# Patient Record
Sex: Male | Born: 1935 | Race: White | Hispanic: No | Smoking: Never smoker
Health system: Southern US, Community
[De-identification: ages and names within clinical notes are randomized; demographics above are authoritative.]

## PROBLEM LIST (undated history)

## (undated) DIAGNOSIS — K219 Gastro-esophageal reflux disease without esophagitis: Secondary | ICD-10-CM

## (undated) DIAGNOSIS — N400 Enlarged prostate without lower urinary tract symptoms: Secondary | ICD-10-CM

## (undated) DIAGNOSIS — D649 Anemia, unspecified: Secondary | ICD-10-CM

## (undated) DIAGNOSIS — N289 Disorder of kidney and ureter, unspecified: Secondary | ICD-10-CM

## (undated) DIAGNOSIS — E785 Hyperlipidemia, unspecified: Secondary | ICD-10-CM

## (undated) DIAGNOSIS — E119 Type 2 diabetes mellitus without complications: Secondary | ICD-10-CM

## (undated) DIAGNOSIS — I447 Left bundle-branch block, unspecified: Secondary | ICD-10-CM

## (undated) DIAGNOSIS — M1990 Unspecified osteoarthritis, unspecified site: Secondary | ICD-10-CM

## (undated) DIAGNOSIS — S2239XA Fracture of one rib, unspecified side, initial encounter for closed fracture: Secondary | ICD-10-CM

## (undated) HISTORY — PX: EYE SURGERY: SHX253

## (undated) HISTORY — PX: OTHER SURGICAL HISTORY: SHX169

---

## 2009-09-28 ENCOUNTER — Ambulatory Visit (HOSPITAL_COMMUNITY): Admission: RE | Admit: 2009-09-28 | Discharge: 2009-09-28 | Payer: Self-pay | Admitting: Ophthalmology

## 2009-10-05 ENCOUNTER — Ambulatory Visit (HOSPITAL_COMMUNITY)
Admission: RE | Admit: 2009-10-05 | Discharge: 2009-10-05 | Payer: Self-pay | Source: Home / Self Care | Admitting: Ophthalmology

## 2010-05-19 LAB — BASIC METABOLIC PANEL
BUN: 16 mg/dL (ref 6–23)
CO2: 24 mEq/L (ref 19–32)
Glucose, Bld: 142 mg/dL — ABNORMAL HIGH (ref 70–99)
Potassium: 4.2 mEq/L (ref 3.5–5.1)
Sodium: 137 mEq/L (ref 135–145)

## 2011-04-29 DIAGNOSIS — S46909A Unspecified injury of unspecified muscle, fascia and tendon at shoulder and upper arm level, unspecified arm, initial encounter: Secondary | ICD-10-CM | POA: Diagnosis not present

## 2011-04-29 DIAGNOSIS — S4980XA Other specified injuries of shoulder and upper arm, unspecified arm, initial encounter: Secondary | ICD-10-CM | POA: Diagnosis not present

## 2011-05-15 DIAGNOSIS — M25519 Pain in unspecified shoulder: Secondary | ICD-10-CM | POA: Diagnosis not present

## 2011-06-29 DIAGNOSIS — L02519 Cutaneous abscess of unspecified hand: Secondary | ICD-10-CM | POA: Diagnosis not present

## 2011-06-29 DIAGNOSIS — M79609 Pain in unspecified limb: Secondary | ICD-10-CM | POA: Diagnosis not present

## 2011-07-30 DIAGNOSIS — M25569 Pain in unspecified knee: Secondary | ICD-10-CM | POA: Diagnosis not present

## 2011-07-30 DIAGNOSIS — M25519 Pain in unspecified shoulder: Secondary | ICD-10-CM | POA: Diagnosis not present

## 2011-08-05 ENCOUNTER — Ambulatory Visit
Admission: RE | Admit: 2011-08-05 | Discharge: 2011-08-05 | Disposition: A | Discharge status: Home / Self Care | Payer: Medicare Other | Source: Ambulatory Visit | Attending: Orthopaedic Surgery | Admitting: Orthopaedic Surgery

## 2011-08-05 ENCOUNTER — Other Ambulatory Visit: Payer: Self-pay | Admitting: Orthopaedic Surgery

## 2011-08-05 DIAGNOSIS — M25519 Pain in unspecified shoulder: Secondary | ICD-10-CM

## 2011-09-04 DIAGNOSIS — H5210 Myopia, unspecified eye: Secondary | ICD-10-CM | POA: Diagnosis not present

## 2011-09-04 DIAGNOSIS — Z961 Presence of intraocular lens: Secondary | ICD-10-CM | POA: Diagnosis not present

## 2011-09-04 DIAGNOSIS — H26499 Other secondary cataract, unspecified eye: Secondary | ICD-10-CM | POA: Diagnosis not present

## 2011-09-04 DIAGNOSIS — H52229 Regular astigmatism, unspecified eye: Secondary | ICD-10-CM | POA: Diagnosis not present

## 2011-09-16 DIAGNOSIS — E782 Mixed hyperlipidemia: Secondary | ICD-10-CM | POA: Diagnosis not present

## 2011-10-11 DIAGNOSIS — N529 Male erectile dysfunction, unspecified: Secondary | ICD-10-CM | POA: Diagnosis not present

## 2011-10-11 DIAGNOSIS — N411 Chronic prostatitis: Secondary | ICD-10-CM | POA: Diagnosis not present

## 2011-11-11 DIAGNOSIS — H26499 Other secondary cataract, unspecified eye: Secondary | ICD-10-CM | POA: Diagnosis not present

## 2012-01-08 DIAGNOSIS — Z23 Encounter for immunization: Secondary | ICD-10-CM | POA: Diagnosis not present

## 2012-03-16 ENCOUNTER — Emergency Department (HOSPITAL_COMMUNITY)
Admission: EM | Admit: 2012-03-16 | Discharge: 2012-03-16 | Disposition: A | Discharge status: Home / Self Care | Payer: Medicare Other | Attending: Emergency Medicine | Admitting: Emergency Medicine

## 2012-03-16 ENCOUNTER — Encounter (HOSPITAL_COMMUNITY): Payer: Self-pay | Admitting: *Deleted

## 2012-03-16 DIAGNOSIS — Z7982 Long term (current) use of aspirin: Secondary | ICD-10-CM | POA: Insufficient documentation

## 2012-03-16 DIAGNOSIS — Z79899 Other long term (current) drug therapy: Secondary | ICD-10-CM | POA: Insufficient documentation

## 2012-03-16 DIAGNOSIS — S61259A Open bite of unspecified finger without damage to nail, initial encounter: Secondary | ICD-10-CM

## 2012-03-16 DIAGNOSIS — IMO0001 Reserved for inherently not codable concepts without codable children: Secondary | ICD-10-CM | POA: Insufficient documentation

## 2012-03-16 DIAGNOSIS — S61209A Unspecified open wound of unspecified finger without damage to nail, initial encounter: Secondary | ICD-10-CM | POA: Insufficient documentation

## 2012-03-16 DIAGNOSIS — Y92009 Unspecified place in unspecified non-institutional (private) residence as the place of occurrence of the external cause: Secondary | ICD-10-CM | POA: Insufficient documentation

## 2012-03-16 DIAGNOSIS — Y939 Activity, unspecified: Secondary | ICD-10-CM | POA: Insufficient documentation

## 2012-03-16 MED ORDER — OXYCODONE-ACETAMINOPHEN 5-325 MG PO TABS
1.0000 | ORAL_TABLET | Freq: Once | ORAL | Status: AC
  Administered 2012-03-16: 1 via ORAL
  Filled 2012-03-16: qty 1

## 2012-03-16 MED ORDER — OXYCODONE-ACETAMINOPHEN 5-325 MG PO TABS: 2.0000 | ORAL_TABLET | ORAL | Status: DC | PRN

## 2012-03-16 MED ORDER — AMOXICILLIN-POT CLAVULANATE 875-125 MG PO TABS: 1.0000 | ORAL_TABLET | Freq: Two times a day (BID) | ORAL | Status: DC

## 2012-03-16 MED ORDER — HYDROCODONE-ACETAMINOPHEN 5-325 MG PO TABS
1.0000 | ORAL_TABLET | Freq: Once | ORAL | Status: AC
  Administered 2012-03-16: 1 via ORAL

## 2012-03-16 MED ORDER — LIDOCAINE HCL (PF) 2 % IJ SOLN
INTRAMUSCULAR | Status: AC
  Administered 2012-03-16: 15:00:00
  Filled 2012-03-16: qty 10

## 2012-03-16 MED ORDER — LIDOCAINE HCL (PF) 2 % IJ SOLN
INTRAMUSCULAR | Status: AC
  Filled 2012-03-16: qty 10

## 2012-03-16 MED ORDER — AMOXICILLIN-POT CLAVULANATE 875-125 MG PO TABS
1.0000 | ORAL_TABLET | Freq: Once | ORAL | Status: AC
  Administered 2012-03-16: 1 via ORAL
  Filled 2012-03-16: qty 1

## 2012-03-16 MED ORDER — HYDROCODONE-ACETAMINOPHEN 5-325 MG PO TABS
ORAL_TABLET | ORAL | Status: AC
  Administered 2012-03-16: 1 via ORAL
  Filled 2012-03-16: qty 1

## 2012-03-16 NOTE — ED Notes (Signed)
[facility] called and stated to have pt call them when he gets home. Pt aware.

## 2012-03-16 NOTE — ED Provider Notes (Signed)
History  This chart was scribed for Donnetta Hutching, MD by Ardeen Jourdain, ED Scribe. This patient was seen in room APA03/APA03 and the patient's care was started at 1307.  CSN: [id number]  Arrival date & time 03/16/12  1136   First MD Initiated Contact with Patient 03/16/12 1307      Chief Complaint  Patient presents with  . Animal Bite     The history is provided by the patient. No language interpreter was used.    Angel Norman is a 79 y.o. male who presents to the Emergency Department complaining of a laceration to his left 5th finger. He states his pet racoon bit him last night. He states his tetanus shot is up to date. He denies any other symptoms at this time. He states the [place] is not feral.      History reviewed. No pertinent past medical history.  History reviewed. No pertinent past surgical history.  No family history on file.  History  Substance Use Topics  . Smoking status: Never Smoker   . Smokeless tobacco: Not on file  . Alcohol Use: No      Review of Systems  Skin: Positive for wound.  All other systems reviewed and are negative.    A complete 10 system review of systems was obtained and all systems are negative except as noted in the HPI and PMH.    Allergies  Review of patient's allergies indicates no known allergies.  Home Medications   Current Outpatient Rx  Name  Route  Sig  Dispense  Refill  . ASPIRIN EC 81 MG PO TBEC   Oral   Take 81 mg by mouth daily.         Marland Kitchen FAMOTIDINE 20 MG PO TABS   Oral   Take 20 mg by mouth 2 (two) times daily.         Marland Kitchen NAPROXEN 500 MG PO TBEC   Oral   Take 500 mg by mouth 2 (two) times daily with a meal.         . PRAVASTATIN SODIUM 40 MG PO TABS   Oral   Take 40 mg by mouth daily.         Marland Kitchen TAMSULOSIN HCL 0.4 MG PO CAPS   Oral   Take 0.4 mg by mouth daily.           Triage Vitals: BP 165/80  Pulse 81  Temp 97.6 F (36.4 C) (Oral)  Resp 20  Ht 6\' 3"  (1.905 m)  Wt 307 lb 6 oz  (139.424 kg)  BMI 38.42 kg/m2  SpO2 96%  Physical Exam  Nursing note and vitals reviewed. Constitutional: He is oriented to person, place, and time. He appears well-developed and well-nourished.  HENT:  Head: Normocephalic and atraumatic.  Eyes: Conjunctivae normal and EOM are normal. Pupils are equal, round, and reactive to light.  Neck: Normal range of motion. Neck supple.  Cardiovascular: Normal rate, regular rhythm and normal heart sounds.   Pulmonary/Chest: Effort normal and breath sounds normal.  Abdominal: Soft. Bowel sounds are normal.  Musculoskeletal: Normal range of motion.  Neurological: He is alert and oriented to person, place, and time.  Skin: Skin is warm and dry.       3 cm elliptical flap to the palmar aspect of the distal phalanx   Psychiatric: He has a normal mood and affect.     ED Course  Procedures (including critical care time)  DIAGNOSTIC STUDIES: Oxygen Saturation is 96%  on room air, adequate by my interpretation.    COORDINATION OF CARE:  1:22 PM: Discussed treatment plan which includes numbing medication and a laceration repair with pt at bedside and pt agreed to plan.    PROCEDURE:  [1]  Digital block of left 5th finger with 2% Xylocaine x8 cc  [2]  LACERATION REPAIR PROCEDURE NOTE The patient's identification was confirmed and consent was obtained. This procedure was performed by Donnetta Hutching, MD at 2:29 PM. Site: Palmer aspect of distal phalanx  Sterile procedures observed Anesthetic used (type and amt): digital block [see above] Suture type/size: 4-0 prolene Length: 3 cm # of Sutures: 4 Technique:interrupted Complexity  complex Antibx ointment applied Tetanus UTD  Site anesthetized, irrigated with NS, explored without evidence of foreign body, wound well approximated, site covered with dry, sterile dressing.  Patient tolerated procedure well without complications. Instructions for care discussed verbally and patient provided  with additional written instructions for homecare and f/u.   Labs Reviewed - No data to display No results found.   No diagnosis found.    MDM  Digital block and laceration repair as above. Will followup with hand surgery following day in [place]      I personally performed the services described in this documentation, which was scribed in my presence. The recorded information has been reviewed and is accurate.    Donnetta Hutching, MD 03/19/12 [phone]

## 2012-03-16 NOTE — ED Notes (Signed)
Pt states pet raccoon bit his 5th finger tip pad today, states tetanus shot up to date, states that raccoon has not had rabies shots, occurred at home per pt-lives on [address] in [place], states raccoon stays in a cage,

## 2012-03-16 NOTE — ED Notes (Signed)
PA aware pt wanting pain med. vo one tab hydrocodone po. Read back and verified.

## 2012-03-16 NOTE — ED Notes (Signed)
Pt states raccoon bit to left 5th finger. Lac to finger. States raccoon has been raised by them since it was a baby and is caged, usually able to pet but states it is mating season.

## 2012-03-19 DIAGNOSIS — S61409A Unspecified open wound of unspecified hand, initial encounter: Secondary | ICD-10-CM | POA: Diagnosis not present

## 2012-04-08 DIAGNOSIS — Z23 Encounter for immunization: Secondary | ICD-10-CM | POA: Diagnosis not present

## 2012-04-08 DIAGNOSIS — E782 Mixed hyperlipidemia: Secondary | ICD-10-CM | POA: Diagnosis not present

## 2012-06-30 DIAGNOSIS — M1710 Unilateral primary osteoarthritis, unspecified knee: Secondary | ICD-10-CM | POA: Diagnosis not present

## 2012-07-06 DIAGNOSIS — H6120 Impacted cerumen, unspecified ear: Secondary | ICD-10-CM | POA: Diagnosis not present

## 2012-07-22 DIAGNOSIS — M1710 Unilateral primary osteoarthritis, unspecified knee: Secondary | ICD-10-CM | POA: Diagnosis not present

## 2012-08-03 DIAGNOSIS — M1710 Unilateral primary osteoarthritis, unspecified knee: Secondary | ICD-10-CM | POA: Diagnosis not present

## 2012-08-10 ENCOUNTER — Other Ambulatory Visit (HOSPITAL_COMMUNITY): Payer: Self-pay | Admitting: Orthopedic Surgery

## 2012-08-12 ENCOUNTER — Encounter (HOSPITAL_COMMUNITY): Payer: Self-pay | Admitting: Pharmacy Technician

## 2012-08-17 NOTE — Pre-Procedure Instructions (Signed)
Trejuan Matherne [facility]  08/17/2012   Your procedure is scheduled on:  Friday, June 18th   Report to [facility] Short Stay Center at 6:30 AM.              Bonita Quin will come through Entrance "A", follow signs to [facility] and take those to 3rd floor.   Call this number if you have problems the morning of surgery: [phone]   Remember:   Do not eat food or drink liquids after midnight Thursday.   Take these medicines the morning of surgery with A SIP OF WATER: Pepcid, Robaxin, Oxycodone, Flomax   Do not wear jewelry/  Do not wear lotions, powders, or colognes. You may NOT wear deodorant.   Men may shave face and neck.   Do not bring valuables to the hospital.  [facility] is not responsible for any belongings or valuables.  Contacts, dentures or bridgework may not be worn into surgery.   Leave suitcase in the car. After surgery it may be brought to your room.  For patients admitted to the hospital, checkout time is 11:00 AM the day of discharge.   Name and phone number of your driver:                  Special Instructions: Shower using CHG 2 nights before surgery and the night before surgery.  If you shower the day of surgery use CHG.  Use special wash - you have one bottle of CHG for all showers.  You should use approximately 1/3 of the bottle for each shower.   Please read over the following fact sheets that you were given: Pain Booklet, MRSA Information and Surgical Site Infection Prevention

## 2012-08-18 ENCOUNTER — Encounter (HOSPITAL_COMMUNITY): Payer: Self-pay

## 2012-08-18 ENCOUNTER — Encounter (HOSPITAL_COMMUNITY)
Admission: RE | Admit: 2012-08-18 | Discharge: 2012-08-18 | Disposition: A | Discharge status: Home / Self Care | Payer: Medicare Other | Source: Ambulatory Visit | Attending: Orthopedic Surgery | Admitting: Orthopedic Surgery

## 2012-08-18 DIAGNOSIS — Z6838 Body mass index (BMI) 38.0-38.9, adult: Secondary | ICD-10-CM | POA: Diagnosis not present

## 2012-08-18 DIAGNOSIS — G8918 Other acute postprocedural pain: Secondary | ICD-10-CM | POA: Diagnosis not present

## 2012-08-18 DIAGNOSIS — K219 Gastro-esophageal reflux disease without esophagitis: Secondary | ICD-10-CM | POA: Diagnosis present

## 2012-08-18 DIAGNOSIS — Z01810 Encounter for preprocedural cardiovascular examination: Secondary | ICD-10-CM | POA: Diagnosis not present

## 2012-08-18 DIAGNOSIS — M1710 Unilateral primary osteoarthritis, unspecified knee: Secondary | ICD-10-CM | POA: Diagnosis not present

## 2012-08-18 DIAGNOSIS — E785 Hyperlipidemia, unspecified: Secondary | ICD-10-CM | POA: Diagnosis present

## 2012-08-18 DIAGNOSIS — Z01818 Encounter for other preprocedural examination: Secondary | ICD-10-CM | POA: Diagnosis not present

## 2012-08-18 DIAGNOSIS — Z01812 Encounter for preprocedural laboratory examination: Secondary | ICD-10-CM | POA: Diagnosis not present

## 2012-08-18 HISTORY — DX: Unspecified osteoarthritis, unspecified site: M19.90

## 2012-08-18 HISTORY — PX: TOTAL KNEE ARTHROPLASTY: SHX125

## 2012-08-18 HISTORY — DX: Gastro-esophageal reflux disease without esophagitis: K21.9

## 2012-08-18 HISTORY — DX: Hyperlipidemia, unspecified: E78.5

## 2012-08-18 LAB — CBC
HCT: 38.4 % — ABNORMAL LOW (ref 39.0–52.0)
Hemoglobin: 12.3 g/dL — ABNORMAL LOW (ref 13.0–17.0)
MCH: 26.4 pg (ref 26.0–34.0)
MCHC: 32 g/dL (ref 30.0–36.0)
MCV: 82.4 fL (ref 78.0–100.0)

## 2012-08-18 LAB — COMPREHENSIVE METABOLIC PANEL
Alkaline Phosphatase: 80 U/L (ref 39–117)
BUN: 22 mg/dL (ref 6–23)
Calcium: 9 mg/dL (ref 8.4–10.5)
GFR calc Af Amer: 64 mL/min — ABNORMAL LOW (ref >90)
Glucose, Bld: 105 mg/dL — ABNORMAL HIGH (ref 70–99)
Total Protein: 7.3 g/dL (ref 6.0–8.3)

## 2012-08-18 LAB — SURGICAL PCR SCREEN
MRSA, PCR: NEGATIVE
Staphylococcus aureus: NEGATIVE

## 2012-08-18 LAB — TYPE AND SCREEN
ABO/RH(D): O POS
Antibody Screen: NEGATIVE

## 2012-08-18 LAB — PROTIME-INR: Prothrombin Time: 13.2 seconds (ref 11.6–15.2)

## 2012-08-18 LAB — APTT: aPTT: 30 seconds (ref 24–37)

## 2012-08-18 LAB — ABO/RH: ABO/RH(D): O POS

## 2012-08-18 IMAGING — CR DG CHEST 2V
2 series · 2 of 2 positions shown · non-contrast
Comparison: None.

CLINICAL DATA: Preop left knee replacement

CHEST - 2 VIEW

[view not recorded (1 of 2)]
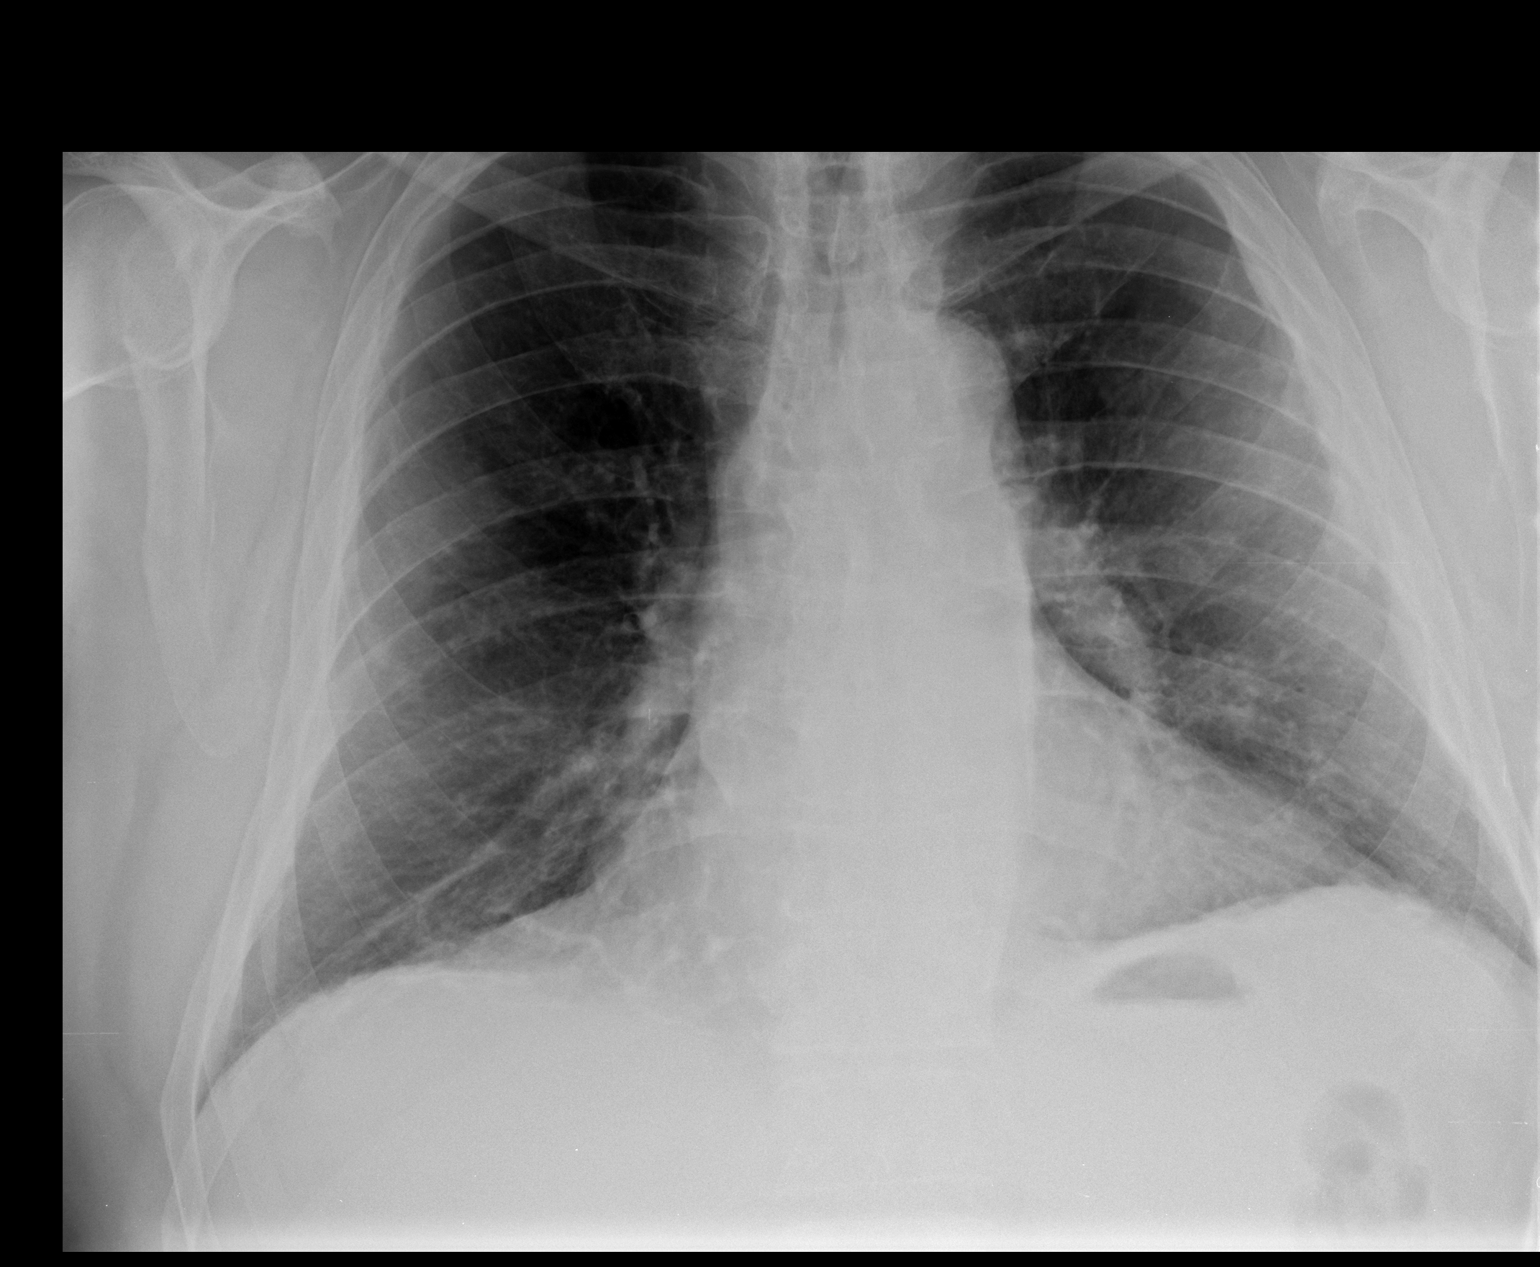

[view not recorded (2 of 2)]
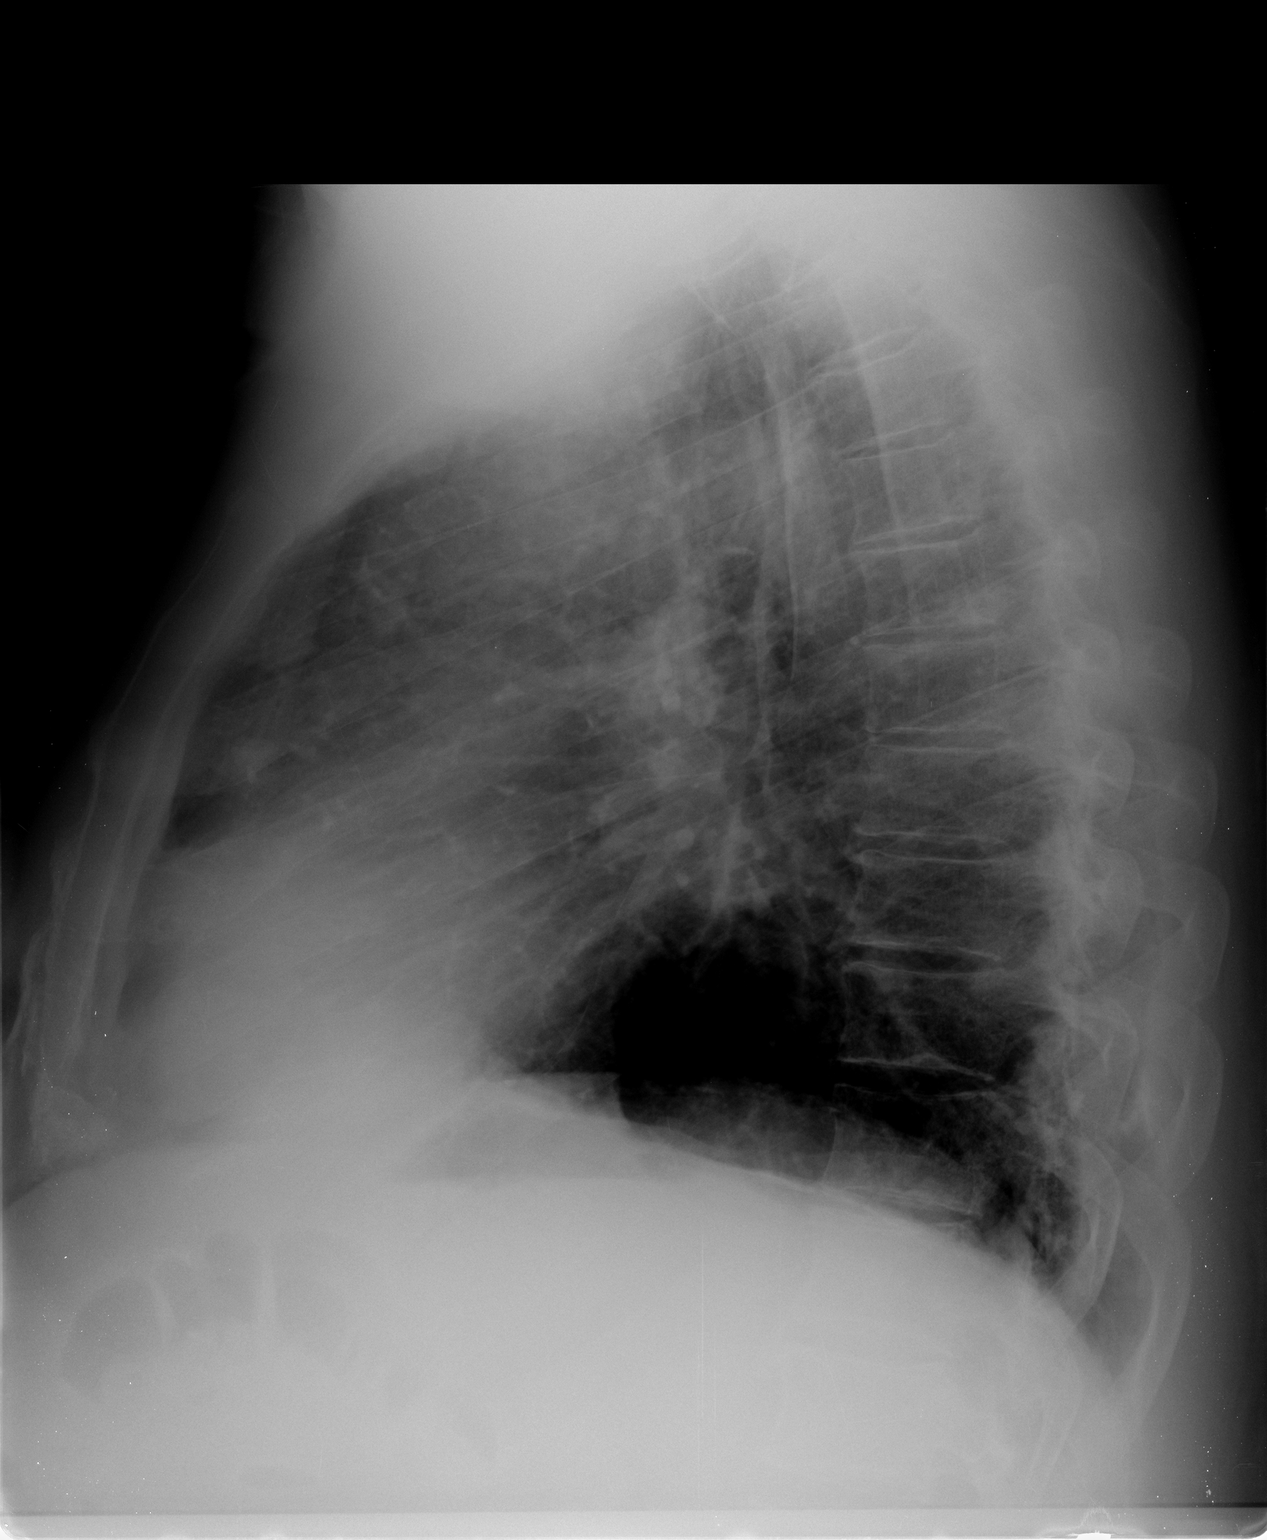

[2 of 2 positions shown; findings below may reference images not displayed]

FINDINGS: The heart size and vascular pattern are normal.  The
lungs are clear.  There is no evidence of consolidation or pleural
effusion.
IMPRESSION: No acute abnormalities

## 2012-08-18 MED ORDER — CEFAZOLIN SODIUM 10 G IJ SOLR
3.0000 g | INTRAMUSCULAR | Status: AC
  Administered 2012-08-19: 3 g via INTRAVENOUS
  Filled 2012-08-18: qty 3000

## 2012-08-18 NOTE — Pre-Procedure Instructions (Signed)
Tallie Hevia [facility]  08/18/2012   Your procedure is scheduled on:  Wednesday, June 18th   Report to [facility] Short Stay Center at 6:30 AM.              Bonita Quin will come through Entrance "A", follow signs to [facility] and take those to 3rd floor.   Call this number if you have problems the morning of surgery: [phone]   Remember:   Do not eat food or drink liquids after midnight Thursday.   Take these medicines the morning of surgery with A SIP OF WATER: Famotidine (Pepcid), Oxycodone (Percocet) if needed for pain, and Tamsulosin (Flomax)   Do not wear jewelry  Do not wear lotions, powders, or colognes. You may NOT wear deodorant.   Men may shave face and neck.  Do not bring valuables to the hospital.  [facility] is not responsible for any belongings or  valuables.  Contacts, dentures or bridgework may not be worn into surgery.  Leave suitcase in the car. After surgery it may be brought to your room.  For patients admitted to the hospital, checkout time is 11:00 AM the day of discharge.   Name and phone number of your driver:                  Special Instructions: Shower using CHG tonight and tomorrow morning before surgery.   Use special wash - you have one bottle of CHG for all showers.  You should use approximately 1/3 of the bottle for each shower.   Please read over the following fact sheets that you were given: Pain Booklet, MRSA Information and Surgical Site Infection Prevention

## 2012-08-18 NOTE — Progress Notes (Signed)
08/18/12 1404  OBSTRUCTIVE SLEEP APNEA  Have you ever been diagnosed with sleep apnea through a sleep study? No  Do you snore loudly (loud enough to be heard through closed doors)?  0  Do you often feel tired, fatigued, or sleepy during the daytime? 0  Has anyone observed you stop breathing during your sleep? 0  Do you have, or are you being treated for high blood pressure? 0  BMI more than 35 kg/m2? 1  Age over 77 years old? 1  Neck circumference greater than 40 cm/18 inches? 1  Gender: 1  Obstructive Sleep Apnea Score 4  Score 4 or greater  Results sent to PCP

## 2012-08-18 NOTE — Progress Notes (Signed)
Primary physician - dr. Philipp Deputy No recent cardiac testing - no cardiologist

## 2012-08-18 NOTE — Pre-Procedure Instructions (Signed)
Angel Norman [facility]  08/18/2012   Your procedure is scheduled on:  Friday, June 18th   Report to [facility] Short Stay Center at 6:30 AM.              Bonita Quin will come through Entrance "A", follow signs to [facility] and take those to 3rd floor.   Call this number if you have problems the morning of surgery: [phone]   Remember:   Do not eat food or drink liquids after midnight Thursday.   Take these medicines the morning of surgery with A SIP OF WATER: Famotidine (Pepcid), Oxycodone (Percocet) if needed for pain, and Tamsulosin (Flomax)   Do not wear jewelry  Do not wear lotions, powders, or colognes. You may NOT wear deodorant.   Men may shave face and neck.  Do not bring valuables to the hospital.  [facility] is not responsible for any belongings or  valuables.  Contacts, dentures or bridgework may not be worn into surgery.  Leave suitcase in the car. After surgery it may be brought to your room.  For patients admitted to the hospital, checkout time is 11:00 AM the day of discharge.   Name and phone number of your driver:                  Special Instructions: Shower using CHG tonight and tomorrow morning before surgery.   Use special wash - you have one bottle of CHG for all showers.  You should use approximately 1/3 of the bottle for each shower.   Please read over the following fact sheets that you were given: Pain Booklet, MRSA Information and Surgical Site Infection Prevention

## 2012-08-19 ENCOUNTER — Encounter (HOSPITAL_COMMUNITY): Payer: Self-pay | Admitting: *Deleted

## 2012-08-19 ENCOUNTER — Inpatient Hospital Stay (HOSPITAL_COMMUNITY): Payer: Medicare Other | Admitting: Certified Registered"

## 2012-08-19 ENCOUNTER — Inpatient Hospital Stay (HOSPITAL_COMMUNITY)
Admission: RE | Admit: 2012-08-19 | Discharge: 2012-08-22 | DRG: 470 | Disposition: A | Discharge status: Home Health | Payer: Medicare Other | Source: Ambulatory Visit | Attending: Orthopedic Surgery | Admitting: Orthopedic Surgery

## 2012-08-19 ENCOUNTER — Encounter (HOSPITAL_COMMUNITY)
Admission: RE | Disposition: A | Discharge status: Home Health | Payer: Self-pay | Source: Ambulatory Visit | Attending: Orthopedic Surgery

## 2012-08-19 ENCOUNTER — Encounter (HOSPITAL_COMMUNITY): Payer: Self-pay | Admitting: Certified Registered"

## 2012-08-19 DIAGNOSIS — M1710 Unilateral primary osteoarthritis, unspecified knee: Principal | ICD-10-CM | POA: Diagnosis present

## 2012-08-19 DIAGNOSIS — Z01810 Encounter for preprocedural cardiovascular examination: Secondary | ICD-10-CM

## 2012-08-19 DIAGNOSIS — Z01812 Encounter for preprocedural laboratory examination: Secondary | ICD-10-CM

## 2012-08-19 DIAGNOSIS — Z01818 Encounter for other preprocedural examination: Secondary | ICD-10-CM

## 2012-08-19 DIAGNOSIS — E785 Hyperlipidemia, unspecified: Secondary | ICD-10-CM | POA: Diagnosis present

## 2012-08-19 DIAGNOSIS — Z96652 Presence of left artificial knee joint: Secondary | ICD-10-CM

## 2012-08-19 DIAGNOSIS — Z6838 Body mass index (BMI) 38.0-38.9, adult: Secondary | ICD-10-CM

## 2012-08-19 DIAGNOSIS — K219 Gastro-esophageal reflux disease without esophagitis: Secondary | ICD-10-CM | POA: Diagnosis present

## 2012-08-19 HISTORY — PX: TOTAL KNEE ARTHROPLASTY: SHX125

## 2012-08-19 SURGERY — ARTHROPLASTY, KNEE, TOTAL
Anesthesia: General | Site: Knee | Laterality: Left | Wound class: Clean

## 2012-08-19 MED ORDER — DOCUSATE SODIUM 100 MG PO CAPS
100.0000 mg | ORAL_CAPSULE | Freq: Two times a day (BID) | ORAL | Status: DC
  Administered 2012-08-19 – 2012-08-22 (×7): 100 mg via ORAL
  Filled 2012-08-19 (×8): qty 1

## 2012-08-19 MED ORDER — PHENOL 1.4 % MT LIQD: 1.0000 | OROMUCOSAL | Status: DC | PRN

## 2012-08-19 MED ORDER — CEFAZOLIN SODIUM-DEXTROSE 2-3 GM-% IV SOLR
2.0000 g | Freq: Four times a day (QID) | INTRAVENOUS | Status: AC
  Administered 2012-08-19 (×2): 2 g via INTRAVENOUS
  Filled 2012-08-19 (×3): qty 50

## 2012-08-19 MED ORDER — HYDROMORPHONE HCL PF 1 MG/ML IJ SOLN
1.0000 mg | INTRAMUSCULAR | Status: DC | PRN
  Administered 2012-08-19 – 2012-08-21 (×7): 1 mg via INTRAVENOUS
  Filled 2012-08-19 (×7): qty 1

## 2012-08-19 MED ORDER — SIMVASTATIN 5 MG PO TABS
5.0000 mg | ORAL_TABLET | Freq: Every day | ORAL | Status: DC
  Administered 2012-08-19 – 2012-08-21 (×3): 5 mg via ORAL
  Filled 2012-08-19 (×4): qty 1

## 2012-08-19 MED ORDER — PROPOFOL 10 MG/ML IV BOLUS
INTRAVENOUS | Status: DC | PRN
  Administered 2012-08-19: 150 mg via INTRAVENOUS

## 2012-08-19 MED ORDER — MAGNESIUM CITRATE PO SOLN
1.0000 | Freq: Once | ORAL | Status: AC | PRN
  Filled 2012-08-19: qty 296

## 2012-08-19 MED ORDER — ASPIRIN EC 325 MG PO TBEC
325.0000 mg | DELAYED_RELEASE_TABLET | Freq: Every day | ORAL | Status: DC
  Administered 2012-08-20 – 2012-08-22 (×3): 325 mg via ORAL
  Filled 2012-08-19 (×5): qty 1

## 2012-08-19 MED ORDER — METOCLOPRAMIDE HCL 5 MG/ML IJ SOLN
5.0000 mg | Freq: Three times a day (TID) | INTRAMUSCULAR | Status: DC | PRN
  Filled 2012-08-19: qty 2

## 2012-08-19 MED ORDER — OXYCODONE HCL 5 MG/5ML PO SOLN: 5.0000 mg | Freq: Once | ORAL | Status: DC | PRN

## 2012-08-19 MED ORDER — ONDANSETRON HCL 4 MG PO TABS: 4.0000 mg | ORAL_TABLET | Freq: Four times a day (QID) | ORAL | Status: DC | PRN

## 2012-08-19 MED ORDER — SENNOSIDES-DOCUSATE SODIUM 8.6-50 MG PO TABS: 1.0000 | ORAL_TABLET | Freq: Every evening | ORAL | Status: DC | PRN

## 2012-08-19 MED ORDER — MEPERIDINE HCL 25 MG/ML IJ SOLN: 6.2500 mg | INTRAMUSCULAR | Status: DC | PRN

## 2012-08-19 MED ORDER — BUPIVACAINE-EPINEPHRINE PF 0.5-1:200000 % IJ SOLN
INTRAMUSCULAR | Status: DC | PRN
  Administered 2012-08-19: 30 mL

## 2012-08-19 MED ORDER — PHENYLEPHRINE HCL 10 MG/ML IJ SOLN
INTRAMUSCULAR | Status: DC | PRN
  Administered 2012-08-19: 80 ug via INTRAVENOUS

## 2012-08-19 MED ORDER — SODIUM CHLORIDE 0.9 % IR SOLN
Status: DC | PRN
  Administered 2012-08-19: 3000 mL

## 2012-08-19 MED ORDER — ONDANSETRON HCL 4 MG/2ML IJ SOLN
4.0000 mg | Freq: Four times a day (QID) | INTRAMUSCULAR | Status: DC | PRN
  Administered 2012-08-20: 4 mg via INTRAVENOUS
  Filled 2012-08-19: qty 2

## 2012-08-19 MED ORDER — TAMSULOSIN HCL 0.4 MG PO CAPS
0.4000 mg | ORAL_CAPSULE | Freq: Every day | ORAL | Status: DC
  Administered 2012-08-19 – 2012-08-21 (×3): 0.4 mg via ORAL
  Filled 2012-08-19 (×4): qty 1

## 2012-08-19 MED ORDER — SODIUM CHLORIDE 0.9 % IV SOLN
INTRAVENOUS | Status: DC
  Administered 2012-08-19: 20 mL/h via INTRAVENOUS
  Administered 2012-08-20: 15:00:00 via INTRAVENOUS

## 2012-08-19 MED ORDER — ONDANSETRON HCL 4 MG/2ML IJ SOLN
INTRAMUSCULAR | Status: DC | PRN
  Administered 2012-08-19: 4 mg via INTRAVENOUS

## 2012-08-19 MED ORDER — BISACODYL 5 MG PO TBEC: 5.0000 mg | DELAYED_RELEASE_TABLET | Freq: Every day | ORAL | Status: DC | PRN

## 2012-08-19 MED ORDER — FAMOTIDINE 20 MG PO TABS
20.0000 mg | ORAL_TABLET | Freq: Two times a day (BID) | ORAL | Status: DC
  Administered 2012-08-19 – 2012-08-21 (×6): 20 mg via ORAL
  Filled 2012-08-19 (×8): qty 1

## 2012-08-19 MED ORDER — OXYCODONE HCL 5 MG PO TABS: 5.0000 mg | ORAL_TABLET | Freq: Once | ORAL | Status: DC | PRN

## 2012-08-19 MED ORDER — ACETAMINOPHEN 325 MG PO TABS
650.0000 mg | ORAL_TABLET | Freq: Four times a day (QID) | ORAL | Status: DC | PRN
  Administered 2012-08-19 – 2012-08-20 (×2): 650 mg via ORAL
  Filled 2012-08-19 (×2): qty 2

## 2012-08-19 MED ORDER — HYDROCODONE-ACETAMINOPHEN 5-325 MG PO TABS
1.0000 | ORAL_TABLET | ORAL | Status: DC | PRN
  Administered 2012-08-19 – 2012-08-20 (×2): 2 via ORAL
  Administered 2012-08-22: 1 via ORAL
  Filled 2012-08-19 (×3): qty 2

## 2012-08-19 MED ORDER — ACETAMINOPHEN 650 MG RE SUPP: 650.0000 mg | Freq: Four times a day (QID) | RECTAL | Status: DC | PRN

## 2012-08-19 MED ORDER — LIDOCAINE HCL (CARDIAC) 20 MG/ML IV SOLN
INTRAVENOUS | Status: DC | PRN
  Administered 2012-08-19: 70 mg via INTRAVENOUS

## 2012-08-19 MED ORDER — FENTANYL CITRATE 0.05 MG/ML IJ SOLN
INTRAMUSCULAR | Status: DC | PRN
  Administered 2012-08-19: 50 ug via INTRAVENOUS
  Administered 2012-08-19: 100 ug via INTRAVENOUS

## 2012-08-19 MED ORDER — METHOCARBAMOL 500 MG PO TABS
500.0000 mg | ORAL_TABLET | Freq: Two times a day (BID) | ORAL | Status: DC | PRN
  Administered 2012-08-20 – 2012-08-21 (×3): 500 mg via ORAL
  Filled 2012-08-19 (×4): qty 1

## 2012-08-19 MED ORDER — LACTATED RINGERS IV SOLN
INTRAVENOUS | Status: DC | PRN
  Administered 2012-08-19 (×2): via INTRAVENOUS

## 2012-08-19 MED ORDER — HYDROMORPHONE HCL PF 1 MG/ML IJ SOLN
INTRAMUSCULAR | Status: AC
  Filled 2012-08-19: qty 1

## 2012-08-19 MED ORDER — MENTHOL 3 MG MT LOZG: 1.0000 | LOZENGE | OROMUCOSAL | Status: DC | PRN

## 2012-08-19 MED ORDER — HYDROMORPHONE HCL PF 1 MG/ML IJ SOLN
0.2500 mg | INTRAMUSCULAR | Status: DC | PRN
  Administered 2012-08-19 (×4): 0.25 mg via INTRAVENOUS

## 2012-08-19 MED ORDER — METOCLOPRAMIDE HCL 10 MG PO TABS: 5.0000 mg | ORAL_TABLET | Freq: Three times a day (TID) | ORAL | Status: DC | PRN

## 2012-08-19 MED ORDER — ONDANSETRON HCL 4 MG/2ML IJ SOLN: 4.0000 mg | Freq: Once | INTRAMUSCULAR | Status: DC | PRN

## 2012-08-19 MED ORDER — CELECOXIB 200 MG PO CAPS
200.0000 mg | ORAL_CAPSULE | Freq: Two times a day (BID) | ORAL | Status: DC
  Administered 2012-08-19 – 2012-08-21 (×6): 200 mg via ORAL
  Filled 2012-08-19 (×8): qty 1

## 2012-08-19 MED ORDER — MIDAZOLAM HCL 5 MG/5ML IJ SOLN
INTRAMUSCULAR | Status: DC | PRN
  Administered 2012-08-19: 2 mg via INTRAVENOUS

## 2012-08-19 SURGICAL SUPPLY — 55 items
BLADE SAGITTAL 25.0X1.27X90 (BLADE) ×2 IMPLANT
BLADE SAW SGTL 13.0X1.19X90.0M (BLADE) ×2 IMPLANT
BLADE SURG 21 STRL SS (BLADE) ×4 IMPLANT
BNDG COHESIVE 6X5 TAN STRL LF (GAUZE/BANDAGES/DRESSINGS) ×2 IMPLANT
BONE CEMENT PALACOSE (Orthopedic Implant) ×4 IMPLANT
BOWL SMART MIX CTS (DISPOSABLE) ×2 IMPLANT
CAP FLEX FEMUR MOB CROSSLINK ×2 IMPLANT
CEMENT BONE PALACOSE (Orthopedic Implant) ×2 IMPLANT
CLOTH BEACON ORANGE TIMEOUT ST (SAFETY) ×2 IMPLANT
COVER SURGICAL LIGHT HANDLE (MISCELLANEOUS) ×2 IMPLANT
CUFF TOURNIQUET SINGLE 34IN LL (TOURNIQUET CUFF) ×2 IMPLANT
CUFF TOURNIQUET SINGLE 44IN (TOURNIQUET CUFF) IMPLANT
DRAPE EXTREMITY T 121X128X90 (DRAPE) ×2 IMPLANT
DRAPE PROXIMA HALF (DRAPES) ×2 IMPLANT
DRAPE U-SHAPE 47X51 STRL (DRAPES) ×2 IMPLANT
DRSG ADAPTIC 3X8 NADH LF (GAUZE/BANDAGES/DRESSINGS) ×2 IMPLANT
DRSG PAD ABDOMINAL 8X10 ST (GAUZE/BANDAGES/DRESSINGS) ×2 IMPLANT
DURAPREP 26ML APPLICATOR (WOUND CARE) ×2 IMPLANT
ELECT REM PT RETURN 9FT ADLT (ELECTROSURGICAL) ×2
ELECTRODE REM PT RTRN 9FT ADLT (ELECTROSURGICAL) ×1 IMPLANT
FACESHIELD LNG OPTICON STERILE (SAFETY) ×4 IMPLANT
GLOVE BIO SURGEON STRL SZ 6.5 (GLOVE) ×2 IMPLANT
GLOVE BIO SURGEON STRL SZ7.5 (GLOVE) ×4 IMPLANT
GLOVE BIOGEL PI IND STRL 7.5 (GLOVE) ×1 IMPLANT
GLOVE BIOGEL PI IND STRL 9 (GLOVE) ×1 IMPLANT
GLOVE BIOGEL PI INDICATOR 7.5 (GLOVE) ×1
GLOVE BIOGEL PI INDICATOR 9 (GLOVE) ×1
GLOVE NEODERM STER SZ 7 (GLOVE) ×2 IMPLANT
GLOVE SURG ORTHO 9.0 STRL STRW (GLOVE) ×6 IMPLANT
GOWN PREVENTION PLUS XLARGE (GOWN DISPOSABLE) ×2 IMPLANT
GOWN SRG XL XLNG 56XLVL 4 (GOWN DISPOSABLE) ×3 IMPLANT
GOWN STRL NON-REIN XL XLG LVL4 (GOWN DISPOSABLE) ×6
HANDPIECE INTERPULSE COAX TIP (DISPOSABLE) ×2
KIT BASIN OR (CUSTOM PROCEDURE TRAY) ×2 IMPLANT
KIT ROOM TURNOVER OR (KITS) ×2 IMPLANT
MANIFOLD NEPTUNE II (INSTRUMENTS) ×4 IMPLANT
NEEDLE SPNL 18GX3.5 QUINCKE PK (NEEDLE) ×2 IMPLANT
NS IRRIG 1000ML POUR BTL (IV SOLUTION) ×2 IMPLANT
PACK TOTAL JOINT (CUSTOM PROCEDURE TRAY) ×2 IMPLANT
PAD ARMBOARD 7.5X6 YLW CONV (MISCELLANEOUS) ×4 IMPLANT
PADDING CAST COTTON 6X4 STRL (CAST SUPPLIES) ×2 IMPLANT
SET HNDPC FAN SPRY TIP SCT (DISPOSABLE) ×1 IMPLANT
SLEEVE SURGEON STRL (DRAPES) ×2 IMPLANT
SPONGE GAUZE 4X4 12PLY (GAUZE/BANDAGES/DRESSINGS) ×2 IMPLANT
STAPLER VISISTAT 35W (STAPLE) ×2 IMPLANT
SUCTION FRAZIER TIP 10 FR DISP (SUCTIONS) ×2 IMPLANT
SUT VIC AB 0 CTB1 27 (SUTURE) ×4 IMPLANT
SUT VIC AB 1 CTX 36 (SUTURE) ×2
SUT VIC AB 1 CTX36XBRD ANBCTR (SUTURE) ×1 IMPLANT
TOWEL OR 17X24 6PK STRL BLUE (TOWEL DISPOSABLE) ×2 IMPLANT
TOWEL OR 17X26 10 PK STRL BLUE (TOWEL DISPOSABLE) ×2 IMPLANT
TRAY FOLEY CATH 14FR (SET/KITS/TRAYS/PACK) IMPLANT
WATER STERILE IRR 1000ML POUR (IV SOLUTION) ×2 IMPLANT
WRAP KNEE MAXI GEL POST OP (GAUZE/BANDAGES/DRESSINGS) ×2 IMPLANT
YANKAUER SUCT BULB TIP NO VENT (SUCTIONS) ×2 IMPLANT

## 2012-08-19 NOTE — Anesthesia Procedure Notes (Addendum)
Anesthesia Regional Block:  Femoral nerve block  Pre-Anesthetic Checklist: ,, timeout performed, Correct Patient, Correct Site, Correct Laterality, Correct Procedure, Correct Position, site marked, Risks and benefits discussed,  Surgical consent,  Pre-op evaluation,  At surgeon's request and post-op pain management  Laterality: Left  Prep: chloraprep       Needles:  Injection technique: Single-shot  Needle Type: Echogenic Stimulator Needle          Additional Needles:  Procedures: ultrasound guided (picture in chart) and nerve stimulator Femoral nerve block  Nerve Stimulator or Paresthesia:  Response: 0.4 mA,   Additional Responses:   Narrative:  Start time: 08/19/2012 8:07 AM End time: 08/19/2012 8:22 AM Injection made incrementally with aspirations every 5 mL.  Performed by: Personally  Anesthesiologist: Arta Bruce MD  Additional Notes: Monitors applied. Patient sedated. Sterile prep and drape,hand hygiene and sterile gloves were used. Relevant anatomy identified.Needle position confirmed.Local anesthetic injected incrementally after negative aspiration. Local anesthetic spread visualized around nerve(s). Vascular puncture avoided. No complications. Image printed for medical record.The patient tolerated the procedure well.       Femoral nerve block Procedure Name: LMA Insertion Date/Time: 08/19/2012 8:44 AM Performed by: Arlice Colt B Pre-anesthesia Checklist: Patient identified, Emergency Drugs available, Suction available, Patient being monitored and Timeout performed Patient Re-evaluated:Patient Re-evaluated prior to inductionOxygen Delivery Method: Circle system utilized Preoxygenation: Pre-oxygenation with 100% oxygen Intubation Type: IV induction LMA: LMA inserted LMA Size: 5.0 Number of attempts: 1 Placement Confirmation: positive ETCO2 and breath sounds checked- equal and bilateral Tube secured with: Tape Dental Injury: Teeth and Oropharynx as per  pre-operative assessment

## 2012-08-19 NOTE — H&P (Signed)
TOTAL KNEE ADMISSION H&P  Patient is being admitted for left total knee arthroplasty.  Subjective:  Chief Complaint:left knee pain.  HPI: Angel Norman, 79 y.o. male, has a history of pain and functional disability in the left knee due to arthritis and has failed non-surgical conservative treatments for greater than 12 weeks to includeNSAID's and/or analgesics, corticosteriod injections, use of assistive devices and activity modification.  Onset of symptoms was gradual, starting 8 years ago with gradually worsening course since that time. The patient noted no past surgery on the left knee(s).  Patient currently rates pain in the left knee(s) at 8 out of 10 with activity. Patient has night pain, worsening of pain with activity and weight bearing, pain that interferes with activities of daily living, pain with passive range of motion, crepitus and joint swelling.  Patient has evidence of subchondral sclerosis, periarticular osteophytes and joint space narrowing by imaging studies. This patient has had Progressive osteoarthritis. There is no active infection.  There are no active problems to display for this patient.  Past Medical History  Diagnosis Date  . GERD (gastroesophageal reflux disease)   . Hyperlipidemia   . Arthritis     Past Surgical History  Procedure Laterality Date  . Goiter removal      No prescriptions prior to admission   No Known Allergies  History  Substance Use Topics  . Smoking status: Never Smoker   . Smokeless tobacco: Not on file  . Alcohol Use: No    No family history on file.   Review of Systems  All other systems reviewed and are negative.    Objective:  Physical Exam  Vital signs in last 24 hours: Temp:  [97.7 F (36.5 C)] 97.7 F (36.5 C) (06/17 1400) Pulse Rate:  [90] 90 (06/17 1400) Resp:  [20] 20 (06/17 1400) BP: (133)/(82) 133/82 mmHg (06/17 1400) SpO2:  [95 %] 95 % (06/17 1400) Weight:  [138.03 kg (304 lb 4.8 oz)] 138.03 kg (304 lb 4.8  oz) (06/17 1400)  Labs:   Estimated body mass index is 38.42 kg/(m^2) as calculated from the following:   Height as of 03/16/12: 6\' 3"  (1.905 m).   Weight as of 03/16/12: 139.424 kg (307 lb 6 oz).   Imaging Review Plain radiographs demonstrate moderate degenerative joint disease of the left knee(s). The overall alignment ismild varus. The bone quality appears to be adequate for age and reported activity level.  Assessment/Plan:  End stage arthritis, left knee   The patient history, physical examination, clinical judgment of the provider and imaging studies are consistent with end stage degenerative joint disease of the left knee(s) and total knee arthroplasty is deemed medically necessary. The treatment options including medical management, injection therapy arthroscopy and arthroplasty were discussed at length. The risks and benefits of total knee arthroplasty were presented and reviewed. The risks due to aseptic loosening, infection, stiffness, patella tracking problems, thromboembolic complications and other imponderables were discussed. The patient acknowledged the explanation, agreed to proceed with the plan and consent was signed. Patient is being admitted for inpatient treatment for surgery, pain control, PT, OT, prophylactic antibiotics, VTE prophylaxis, progressive ambulation and ADL's and discharge planning. The patient is planning to be discharged home with home health services

## 2012-08-19 NOTE — Transfer of Care (Signed)
Immediate Anesthesia Transfer of Care Note  Patient: Angel Norman [facility]  Procedure(s) Performed: Procedure(s) with comments: TOTAL KNEE ARTHROPLASTY (Left) - Left Total Knee Arthroplasty  Patient Location: PACU  Anesthesia Type:General  Level of Consciousness: awake, alert  and oriented  Airway & Oxygen Therapy: Patient Spontanous Breathing and Patient connected to face mask oxygen  Post-op Assessment: Report given to PACU RN and Post -op Vital signs reviewed and stable  Post vital signs: Reviewed and stable  Complications: No apparent anesthesia complications

## 2012-08-19 NOTE — Anesthesia Preprocedure Evaluation (Addendum)
Anesthesia Evaluation  Patient identified by MRN, date of birth, ID band Patient awake    Reviewed: Allergy & Precautions, H&P , NPO status , Patient's Chart, lab work & pertinent test results  Airway Mallampati: II TM Distance: >3 FB Neck ROM: Full    Dental  (+) Edentulous Upper and Edentulous Lower   Pulmonary          Cardiovascular Rhythm:Regular Rate:Normal     Neuro/Psych    GI/Hepatic GERD-  Controlled,  Endo/Other  Morbid obesity  Renal/GU      Musculoskeletal   Abdominal   Peds  Hematology   Anesthesia Other Findings   Reproductive/Obstetrics                          Anesthesia Physical Anesthesia Plan  ASA: II  Anesthesia Plan: General   Post-op Pain Management:    Induction: Intravenous  Airway Management Planned: LMA  Additional Equipment:   Intra-op Plan:   Post-operative Plan: Extubation in OR  Informed Consent: I have reviewed the patients History and Physical, chart, labs and discussed the procedure including the risks, benefits and alternatives for the proposed anesthesia with the patient or authorized representative who has indicated his/her understanding and acceptance.     Plan Discussed with: CRNA, Surgeon and Anesthesiologist  Anesthesia Plan Comments:        Anesthesia Quick Evaluation

## 2012-08-19 NOTE — Op Note (Signed)
OPERATIVE REPORT  DATE OF SURGERY: 08/19/2012  PATIENT:  Angel Norman,  79 y.o. male  PRE-OPERATIVE DIAGNOSIS:  Osteoarthritis Left Knee  POST-OPERATIVE DIAGNOSIS:  Osteoarthritis Left Knee  PROCEDURE:  Procedure(s): TOTAL KNEE ARTHROPLASTY Zimmer components. Size G femur. Size 6 tibia. 10 mm rotating platform polyethylene tray. 32 mm patella.  SURGEON:  Surgeon(s): Nadara Mustard, MD  ANESTHESIA:   regional and general  EBL:  Minimal ML  SPECIMEN:  No Specimen  TOURNIQUET:   Total Tourniquet Time Documented: Thigh (Left) - 50 minutes Total: Thigh (Left) - 50 minutes   PROCEDURE DETAILS: Patient is a 79 year old gentleman with tricompartmental osteoarthritis of his left knee. Patient has failed conservative treatment has pain with activities of daily living and presents at this time for total knee arthroplasty. Risks and benefits of surgery were discussed including infection neurovascular injury persistent pain DVT pulmonary embolus need for additional surgery. Patient states he understands and wished to proceed at this time. Description of procedure patient was brought to the operating room and underwent a general anesthetic. After adequate levels of anesthesia were obtained patient's left lower extremity was prepped using DuraPrep draped into a sterile field an [place] was used to cover all exposed skin. A midline incision was made carried down to a medial parapatellar retinacular incision. The IM guide was used to take 10 mm off the distal femur. The external alignment jig was then used to take 10 mm off the proximal tibia with the 7 posterior slope neutral varus and valgus. Attention was focused back on the femur this sized between a size G. and H. and the pins were set at +2 mm for a size G. This did not notch the femur chamfer cuts were made box cuts were made using the jig attention was then focused back on the tibia tibia measured for a size 6 tray. This was sized and keel  punches were made for the size 6 tray. The patella was then resurfaced 10 mm was taken off the patella and holes were made for the 32 mm patella. The knee was irrigated with pulsatile lavage the meniscal tissue was excised the loose bodies were removed. The knee was then implanted with the tibial and femoral components cement was removed pulsatile lavage the polyethylene tray was placed the knee was kept in extension. The patella was clamped and this was left in place until the cement hardened. The clamp was removed. The knee was stable in varus and valgus stress at full extension and flexion. The patella tracked midline. The retinaculum was closed using #1 Vicryl subcutaneous is closed using 0 Vicryl skin was closed using staples. Wound is covered Adaptic orthopedic sponges AB dressing Kerlix and Coban. Patient was extubated taken to the PACU in stable condition.  PLAN OF CARE: Admit to inpatient   PATIENT DISPOSITION:  PACU - hemodynamically stable.   Nadara Mustard, MD 08/19/2012 10:13 AM

## 2012-08-19 NOTE — Progress Notes (Signed)
Orthopedic Tech Progress Note Patient Details:  Angel Norman Angel Norman February 01, 1933 [id number]  Patient ID: Angel Norman, male   DOB: [birth year], 79 y.o.   MRN: [id number] Trapeze bar patient helper  Nikki Dom 08/19/2012, 2:41 PM

## 2012-08-19 NOTE — Progress Notes (Signed)
Orthopedic Tech Progress Note Patient Details:  Melinda Pottinger [facility] 25-Nov-1932 [id number]  Patient ID: Beatriz Chancellor, male   DOB: 1932-12-04, 79 y.o.   MRN: [id number] Viewed order from doctor's order list  Nikki Dom 08/19/2012, 2:41 PM

## 2012-08-20 ENCOUNTER — Encounter (HOSPITAL_COMMUNITY): Payer: Self-pay | Admitting: Orthopedic Surgery

## 2012-08-20 LAB — BASIC METABOLIC PANEL
BUN: 20 mg/dL (ref 6–23)
Calcium: 7.9 mg/dL — ABNORMAL LOW (ref 8.4–10.5)
GFR calc Af Amer: 74 mL/min — ABNORMAL LOW (ref >90)
GFR calc non Af Amer: 63 mL/min — ABNORMAL LOW (ref >90)
Potassium: 4 mEq/L (ref 3.5–5.1)

## 2012-08-20 MED ORDER — OXYCODONE-ACETAMINOPHEN 5-325 MG PO TABS
1.0000 | ORAL_TABLET | ORAL | Status: DC | PRN
  Administered 2012-08-20 – 2012-08-22 (×6): 2 via ORAL
  Filled 2012-08-20 (×6): qty 2

## 2012-08-20 NOTE — Progress Notes (Signed)
Patient ID: Angel Norman, male   DOB: 12-03-1932, 79 y.o.   MRN: [id number] Postoperative day 1 left total knee arthroplasty. There was some drainage through the dressing last night. Patient was given instructions for knee extension exercises. Physical therapy progressive ambulation plan for discharge to home with home health therapy once patient is independent with ambulation.

## 2012-08-20 NOTE — Evaluation (Signed)
Occupational Therapy Evaluation Patient Details Name: Angel Norman MRN: [id number] DOB: 17-Mar-1932 Today's Date: 08/20/2012 Time: 4098-1191 OT Time Calculation (min): 21 min  OT Assessment / Plan / Recommendation Clinical Impression  Pt demos decline in function with ADLs and ADL mobility safety following L TKA. Pt would benefit from acute OT services to address impairments to help restore PLOF to return home safely    OT Assessment  Patient needs continued OT Services    Follow Up Recommendations  Home health OT;Supervision/Assistance - 24 hour    Barriers to Discharge None pt has all necessary DME at home  Equipment Recommendations  None recommended by OT    Recommendations for Other Services    Frequency  Min 2X/week    Precautions / Restrictions Precautions Precautions: Knee Precaution Booklet Issued: Yes (comment) Precaution Comments: TKA exercise sheet given and pt. was educated on APs and quad sets.  He was also asked to do only the exercises checked for him and not to work ahead Restrictions Weight Bearing Restrictions: Yes LLE Weight Bearing: Weight bearing as tolerated   Pertinent Vitals/Pain 4/10 L LE    ADL  Grooming: Performed;Set up Where Assessed - Grooming: Supported sitting Upper Body Bathing: Simulated;Set up;Supervision/safety Lower Body Bathing: +1 Total assistance Upper Body Dressing: Performed;Set up;Supervision/safety Lower Body Dressing: Performed;+1 Total assistance Toilet Transfer: +2 Total assistance;Simulated Toilet Transfer Method: Sit to stand;Stand pivot Toileting - Clothing Manipulation and Hygiene: +1 Total assistance Where Assessed - Toileting Clothing Manipulation and Hygiene: Standing Tub/Shower Transfer Method: Not assessed Transfers/Ambulation Related to ADLs: verbal cues for correct hand placement    OT Diagnosis: Generalized weakness;Acute pain  OT Problem List: Pain;Decreased activity tolerance;Decreased knowledge of use of DME  or AE;Impaired balance (sitting and/or standing) OT Treatment Interventions: Self-care/ADL training;Therapeutic activities;Therapeutic exercise;Patient/family education;DME and/or AE instruction;Balance training   OT Goals Acute Rehab OT Goals OT Goal Formulation: With patient/family Time For Goal Achievement: 08/27/12 Potential to Achieve Goals: Good ADL Goals Pt Will Perform Grooming: with min assist;Standing at sink;Supported ADL Goal: Grooming - Progress: Goal set today Pt Will Perform Lower Body Bathing: with mod assist;with max assist ADL Goal: Lower Body Bathing - Progress: Goal set today Pt Will Perform Lower Body Dressing: with mod assist;with max assist ADL Goal: Lower Body Dressing - Progress: Goal set today Pt Will Transfer to Toilet: with 1+ total assist;with max assist ADL Goal: Toilet Transfer - Progress: Goal set today Pt Will Perform Toileting - Clothing Manipulation: with max assist;with mod assist ADL Goal: Toileting - Clothing Manipulation - Progress: Goal set today Pt Will Perform Toileting - Hygiene: with mod assist;with min assist ADL Goal: Toileting - Hygiene - Progress: Goal set today Pt Will Perform Tub/Shower Transfer: with DME;with +1 total assist;with max assist ADL Goal: Tub/Shower Transfer - Progress: Goal set today  Visit Information  Last OT Received On: 08/20/12 Assistance Needed: +2    Subjective Data  Subjective: " If my leg would wake up, I could do better " Patient Stated Goal: To return home   Prior Functioning     Home Living Lives With: Spouse Available Help at Discharge: Family;Available 24 hours/day Type of Home: House Home Access: Ramped entrance Entrance Stairs-Rails: Right;Left Home Layout: One level Bathroom Shower/Tub: Engineer, manufacturing systems: Handicapped height Bathroom Accessibility: Yes Home Adaptive Equipment: Bedside commode/3-in-1;Tub transfer bench;Walker - rolling;Wheelchair - manual;Straight cane;Shower  chair with back Prior Function Level of Independence: Independent with assistive device(s) Able to Take Stairs?: No Driving: Yes 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: No difficulties Dominant Hand: Left         Vision/Perception Vision - History Baseline Vision: No visual deficits Patient Visual Report: No change from baseline Perception Perception: Within Functional Limits   Cognition  Cognition Arousal/Alertness: Awake/alert Behavior During Therapy: WFL for tasks assessed/performed Overall Cognitive Status: Within Functional Limits for tasks assessed    Extremity/Trunk Assessment Right Upper Extremity Assessment RUE ROM/Strength/Tone: WFL for tasks assessed RUE Sensation: WFL - Light Touch RUE Coordination: WFL - gross/fine motor Left Upper Extremity Assessment LUE ROM/Strength/Tone: WFL for tasks assessed LUE Sensation: WFL - Light Touch LUE Coordination: WFL - gross/fine motor Trunk Assessment Trunk Assessment: Normal     Mobility Bed Mobility Bed Mobility: Supine to Sit;Sitting - Scoot to [place] of Bed;Sit to Supine Supine to Sit: 4: Min assist;With rails;HOB flat Sitting - Scoot to [place] of Bed: 5: Supervision Sit to Supine: Not Tested (comment) Details for Bed Mobility Assistance: cues for safe technique, heavy dependence on right rail Transfers Sit to Stand: 1: +2 Total assist;With upper extremity assist;From bed;From elevated surface Sit to Stand: Patient Percentage: 70% Stand to Sit: 1: +2 Total assist;With upper extremity assist;With armrests;To chair/3-in-1 Stand to Sit: Patient Percentage: 70% Details for Transfer Assistance: cues for correct hand placement and bed height raised        Balance Balance Balance Assessed: No   End of Session OT - End of Session Equipment Utilized During Treatment: Gait belt;Other (comment) (RW) Activity Tolerance: Patient tolerated treatment well Patient left: in  chair;with call bell/phone within reach;with family/visitor present  GO     Galen Manila 08/20/2012, 1:48 PM

## 2012-08-20 NOTE — Care Management Note (Signed)
CARE MANAGEMENT NOTE 08/20/2012  Patient:  Angel Norman, Angel Norman   Account Number:  [id number]  Date Initiated:  08/19/2012  Documentation initiated by:  [facility]  Subjective/Objective Assessment:   admitted postop left total knee arthroplasty     Action/Plan:   PT/OT evals-  6/19 CM spoke with patient concerning Home Health and DME needs at discharge. Has family support at home. Choice offered.   Anticipated DC Date:  08/22/2012   Anticipated DC Plan:  HOME W HOME HEALTH SERVICES      DC Planning Services  CM consult      PAC Choice  DURABLE MEDICAL EQUIPMENT  HOME HEALTH   Choice offered to / List presented to:  C-1 Patient   DME arranged  [facility]      DME agency  [facility]     HH arranged  HH-2 PT    HH-OT  [facility] agency  [facility]   Status of service:  Completed, signed off Medicare Important Message given?   (If response is "NO", the following Medicare IM given date fields will be blank) Date Medicare IM given:   Date Additional Medicare IM given:    Discharge Disposition:  HOME W HOME HEALTH SERVICES  Per UR Regulation:  Reviewed for med. necessity/level of care/duration of stay  If discussed at Long Length of Stay Meetings, dates discussed:    Comments:

## 2012-08-20 NOTE — Evaluation (Signed)
Physical Therapy Evaluation Patient Details Name: Angel Norman MRN: [id number] DOB: [birth year] Today's Date: 08/20/2012 Time: 7829-5621 PT Time Calculation (min): 14 min  PT Assessment / Plan / Recommendation   This patient underwent a left TKA and presents to PT with anticipated post-op decrease in strength and ROM, decreased functional mobility and gait.  Pt. Will benefit from acute PT to address these and below issues. He was limited today by dizziness and was able to transfer to recliner only.  Wife present and is supportive. She is a retired [employer] fundraiser .    Clinical Impression       PT Assessment  Patient needs continued PT services    Follow Up Recommendations  Home health PT;Supervision/Assistance - 24 hour;Supervision for mobility/OOB    Does the patient have the potential to tolerate intense rehabilitation      Barriers to Discharge None      Equipment Recommendations  None recommended by PT    Recommendations for Other Services     Frequency 7X/week    Precautions / Restrictions Precautions Precautions: Knee Precaution Booklet Issued: Yes (comment) Precaution Comments: TKA exercise sheet given and pt. was educated on APs and quad sets.  He was also asked to do only the exercises checked for him and not to work ahead Restrictions Weight Bearing Restrictions: Yes LLE Weight Bearing: Weight bearing as tolerated   Pertinent Vitals/Pain See vitals tab       Mobility  Bed Mobility Bed Mobility: Supine to Sit;Sitting - Scoot to [place] of Bed;Sit to Supine Supine to Sit: 4: Min assist;With rails;HOB flat Sitting - Scoot to [place] of Bed: 5: Supervision Sit to Supine: Not Tested (comment) Details for Bed Mobility Assistance: cues for safe technique, heavy dependence on right rail Transfers Transfers: Sit to Stand;Stand to Sit Sit to Stand: 1: +2 Total assist;With upper extremity assist;From bed;From elevated surface Sit to Stand: Patient Percentage: 70% Stand to Sit: 1: +2  Total assist;With upper extremity assist;With armrests;To chair/3-in-1 Stand to Sit: Patient Percentage: 70% Details for Transfer Assistance: cues for correct hand placement and bed height raised Ambulation/Gait Ambulation/Gait Assistance: 1: +2 Total assist Ambulation/Gait: Patient Percentage: 70% Ambulation Distance (Feet): 2 Feet Assistive device: Rolling walker Ambulation/Gait Assistance Details: cues for technique and for RW management.  He also needed min assist to place left LE in correct positon before sitting down. Gait Pattern: Step-to pattern;Decreased step length - left;Decreased stance time - left;Decreased hip/knee flexion - left    Exercises Total Joint Exercises Ankle Circles/Pumps: AROM;Both;15 reps;Supine Quad Sets: AROM;Left;10 reps;Supine   PT Diagnosis: Difficulty walking;Abnormality of gait;Acute pain  PT Problem List: Decreased strength;Decreased activity tolerance;Decreased mobility;Decreased knowledge of use of DME;Decreased knowledge of precautions;Obesity;Pain PT Treatment Interventions: DME instruction;Gait training;Functional mobility training;Therapeutic activities;Therapeutic exercise;Patient/family education   PT Goals Acute Rehab PT Goals PT Goal Formulation: With patient Time For Goal Achievement: 08/27/12 Potential to Achieve Goals: Good Pt will go Supine/Side to Sit: with modified independence;with HOB 0 degrees PT Goal: Supine/Side to Sit - Progress: Goal set today Pt will go Sit to Supine/Side: with modified independence;with HOB 0 degrees PT Goal: Sit to Supine/Side - Progress: Goal set today Pt will go Sit to Stand: with modified independence PT Goal: Sit to Stand - Progress: Goal set today Pt will go Stand to Sit: with modified independence PT Goal: Stand to Sit - Progress: Progressing toward goal Pt will Ambulate: 51 - 150 feet;with modified independence;with rolling walker PT Goal: Ambulate - Progress: Goal set today Pt will  Perform Home  Exercise Program: Independently PT Goal: Perform Home Exercise Program - Progress: Goal set today  Visit Information  Last PT Received On: 08/20/12 Assistance Needed: +2 PT/OT Co-Evaluation/Treatment: Yes    Subjective Data  Subjective: Pt. 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   Prior Functioning  Home Living Lives With: Spouse Available Help at Discharge: Family;Available 24 hours/day Type of Home: House Home Access: Ramped entrance Entrance Stairs-Rails: Right;Left Home Layout: One level Bathroom Shower/Tub: Engineer, manufacturing systems: Handicapped height Home Adaptive Equipment: Bedside commode/3-in-1;Tub transfer bench;Walker - rolling;Wheelchair - manual;Straight cane;Shower chair with back Prior Function Level of Independence: Independent with assistive device(s) Able to Take Stairs?: No Driving: Yes 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: No difficulties Dominant Hand: Left    Cognition  Cognition Arousal/Alertness: Awake/alert Behavior During Therapy: WFL for tasks assessed/performed Overall Cognitive Status: Within Functional Limits for tasks assessed    Extremity/Trunk Assessment Right Upper Extremity Assessment RUE ROM/Strength/Tone: WFL for tasks assessed RUE Sensation: WFL - Light Touch RUE Coordination: WFL - gross/fine motor Left Upper Extremity Assessment LUE ROM/Strength/Tone: WFL for tasks assessed LUE Sensation: WFL - Light Touch LUE Coordination: WFL - gross/fine motor Right Lower Extremity Assessment RLE ROM/Strength/Tone: WFL for tasks assessed RLE Sensation: WFL - Light Touch RLE Coordination: WFL - gross/fine motor Left Lower Extremity Assessment LLE ROM/Strength/Tone: Deficits;Unable to fully assess;Due to pain LLE ROM/Strength/Tone Deficits: good ankle pump, fair quad set,  LLE Sensation: WFL - Light Touch (detects light  touch but says it feels numb) Trunk Assessment Trunk Assessment: Normal   Balance    End of Session PT - End of Session Equipment Utilized During Treatment: Gait belt Activity Tolerance: Patient tolerated treatment well;Treatment limited secondary to medical complications (Comment) (limited by dizziness) Patient left: in chair;with call bell/phone within reach;with family/visitor present Nurse Communication: Mobility status;Weight bearing status  GP     Ferman Hamming 08/20/2012, 12:29 PM Weldon Picking PT Acute Rehab Services [phone] [place] [phone]

## 2012-08-20 NOTE — Anesthesia Postprocedure Evaluation (Signed)
Anesthesia Post Note  Patient: Angel Norman [facility]  Procedure(s) Performed: Procedure(s) (LRB): TOTAL KNEE ARTHROPLASTY (Left)  Anesthesia type: general  Patient location: PACU  Post pain: Pain level controlled  Post assessment: Patient's Cardiovascular Status Stable  Last Vitals:  Filed Vitals:   08/19/12 2119  BP: 122/76  Pulse: 81  Temp: 36.9 C  Resp: 18    Post vital signs: Reviewed and stable  Level of consciousness: sedated  Complications: No apparent anesthesia complications

## 2012-08-20 NOTE — Plan of Care (Signed)
Problem: Phase II Progression Outcomes Goal: Discharge plan established Recommend HH OT for ADL trg and ADL mobility safety tr after acute care d/c

## 2012-08-21 LAB — CBC
Hemoglobin: 9.3 g/dL — ABNORMAL LOW (ref 13.0–17.0)
Hemoglobin: 8.3 g/dL — ABNORMAL LOW (ref 13.0–17.0)
MCH: 26.2 pg (ref 26.0–34.0)
MCHC: 31.1 g/dL (ref 30.0–36.0)
MCHC: 31.2 g/dL (ref 30.0–36.0)
MCV: 84.2 fL (ref 78.0–100.0)
Platelets: 139 10*3/uL — ABNORMAL LOW (ref 150–400)
RBC: 3.55 MIL/uL — ABNORMAL LOW (ref 4.22–5.81)
RDW: 17.6 % — ABNORMAL HIGH (ref 11.5–15.5)

## 2012-08-21 NOTE — Progress Notes (Signed)
Patient ID: Angel Norman, male   DOB: [birth year], 79 y.o.   MRN: [id number] Patient states that no one would work with him yesterday for gait training. Postoperative day 2 total knee arthroplasty. Hemoglobin decreased electrolytes stable.

## 2012-08-21 NOTE — Progress Notes (Signed)
Physical Therapy Treatment Patient Details Name: Angel Norman MRN: [id number] DOB: 04/22/1932 Today's Date: 08/21/2012 Time: 3086-5784 PT Time Calculation (min): 24 min  PT Assessment / Plan / Recommendation Comments on Treatment Session  Patient s/p L TKA. Patient progressnig well but is impulsive and has difficult listening and following cues for sfaety. Patient planning to DC in AM. Patient has ramp to enter house    Follow Up Recommendations  Home health PT;Supervision/Assistance - 24 hour;Supervision for mobility/OOB     Does the patient have the potential to tolerate intense rehabilitation     Barriers to Discharge        Equipment Recommendations  None recommended by PT    Recommendations for Other Services    Frequency 7X/week   Plan Discharge plan remains appropriate;Frequency remains appropriate    Precautions / Restrictions Precautions Precautions: Knee Restrictions LLE Weight Bearing: Weight bearing as tolerated   Pertinent Vitals/Pain     Mobility  Bed Mobility Supine to Sit: 5: Supervision Sitting - Scoot to Edge of Bed: 4: Min guard Sit to Supine: 5: Supervision Details for Bed Mobility Assistance: cues for safe technique,  Transfers Sit to Stand: 4: Min guard;With upper extremity assist;From bed Stand to Sit: 4: Min guard;With upper extremity assist;To bed Details for Transfer Assistance: cues for correct hand placement and Minguard for safety Ambulation/Gait Ambulation/Gait Assistance: 4: Min guard Ambulation Distance (Feet): 150 Feet Assistive device: Rolling walker Ambulation/Gait Assistance Details: Cues for safety with RW and body positioning Gait Pattern: Step-to pattern;Decreased step length - left;Decreased stance time - left    Exercises Total Joint Exercises Quad Sets: AROM;Left;10 reps;Supine Heel Slides: AAROM;Left;10 reps Hip ABduction/ADduction: AAROM;Left;10 reps Straight Leg Raises: AAROM;Left;10 reps Long Arc Quad: AAROM;Left;10  reps Goniometric ROM: 55 degree PROM knee flexion   PT Diagnosis:    PT Problem List:   PT Treatment Interventions:     PT Goals Acute Rehab PT Goals PT Goal: Supine/Side to Sit - Progress: Progressing toward goal PT Goal: Sit to Supine/Side - Progress: Progressing toward goal PT Goal: Sit to Stand - Progress: Progressing toward goal PT Goal: Stand to Sit - Progress: Progressing toward goal PT Goal: Ambulate - Progress: Progressing toward goal PT Goal: Perform Home Exercise Program - Progress: Progressing toward goal  Visit Information  Last PT Received On: 08/21/12 Assistance Needed: +1    Subjective Data      Cognition  Cognition Arousal/Alertness: Awake/alert Behavior During Therapy: WFL for tasks assessed/performed Overall Cognitive Status: Within Functional Limits for tasks assessed    Balance     End of Session PT - End of Session Equipment Utilized During Treatment: Gait belt Activity Tolerance: Patient tolerated treatment well Patient left: in bed;with call bell/phone within reach Nurse Communication: Mobility status   GP     Fredrich Birks 08/21/2012, 2:51 PM  08/21/2012 Fredrich Birks PTA [phone] pager [phone] office

## 2012-08-21 NOTE — Progress Notes (Signed)
Physical Therapy Treatment Patient Details Name: Angel Norman MRN: [id number] DOB: May 27, 1932 Today's Date: 08/21/2012 Time: 9528-4132 PT Time Calculation (min): 27 min  PT Assessment / Plan / Recommendation Comments on Treatment Session  Patient s/p L TKA. Patient progressnig well but is impulsive and has difficult listening and following cues for sfaety. Patient had gotten up in his room by himself. Educated on risk of falls. Anticipate home with wife tomorrow    Follow Up Recommendations  Home health PT;Supervision/Assistance - 24 hour;Supervision for mobility/OOB     Does the patient have the potential to tolerate intense rehabilitation     Barriers to Discharge        Equipment Recommendations  None recommended by PT    Recommendations for Other Services    Frequency 7X/week   Plan Discharge plan remains appropriate;Frequency remains appropriate    Precautions / Restrictions Precautions Precautions: Knee Restrictions Weight Bearing Restrictions: Yes LLE Weight Bearing: Weight bearing as tolerated   Pertinent Vitals/Pain 7/10 L knee pain    Mobility  Bed Mobility Supine to Sit: 4: Min guard Sitting - Scoot to Edge of Bed: 4: Min guard Details for Bed Mobility Assistance: cues for safe technique,  Transfers Sit to Stand: 4: Min assist;From bed;With upper extremity assist Stand to Sit: 4: Min assist;With upper extremity assist;With armrests;To chair/3-in-1 Details for Transfer Assistance: cues for correct hand placement and Min A for safety and control Ambulation/Gait Ambulation/Gait Assistance: 4: Min guard Ambulation Distance (Feet): 150 Feet Assistive device: Rolling walker Ambulation/Gait Assistance Details: Cues for safety with Rw and patient tends to keep it too far out in front of him. Patient impulsive with ambulation.  Gait Pattern: Step-to pattern;Decreased step length - left;Decreased stance time - left    Exercises Total Joint Exercises Quad Sets:  AROM;Left;10 reps;Supine Heel Slides: AAROM;Left;10 reps Hip ABduction/ADduction: AAROM;Left;10 reps Straight Leg Raises: AAROM;Left;10 reps Long Arc Quad: AAROM;Left;10 reps Goniometric ROM: 55 degree PROM knee flexion   PT Diagnosis:    PT Problem List:   PT Treatment Interventions:     PT Goals Acute Rehab PT Goals PT Goal: Supine/Side to Sit - Progress: Progressing toward goal PT Goal: Sit to Stand - Progress: Progressing toward goal PT Goal: Stand to Sit - Progress: Progressing toward goal PT Goal: Ambulate - Progress: Progressing toward goal PT Goal: Perform Home Exercise Program - Progress: Progressing toward goal  Visit Information  Last PT Received On: 08/21/12 Assistance Needed: +1    Subjective Data      Cognition  Cognition Arousal/Alertness: Awake/alert Behavior During Therapy: WFL for tasks assessed/performed Overall Cognitive Status: Within Functional Limits for tasks assessed    Balance     End of Session PT - End of Session Equipment Utilized During Treatment: Gait belt Activity Tolerance: Patient tolerated treatment well Patient left: in chair;with call bell/phone within reach;with family/visitor present Nurse Communication: Mobility status   GP     Fredrich Birks 08/21/2012, 11:59 AM 08/21/2012 Fredrich Birks PTA [phone] pager [phone] office

## 2012-08-22 LAB — CBC
MCV: 84.2 fL (ref 78.0–100.0)
RBC: 3.11 MIL/uL — ABNORMAL LOW (ref 4.22–5.81)
RDW: 17.6 % — ABNORMAL HIGH (ref 11.5–15.5)
WBC: 6.6 10*3/uL (ref 4.0–10.5)

## 2012-08-22 MED ORDER — OXYCODONE-ACETAMINOPHEN 5-325 MG PO TABS: 1.0000 | ORAL_TABLET | ORAL | Status: DC | PRN

## 2012-08-22 MED ORDER — METHOCARBAMOL 500 MG PO TABS: 500.0000 mg | ORAL_TABLET | Freq: Two times a day (BID) | ORAL | Status: DC | PRN

## 2012-08-22 NOTE — Progress Notes (Signed)
Physical Therapy Treatment Patient Details Name: Angel Norman MRN: [id number] DOB: 09-24-1932 Today's Date: 08/22/2012 Time: 4010-2725 PT Time Calculation (min): 29 min  PT Assessment / Plan / Recommendation Comments on Treatment Session  POD # 3 L TKR planning to D/C to home today.  Assisted pt OOB to amb in hallway then back to bed to perform TKR TE's.  Pt given HEP handout and performed all supine TE's.  Instructed on HEP freq and use of ICE to L knee after.  Spouse present during session and observed.     Follow Up Recommendations  Home health PT;Supervision/Assistance - 24 hour;Supervision for mobility/OOB     Does the patient have the potential to tolerate intense rehabilitation     Barriers to Discharge        Equipment Recommendations  None recommended by PT    Recommendations for Other Services    Frequency     Plan Discharge plan remains appropriate;Frequency remains appropriate    Precautions / Restrictions Precautions Precautions: Knee Precaution Booklet Issued: Yes (comment) Precaution Comments: Pt given TKR HEP and instructed to perform all 2 times a day starting with 10 reps of each.  Restrictions Weight Bearing Restrictions: No LLE Weight Bearing: Weight bearing as tolerated   Pertinent Vitals/Pain C/o 5/10 during TE's ICE applied    Mobility  Bed Mobility Bed Mobility: Supine to Sit;Sitting - Scoot to [place] of Bed;Sit to Supine Supine to Sit: 4: Min guard Sitting - Scoot to [place] of Bed: 4: Min guard Sit to Supine: 4: Min guard Details for Bed Mobility Assistance: increased time and min guard assist for L LE Transfers Transfers: Sit to Stand;Stand to Sit Sit to Stand: 5: Supervision;4: Min guard;From bed Stand to Sit: 5: Supervision;4: Min guard;To bed Details for Transfer Assistance: 25% Vc's on proper tech and safety as pt is impulsive. Instructed on turn completion prior to sit. Ambulation/Gait Ambulation/Gait Assistance: 4: Min guard Ambulation  Distance (Feet): 130 Feet Assistive device: Rolling walker Ambulation/Gait Assistance Details: 25% VC's on proper walker to self distance and upright posture plus VC's for safety with turns and backward gait completion prior to sit. Gait Pattern: Step-to pattern;Decreased step length - left;Decreased stance time - left Stairs: No (Pt has a ramp due to a disable family member)    Exercises   Total Knee Replacement TE's 10 reps B LE ankle pumps 10 reps knee presses 10 reps heel slides  10 reps SAQ's 10 reps SLR's 10 reps ABD Followed by ICE    PT Goals                                                          progressing    Visit Information  Last PT Received On: 08/22/12 Assistance Needed: +1    Subjective Data      Cognition    good impulsive   Balance   fair+  End of Session PT - End of Session Equipment Utilized During Treatment: Gait belt Activity Tolerance: Patient tolerated treatment well Patient left: in bed;with call bell/phone within reach;with family/visitor present   Felecia Shelling  PTA [facility]  Acute  Rehab Pager      [phone]

## 2012-08-22 NOTE — Progress Notes (Signed)
Pt. Discharged to home with wife at 57.  Escorted to vehicle via wheelchair by NT.  VSS; All discharge instructions provided; prescriptions in hand. No further questions asked.  All personal belongings in tow.
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
## 2012-08-24 DIAGNOSIS — M6281 Muscle weakness (generalized): Secondary | ICD-10-CM | POA: Diagnosis not present

## 2012-08-24 DIAGNOSIS — Z5189 Encounter for other specified aftercare: Secondary | ICD-10-CM | POA: Diagnosis not present

## 2012-08-24 DIAGNOSIS — Z96659 Presence of unspecified artificial knee joint: Secondary | ICD-10-CM | POA: Diagnosis not present

## 2012-08-24 DIAGNOSIS — R269 Unspecified abnormalities of gait and mobility: Secondary | ICD-10-CM | POA: Diagnosis not present

## 2012-08-24 DIAGNOSIS — Z471 Aftercare following joint replacement surgery: Secondary | ICD-10-CM | POA: Diagnosis not present

## 2012-08-24 NOTE — Discharge Summary (Signed)
Physician Discharge Summary  Patient ID: Angel Norman MRN: [id number] DOB/AGE: 1932/05/03 79 y.o.  Admit date: 08/19/2012 Discharge date: 08/24/2012  Admission Diagnoses: Osteoarthritis left knee  Discharge Diagnoses: Osteoarthritis left knee Active Problems:   * No active hospital problems. *   Discharged Condition: stable  Hospital Course: Patient's hospital course is essentially unremarkable. He underwent total knee arthroplasty and was discharged to home in stable condition.  Consults: None  Significant Diagnostic Studies: labs: Routine labs  Treatments: surgery: See operative note  Discharge Exam: Blood pressure 112/51, pulse 84, temperature 98.4 F (36.9 C), temperature source Oral, resp. rate 16, SpO2 93.00%. Incision/Wound: incision clean and dry at time of discharge  Disposition: 06-Home-Health Care Svc  Discharge Orders   Future Orders Complete By Expires     Call MD / Call 911  As directed     Comments:      If you experience chest pain or shortness of breath, CALL 911 and be transported to the hospital emergency room.  If you develope a fever above 101 F, pus (white drainage) or increased drainage or redness at the wound, or calf pain, call your surgeon's office.    Constipation Prevention  As directed     Comments:      Drink plenty of fluids.  Prune juice may be helpful.  You may use a stool softener, such as Colace (over the counter) 100 mg twice a day.  Use MiraLax (over the counter) for constipation as needed.    Diet - low sodium heart healthy  As directed     Increase activity slowly as tolerated  As directed         Medication List    TAKE these medications       aspirin EC 81 MG tablet  Take 81 mg by mouth daily.     famotidine 20 MG tablet  Commonly known as:  PEPCID  Take 20 mg by mouth 2 (two) times daily.     methocarbamol 500 MG tablet  Commonly known as:  ROBAXIN  Take 500 mg by mouth 2 (two) times daily as needed. Muscle spasm.     methocarbamol 500 MG tablet  Commonly known as:  ROBAXIN  Take 1 tablet (500 mg total) by mouth 2 (two) times daily as needed.     naproxen 500 MG EC tablet  Commonly known as:  EC NAPROSYN  Take 500 mg by mouth 2 (two) times daily as needed. Pain     oxyCODONE-acetaminophen 5-325 MG per tablet  Commonly known as:  PERCOCET  Take 2 tablets by mouth every 4 (four) hours as needed for pain.     oxyCODONE-acetaminophen 5-325 MG per tablet  Commonly known as:  ROXICET  Take 1 tablet by mouth every 4 (four) hours as needed for pain.     pravastatin 40 MG tablet  Commonly known as:  PRAVACHOL  Take 40 mg by mouth daily.     tamsulosin 0.4 MG Caps  Commonly known as:  FLOMAX  Take 0.4 mg by mouth daily.         SignedNadara Mustard 08/24/2012, 7:24 PM

## 2012-08-26 DIAGNOSIS — Z5189 Encounter for other specified aftercare: Secondary | ICD-10-CM | POA: Diagnosis not present

## 2012-08-26 DIAGNOSIS — M6281 Muscle weakness (generalized): Secondary | ICD-10-CM | POA: Diagnosis not present

## 2012-08-26 DIAGNOSIS — Z471 Aftercare following joint replacement surgery: Secondary | ICD-10-CM | POA: Diagnosis not present

## 2012-08-26 DIAGNOSIS — Z96659 Presence of unspecified artificial knee joint: Secondary | ICD-10-CM | POA: Diagnosis not present

## 2012-08-26 DIAGNOSIS — R269 Unspecified abnormalities of gait and mobility: Secondary | ICD-10-CM | POA: Diagnosis not present

## 2012-08-28 DIAGNOSIS — R269 Unspecified abnormalities of gait and mobility: Secondary | ICD-10-CM | POA: Diagnosis not present

## 2012-08-28 DIAGNOSIS — Z5189 Encounter for other specified aftercare: Secondary | ICD-10-CM | POA: Diagnosis not present

## 2012-08-28 DIAGNOSIS — Z96659 Presence of unspecified artificial knee joint: Secondary | ICD-10-CM | POA: Diagnosis not present

## 2012-08-28 DIAGNOSIS — Z471 Aftercare following joint replacement surgery: Secondary | ICD-10-CM | POA: Diagnosis not present

## 2012-08-28 DIAGNOSIS — M6281 Muscle weakness (generalized): Secondary | ICD-10-CM | POA: Diagnosis not present

## 2012-08-31 DIAGNOSIS — R269 Unspecified abnormalities of gait and mobility: Secondary | ICD-10-CM | POA: Diagnosis not present

## 2012-08-31 DIAGNOSIS — Z5189 Encounter for other specified aftercare: Secondary | ICD-10-CM | POA: Diagnosis not present

## 2012-08-31 DIAGNOSIS — Z471 Aftercare following joint replacement surgery: Secondary | ICD-10-CM | POA: Diagnosis not present

## 2012-08-31 DIAGNOSIS — Z96659 Presence of unspecified artificial knee joint: Secondary | ICD-10-CM | POA: Diagnosis not present

## 2012-08-31 DIAGNOSIS — M6281 Muscle weakness (generalized): Secondary | ICD-10-CM | POA: Diagnosis not present

## 2012-09-02 DIAGNOSIS — R269 Unspecified abnormalities of gait and mobility: Secondary | ICD-10-CM | POA: Diagnosis not present

## 2012-09-02 DIAGNOSIS — Z5189 Encounter for other specified aftercare: Secondary | ICD-10-CM | POA: Diagnosis not present

## 2012-09-02 DIAGNOSIS — M6281 Muscle weakness (generalized): Secondary | ICD-10-CM | POA: Diagnosis not present

## 2012-09-02 DIAGNOSIS — Z96659 Presence of unspecified artificial knee joint: Secondary | ICD-10-CM | POA: Diagnosis not present

## 2012-09-02 DIAGNOSIS — M1710 Unilateral primary osteoarthritis, unspecified knee: Secondary | ICD-10-CM | POA: Diagnosis not present

## 2012-09-02 DIAGNOSIS — Z471 Aftercare following joint replacement surgery: Secondary | ICD-10-CM | POA: Diagnosis not present

## 2012-09-03 DIAGNOSIS — Z96659 Presence of unspecified artificial knee joint: Secondary | ICD-10-CM | POA: Diagnosis not present

## 2012-09-03 DIAGNOSIS — R269 Unspecified abnormalities of gait and mobility: Secondary | ICD-10-CM | POA: Diagnosis not present

## 2012-09-03 DIAGNOSIS — Z5189 Encounter for other specified aftercare: Secondary | ICD-10-CM | POA: Diagnosis not present

## 2012-09-03 DIAGNOSIS — M6281 Muscle weakness (generalized): Secondary | ICD-10-CM | POA: Diagnosis not present

## 2012-09-03 DIAGNOSIS — Z471 Aftercare following joint replacement surgery: Secondary | ICD-10-CM | POA: Diagnosis not present

## 2012-09-08 DIAGNOSIS — Z5189 Encounter for other specified aftercare: Secondary | ICD-10-CM | POA: Diagnosis not present

## 2012-09-08 DIAGNOSIS — Z96659 Presence of unspecified artificial knee joint: Secondary | ICD-10-CM | POA: Diagnosis not present

## 2012-09-08 DIAGNOSIS — Z471 Aftercare following joint replacement surgery: Secondary | ICD-10-CM | POA: Diagnosis not present

## 2012-09-08 DIAGNOSIS — R269 Unspecified abnormalities of gait and mobility: Secondary | ICD-10-CM | POA: Diagnosis not present

## 2012-09-08 DIAGNOSIS — M6281 Muscle weakness (generalized): Secondary | ICD-10-CM | POA: Diagnosis not present

## 2012-09-10 DIAGNOSIS — R269 Unspecified abnormalities of gait and mobility: Secondary | ICD-10-CM | POA: Diagnosis not present

## 2012-09-10 DIAGNOSIS — Z471 Aftercare following joint replacement surgery: Secondary | ICD-10-CM | POA: Diagnosis not present

## 2012-09-10 DIAGNOSIS — Z5189 Encounter for other specified aftercare: Secondary | ICD-10-CM | POA: Diagnosis not present

## 2012-09-10 DIAGNOSIS — M6281 Muscle weakness (generalized): Secondary | ICD-10-CM | POA: Diagnosis not present

## 2012-09-10 DIAGNOSIS — Z96659 Presence of unspecified artificial knee joint: Secondary | ICD-10-CM | POA: Diagnosis not present

## 2012-09-11 DIAGNOSIS — Z96659 Presence of unspecified artificial knee joint: Secondary | ICD-10-CM | POA: Diagnosis not present

## 2012-09-11 DIAGNOSIS — Z5189 Encounter for other specified aftercare: Secondary | ICD-10-CM | POA: Diagnosis not present

## 2012-09-11 DIAGNOSIS — R269 Unspecified abnormalities of gait and mobility: Secondary | ICD-10-CM | POA: Diagnosis not present

## 2012-09-11 DIAGNOSIS — Z471 Aftercare following joint replacement surgery: Secondary | ICD-10-CM | POA: Diagnosis not present

## 2012-09-11 DIAGNOSIS — M6281 Muscle weakness (generalized): Secondary | ICD-10-CM | POA: Diagnosis not present

## 2012-09-14 DIAGNOSIS — Z471 Aftercare following joint replacement surgery: Secondary | ICD-10-CM | POA: Diagnosis not present

## 2012-09-14 DIAGNOSIS — Z5189 Encounter for other specified aftercare: Secondary | ICD-10-CM | POA: Diagnosis not present

## 2012-09-14 DIAGNOSIS — Z96659 Presence of unspecified artificial knee joint: Secondary | ICD-10-CM | POA: Diagnosis not present

## 2012-09-14 DIAGNOSIS — M6281 Muscle weakness (generalized): Secondary | ICD-10-CM | POA: Diagnosis not present

## 2012-09-14 DIAGNOSIS — R269 Unspecified abnormalities of gait and mobility: Secondary | ICD-10-CM | POA: Diagnosis not present

## 2012-09-15 DIAGNOSIS — Z96659 Presence of unspecified artificial knee joint: Secondary | ICD-10-CM | POA: Diagnosis not present

## 2012-09-15 DIAGNOSIS — M6281 Muscle weakness (generalized): Secondary | ICD-10-CM | POA: Diagnosis not present

## 2012-09-15 DIAGNOSIS — R269 Unspecified abnormalities of gait and mobility: Secondary | ICD-10-CM | POA: Diagnosis not present

## 2012-09-15 DIAGNOSIS — Z5189 Encounter for other specified aftercare: Secondary | ICD-10-CM | POA: Diagnosis not present

## 2012-09-15 DIAGNOSIS — Z471 Aftercare following joint replacement surgery: Secondary | ICD-10-CM | POA: Diagnosis not present

## 2012-09-17 DIAGNOSIS — Z96659 Presence of unspecified artificial knee joint: Secondary | ICD-10-CM | POA: Diagnosis not present

## 2012-09-17 DIAGNOSIS — M6281 Muscle weakness (generalized): Secondary | ICD-10-CM | POA: Diagnosis not present

## 2012-09-17 DIAGNOSIS — Z471 Aftercare following joint replacement surgery: Secondary | ICD-10-CM | POA: Diagnosis not present

## 2012-09-17 DIAGNOSIS — Z5189 Encounter for other specified aftercare: Secondary | ICD-10-CM | POA: Diagnosis not present

## 2012-09-17 DIAGNOSIS — R269 Unspecified abnormalities of gait and mobility: Secondary | ICD-10-CM | POA: Diagnosis not present

## 2012-09-21 DIAGNOSIS — Z5189 Encounter for other specified aftercare: Secondary | ICD-10-CM | POA: Diagnosis not present

## 2012-09-21 DIAGNOSIS — Z471 Aftercare following joint replacement surgery: Secondary | ICD-10-CM | POA: Diagnosis not present

## 2012-09-21 DIAGNOSIS — M6281 Muscle weakness (generalized): Secondary | ICD-10-CM | POA: Diagnosis not present

## 2012-09-21 DIAGNOSIS — R269 Unspecified abnormalities of gait and mobility: Secondary | ICD-10-CM | POA: Diagnosis not present

## 2012-09-21 DIAGNOSIS — Z96659 Presence of unspecified artificial knee joint: Secondary | ICD-10-CM | POA: Diagnosis not present

## 2012-09-23 DIAGNOSIS — M1710 Unilateral primary osteoarthritis, unspecified knee: Secondary | ICD-10-CM | POA: Diagnosis not present

## 2012-09-25 DIAGNOSIS — Z471 Aftercare following joint replacement surgery: Secondary | ICD-10-CM | POA: Diagnosis not present

## 2012-09-25 DIAGNOSIS — M6281 Muscle weakness (generalized): Secondary | ICD-10-CM | POA: Diagnosis not present

## 2012-09-25 DIAGNOSIS — Z5189 Encounter for other specified aftercare: Secondary | ICD-10-CM | POA: Diagnosis not present

## 2012-09-25 DIAGNOSIS — R269 Unspecified abnormalities of gait and mobility: Secondary | ICD-10-CM | POA: Diagnosis not present

## 2012-09-25 DIAGNOSIS — Z96659 Presence of unspecified artificial knee joint: Secondary | ICD-10-CM | POA: Diagnosis not present

## 2012-10-15 DIAGNOSIS — Z6839 Body mass index (BMI) 39.0-39.9, adult: Secondary | ICD-10-CM | POA: Diagnosis not present

## 2012-10-15 DIAGNOSIS — D649 Anemia, unspecified: Secondary | ICD-10-CM | POA: Diagnosis not present

## 2012-10-15 DIAGNOSIS — E782 Mixed hyperlipidemia: Secondary | ICD-10-CM | POA: Diagnosis not present

## 2012-10-29 DIAGNOSIS — N529 Male erectile dysfunction, unspecified: Secondary | ICD-10-CM | POA: Diagnosis not present

## 2012-10-29 DIAGNOSIS — N411 Chronic prostatitis: Secondary | ICD-10-CM | POA: Diagnosis not present

## 2012-11-11 DIAGNOSIS — Z961 Presence of intraocular lens: Secondary | ICD-10-CM | POA: Diagnosis not present

## 2012-11-11 DIAGNOSIS — H04129 Dry eye syndrome of unspecified lacrimal gland: Secondary | ICD-10-CM | POA: Diagnosis not present

## 2012-11-11 DIAGNOSIS — Z0100 Encounter for examination of eyes and vision without abnormal findings: Secondary | ICD-10-CM | POA: Diagnosis not present

## 2012-11-17 DIAGNOSIS — D649 Anemia, unspecified: Secondary | ICD-10-CM | POA: Diagnosis not present

## 2013-03-09 DIAGNOSIS — M545 Low back pain, unspecified: Secondary | ICD-10-CM | POA: Diagnosis not present

## 2013-03-09 DIAGNOSIS — M5137 Other intervertebral disc degeneration, lumbosacral region: Secondary | ICD-10-CM | POA: Diagnosis not present

## 2013-03-09 DIAGNOSIS — M999 Biomechanical lesion, unspecified: Secondary | ICD-10-CM | POA: Diagnosis not present

## 2013-03-10 DIAGNOSIS — M545 Low back pain, unspecified: Secondary | ICD-10-CM | POA: Diagnosis not present

## 2013-03-10 DIAGNOSIS — M5137 Other intervertebral disc degeneration, lumbosacral region: Secondary | ICD-10-CM | POA: Diagnosis not present

## 2013-03-10 DIAGNOSIS — M999 Biomechanical lesion, unspecified: Secondary | ICD-10-CM | POA: Diagnosis not present

## 2013-03-12 DIAGNOSIS — M5137 Other intervertebral disc degeneration, lumbosacral region: Secondary | ICD-10-CM | POA: Diagnosis not present

## 2013-03-12 DIAGNOSIS — M545 Low back pain, unspecified: Secondary | ICD-10-CM | POA: Diagnosis not present

## 2013-03-12 DIAGNOSIS — M999 Biomechanical lesion, unspecified: Secondary | ICD-10-CM | POA: Diagnosis not present

## 2013-03-15 DIAGNOSIS — M545 Low back pain, unspecified: Secondary | ICD-10-CM | POA: Diagnosis not present

## 2013-03-15 DIAGNOSIS — M999 Biomechanical lesion, unspecified: Secondary | ICD-10-CM | POA: Diagnosis not present

## 2013-03-15 DIAGNOSIS — M5137 Other intervertebral disc degeneration, lumbosacral region: Secondary | ICD-10-CM | POA: Diagnosis not present

## 2013-03-19 DIAGNOSIS — M545 Low back pain, unspecified: Secondary | ICD-10-CM | POA: Diagnosis not present

## 2013-03-19 DIAGNOSIS — M999 Biomechanical lesion, unspecified: Secondary | ICD-10-CM | POA: Diagnosis not present

## 2013-03-19 DIAGNOSIS — M5137 Other intervertebral disc degeneration, lumbosacral region: Secondary | ICD-10-CM | POA: Diagnosis not present

## 2013-03-22 DIAGNOSIS — M5137 Other intervertebral disc degeneration, lumbosacral region: Secondary | ICD-10-CM | POA: Diagnosis not present

## 2013-03-22 DIAGNOSIS — M999 Biomechanical lesion, unspecified: Secondary | ICD-10-CM | POA: Diagnosis not present

## 2013-03-22 DIAGNOSIS — M545 Low back pain, unspecified: Secondary | ICD-10-CM | POA: Diagnosis not present

## 2013-03-26 DIAGNOSIS — M5137 Other intervertebral disc degeneration, lumbosacral region: Secondary | ICD-10-CM | POA: Diagnosis not present

## 2013-03-26 DIAGNOSIS — M545 Low back pain, unspecified: Secondary | ICD-10-CM | POA: Diagnosis not present

## 2013-03-26 DIAGNOSIS — M999 Biomechanical lesion, unspecified: Secondary | ICD-10-CM | POA: Diagnosis not present

## 2013-04-23 DIAGNOSIS — E782 Mixed hyperlipidemia: Secondary | ICD-10-CM | POA: Diagnosis not present

## 2013-04-23 DIAGNOSIS — Z1331 Encounter for screening for depression: Secondary | ICD-10-CM | POA: Diagnosis not present

## 2013-04-23 DIAGNOSIS — R609 Edema, unspecified: Secondary | ICD-10-CM | POA: Diagnosis not present

## 2013-04-23 DIAGNOSIS — Z23 Encounter for immunization: Secondary | ICD-10-CM | POA: Diagnosis not present

## 2013-04-23 DIAGNOSIS — Z6838 Body mass index (BMI) 38.0-38.9, adult: Secondary | ICD-10-CM | POA: Diagnosis not present

## 2013-06-17 DIAGNOSIS — M5137 Other intervertebral disc degeneration, lumbosacral region: Secondary | ICD-10-CM | POA: Diagnosis not present

## 2013-06-17 DIAGNOSIS — M545 Low back pain, unspecified: Secondary | ICD-10-CM | POA: Diagnosis not present

## 2013-06-17 DIAGNOSIS — M999 Biomechanical lesion, unspecified: Secondary | ICD-10-CM | POA: Diagnosis not present

## 2013-06-21 DIAGNOSIS — M999 Biomechanical lesion, unspecified: Secondary | ICD-10-CM | POA: Diagnosis not present

## 2013-06-21 DIAGNOSIS — M545 Low back pain, unspecified: Secondary | ICD-10-CM | POA: Diagnosis not present

## 2013-06-21 DIAGNOSIS — M5137 Other intervertebral disc degeneration, lumbosacral region: Secondary | ICD-10-CM | POA: Diagnosis not present

## 2013-12-03 DIAGNOSIS — N411 Chronic prostatitis: Secondary | ICD-10-CM | POA: Diagnosis not present

## 2013-12-20 ENCOUNTER — Ambulatory Visit
Admission: RE | Admit: 2013-12-20 | Discharge: 2013-12-20 | Disposition: A | Discharge status: Home / Self Care | Payer: Medicare Other | Source: Ambulatory Visit | Attending: Family Medicine | Admitting: Family Medicine

## 2013-12-20 ENCOUNTER — Other Ambulatory Visit: Payer: Self-pay | Admitting: Family Medicine

## 2013-12-20 DIAGNOSIS — R05 Cough: Secondary | ICD-10-CM | POA: Diagnosis not present

## 2013-12-20 DIAGNOSIS — J45909 Unspecified asthma, uncomplicated: Secondary | ICD-10-CM | POA: Diagnosis not present

## 2013-12-20 DIAGNOSIS — R059 Cough, unspecified: Secondary | ICD-10-CM

## 2013-12-20 DIAGNOSIS — L570 Actinic keratosis: Secondary | ICD-10-CM | POA: Diagnosis not present

## 2013-12-20 DIAGNOSIS — R0602 Shortness of breath: Secondary | ICD-10-CM | POA: Diagnosis not present

## 2013-12-20 IMAGING — CR DG CHEST 2V
2 series · 2 of 2 positions shown · non-contrast
Comparison: [DATE]

CLINICAL DATA: Cough and short of breath

EXAM:
CHEST  2 VIEW

[w chest pa]
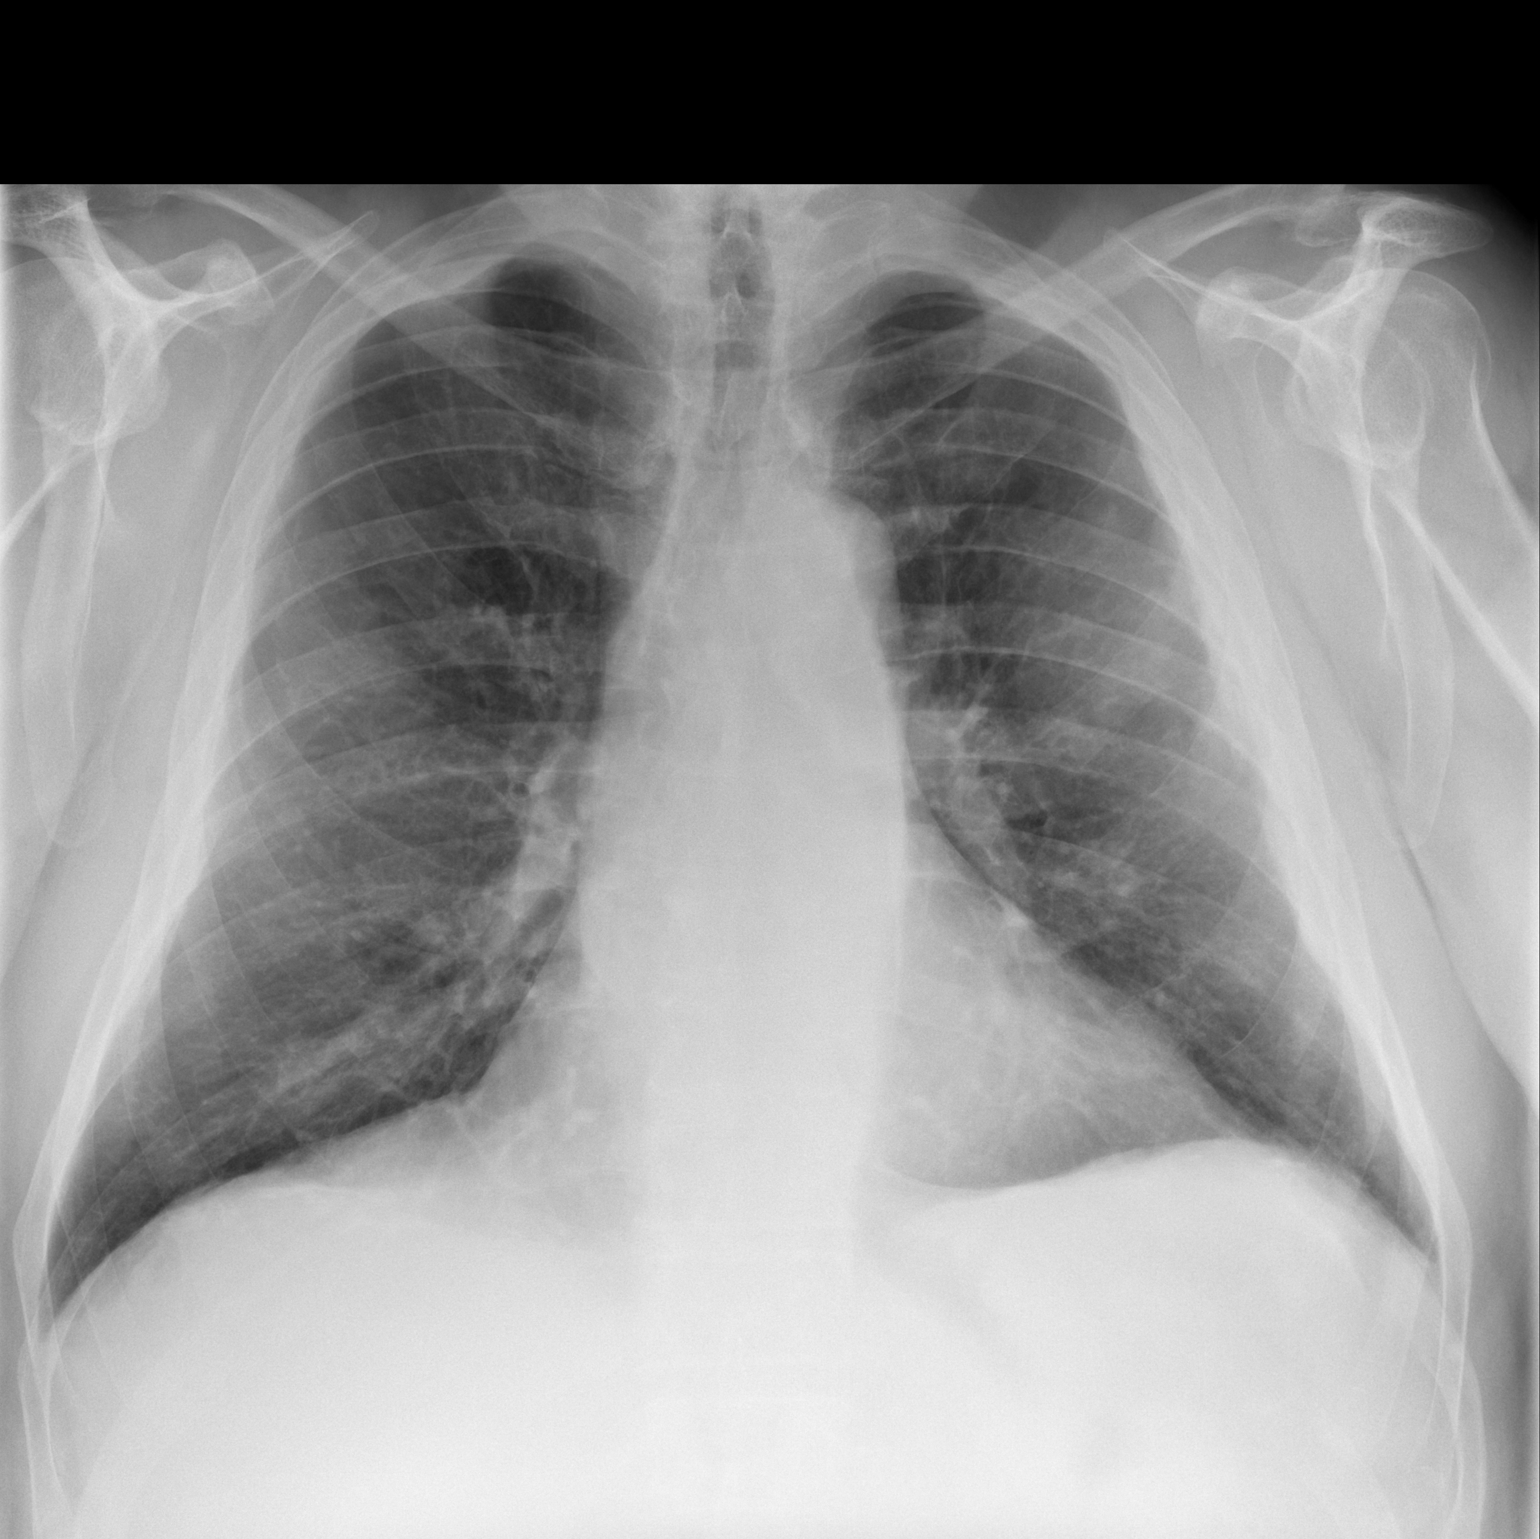

[w chest lat]
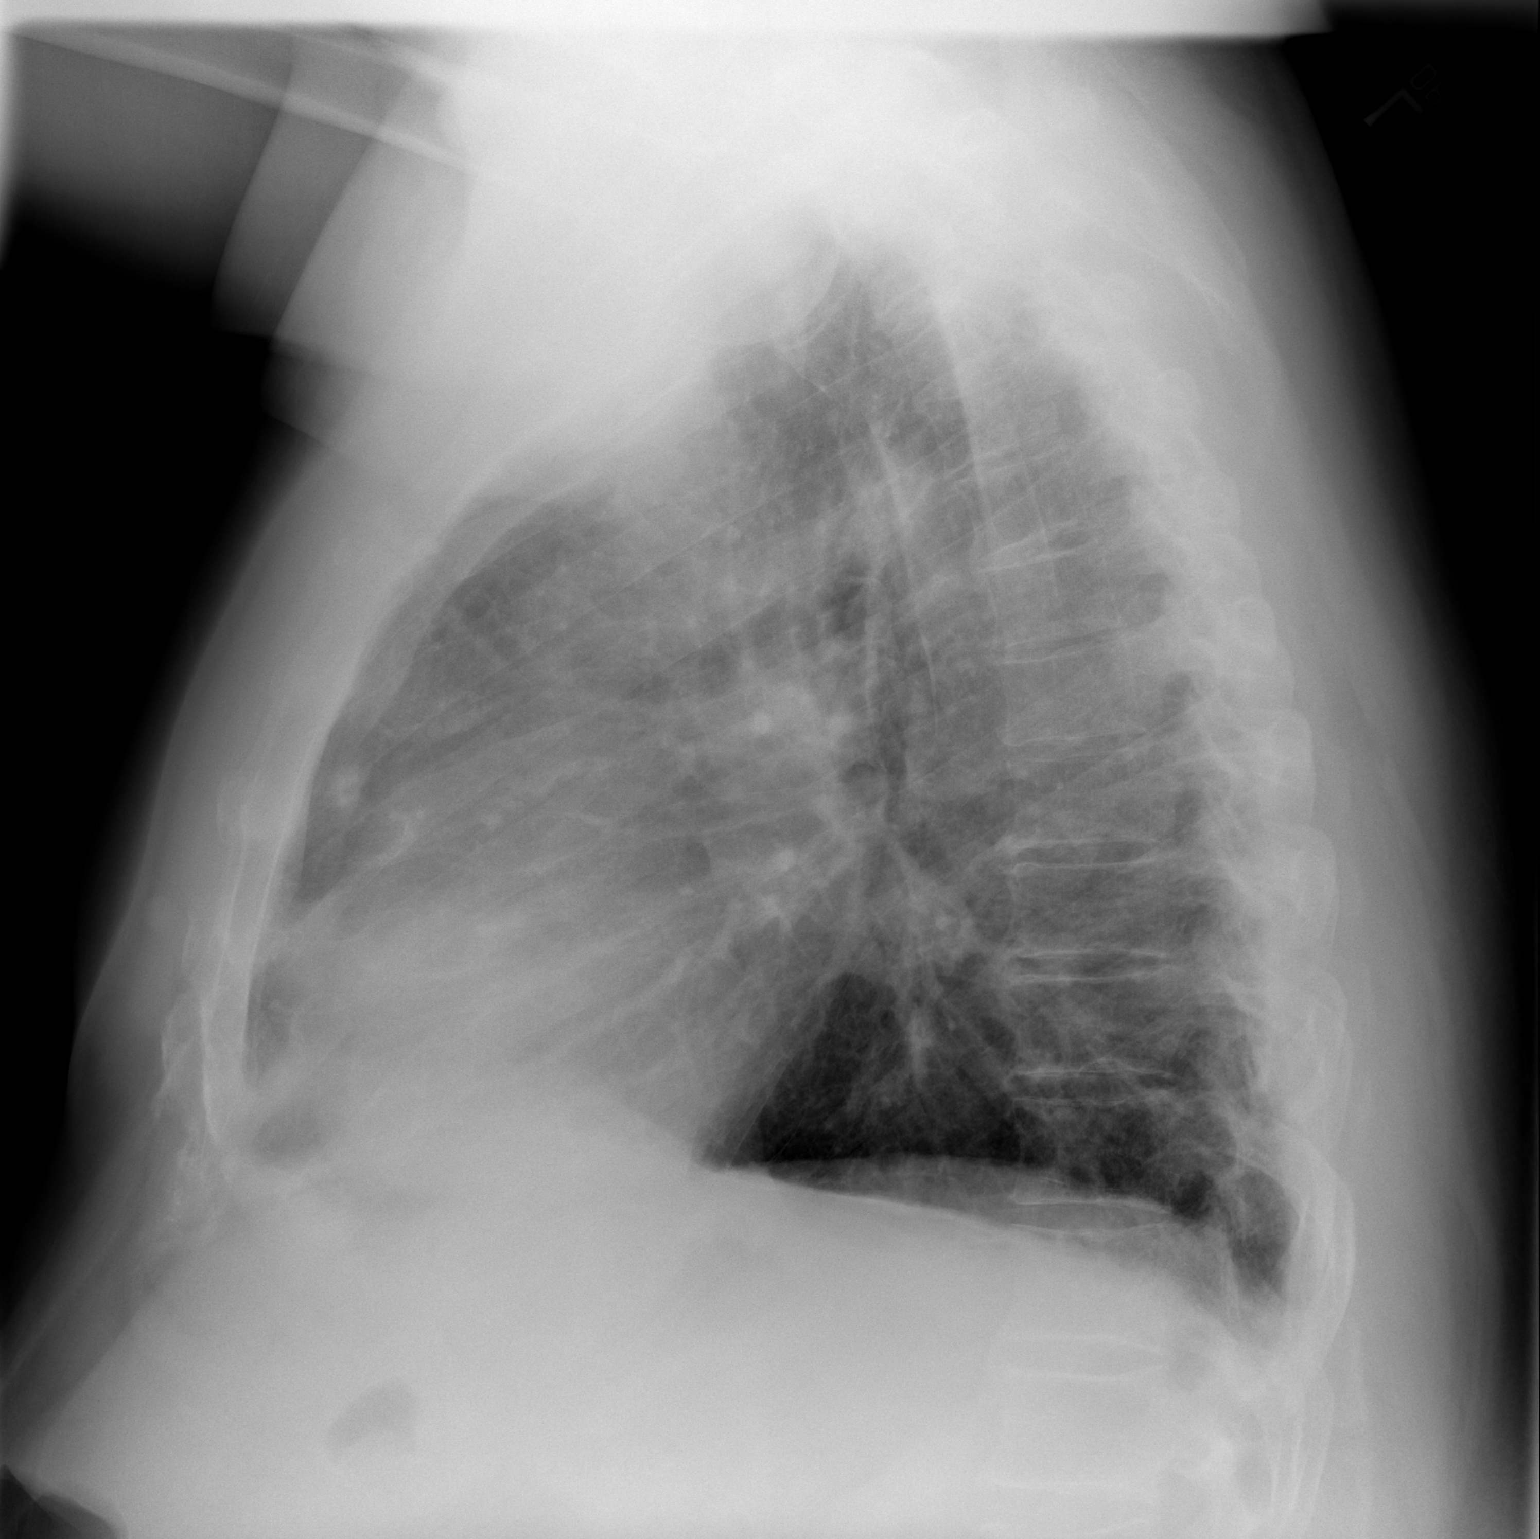

[2 of 2 positions shown; findings below may reference images not displayed]

FINDINGS: Heart size and vascularity are normal. Negative for pneumonia.
Diffuse left pleural thickening is stable. No effusion or mass.
IMPRESSION: No active cardiopulmonary disease.

## 2013-12-27 ENCOUNTER — Other Ambulatory Visit: Payer: Self-pay | Admitting: Family Medicine

## 2013-12-27 DIAGNOSIS — R9389 Abnormal findings on diagnostic imaging of other specified body structures: Secondary | ICD-10-CM

## 2013-12-28 DIAGNOSIS — Z1389 Encounter for screening for other disorder: Secondary | ICD-10-CM | POA: Diagnosis not present

## 2013-12-31 ENCOUNTER — Ambulatory Visit
Admission: RE | Admit: 2013-12-31 | Discharge: 2013-12-31 | Disposition: A | Discharge status: Home / Self Care | Payer: Medicare Other | Source: Ambulatory Visit | Attending: Family Medicine | Admitting: Family Medicine

## 2013-12-31 DIAGNOSIS — R05 Cough: Secondary | ICD-10-CM | POA: Diagnosis not present

## 2013-12-31 DIAGNOSIS — J920 Pleural plaque with presence of asbestos: Secondary | ICD-10-CM | POA: Diagnosis not present

## 2013-12-31 DIAGNOSIS — K449 Diaphragmatic hernia without obstruction or gangrene: Secondary | ICD-10-CM | POA: Diagnosis not present

## 2013-12-31 DIAGNOSIS — R9389 Abnormal findings on diagnostic imaging of other specified body structures: Secondary | ICD-10-CM

## 2013-12-31 DIAGNOSIS — I2510 Atherosclerotic heart disease of native coronary artery without angina pectoris: Secondary | ICD-10-CM | POA: Diagnosis not present

## 2013-12-31 IMAGING — CT CT CHEST W/ CM
2 of 3 series · 14 of 31 positions shown, 16 images · IV contrast (75CC OMNI 300)
Comparison: No priors.

CLINICAL DATA: 80-year-old male with 25 year history of asbestos
exposure, with chronic cough.

EXAM:
CT CHEST WITH CONTRAST
TECHNIQUE: Multidetector CT imaging of the chest was performed during
intravenous contrast administration.
CONTRAST:  75mL OMNIPAQUE IOHEXOL 300 MG/ML  SOLN

[Series 3: chest with · axial · 0.86mm/px · z∈[-306,-66]mm · 6 of 68 slices shown, 8 images]
[im 10/68  mediastinal]
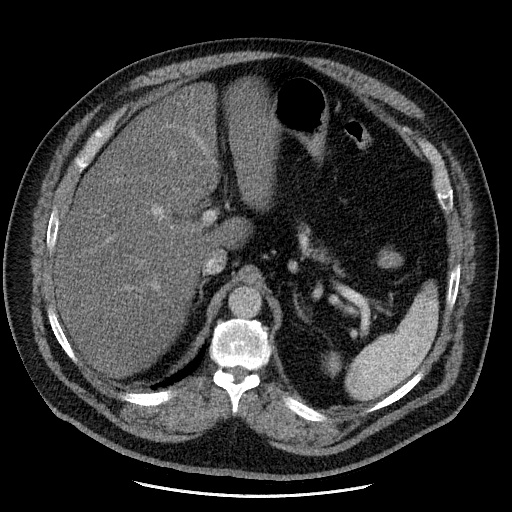
[im 10/68  lung]
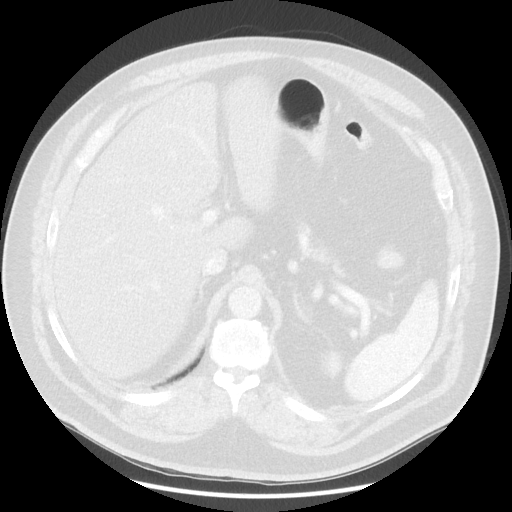
[im 20/68  lung]
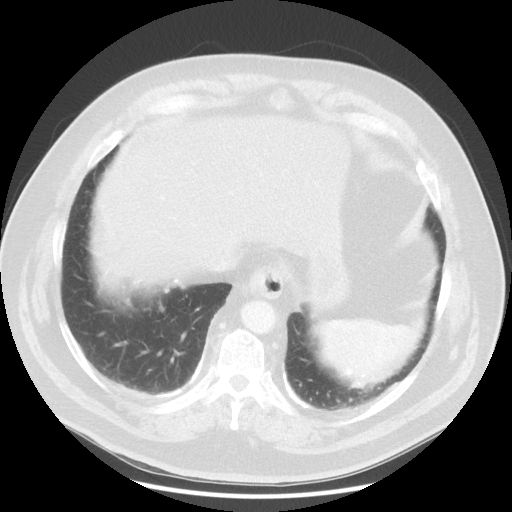
[im 29/68  lung]
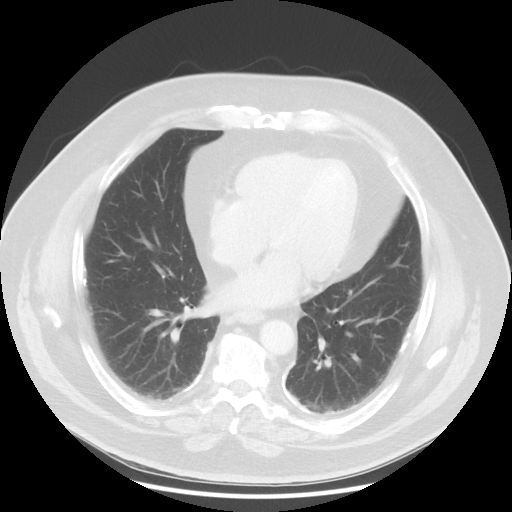
[im 39/68  lung]
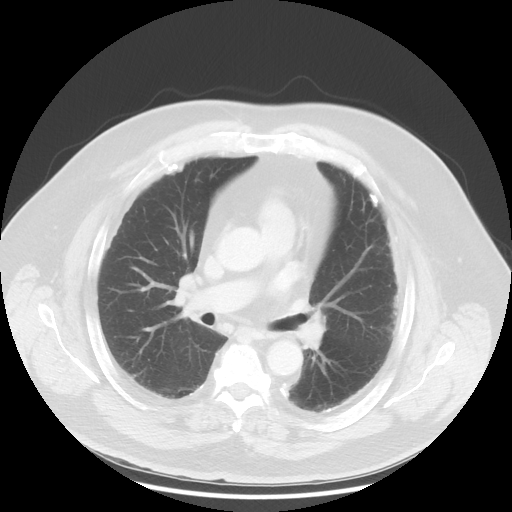
[im 48/68  mediastinal]
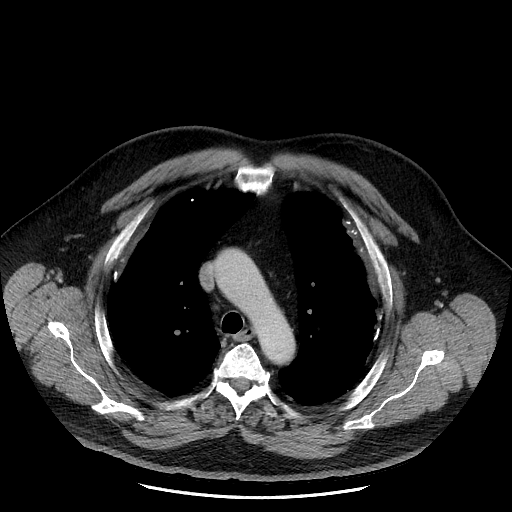
[im 48/68  lung]
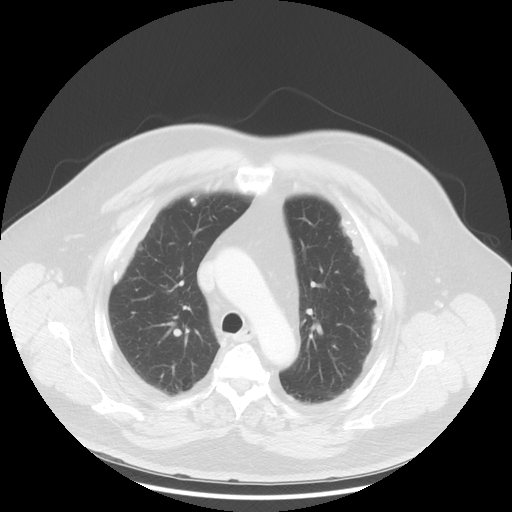
[im 58/68  lung]
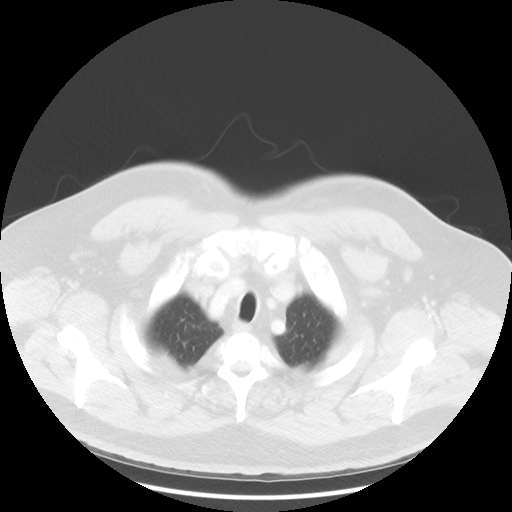

[Series 602: sagittal body · sagittal · 0.86mm/px · 8 of 177 slices shown]
[im 19/177  mediastinal]
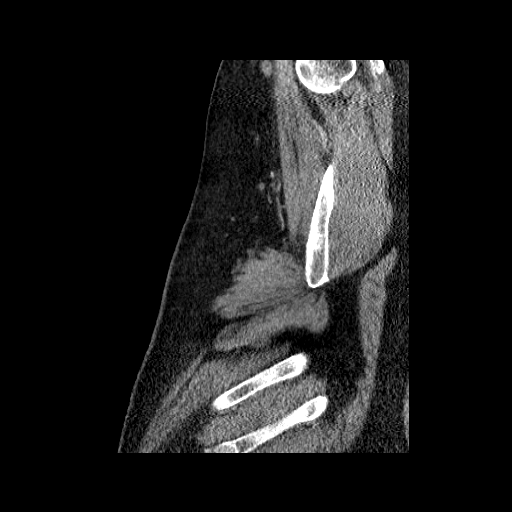
[im 38/177  mediastinal]
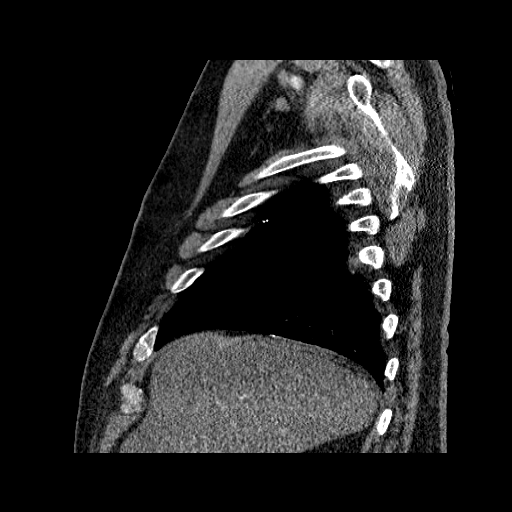
[im 56/177  mediastinal]
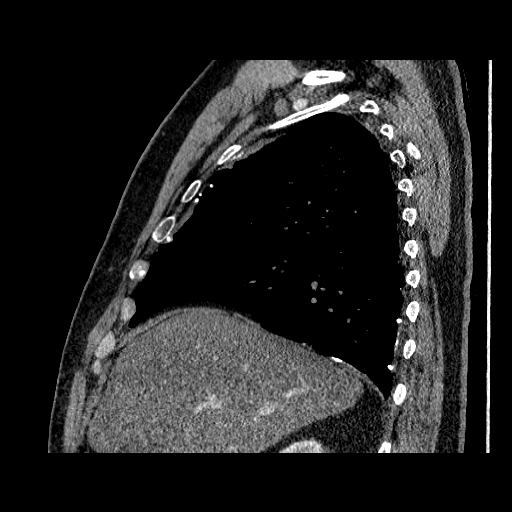
[im 84/177  mediastinal]
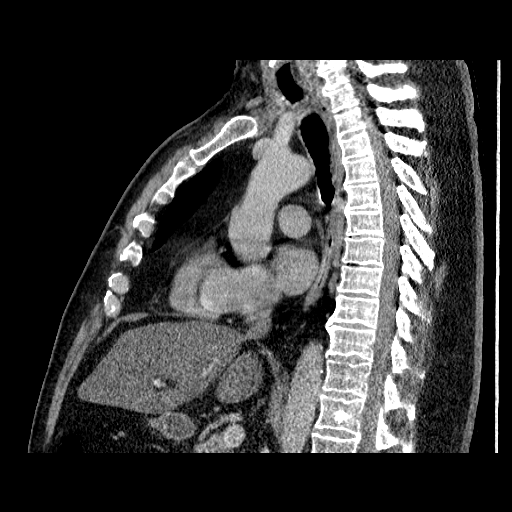
[im 93/177  mediastinal]
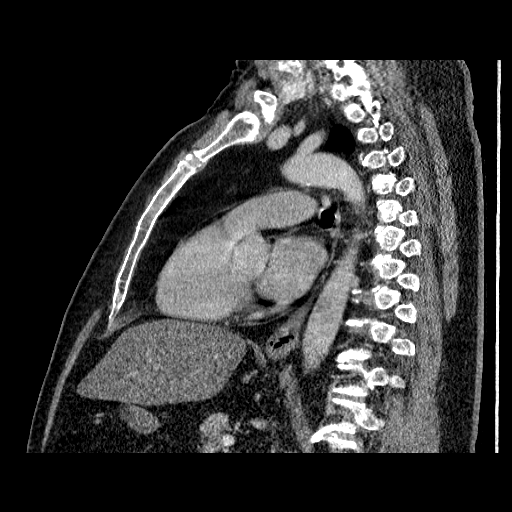
[im 121/177  mediastinal]
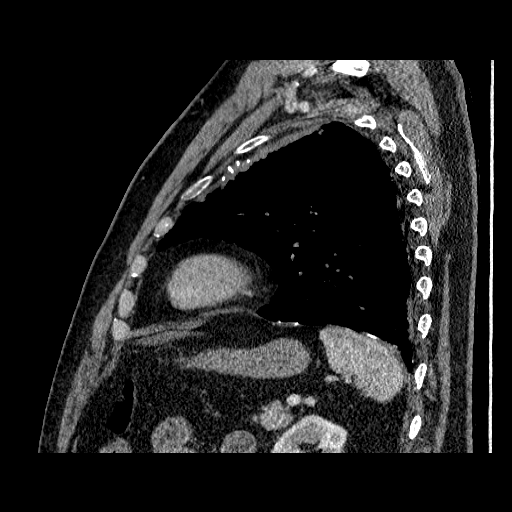
[im 139/177  mediastinal]
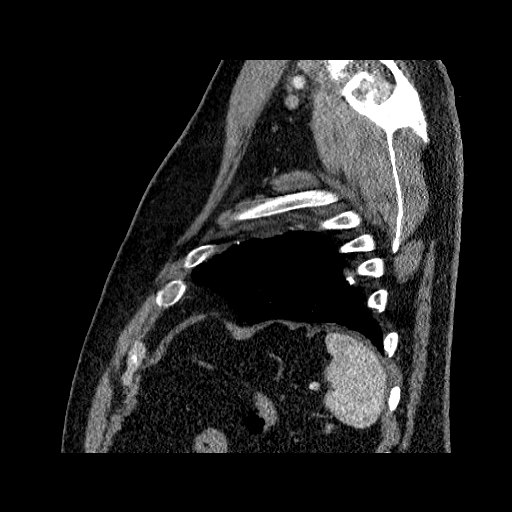
[im 158/177  mediastinal]
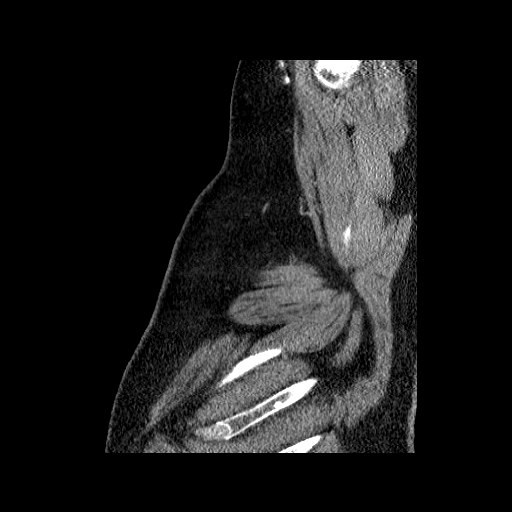

[14 of 31 positions shown; findings below may reference images not displayed]

FINDINGS: Mediastinum: Heart size is normal. There is no significant
pericardial fluid, thickening or pericardial calcification. There is
atherosclerosis of the thoracic aorta, the great vessels of the
mediastinum and the coronary arteries, including calcified
atherosclerotic plaque in the left anterior descending, left
circumflex and right coronary arteries. No pathologically enlarged
mediastinal or hilar lymph nodes. Small hiatal hernia.

Lungs/Pleura: Numerous calcified pleural plaques throughout the
lungs bilaterally, compatible with asbestos related pleural disease.
No fibrotic changes in the lungs to suggest interstitial lung
disease such is asbestosis at this time. No acute consolidative
airspace disease. No pleural effusions. No suspicious appearing
pulmonary nodules or masses.

Upper Abdomen: Diffuse decreased attenuation throughout the hepatic
parenchyma, compatible with hepatic steatosis.

Musculoskeletal: There are no aggressive appearing lytic or blastic
lesions noted in the visualized portions of the skeleton.
IMPRESSION: 1. Extensive bilateral calcified pleural plaques compatible with
asbestos related pleural disease. No stigmata of asbestosis or other
interstitial lung disease at this time.
2. Atherosclerosis, including 3 vessel coronary artery disease.
Assessment for potential risk factor modification, dietary therapy
or pharmacologic therapy may be warranted, if clinically indicated.
3. Severe hepatic steatosis.
4. Small hiatal hernia.

## 2013-12-31 MED ORDER — IOHEXOL 300 MG/ML  SOLN
75.0000 mL | Freq: Once | INTRAMUSCULAR | Status: AC | PRN
  Administered 2013-12-31: 75 mL via INTRAVENOUS

## 2014-01-04 DIAGNOSIS — Z23 Encounter for immunization: Secondary | ICD-10-CM | POA: Diagnosis not present

## 2014-03-16 DIAGNOSIS — J181 Lobar pneumonia, unspecified organism: Secondary | ICD-10-CM | POA: Diagnosis not present

## 2014-04-07 DIAGNOSIS — J181 Lobar pneumonia, unspecified organism: Secondary | ICD-10-CM | POA: Diagnosis not present

## 2014-04-25 DIAGNOSIS — Z6841 Body Mass Index (BMI) 40.0 and over, adult: Secondary | ICD-10-CM | POA: Diagnosis not present

## 2014-04-25 DIAGNOSIS — N400 Enlarged prostate without lower urinary tract symptoms: Secondary | ICD-10-CM | POA: Diagnosis not present

## 2014-04-25 DIAGNOSIS — R609 Edema, unspecified: Secondary | ICD-10-CM | POA: Diagnosis not present

## 2014-04-25 DIAGNOSIS — E782 Mixed hyperlipidemia: Secondary | ICD-10-CM | POA: Diagnosis not present

## 2014-10-27 DIAGNOSIS — E782 Mixed hyperlipidemia: Secondary | ICD-10-CM | POA: Diagnosis not present

## 2014-12-05 DIAGNOSIS — Z23 Encounter for immunization: Secondary | ICD-10-CM | POA: Diagnosis not present

## 2014-12-06 ENCOUNTER — Inpatient Hospital Stay (HOSPITAL_COMMUNITY)
Admission: EM | Admit: 2014-12-06 | Discharge: 2014-12-15 | DRG: 184 | Disposition: A | Discharge status: Home / Self Care | Payer: Medicare Other | Attending: General Surgery | Admitting: General Surgery

## 2014-12-06 ENCOUNTER — Encounter (HOSPITAL_COMMUNITY): Payer: Self-pay | Admitting: Emergency Medicine

## 2014-12-06 ENCOUNTER — Emergency Department (HOSPITAL_COMMUNITY): Payer: Medicare Other

## 2014-12-06 ENCOUNTER — Inpatient Hospital Stay (HOSPITAL_COMMUNITY): Payer: Medicare Other

## 2014-12-06 DIAGNOSIS — E785 Hyperlipidemia, unspecified: Secondary | ICD-10-CM | POA: Diagnosis present

## 2014-12-06 DIAGNOSIS — K567 Ileus, unspecified: Secondary | ICD-10-CM | POA: Diagnosis not present

## 2014-12-06 DIAGNOSIS — Z96652 Presence of left artificial knee joint: Secondary | ICD-10-CM | POA: Diagnosis present

## 2014-12-06 DIAGNOSIS — R0902 Hypoxemia: Secondary | ICD-10-CM | POA: Diagnosis not present

## 2014-12-06 DIAGNOSIS — R140 Abdominal distension (gaseous): Secondary | ICD-10-CM

## 2014-12-06 DIAGNOSIS — S301XXA Contusion of abdominal wall, initial encounter: Secondary | ICD-10-CM | POA: Diagnosis present

## 2014-12-06 DIAGNOSIS — M1990 Unspecified osteoarthritis, unspecified site: Secondary | ICD-10-CM | POA: Diagnosis present

## 2014-12-06 DIAGNOSIS — R042 Hemoptysis: Secondary | ICD-10-CM | POA: Diagnosis not present

## 2014-12-06 DIAGNOSIS — K5780 Diverticulitis of intestine, part unspecified, with perforation and abscess without bleeding: Secondary | ICD-10-CM | POA: Diagnosis not present

## 2014-12-06 DIAGNOSIS — Z7982 Long term (current) use of aspirin: Secondary | ICD-10-CM

## 2014-12-06 DIAGNOSIS — S2249XA Multiple fractures of ribs, unspecified side, initial encounter for closed fracture: Secondary | ICD-10-CM | POA: Diagnosis not present

## 2014-12-06 DIAGNOSIS — K219 Gastro-esophageal reflux disease without esophagitis: Secondary | ICD-10-CM | POA: Diagnosis present

## 2014-12-06 DIAGNOSIS — R1110 Vomiting, unspecified: Secondary | ICD-10-CM | POA: Diagnosis not present

## 2014-12-06 DIAGNOSIS — S2242XA Multiple fractures of ribs, left side, initial encounter for closed fracture: Principal | ICD-10-CM | POA: Diagnosis present

## 2014-12-06 DIAGNOSIS — S298XXA Other specified injuries of thorax, initial encounter: Secondary | ICD-10-CM | POA: Diagnosis present

## 2014-12-06 DIAGNOSIS — R079 Chest pain, unspecified: Secondary | ICD-10-CM | POA: Diagnosis not present

## 2014-12-06 DIAGNOSIS — R1012 Left upper quadrant pain: Secondary | ICD-10-CM | POA: Diagnosis not present

## 2014-12-06 HISTORY — DX: Multiple fractures of ribs, unspecified side, initial encounter for closed fracture: S22.49XA

## 2014-12-06 HISTORY — DX: Fracture of one rib, unspecified side, initial encounter for closed fracture: S22.39XA

## 2014-12-06 LAB — CBC WITH DIFFERENTIAL/PLATELET
Basophils Absolute: 0 10*3/uL (ref 0.0–0.1)
Basophils Relative: 0 %
EOS ABS: 0 10*3/uL (ref 0.0–0.7)
EOS PCT: 0 %
HCT: 39.3 % (ref 39.0–52.0)
Hemoglobin: 12.2 g/dL — ABNORMAL LOW (ref 13.0–17.0)
LYMPHS ABS: 0.6 10*3/uL — AB (ref 0.7–4.0)
LYMPHS PCT: 5 %
MCH: 27.1 pg (ref 26.0–34.0)
MCHC: 31 g/dL (ref 30.0–36.0)
MCV: 87.3 fL (ref 78.0–100.0)
MONO ABS: 0.4 10*3/uL (ref 0.1–1.0)
MONOS PCT: 4 %
Neutro Abs: 9.3 10*3/uL — ABNORMAL HIGH (ref 1.7–7.7)
Neutrophils Relative %: 91 %
PLATELETS: 169 10*3/uL (ref 150–400)
RBC: 4.5 MIL/uL (ref 4.22–5.81)
RDW: 16.4 % — AB (ref 11.5–15.5)
WBC: 10.2 10*3/uL (ref 4.0–10.5)

## 2014-12-06 LAB — I-STAT CHEM 8, ED
BUN: 22 mg/dL — ABNORMAL HIGH (ref 6–20)
CALCIUM ION: 1.14 mmol/L (ref 1.13–1.30)
CHLORIDE: 102 mmol/L (ref 101–111)
Creatinine, Ser: 1.2 mg/dL (ref 0.61–1.24)
GLUCOSE: 190 mg/dL — AB (ref 65–99)
HCT: 42 % (ref 39.0–52.0)
HEMOGLOBIN: 14.3 g/dL (ref 13.0–17.0)
Potassium: 4.7 mmol/L (ref 3.5–5.1)
SODIUM: 137 mmol/L (ref 135–145)
TCO2: 22 mmol/L (ref 0–100)

## 2014-12-06 LAB — COMPREHENSIVE METABOLIC PANEL
ALBUMIN: 3.8 g/dL (ref 3.5–5.0)
ALT: 38 U/L (ref 17–63)
AST: 45 U/L — AB (ref 15–41)
Alkaline Phosphatase: 67 U/L (ref 38–126)
Anion gap: 7 (ref 5–15)
BUN: 22 mg/dL — AB (ref 6–20)
CHLORIDE: 104 mmol/L (ref 101–111)
CO2: 24 mmol/L (ref 22–32)
CREATININE: 1.36 mg/dL — AB (ref 0.61–1.24)
Calcium: 8.4 mg/dL — ABNORMAL LOW (ref 8.9–10.3)
GFR calc Af Amer: 55 mL/min — ABNORMAL LOW (ref >60)
GFR calc non Af Amer: 48 mL/min — ABNORMAL LOW (ref >60)
GLUCOSE: 186 mg/dL — AB (ref 65–99)
POTASSIUM: 4.6 mmol/L (ref 3.5–5.1)
Sodium: 135 mmol/L (ref 135–145)
Total Bilirubin: 0.5 mg/dL (ref 0.3–1.2)
Total Protein: 7.6 g/dL (ref 6.5–8.1)

## 2014-12-06 IMAGING — CR DG ABD PORTABLE 2V
2 series · 2 of 2 positions shown · non-contrast
Comparison: CT of the abdomen and pelvis performed earlier today at
[DATE] a.m.

CLINICAL DATA: Abdominal distention, acute onset. Initial
encounter.

EXAM:
PORTABLE ABDOMEN - 2 VIEW

[AP]
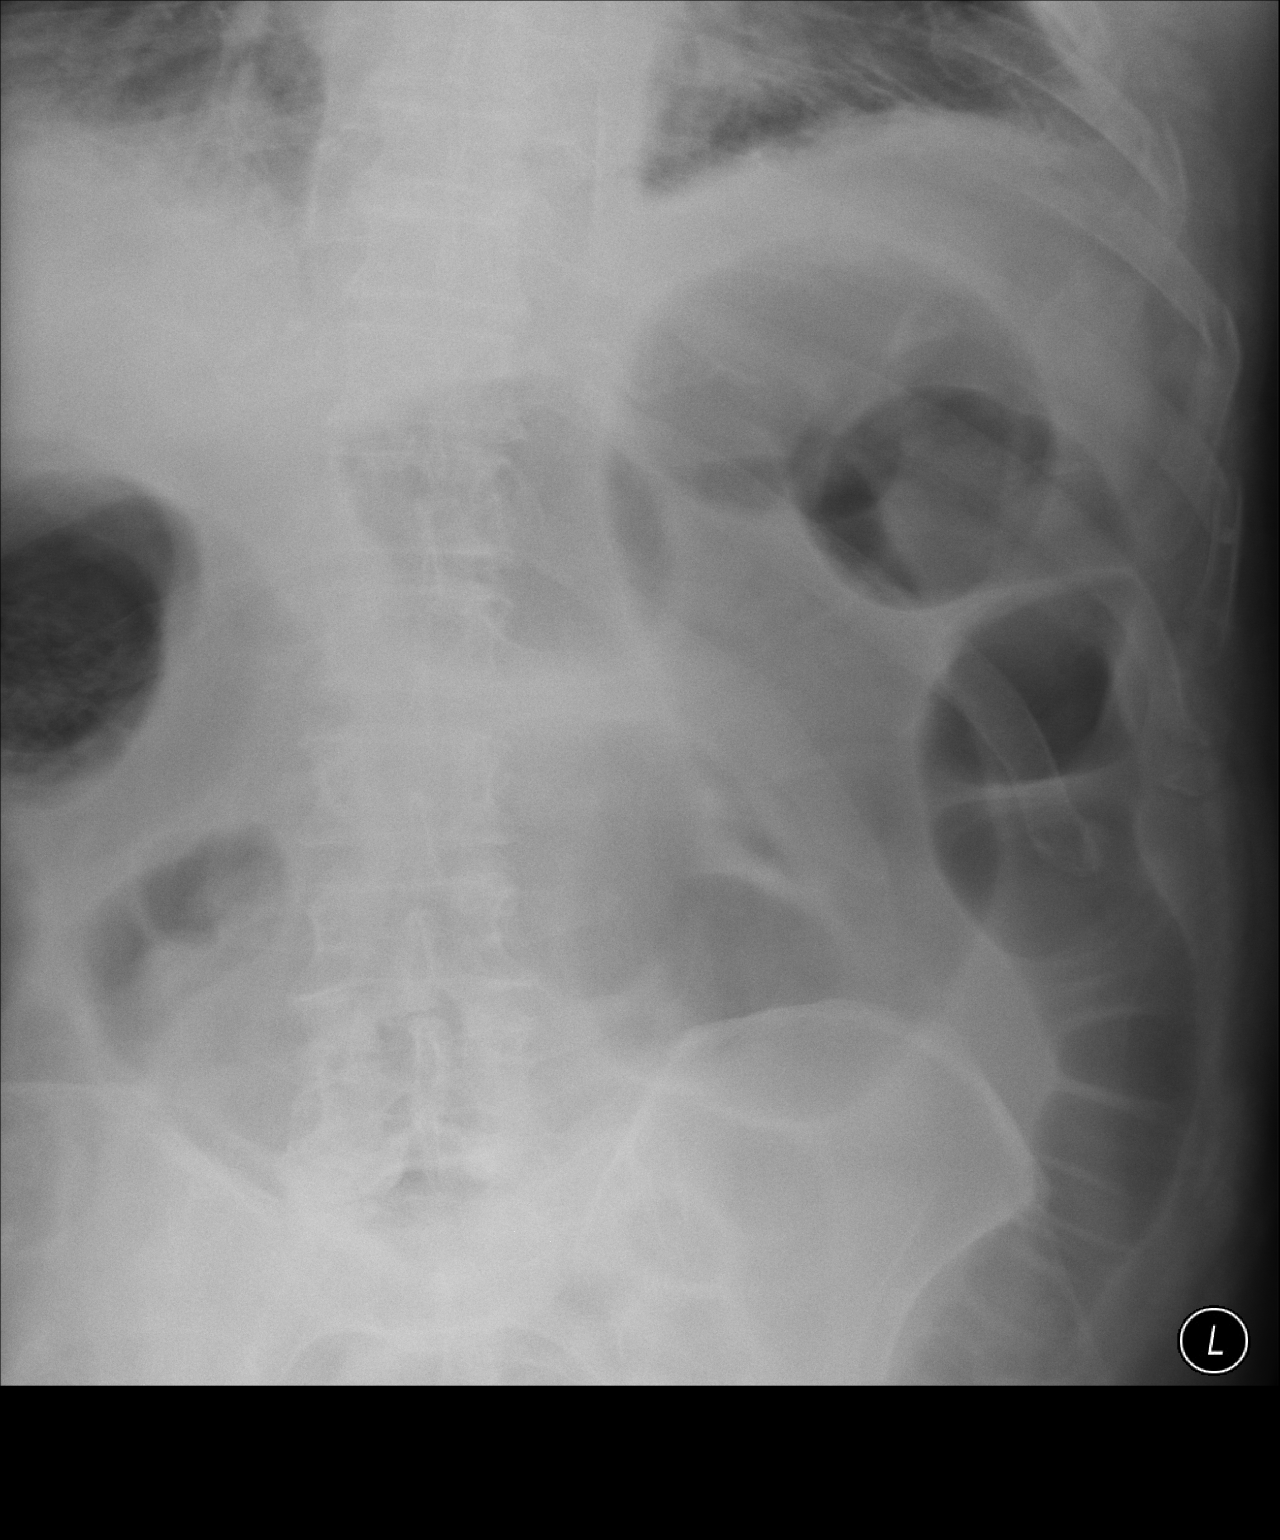

[ap lld]
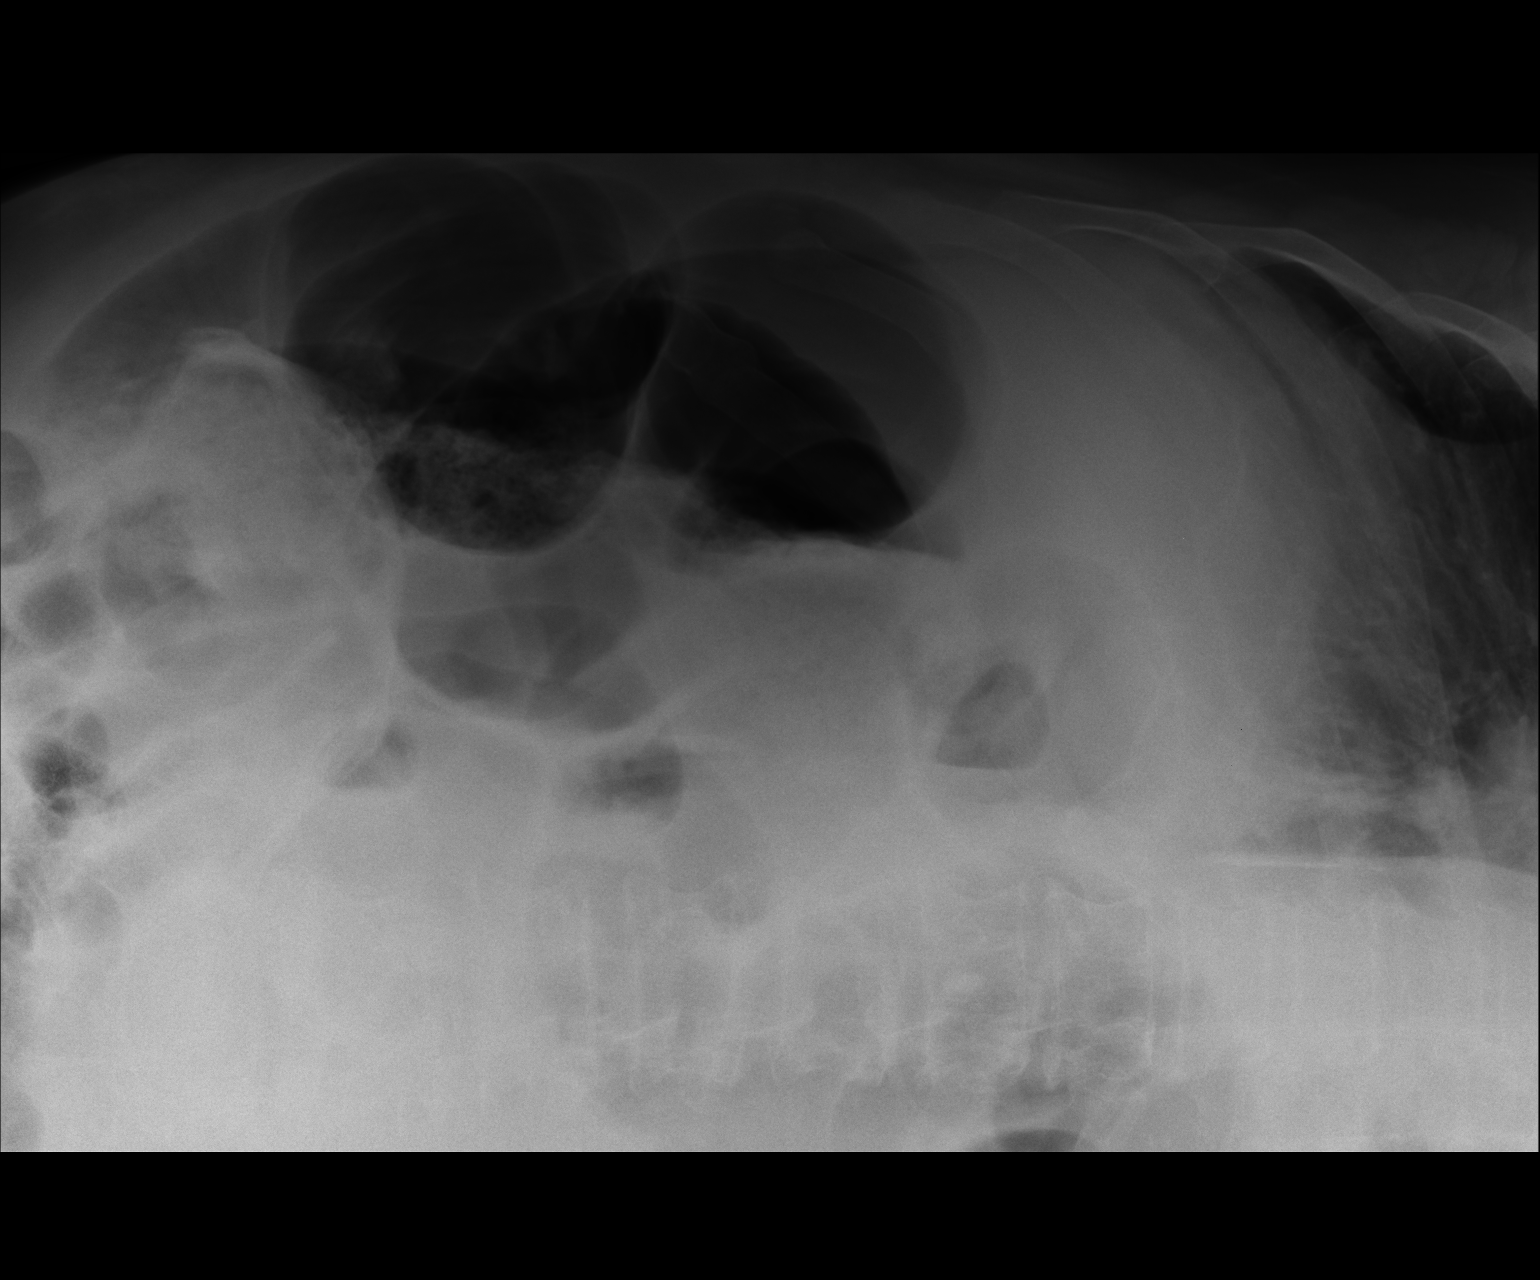

[2 of 2 positions shown; findings below may reference images not displayed]

FINDINGS: There is distention of small and large bowel loops with air, new
from from the prior study and likely reflecting ileus. There is no
definite evidence of obstruction. No free intra-abdominal air is
seen on the provided decubitus view.

No acute osseous abnormalities are identified. Mild degenerative
change is noted at the lower lumbar spine. Pleural thickening is
noted at the lung bases.
IMPRESSION: Distention of small and large bowel loops with air, new from the
recent prior study and likely reflecting ileus. No evidence for
bowel obstruction. No free intra-abdominal air seen.
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
## 2014-12-06 IMAGING — DX DG CHEST 1V PORT
2 series · 2 of 2 positions shown · non-contrast
Comparison: None.

CLINICAL DATA: Hypoxia.

EXAM:
PORTABLE CHEST 1 VIEW

[chest ap (1 of 2)]
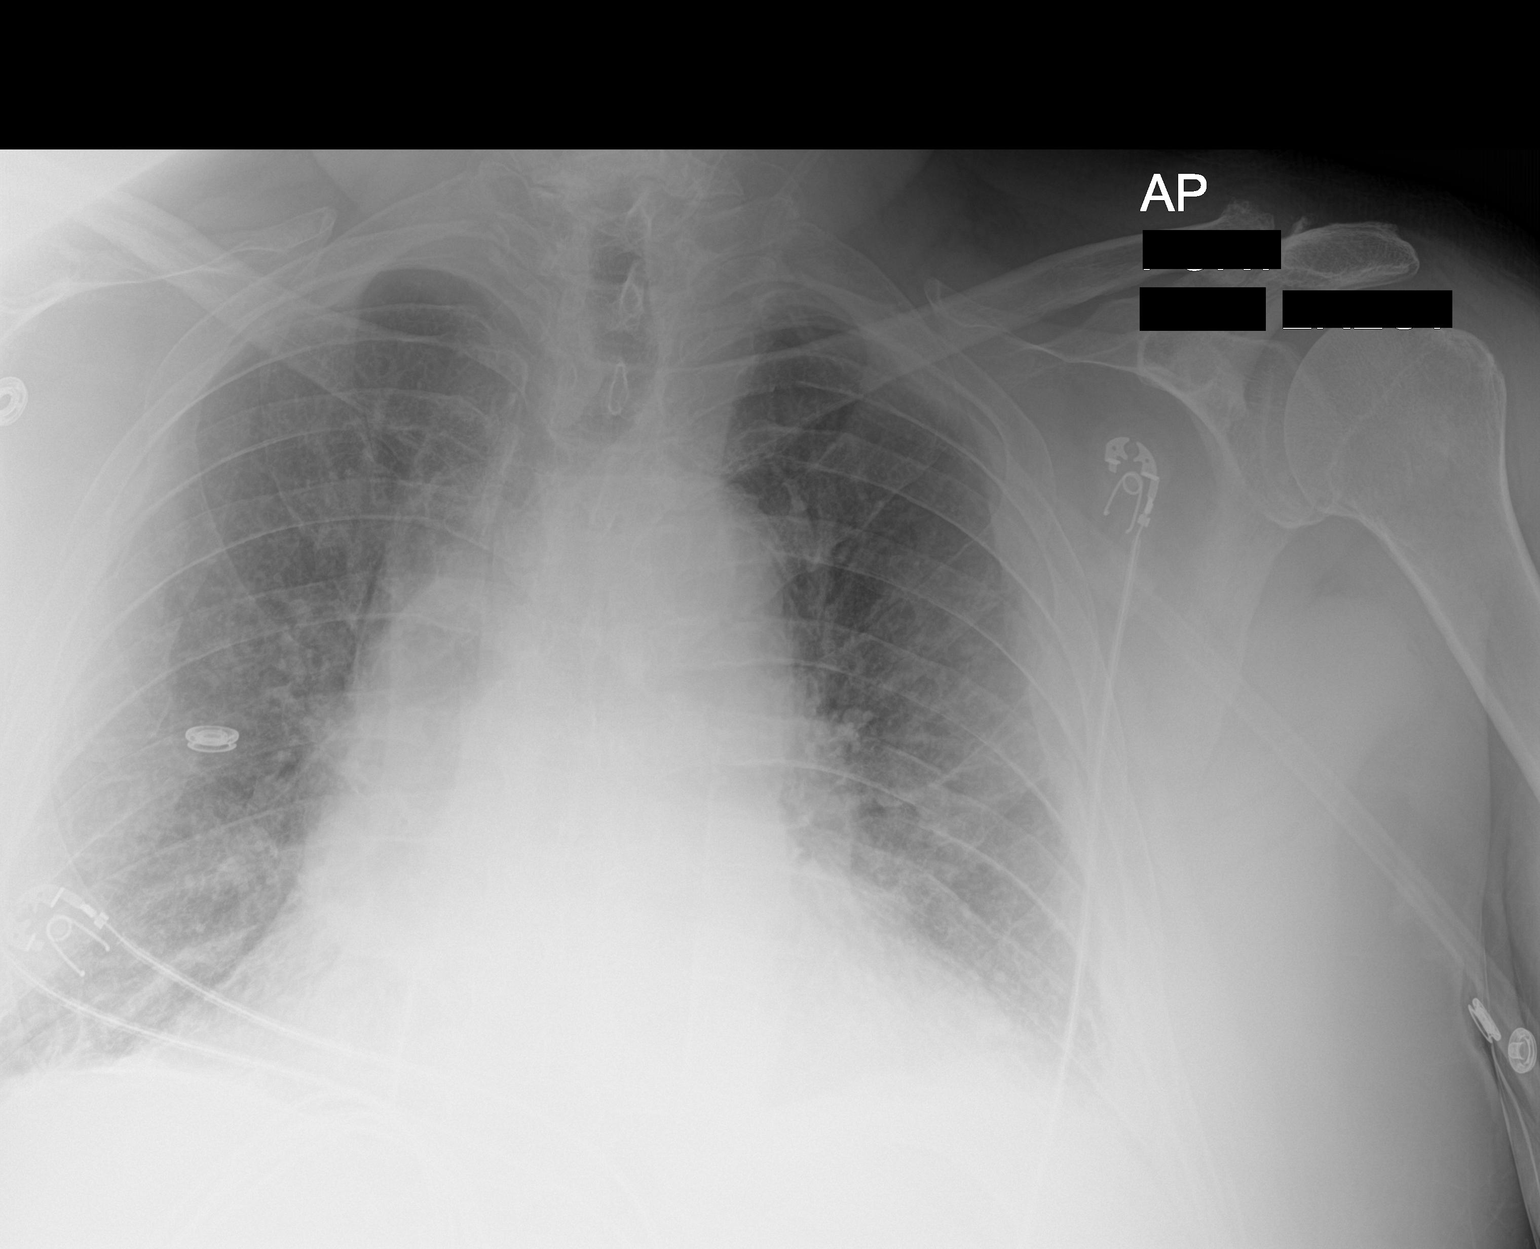

[chest ap (2 of 2)]
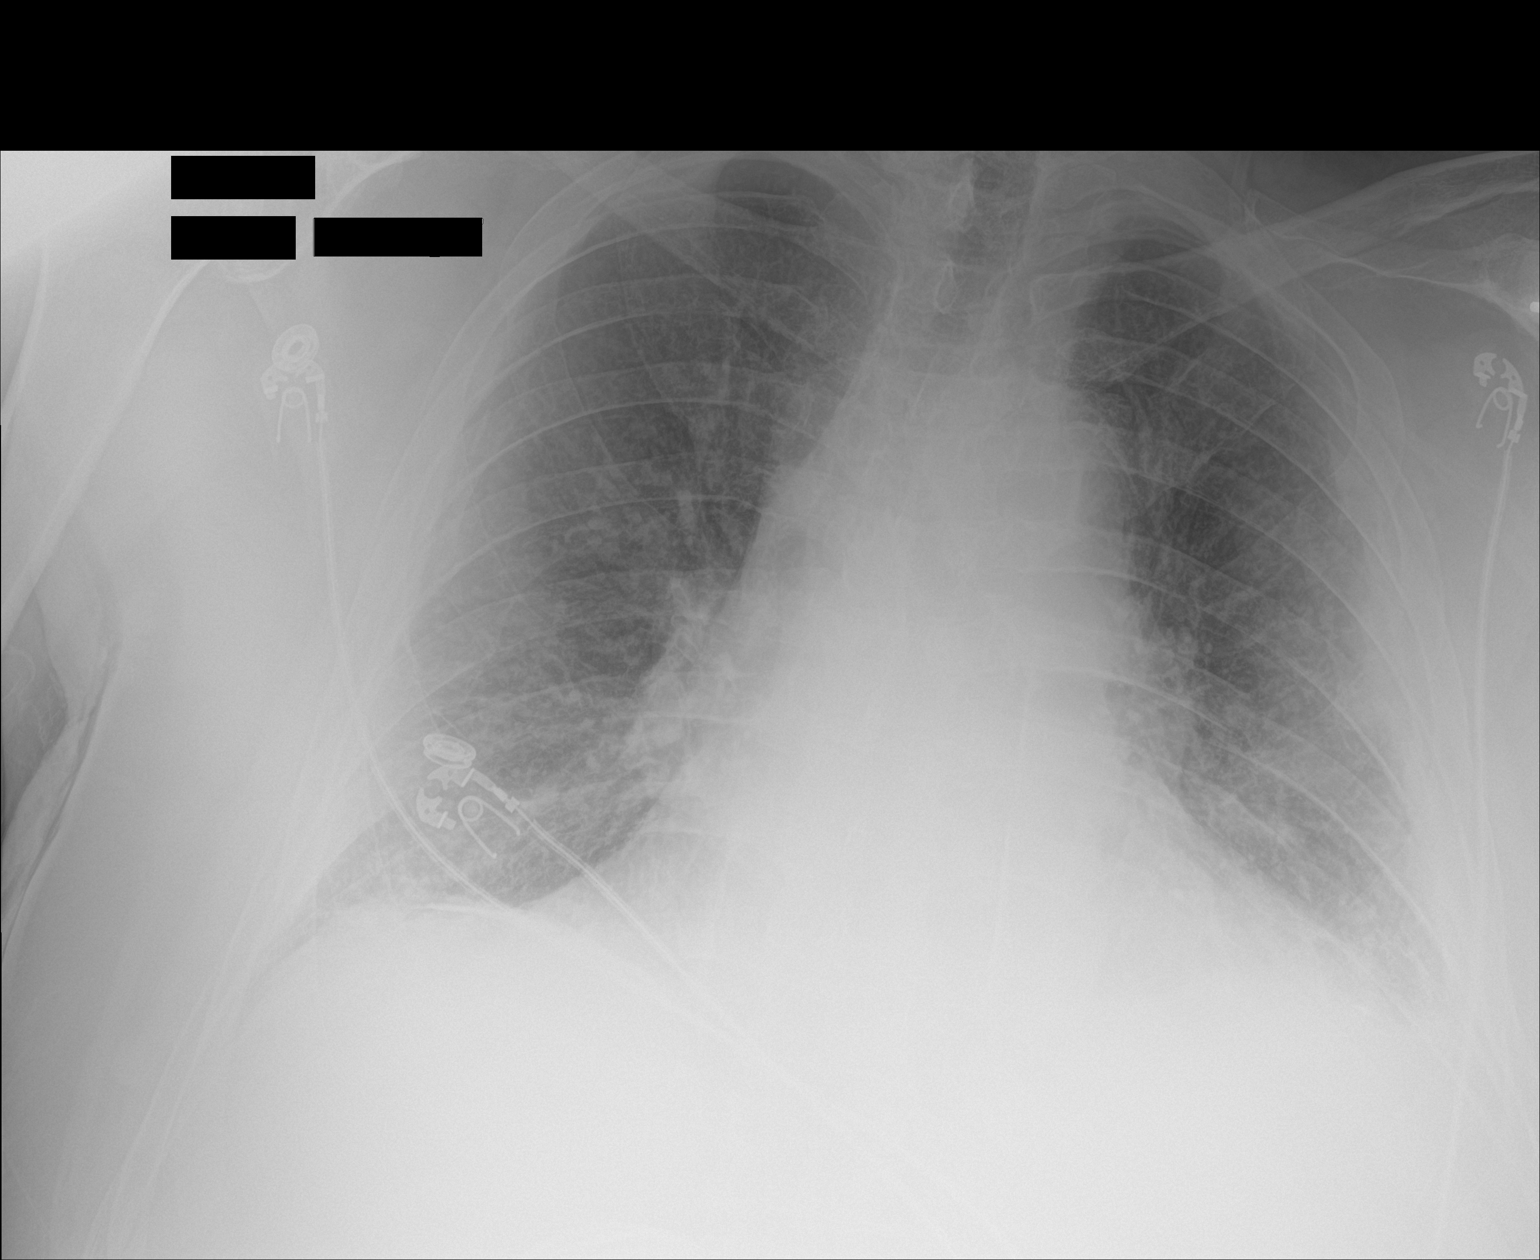

[2 of 2 positions shown; findings below may reference images not displayed]

FINDINGS: Cardiomegaly with diffuse mild interstitial prominence consistent
with mild congestive heart failure. Bilateral small pleural
effusions are noted. No pneumothorax. Calcified pleural plaque noted
on the right. Prior asbestos exposure could present in this fashion.
IMPRESSION: 1. Findings consistent with congestive heart failure with pulmonary
interstitial edema. Other etiologies of interstitial lung disease
cannot be excluded. Small bilateral pleural effusions are present.
2. Calcified pleural plaques on the right. This suggests prior
asbestos exposure.
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
## 2014-12-06 MED ORDER — SODIUM CHLORIDE 0.9 % IJ SOLN: 9.0000 mL | INTRAMUSCULAR | Status: DC | PRN

## 2014-12-06 MED ORDER — DIPHENHYDRAMINE HCL 50 MG/ML IJ SOLN: 12.5000 mg | Freq: Four times a day (QID) | INTRAMUSCULAR | Status: DC | PRN

## 2014-12-06 MED ORDER — PANTOPRAZOLE SODIUM 40 MG PO TBEC
40.0000 mg | DELAYED_RELEASE_TABLET | Freq: Every day | ORAL | Status: DC
  Administered 2014-12-06 – 2014-12-15 (×7): 40 mg via ORAL
  Filled 2014-12-06 (×9): qty 1

## 2014-12-06 MED ORDER — ONDANSETRON HCL 4 MG PO TABS: 4.0000 mg | ORAL_TABLET | Freq: Four times a day (QID) | ORAL | Status: DC | PRN

## 2014-12-06 MED ORDER — NALOXONE HCL 0.4 MG/ML IJ SOLN: 0.4000 mg | INTRAMUSCULAR | Status: DC | PRN

## 2014-12-06 MED ORDER — FENTANYL CITRATE (PF) 100 MCG/2ML IJ SOLN
50.0000 ug | Freq: Once | INTRAMUSCULAR | Status: AC
  Administered 2014-12-06: 50 ug via INTRAVENOUS
  Filled 2014-12-06: qty 2

## 2014-12-06 MED ORDER — ONDANSETRON HCL 4 MG/2ML IJ SOLN
4.0000 mg | Freq: Once | INTRAMUSCULAR | Status: AC
  Administered 2014-12-06: 4 mg via INTRAVENOUS
  Filled 2014-12-06: qty 2

## 2014-12-06 MED ORDER — SODIUM CHLORIDE 0.9 % IV SOLN
INTRAVENOUS | Status: DC
  Administered 2014-12-06 – 2014-12-08 (×4): via INTRAVENOUS
  Administered 2014-12-08: 100 mL/h via INTRAVENOUS
  Administered 2014-12-09 – 2014-12-14 (×8): via INTRAVENOUS

## 2014-12-06 MED ORDER — IOHEXOL 300 MG/ML  SOLN
100.0000 mL | Freq: Once | INTRAMUSCULAR | Status: AC | PRN
  Administered 2014-12-06: 100 mL via INTRAVENOUS

## 2014-12-06 MED ORDER — DIPHENHYDRAMINE HCL 12.5 MG/5ML PO ELIX: 12.5000 mg | ORAL_SOLUTION | Freq: Four times a day (QID) | ORAL | Status: DC | PRN

## 2014-12-06 MED ORDER — DOCUSATE SODIUM 100 MG PO CAPS
100.0000 mg | ORAL_CAPSULE | Freq: Two times a day (BID) | ORAL | Status: DC
  Administered 2014-12-06 – 2014-12-15 (×17): 100 mg via ORAL
  Filled 2014-12-06 (×17): qty 1

## 2014-12-06 MED ORDER — POLYETHYLENE GLYCOL 3350 17 G PO PACK
17.0000 g | PACK | Freq: Every day | ORAL | Status: DC
  Administered 2014-12-06 – 2014-12-15 (×9): 17 g via ORAL
  Filled 2014-12-06 (×10): qty 1

## 2014-12-06 MED ORDER — PANTOPRAZOLE SODIUM 40 MG IV SOLR
40.0000 mg | Freq: Every day | INTRAVENOUS | Status: DC
  Administered 2014-12-07 – 2014-12-12 (×3): 40 mg via INTRAVENOUS
  Filled 2014-12-06 (×6): qty 40

## 2014-12-06 MED ORDER — HYDROMORPHONE HCL 1 MG/ML IJ SOLN
1.0000 mg | Freq: Once | INTRAMUSCULAR | Status: AC
  Administered 2014-12-06: 1 mg via INTRAVENOUS
  Filled 2014-12-06: qty 1

## 2014-12-06 MED ORDER — ONDANSETRON HCL 4 MG/2ML IJ SOLN
4.0000 mg | Freq: Four times a day (QID) | INTRAMUSCULAR | Status: DC | PRN
  Administered 2014-12-06 – 2014-12-10 (×3): 4 mg via INTRAVENOUS
  Filled 2014-12-06 (×3): qty 2

## 2014-12-06 MED ORDER — ACETAMINOPHEN 325 MG PO TABS
325.0000 mg | ORAL_TABLET | Freq: Four times a day (QID) | ORAL | Status: DC | PRN
  Administered 2014-12-06: 650 mg via ORAL
  Filled 2014-12-06: qty 2

## 2014-12-06 MED ORDER — MORPHINE SULFATE 1 MG/ML IV SOLN
INTRAVENOUS | Status: DC
  Administered 2014-12-06: 3 mg via INTRAVENOUS
  Administered 2014-12-06: 4.5 mg via INTRAVENOUS
  Administered 2014-12-06: 13:00:00 via INTRAVENOUS
  Administered 2014-12-07: 4.5 mg via INTRAVENOUS
  Administered 2014-12-07: 0 mg via INTRAVENOUS
  Administered 2014-12-07: 21:00:00 via INTRAVENOUS
  Administered 2014-12-07: 4.5 mg via INTRAVENOUS
  Administered 2014-12-07: 0 mg via INTRAVENOUS
  Administered 2014-12-07: 1.5 mg via INTRAVENOUS
  Filled 2014-12-06 (×2): qty 25

## 2014-12-06 MED ORDER — ENOXAPARIN SODIUM 30 MG/0.3ML ~~LOC~~ SOLN
30.0000 mg | Freq: Two times a day (BID) | SUBCUTANEOUS | Status: DC
  Administered 2014-12-06 – 2014-12-15 (×18): 30 mg via SUBCUTANEOUS
  Filled 2014-12-06 (×17): qty 0.3
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
## 2014-12-06 NOTE — ED Notes (Signed)
Attempted report to 6N 

## 2014-12-06 NOTE — ED Notes (Signed)
Paged Dr. Sheliah Hatch to [phone]
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
## 2014-12-06 NOTE — Progress Notes (Signed)
MD Janee Morn called about pt's recent emesis and c/o abdominal pain.  Order portable abdominal film.  Will continue to monitor. Sherald Barge
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
## 2014-12-06 NOTE — ED Notes (Signed)
Radiology called to confirm that patient needed repeat cxray. Dr. Su Ley advised radiology he does want patient to have repeat xray at this time.

## 2014-12-06 NOTE — ED Notes (Signed)
Patient's monitor alarming. Patient's O2 sat was 86 percent. Patient sat is now 94 percent on 2L. Stating that as long as he is still that he is not having a lot of pain.

## 2014-12-06 NOTE — Progress Notes (Signed)
Pt vomited approximately 200cc brown emesis.  Zofran given. Sherald Barge
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
## 2014-12-06 NOTE — ED Notes (Signed)
Radiology at bedside for cxray.
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
## 2014-12-07 DIAGNOSIS — S298XXA Other specified injuries of thorax, initial encounter: Secondary | ICD-10-CM | POA: Diagnosis present

## 2014-12-07 DIAGNOSIS — K567 Ileus, unspecified: Secondary | ICD-10-CM | POA: Diagnosis not present

## 2014-12-07 LAB — BASIC METABOLIC PANEL
Anion gap: 9 (ref 5–15)
BUN: 17 mg/dL (ref 6–20)
CHLORIDE: 100 mmol/L — AB (ref 101–111)
CO2: 27 mmol/L (ref 22–32)
CREATININE: 1.2 mg/dL (ref 0.61–1.24)
Calcium: 8.3 mg/dL — ABNORMAL LOW (ref 8.9–10.3)
GFR calc non Af Amer: 56 mL/min — ABNORMAL LOW (ref >60)
GLUCOSE: 149 mg/dL — AB (ref 65–99)
Potassium: 4.4 mmol/L (ref 3.5–5.1)
Sodium: 136 mmol/L (ref 135–145)

## 2014-12-07 LAB — CBC
HEMATOCRIT: 37.3 % — AB (ref 39.0–52.0)
HEMOGLOBIN: 11.6 g/dL — AB (ref 13.0–17.0)
MCH: 27.5 pg (ref 26.0–34.0)
MCHC: 31.1 g/dL (ref 30.0–36.0)
MCV: 88.4 fL (ref 78.0–100.0)
Platelets: DECREASED 10*3/uL (ref 150–400)
RBC: 4.22 MIL/uL (ref 4.22–5.81)
RDW: 16.7 % — ABNORMAL HIGH (ref 11.5–15.5)
WBC: 9.1 10*3/uL (ref 4.0–10.5)

## 2014-12-07 MED ORDER — CHLORHEXIDINE GLUCONATE 0.12 % MT SOLN
15.0000 mL | Freq: Two times a day (BID) | OROMUCOSAL | Status: DC
  Administered 2014-12-07 – 2014-12-14 (×13): 15 mL via OROMUCOSAL
  Filled 2014-12-07 (×14): qty 15

## 2014-12-07 MED ORDER — NAPROXEN 250 MG PO TABS
500.0000 mg | ORAL_TABLET | Freq: Two times a day (BID) | ORAL | Status: DC
  Administered 2014-12-07 – 2014-12-15 (×16): 500 mg via ORAL
  Filled 2014-12-07 (×15): qty 2

## 2014-12-07 MED ORDER — PHENOL 1.4 % MT LIQD
1.0000 | OROMUCOSAL | Status: DC | PRN
  Administered 2014-12-07: 1 via OROMUCOSAL
  Filled 2014-12-07: qty 177

## 2014-12-07 MED ORDER — CETYLPYRIDINIUM CHLORIDE 0.05 % MT LIQD
7.0000 mL | Freq: Two times a day (BID) | OROMUCOSAL | Status: DC
  Administered 2014-12-07 – 2014-12-14 (×12): 7 mL via OROMUCOSAL
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
## 2014-12-07 NOTE — Progress Notes (Signed)
Pt vommitted once this morning. Vomitus looks brown.Prn zofran administered for nausea. Also had some more loose stoolsx1
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
## 2014-12-07 NOTE — Progress Notes (Signed)
11fr NG tube inserted as ordered with no difficulty. Pt tolerated procedure fairly well. Tube placement verified by auscultation and immediate gastric contents noted. NGT has already put out 850cc and is connected to low wall suction. Pt npo and denies pain or nausea at this time. Family at bedside. No concerns voiced

## 2014-12-07 NOTE — Progress Notes (Signed)
Pt c/o increased distention and states "I'm going to explode if you don't do something".  No c/o nausea at this time.  PA notified and new order received.  Will carry out and continue to monitor.  Hector Sh[place]
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
## 2014-12-08 MED ORDER — MORPHINE SULFATE (PF) 2 MG/ML IV SOLN
2.0000 mg | INTRAVENOUS | Status: DC | PRN
Start: 2014-12-08 — End: 2014-12-13
  Administered 2014-12-08 (×3): 4 mg via INTRAVENOUS
  Administered 2014-12-09: 2 mg via INTRAVENOUS
  Administered 2014-12-09: 4 mg via INTRAVENOUS
  Administered 2014-12-09: 2 mg via INTRAVENOUS
  Administered 2014-12-09 – 2014-12-10 (×3): 4 mg via INTRAVENOUS
  Administered 2014-12-10: 6 mg via INTRAVENOUS
  Administered 2014-12-10 – 2014-12-11 (×2): 4 mg via INTRAVENOUS
  Administered 2014-12-12: 6 mg via INTRAVENOUS
  Administered 2014-12-13: 4 mg via INTRAVENOUS
  Administered 2014-12-13: 6 mg via INTRAVENOUS
  Filled 2014-12-08 (×2): qty 1
  Filled 2014-12-08 (×2): qty 2
  Filled 2014-12-08 (×2): qty 3
  Filled 2014-12-08: qty 2
  Filled 2014-12-08: qty 1
  Filled 2014-12-08: qty 2
  Filled 2014-12-08: qty 1
  Filled 2014-12-08 (×4): qty 2
  Filled 2014-12-08: qty 3
  Filled 2014-12-08: qty 2

## 2014-12-08 MED ORDER — BISACODYL 10 MG RE SUPP
10.0000 mg | Freq: Every day | RECTAL | Status: DC
  Administered 2014-12-08 – 2014-12-12 (×4): 10 mg via RECTAL
  Filled 2014-12-08 (×4): qty 1
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
## 2014-12-09 ENCOUNTER — Inpatient Hospital Stay (HOSPITAL_COMMUNITY): Payer: Medicare Other

## 2014-12-09 LAB — GLUCOSE, CAPILLARY: Glucose-Capillary: 90 mg/dL (ref 65–99)

## 2014-12-09 IMAGING — DX DG ABD PORTABLE 1V
1 series · 1 of 1 positions shown · non-contrast
Comparison: [DATE]

CLINICAL DATA: Evaluate ileus

EXAM:
PORTABLE ABDOMEN - 1 VIEW

[abdomen kub]
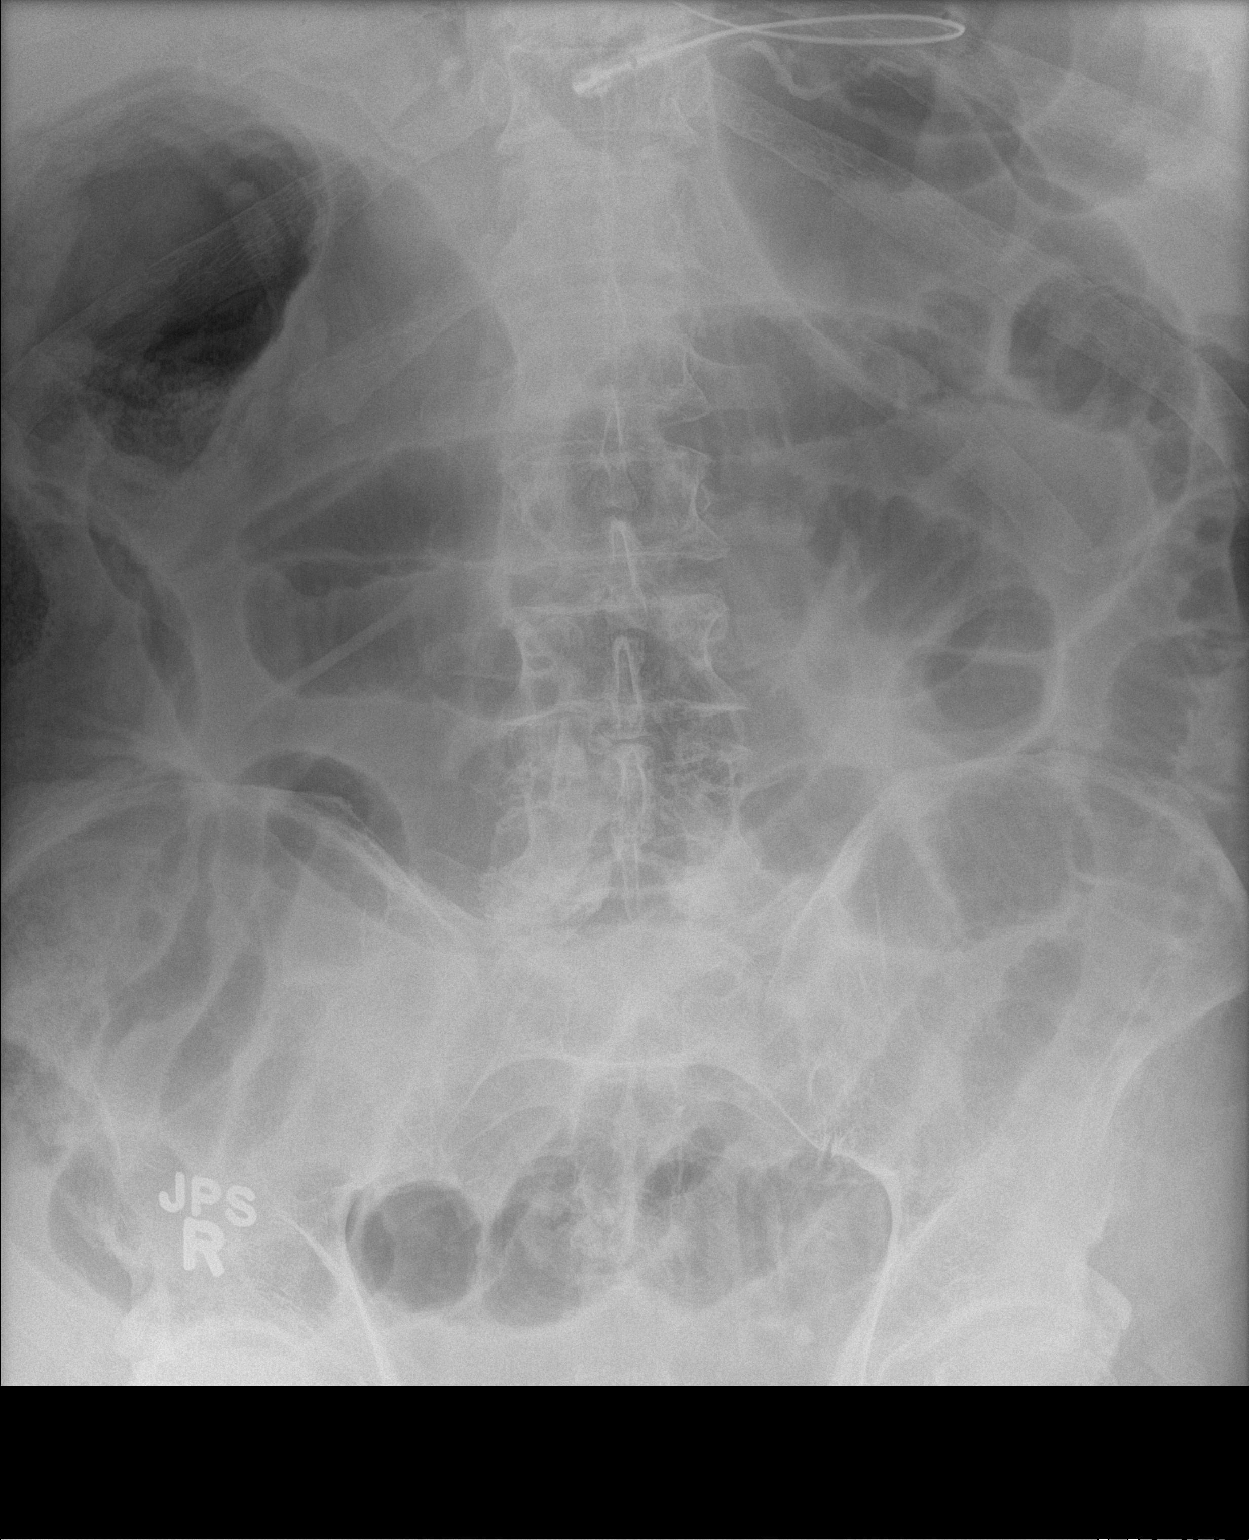

[1 of 1 positions shown; findings below may reference images not displayed]

FINDINGS: There is a nasogastric tube with tip in the stomach. Diffuse gaseous
distension of the large and small bowel loops identified compatible
with either ileus or distal bowel obstruction.
IMPRESSION: 1. Dilated loops of large and small bowel compatible with ileus or
distal bowel obstruction.

## 2014-12-09 NOTE — Care Management Important Message (Signed)
Important Message  Patient Details  Name: Angel Norman MRN: [id number] Date of Birth: October 04, 1932   Medicare Important Message Given:  Yes-second notification given    Orson Aloe 12/09/2014, 11:21 AM
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
## 2014-12-09 NOTE — Progress Notes (Signed)
PT Cancellation Note  Patient Details Name: Angel Norman MRN: [id number] DOB: 05-29-1932   Cancelled Treatment:    Reason Eval/Treat Not Completed: Fatigue/lethargy limiting ability to participate.  Attempted to see patient x2 today.  Patient declined due to fatigue and coughing.  Will return at later date for PT session.   [name] 12/09/2014, 7:41 PM Durenda Hurt. Renaldo Fiddler, [facility] Acute Rehab Services Pager [phone]
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
## 2014-12-10 ENCOUNTER — Inpatient Hospital Stay (HOSPITAL_COMMUNITY): Payer: Medicare Other
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
## 2014-12-11 LAB — CBC
HCT: 36.5 % — ABNORMAL LOW (ref 39.0–52.0)
Hemoglobin: 11 g/dL — ABNORMAL LOW (ref 13.0–17.0)
MCH: 26.9 pg (ref 26.0–34.0)
MCHC: 30.1 g/dL (ref 30.0–36.0)
MCV: 89.2 fL (ref 78.0–100.0)
PLATELETS: 214 10*3/uL (ref 150–400)
RBC: 4.09 MIL/uL — AB (ref 4.22–5.81)
RDW: 17 % — AB (ref 11.5–15.5)
WBC: 3.9 10*3/uL — ABNORMAL LOW (ref 4.0–10.5)

## 2014-12-11 LAB — BASIC METABOLIC PANEL
Anion gap: 7 (ref 5–15)
BUN: 18 mg/dL (ref 6–20)
CO2: 30 mmol/L (ref 22–32)
CREATININE: 0.95 mg/dL (ref 0.61–1.24)
Calcium: 8 mg/dL — ABNORMAL LOW (ref 8.9–10.3)
Chloride: 100 mmol/L — ABNORMAL LOW (ref 101–111)
GFR calc Af Amer: >60 mL/min (ref >60)
GLUCOSE: 93 mg/dL (ref 65–99)
POTASSIUM: 4 mmol/L (ref 3.5–5.1)
SODIUM: 137 mmol/L (ref 135–145)

## 2014-12-11 LAB — MAGNESIUM: MAGNESIUM: 2.3 mg/dL (ref 1.7–2.4)
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
## 2014-12-12 ENCOUNTER — Inpatient Hospital Stay (HOSPITAL_COMMUNITY): Payer: Medicare Other

## 2014-12-12 IMAGING — DX DG ABDOMEN 2V
3 series · 3 of 3 positions shown · non-contrast
Comparison: [DATE]

CLINICAL DATA: Abdominal distension, pain. Small bowel obstruction.

EXAM:
ABDOMEN - 2 VIEW

[w abdomen upright]
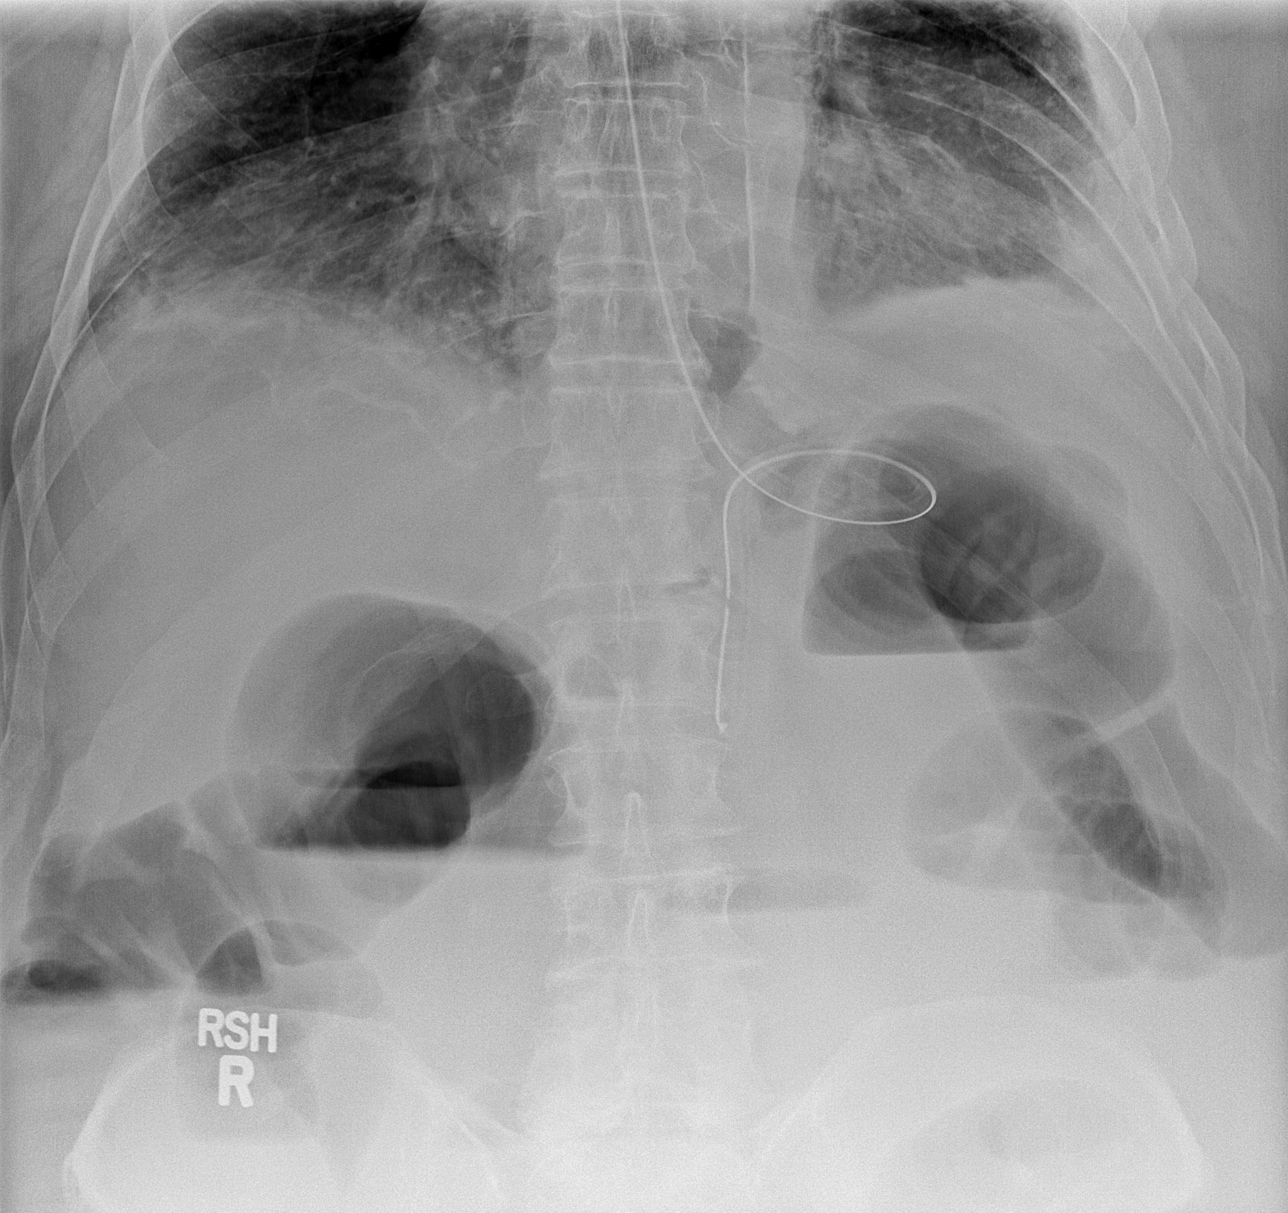

[t abdomen supine (1 of 2)]
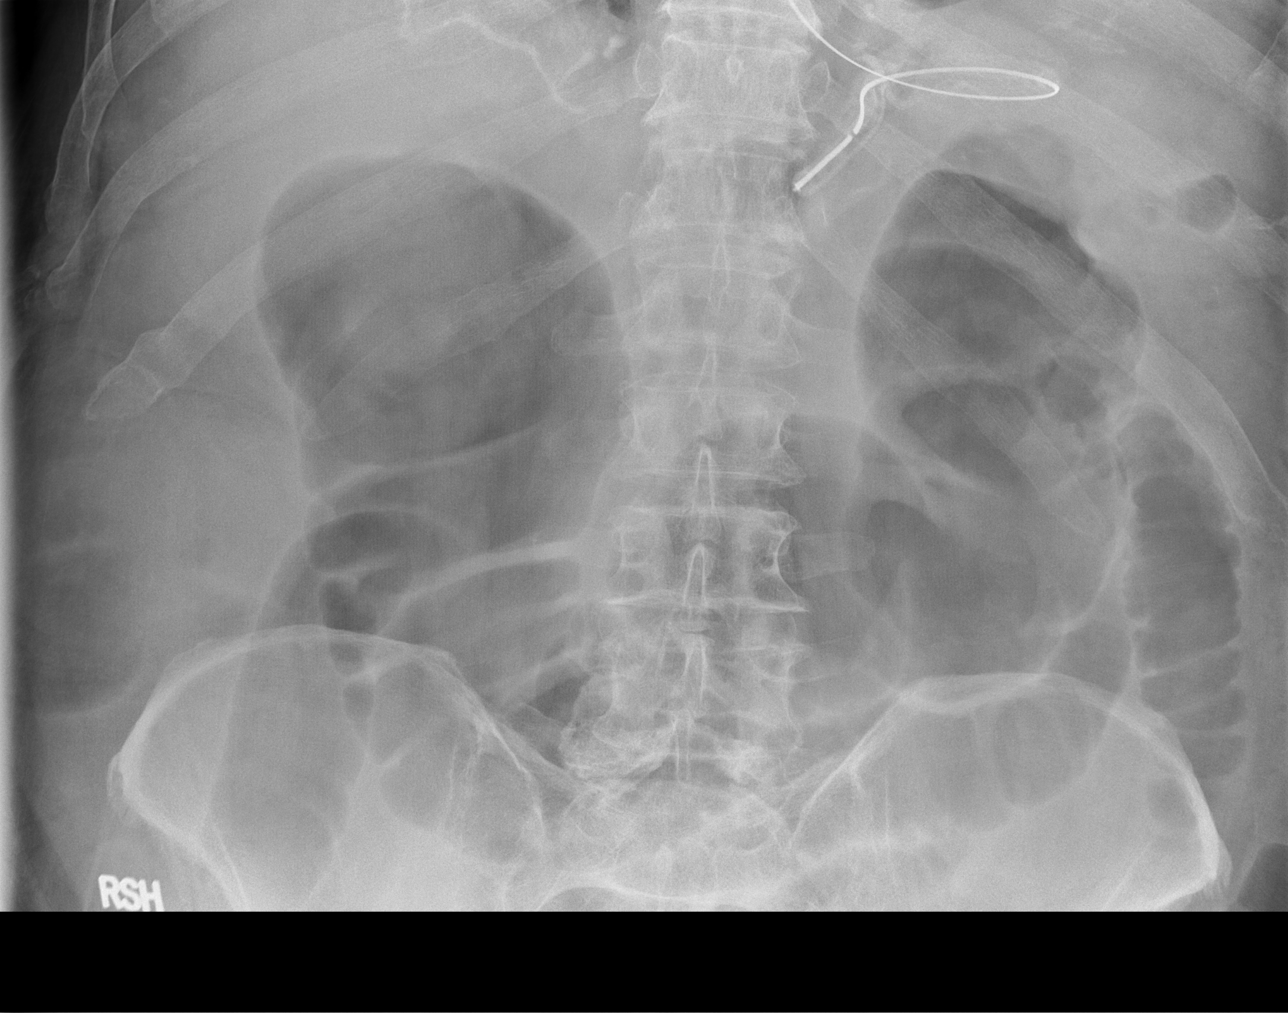

[t abdomen supine (2 of 2)]
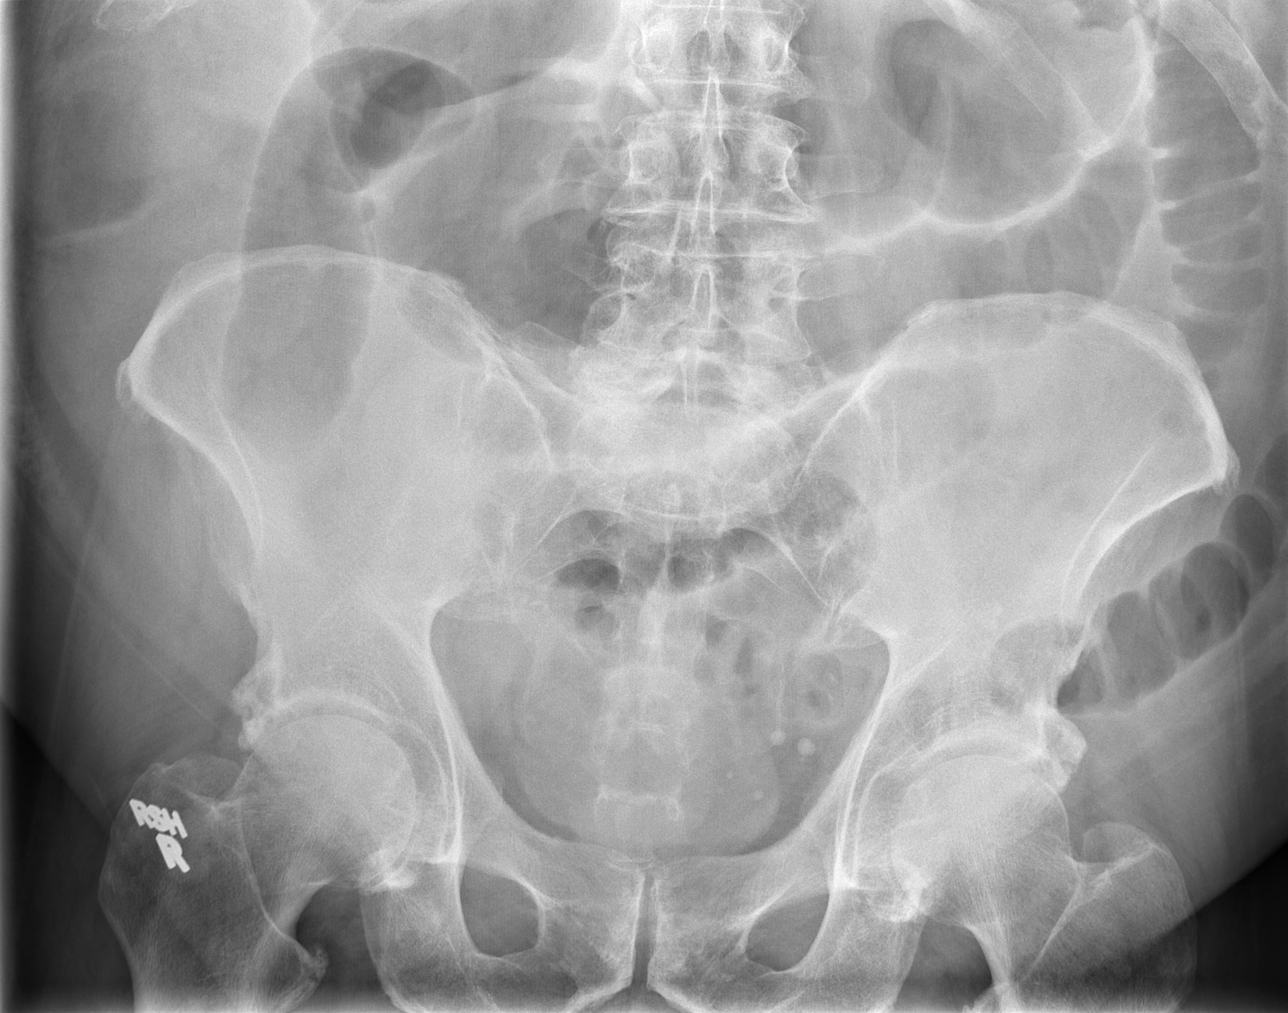

[3 of 3 positions shown; findings below may reference images not displayed]

FINDINGS: NG tube again noted in the stomach, unchanged. Continued gaseous
distention of large and small bowel, not significantly changed. No
free air. No organomegaly. Again noted are left-sided rib fractures.
IMPRESSION: Stable diffuse gaseous distention of bowel, most likely ileus.
Stable position of the NG tube.

## 2014-12-12 NOTE — Care Management Important Message (Signed)
Important Message  Patient Details  Name: Angel Norman MRN: [id number] Date of Birth: [birth year]   Medicare Important Message Given:  Yes-third notification given    Orson Aloe 12/12/2014, 2:32 PM

## 2014-12-12 NOTE — Progress Notes (Signed)
NGT tube clamped off as ordered
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
## 2014-12-12 NOTE — Progress Notes (Signed)
PT Cancellation Note  Patient Details Name: Angel Norman MRN: [id number] DOB: 1932-07-05   Cancelled Treatment:    Reason Eval/Treat Not Completed: Medical issues which prohibited therapy. Pt experiencing loose stools this AM. He reports multi trips to the BR over night and this morning. He fears getting too far from toilet with ambulation. He is now sitting in recliner.   [name] 12/12/2014, 10:19 AM
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
## 2014-12-13 LAB — CREATININE, SERUM
CREATININE: 0.78 mg/dL (ref 0.61–1.24)
GFR calc Af Amer: >60 mL/min (ref >60)

## 2014-12-13 MED ORDER — MORPHINE SULFATE (PF) 2 MG/ML IV SOLN: 2.0000 mg | INTRAVENOUS | Status: DC | PRN

## 2014-12-13 MED ORDER — BOOST / RESOURCE BREEZE PO LIQD
1.0000 | Freq: Three times a day (TID) | ORAL | Status: DC
  Administered 2014-12-13 – 2014-12-14 (×3): 1 via ORAL

## 2014-12-13 MED ORDER — OXYCODONE HCL 5 MG PO TABS
5.0000 mg | ORAL_TABLET | ORAL | Status: DC | PRN
  Administered 2014-12-13 – 2014-12-14 (×3): 15 mg via ORAL
  Filled 2014-12-13 (×3): qty 3
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
## 2014-12-15 MED ORDER — OXYCODONE HCL 10 MG PO TABS: 5.0000 mg | ORAL_TABLET | ORAL | Status: DC | PRN

## 2014-12-15 MED ORDER — POLYETHYLENE GLYCOL 3350 17 G PO PACK: 17.0000 g | PACK | Freq: Every day | ORAL | Status: DC

## 2014-12-15 MED ORDER — DOCUSATE SODIUM 100 MG PO CAPS: 100.0000 mg | ORAL_CAPSULE | Freq: Two times a day (BID) | ORAL | Status: DC

## 2014-12-15 NOTE — Care Management Important Message (Signed)
Important Message  Patient Details  Name: Angel ChancellorDavid F Norman MRN: [id number] Date of Birth: 08/11/1932   Medicare Important Message Given:  Yes-fourth notification given    Kyla BalzarineShealy, Sarahann Horrell Abena 12/15/2014, 11:08 AM
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
## 2014-12-15 NOTE — Discharge Instructions (Signed)

## 2015-01-02 DIAGNOSIS — Z23 Encounter for immunization: Secondary | ICD-10-CM | POA: Diagnosis not present

## 2015-01-02 DIAGNOSIS — S2242XD Multiple fractures of ribs, left side, subsequent encounter for fracture with routine healing: Secondary | ICD-10-CM | POA: Diagnosis not present

## 2015-01-02 DIAGNOSIS — K567 Ileus, unspecified: Secondary | ICD-10-CM | POA: Diagnosis not present

## 2015-01-02 DIAGNOSIS — Z6838 Body mass index (BMI) 38.0-38.9, adult: Secondary | ICD-10-CM | POA: Diagnosis not present

## 2015-04-26 DIAGNOSIS — Z6839 Body mass index (BMI) 39.0-39.9, adult: Secondary | ICD-10-CM | POA: Diagnosis not present

## 2015-04-26 DIAGNOSIS — N400 Enlarged prostate without lower urinary tract symptoms: Secondary | ICD-10-CM | POA: Diagnosis not present

## 2015-04-26 DIAGNOSIS — R609 Edema, unspecified: Secondary | ICD-10-CM | POA: Diagnosis not present

## 2015-04-26 DIAGNOSIS — N529 Male erectile dysfunction, unspecified: Secondary | ICD-10-CM | POA: Diagnosis not present

## 2015-04-26 DIAGNOSIS — E782 Mixed hyperlipidemia: Secondary | ICD-10-CM | POA: Diagnosis not present

## 2015-05-19 DIAGNOSIS — R972 Elevated prostate specific antigen [PSA]: Secondary | ICD-10-CM | POA: Diagnosis not present

## 2015-05-19 DIAGNOSIS — N411 Chronic prostatitis: Secondary | ICD-10-CM | POA: Diagnosis not present

## 2015-05-19 DIAGNOSIS — N5201 Erectile dysfunction due to arterial insufficiency: Secondary | ICD-10-CM | POA: Diagnosis not present

## 2015-05-19 DIAGNOSIS — Z0000 Encounter for general adult medical examination without abnormal findings: Secondary | ICD-10-CM | POA: Diagnosis not present

## 2015-10-31 DIAGNOSIS — M1711 Unilateral primary osteoarthritis, right knee: Secondary | ICD-10-CM | POA: Diagnosis not present

## 2015-10-31 DIAGNOSIS — M25561 Pain in right knee: Secondary | ICD-10-CM | POA: Diagnosis not present

## 2015-11-07 DIAGNOSIS — R2689 Other abnormalities of gait and mobility: Secondary | ICD-10-CM | POA: Diagnosis not present

## 2015-11-07 DIAGNOSIS — M25561 Pain in right knee: Secondary | ICD-10-CM | POA: Diagnosis not present

## 2015-11-07 DIAGNOSIS — M1711 Unilateral primary osteoarthritis, right knee: Secondary | ICD-10-CM | POA: Diagnosis not present

## 2015-11-14 DIAGNOSIS — M25561 Pain in right knee: Secondary | ICD-10-CM | POA: Diagnosis not present

## 2015-11-14 DIAGNOSIS — M1711 Unilateral primary osteoarthritis, right knee: Secondary | ICD-10-CM | POA: Diagnosis not present

## 2015-11-21 DIAGNOSIS — M1711 Unilateral primary osteoarthritis, right knee: Secondary | ICD-10-CM | POA: Diagnosis not present

## 2015-11-21 DIAGNOSIS — M25561 Pain in right knee: Secondary | ICD-10-CM | POA: Diagnosis not present

## 2015-11-28 DIAGNOSIS — M1711 Unilateral primary osteoarthritis, right knee: Secondary | ICD-10-CM | POA: Diagnosis not present

## 2015-11-28 DIAGNOSIS — M25561 Pain in right knee: Secondary | ICD-10-CM | POA: Diagnosis not present

## 2015-12-25 DIAGNOSIS — Z96652 Presence of left artificial knee joint: Secondary | ICD-10-CM | POA: Diagnosis not present

## 2015-12-25 DIAGNOSIS — M25562 Pain in left knee: Secondary | ICD-10-CM | POA: Diagnosis not present

## 2015-12-26 DIAGNOSIS — M25562 Pain in left knee: Secondary | ICD-10-CM | POA: Diagnosis not present

## 2016-01-02 DIAGNOSIS — M25562 Pain in left knee: Secondary | ICD-10-CM | POA: Diagnosis not present

## 2016-03-06 DIAGNOSIS — Z23 Encounter for immunization: Secondary | ICD-10-CM | POA: Diagnosis not present

## 2016-03-06 DIAGNOSIS — M25519 Pain in unspecified shoulder: Secondary | ICD-10-CM | POA: Diagnosis not present

## 2016-09-30 DIAGNOSIS — N5201 Erectile dysfunction due to arterial insufficiency: Secondary | ICD-10-CM | POA: Diagnosis not present

## 2016-09-30 DIAGNOSIS — N411 Chronic prostatitis: Secondary | ICD-10-CM | POA: Diagnosis not present

## 2018-05-07 ENCOUNTER — Encounter (HOSPITAL_COMMUNITY): Payer: Self-pay | Admitting: Emergency Medicine

## 2018-05-07 ENCOUNTER — Other Ambulatory Visit: Payer: Self-pay

## 2018-05-07 ENCOUNTER — Emergency Department (HOSPITAL_COMMUNITY): Payer: Medicare Other

## 2018-05-07 ENCOUNTER — Inpatient Hospital Stay (HOSPITAL_COMMUNITY)
Admission: EM | Admit: 2018-05-07 | Discharge: 2018-05-09 | DRG: 682 | Disposition: A | Discharge status: Home / Self Care | Payer: Medicare Other | Attending: Family Medicine | Admitting: Family Medicine

## 2018-05-07 DIAGNOSIS — Z23 Encounter for immunization: Secondary | ICD-10-CM

## 2018-05-07 DIAGNOSIS — Z6837 Body mass index (BMI) 37.0-37.9, adult: Secondary | ICD-10-CM

## 2018-05-07 DIAGNOSIS — T39395A Adverse effect of other nonsteroidal anti-inflammatory drugs [NSAID], initial encounter: Secondary | ICD-10-CM | POA: Diagnosis present

## 2018-05-07 DIAGNOSIS — D62 Acute posthemorrhagic anemia: Secondary | ICD-10-CM | POA: Diagnosis present

## 2018-05-07 DIAGNOSIS — K529 Noninfective gastroenteritis and colitis, unspecified: Secondary | ICD-10-CM | POA: Diagnosis not present

## 2018-05-07 DIAGNOSIS — N390 Urinary tract infection, site not specified: Secondary | ICD-10-CM | POA: Diagnosis present

## 2018-05-07 DIAGNOSIS — E669 Obesity, unspecified: Secondary | ICD-10-CM | POA: Diagnosis present

## 2018-05-07 DIAGNOSIS — Z79899 Other long term (current) drug therapy: Secondary | ICD-10-CM

## 2018-05-07 DIAGNOSIS — Z96652 Presence of left artificial knee joint: Secondary | ICD-10-CM | POA: Diagnosis present

## 2018-05-07 DIAGNOSIS — K2211 Ulcer of esophagus with bleeding: Secondary | ICD-10-CM | POA: Diagnosis present

## 2018-05-07 DIAGNOSIS — N179 Acute kidney failure, unspecified: Secondary | ICD-10-CM | POA: Diagnosis present

## 2018-05-07 DIAGNOSIS — K210 Gastro-esophageal reflux disease with esophagitis: Secondary | ICD-10-CM | POA: Diagnosis present

## 2018-05-07 DIAGNOSIS — E785 Hyperlipidemia, unspecified: Secondary | ICD-10-CM | POA: Diagnosis present

## 2018-05-07 DIAGNOSIS — H9190 Unspecified hearing loss, unspecified ear: Secondary | ICD-10-CM | POA: Diagnosis present

## 2018-05-07 DIAGNOSIS — E860 Dehydration: Secondary | ICD-10-CM | POA: Diagnosis present

## 2018-05-07 DIAGNOSIS — X58XXXA Exposure to other specified factors, initial encounter: Secondary | ICD-10-CM | POA: Diagnosis present

## 2018-05-07 DIAGNOSIS — K922 Gastrointestinal hemorrhage, unspecified: Secondary | ICD-10-CM | POA: Diagnosis present

## 2018-05-07 DIAGNOSIS — Z791 Long term (current) use of non-steroidal anti-inflammatories (NSAID): Secondary | ICD-10-CM

## 2018-05-07 DIAGNOSIS — K2970 Gastritis, unspecified, without bleeding: Secondary | ICD-10-CM | POA: Diagnosis present

## 2018-05-07 DIAGNOSIS — K567 Ileus, unspecified: Secondary | ICD-10-CM | POA: Diagnosis present

## 2018-05-07 DIAGNOSIS — M1990 Unspecified osteoarthritis, unspecified site: Secondary | ICD-10-CM | POA: Diagnosis present

## 2018-05-07 DIAGNOSIS — K219 Gastro-esophageal reflux disease without esophagitis: Secondary | ICD-10-CM | POA: Diagnosis present

## 2018-05-07 DIAGNOSIS — K449 Diaphragmatic hernia without obstruction or gangrene: Secondary | ICD-10-CM | POA: Diagnosis present

## 2018-05-07 LAB — URINALYSIS, ROUTINE W REFLEX MICROSCOPIC
BILIRUBIN URINE: NEGATIVE
Glucose, UA: NEGATIVE mg/dL
HGB URINE DIPSTICK: NEGATIVE
KETONES UR: NEGATIVE mg/dL
Nitrite: POSITIVE — AB
PROTEIN: 30 mg/dL — AB
Specific Gravity, Urine: 1.026 (ref 1.005–1.030)
WBC, UA: >50 WBC/hpf — ABNORMAL HIGH (ref 0–5)
pH: 5 (ref 5.0–8.0)

## 2018-05-07 LAB — CBC
HCT: 43.3 % (ref 39.0–52.0)
Hemoglobin: 12.2 g/dL — ABNORMAL LOW (ref 13.0–17.0)
MCH: 23.3 pg — AB (ref 26.0–34.0)
MCHC: 28.2 g/dL — AB (ref 30.0–36.0)
MCV: 82.8 fL (ref 80.0–100.0)
Platelets: 279 10*3/uL (ref 150–400)
RBC: 5.23 MIL/uL (ref 4.22–5.81)
RDW: 18.4 % — AB (ref 11.5–15.5)
WBC: 10.2 10*3/uL (ref 4.0–10.5)
nRBC: 0 % (ref 0.0–0.2)

## 2018-05-07 LAB — COMPREHENSIVE METABOLIC PANEL
ALK PHOS: 65 U/L (ref 38–126)
ALT: 30 U/L (ref 0–44)
AST: 30 U/L (ref 15–41)
Albumin: 4.2 g/dL (ref 3.5–5.0)
Anion gap: 10 (ref 5–15)
BILIRUBIN TOTAL: 0.9 mg/dL (ref 0.3–1.2)
BUN: 31 mg/dL — AB (ref 8–23)
CALCIUM: 8.9 mg/dL (ref 8.9–10.3)
CO2: 26 mmol/L (ref 22–32)
CREATININE: 1.33 mg/dL — AB (ref 0.61–1.24)
Chloride: 103 mmol/L (ref 98–111)
GFR calc Af Amer: 57 mL/min — ABNORMAL LOW (ref >60)
GFR calc non Af Amer: 49 mL/min — ABNORMAL LOW (ref >60)
GLUCOSE: 144 mg/dL — AB (ref 70–99)
Potassium: 4.1 mmol/L (ref 3.5–5.1)
Sodium: 139 mmol/L (ref 135–145)
TOTAL PROTEIN: 8.2 g/dL — AB (ref 6.5–8.1)

## 2018-05-07 LAB — TYPE AND SCREEN
ABO/RH(D): O POS
Antibody Screen: NEGATIVE

## 2018-05-07 LAB — LIPASE, BLOOD: Lipase: 26 U/L (ref 11–51)

## 2018-05-07 LAB — OCCULT BLOOD X 1 CARD TO LAB, STOOL: Fecal Occult Bld: POSITIVE — AB

## 2018-05-07 IMAGING — DX DG ABDOMEN ACUTE W/ 1V CHEST
5 series · 5 of 5 positions shown · non-contrast
Comparison: [DATE], [DATE], [DATE]

CLINICAL DATA: Vomiting

EXAM:
DG ABDOMEN ACUTE W/ 1V CHEST

[chest pa]
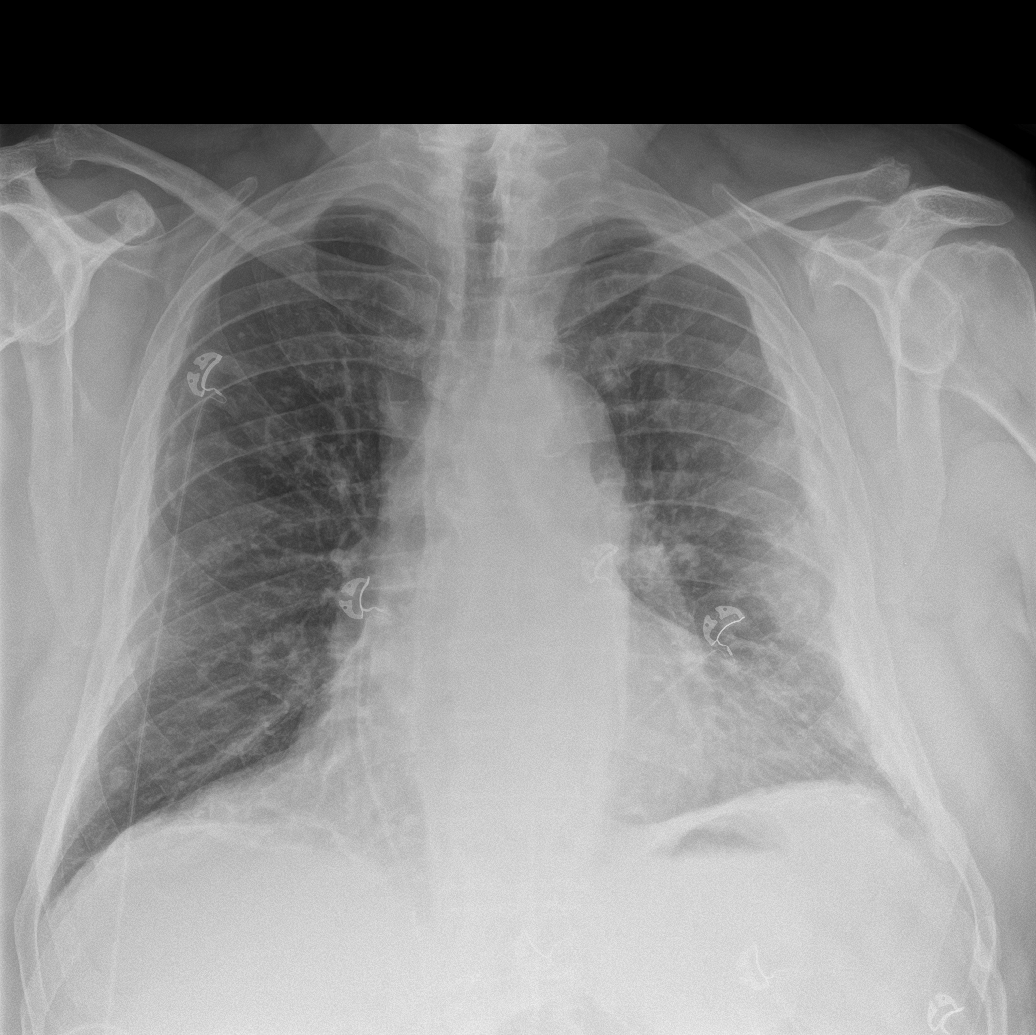

[abdomen erect (1 of 2)]
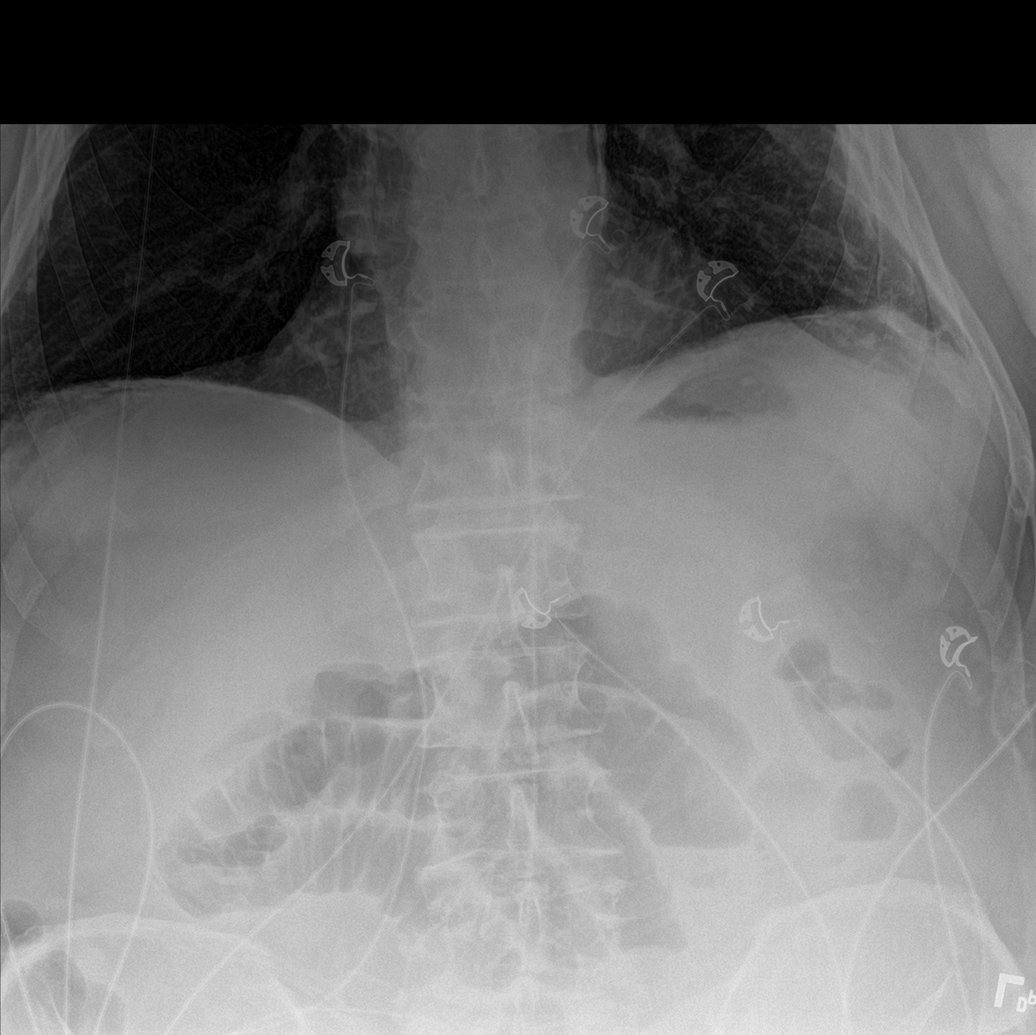

[abdomen supine (1 of 2)]
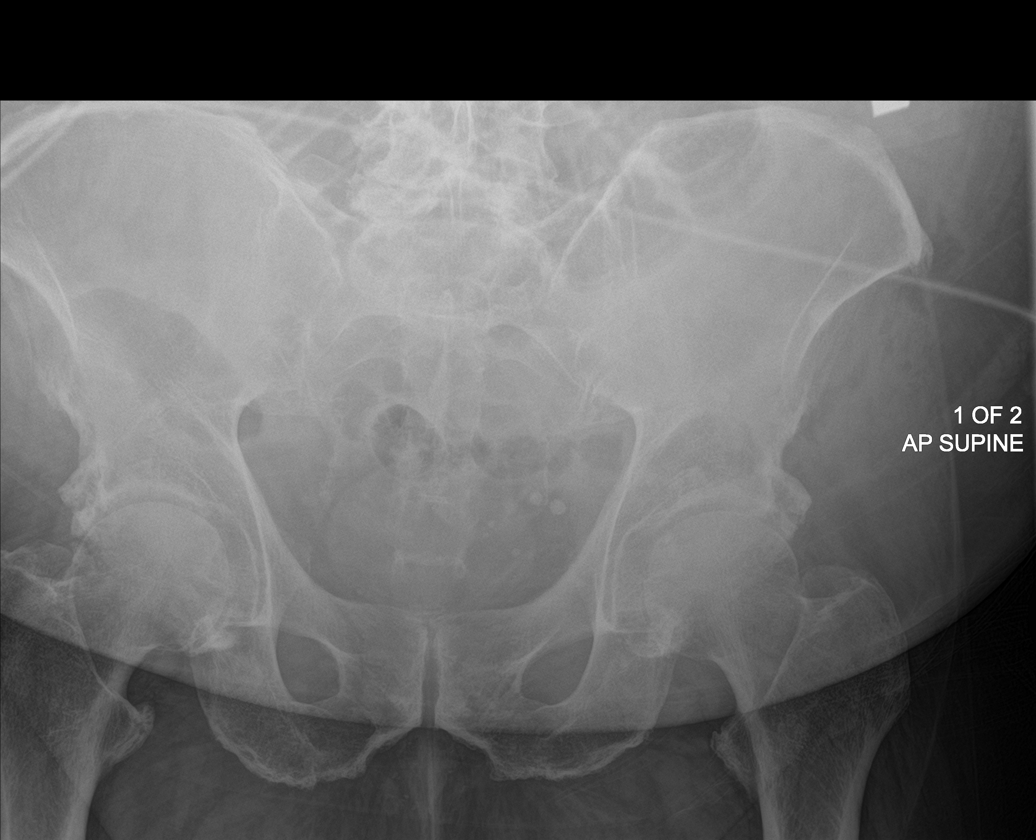

[abdomen erect (2 of 2)]
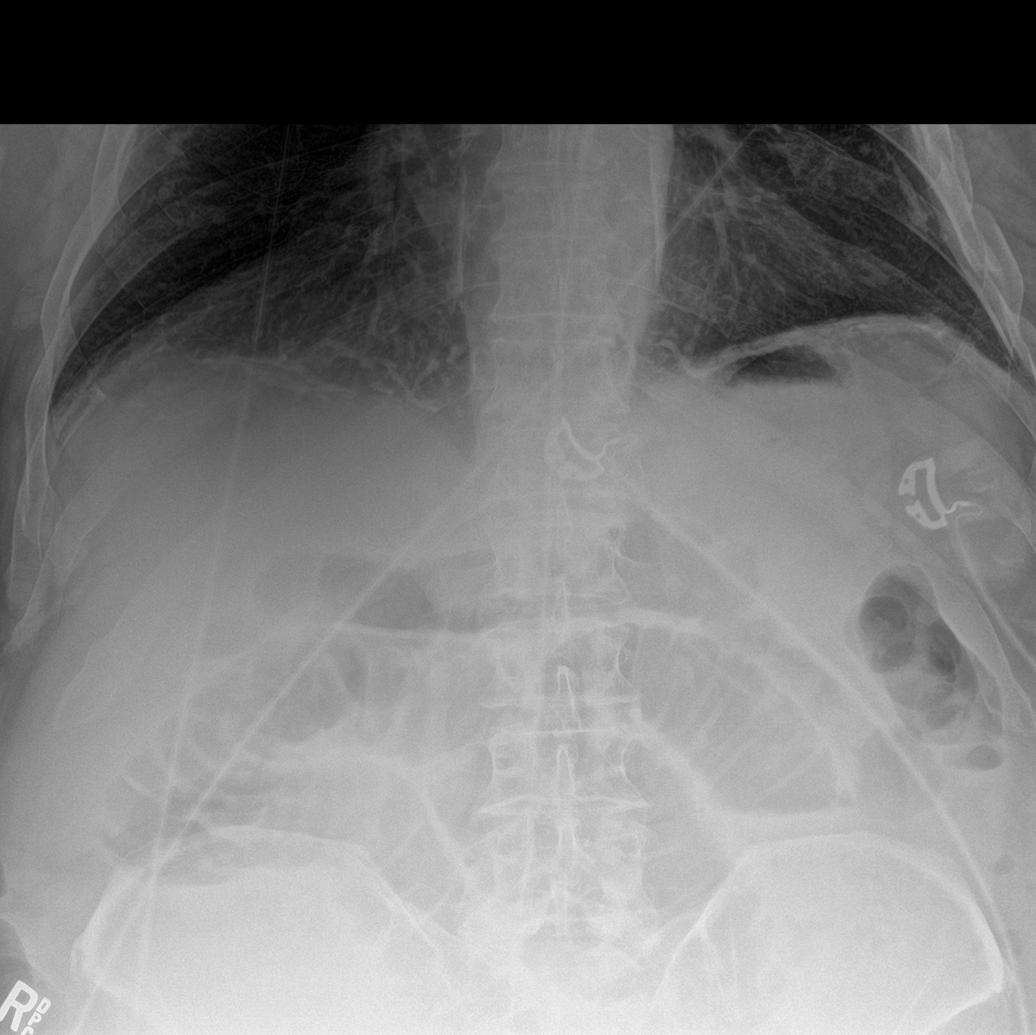

[abdomen supine (2 of 2)]
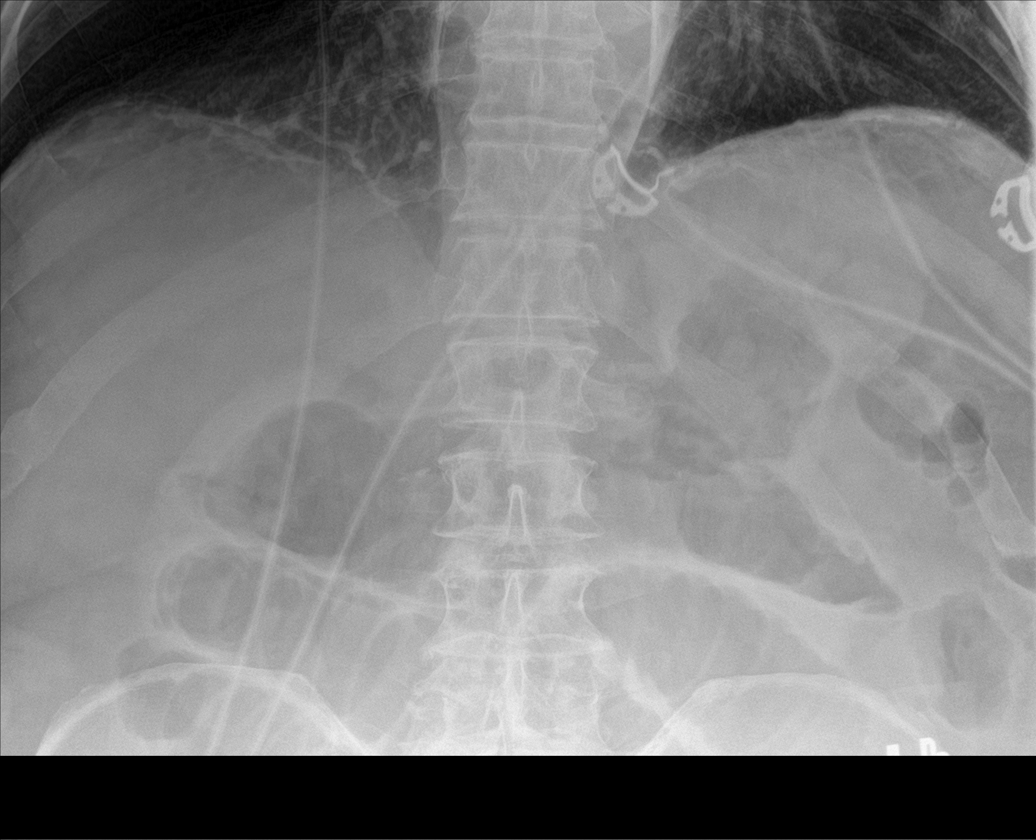

[5 of 5 positions shown; findings below may reference images not displayed]

FINDINGS: Single-view chest demonstrates left-sided pleural thickening and
bilateral calcified pleural plaques. Bilateral interstitial opacity.
No focal consolidation. Stable cardiomediastinal silhouette. 9 mm
possible lung nodule at the right CP angle.

Supine and upright views of the abdomen demonstrate no free air
beneath the diaphragm. Multiple loops of dilated small bowel within
the central abdomen measuring up to 4.8 cm with multiple fluid
levels. Paucity of distal bowel gas.
IMPRESSION: 1. Prominent interstitial opacity could be secondary to interstitial
inflammatory process. Bilateral calcified pleural plaques and left
pleural thickening, chronic. Possible 9 mm right CP angle lung
nodule. Chest CT follow-up may be obtained for further evaluation.
2. Findings consistent with mechanical small bowel obstruction. No
free air.

## 2018-05-07 MED ORDER — ONDANSETRON 4 MG PO TBDP
4.0000 mg | ORAL_TABLET | Freq: Once | ORAL | Status: AC | PRN
  Administered 2018-05-07: 4 mg via ORAL
  Filled 2018-05-07: qty 1

## 2018-05-07 MED ORDER — SODIUM CHLORIDE 0.9 % IV SOLN
INTRAVENOUS | Status: DC
  Administered 2018-05-07 – 2018-05-08 (×2): via INTRAVENOUS

## 2018-05-07 MED ORDER — SODIUM CHLORIDE 0.9 % IV SOLN
1.0000 g | INTRAVENOUS | Status: DC
  Administered 2018-05-08: 1 g via INTRAVENOUS
  Filled 2018-05-07: qty 10

## 2018-05-07 MED ORDER — ONDANSETRON HCL 4 MG PO TABS: 4.0000 mg | ORAL_TABLET | Freq: Four times a day (QID) | ORAL | Status: DC | PRN

## 2018-05-07 MED ORDER — SODIUM CHLORIDE 0.9 % IV BOLUS
500.0000 mL | Freq: Once | INTRAVENOUS | Status: AC
  Administered 2018-05-07: 500 mL via INTRAVENOUS

## 2018-05-07 MED ORDER — ACETAMINOPHEN 650 MG RE SUPP: 650.0000 mg | Freq: Four times a day (QID) | RECTAL | Status: DC | PRN

## 2018-05-07 MED ORDER — SODIUM CHLORIDE 0.9 % IV SOLN
1.0000 g | Freq: Once | INTRAVENOUS | Status: AC
  Administered 2018-05-07: 1 g via INTRAVENOUS
  Filled 2018-05-07: qty 10

## 2018-05-07 MED ORDER — SODIUM CHLORIDE 0.9 % IV SOLN
80.0000 mg | Freq: Once | INTRAVENOUS | Status: AC
  Administered 2018-05-07: 80 mg via INTRAVENOUS
  Filled 2018-05-07: qty 80

## 2018-05-07 MED ORDER — ACETAMINOPHEN 325 MG PO TABS: 650.0000 mg | ORAL_TABLET | Freq: Four times a day (QID) | ORAL | Status: DC | PRN

## 2018-05-07 MED ORDER — PANTOPRAZOLE SODIUM 40 MG IV SOLR
INTRAVENOUS | Status: AC
  Filled 2018-05-07: qty 80

## 2018-05-07 MED ORDER — SODIUM CHLORIDE 0.9 % IV SOLN
500.0000 mg | Freq: Once | INTRAVENOUS | Status: AC
  Administered 2018-05-07: 500 mg via INTRAVENOUS
  Filled 2018-05-07: qty 500

## 2018-05-07 MED ORDER — PNEUMOCOCCAL VAC POLYVALENT 25 MCG/0.5ML IJ INJ
0.5000 mL | INJECTION | INTRAMUSCULAR | Status: AC
  Administered 2018-05-09: 0.5 mL via INTRAMUSCULAR
  Filled 2018-05-07: qty 0.5

## 2018-05-07 MED ORDER — PANTOPRAZOLE SODIUM 40 MG IV SOLR
40.0000 mg | Freq: Two times a day (BID) | INTRAVENOUS | Status: DC
  Administered 2018-05-08 – 2018-05-09 (×3): 40 mg via INTRAVENOUS
  Filled 2018-05-07 (×3): qty 40

## 2018-05-07 MED ORDER — ONDANSETRON HCL 4 MG/2ML IJ SOLN: 4.0000 mg | Freq: Four times a day (QID) | INTRAMUSCULAR | Status: DC | PRN

## 2018-05-07 NOTE — ED Provider Notes (Signed)
[facility] EMERGENCY DEPARTMENT Provider Note   CSN: [id number] Arrival date & time: 05/07/18  1510    History   Chief Complaint Chief Complaint  Patient presents with  . Hematemesis    HPI Angel Norman is a 85 y.o. male.     HPI Patient states he has had 3 days of nausea, vomiting and diarrhea.  Had a son with similar symptoms prior to his onset.  Multiple episodes of watery stool and vomit.  This morning when he vomited he had dark blood in the vomit.  No clots noted.  He complains of upper abdominal burning sensation.  Denies chest pain.  Has been unable to tolerate solid intake.  Has been drinking Gatorade.  Has been on medication in the past for gastroesophageal reflux disease and occasionally takes this.  Occasionally takes naproxen as needed for pain. Past Medical History:  Diagnosis Date  . Arthritis   . GERD (gastroesophageal reflux disease)   . Hyperlipidemia   . Rib fractures 12/06/2014    Patient Active Problem List   Diagnosis Date Noted  . AKI (acute kidney injury) (HCC) 05/07/2018  . Blunt chest trauma 12/07/2014  . Ileus (HCC) 12/07/2014  . Fracture of multiple ribs of left side 12/06/2014    Past Surgical History:  Procedure Laterality Date  . goiter removal    . TOTAL KNEE ARTHROPLASTY Left 08/18/2012   Dr Lajoyce Corners  . TOTAL KNEE ARTHROPLASTY Left 08/19/2012   Procedure: TOTAL KNEE ARTHROPLASTY;  Surgeon: Nadara Mustard, MD;  Location: [facility] OR;  Service: Orthopedics;  Laterality: Left;  Left Total Knee Arthroplasty        Home Medications    Prior to Admission medications   Not on File    Family History No family history on file.  Social History Social History   Tobacco Use  . Smoking status: Never Smoker  . Smokeless tobacco: Never Used  Substance Use Topics  . Alcohol use: No  . Drug use: No     Allergies   Patient has no known allergies.   Review of Systems Review of Systems  Constitutional: Positive for appetite change. Negative  for chills and fever.  HENT: Negative for sore throat and trouble swallowing.   Eyes: Negative for visual disturbance.  Respiratory: Negative for cough and shortness of breath.   Cardiovascular: Negative for chest pain.  Gastrointestinal: Positive for abdominal pain, diarrhea, nausea and vomiting.  Genitourinary: Negative for dysuria, flank pain, frequency and hematuria.  Musculoskeletal: Negative for back pain, myalgias and neck pain.  Skin: Negative for rash and wound.  Neurological: Negative for dizziness, weakness, light-headedness, numbness and headaches.  All other systems reviewed and are negative.    Physical Exam Updated Vital Signs BP 120/65   Pulse 82   Temp 98 F (36.7 C) (Oral)   Resp 18   Ht 6\' 3"  (1.905 m)   Wt 136.1 kg   SpO2 96%   BMI 37.50 kg/m   Physical Exam Vitals signs and nursing note reviewed.  Constitutional:      General: He is not in acute distress.    Appearance: Normal appearance. He is well-developed. He is not ill-appearing.  HENT:     Head: Normocephalic and atraumatic.     Nose: Nose normal.     Mouth/Throat:     Mouth: Mucous membranes are moist.     Pharynx: No oropharyngeal exudate or posterior oropharyngeal erythema.  Eyes:     Extraocular Movements: Extraocular movements intact.  Pupils: Pupils are equal, round, and reactive to light.  Neck:     Musculoskeletal: Normal range of motion and neck supple. No neck rigidity or muscular tenderness.  Cardiovascular:     Rate and Rhythm: Normal rate and regular rhythm.     Heart sounds: No murmur. No friction rub. No gallop.   Pulmonary:     Effort: Pulmonary effort is normal. No respiratory distress.     Breath sounds: Normal breath sounds. No stridor. No wheezing, rhonchi or rales.  Chest:     Chest wall: No tenderness.  Abdominal:     General: Bowel sounds are normal. There is no distension.     Palpations: Abdomen is soft. There is no mass.     Tenderness: There is abdominal  tenderness. There is no right CVA tenderness, left CVA tenderness, guarding or rebound.     Hernia: No hernia is present.     Comments: Mild epigastric tenderness to palpation.  Musculoskeletal: Normal range of motion.        General: No swelling, tenderness, deformity or signs of injury.     Right lower leg: No edema.     Left lower leg: No edema.     Comments: No midline thoracic or lumbar tenderness.  No CVA tenderness.  Lymphadenopathy:     Cervical: No cervical adenopathy.  Skin:    General: Skin is warm and dry.     Capillary Refill: Capillary refill takes less than 2 seconds.     Findings: No erythema or rash.  Neurological:     General: No focal deficit present.     Mental Status: He is alert and oriented to person, place, and time.  Psychiatric:        Mood and Affect: Mood normal.        Behavior: Behavior normal.      ED Treatments / Results  Labs (all labs ordered are listed, but only abnormal results are displayed) Labs Reviewed  COMPREHENSIVE METABOLIC PANEL - Abnormal; Notable for the following components:      Result Value   Glucose, Bld 144 (*)    BUN 31 (*)    Creatinine, Ser 1.33 (*)    Total Protein 8.2 (*)    GFR calc non Af Amer 49 (*)    GFR calc Af Amer 57 (*)    All other components within normal limits  CBC - Abnormal; Notable for the following components:   Hemoglobin 12.2 (*)    MCH 23.3 (*)    MCHC 28.2 (*)    RDW 18.4 (*)    All other components within normal limits  URINALYSIS, ROUTINE W REFLEX MICROSCOPIC - Abnormal; Notable for the following components:   Color, Urine AMBER (*)    APPearance CLOUDY (*)    Protein, ur 30 (*)    Nitrite POSITIVE (*)    Leukocytes,Ua SMALL (*)    WBC, UA >50 (*)    Bacteria, UA MANY (*)    All other components within normal limits  OCCULT BLOOD X 1 CARD TO LAB, STOOL - Abnormal; Notable for the following components:   Fecal Occult Bld POSITIVE (*)    All other components within normal limits  URINE  CULTURE  LIPASE, BLOOD  CBC  COMPREHENSIVE METABOLIC PANEL  TYPE AND SCREEN    EKG EKG Interpretation  Date/Time:  Thursday May 07 2018 17:47:38 EST Ventricular Rate:  82 PR Interval:    QRS Duration: 123 QT Interval:  404 QTC Calculation:  472 R Axis:   -29 Text Interpretation:  Sinus rhythm Atrial premature complex Nonspecific intraventricular conduction delay Confirmed by Loren Racer ([id number]) on 05/07/2018 10:47:21 PM   Radiology Dg Abd Acute 2+v W 1v Chest  Result Date: 05/07/2018 CLINICAL DATA:  Vomiting EXAM: DG ABDOMEN ACUTE W/ 1V CHEST COMPARISON:  12/12/2014, 12/08/2014, 12/06/2014 FINDINGS: Single-view chest demonstrates left-sided pleural thickening and bilateral calcified pleural plaques. Bilateral interstitial opacity. No focal consolidation. Stable cardiomediastinal silhouette. 9 mm possible lung nodule at the right CP angle. Supine and upright views of the abdomen demonstrate no free air beneath the diaphragm. Multiple loops of dilated small bowel within the central abdomen measuring up to 4.8 cm with multiple fluid levels. Paucity of distal bowel gas. IMPRESSION: 1. Prominent interstitial opacity could be secondary to interstitial inflammatory process. Bilateral calcified pleural plaques and left pleural thickening, chronic. Possible 9 mm right CP angle lung nodule. Chest CT follow-up may be obtained for further evaluation. 2. Findings consistent with mechanical small bowel obstruction. No free air. Electronically Signed   By: Jasmine Pang M.D.   On: 05/07/2018 18:47    Procedures Procedures (including critical care time)  Medications Ordered in ED Medications  acetaminophen (TYLENOL) tablet 650 mg (has no administration in time range)    Or  acetaminophen (TYLENOL) suppository 650 mg (has no administration in time range)  ondansetron (ZOFRAN) tablet 4 mg (has no administration in time range)    Or  ondansetron (ZOFRAN) injection 4 mg (has no administration in  time range)  0.9 %  sodium chloride infusion (has no administration in time range)  cefTRIAXone (ROCEPHIN) 1 g in sodium chloride 0.9 % 100 mL IVPB (has no administration in time range)  pantoprazole (PROTONIX) injection 40 mg (has no administration in time range)  ondansetron (ZOFRAN-ODT) disintegrating tablet 4 mg (4 mg Oral Given 05/07/18 1524)  pantoprazole (PROTONIX) 80 mg in sodium chloride 0.9 % 100 mL IVPB ( Intravenous Stopped 05/07/18 1920)  sodium chloride 0.9 % bolus 500 mL (0 mLs Intravenous Stopped 05/07/18 1859)  cefTRIAXone (ROCEPHIN) 1 g in sodium chloride 0.9 % 100 mL IVPB ( Intravenous Stopped 05/07/18 1955)  sodium chloride 0.9 % bolus 500 mL (0 mLs Intravenous Stopped 05/07/18 1957)  azithromycin (ZITHROMAX) 500 mg in sodium chloride 0.9 % 250 mL IVPB ( Intravenous Stopped 05/07/18 2105)     Initial Impression / Assessment and Plan / ED Course  I have reviewed the triage vital signs and the nursing notes.  Pertinent labs & imaging results that were available during my care of the patient were reviewed by me and considered in my medical decision making (see chart for details).       Patient with Hemoccult positive stool.  Started on PPI.  X-rays with questionable small bowel obstruction.  Clinically patient is passing stool and gas.  Abdominal exam is benign.  Most consistent with gastroenteritis.  Patient also appears to have UTI on UA and possible infiltrates on chest x-ray.  Patient does admit to recent cough.  Started on Rocephin and azithromycin in the emergency department.  Has been given IV fluids for likely dehydration.  Discussed with hospitalist will see patient emergency department and admit.   Final Clinical Impressions(s) / ED Diagnoses   Final diagnoses:  Gastroenteritis, acute  Dehydration  Upper gastrointestinal bleed  Acute lower UTI    ED Discharge Orders    None       Loren Racer, MD 05/07/18 2249

## 2018-05-07 NOTE — ED Notes (Signed)
AC to bring Protonix drip.  

## 2018-05-07 NOTE — ED Notes (Addendum)
ED TO INPATIENT HANDOFF REPORT  ED Nurse Name and Phone #: Maralyn Sago [phone]  S Name/Age/Gender Angel Norman 85 y.o. male Room/Bed: APA08/APA08  Code Status   Code Status: Prior  Home/SNF/Other Home Patient oriented to: self, place, time and situation Is this baseline? Yes   Triage Complete: Triage complete  Chief Complaint emesis black  Triage Note Pt reports n/v/d x 3 days. Family member at home with similar symptoms. Pt reports that today he began having black "blood" in his vomit. Pt not currently on anticoagulation medication. Pt reports abdominal soreness from frequent vomiting.   Allergies No Known Allergies  Level of Care/Admitting Diagnosis ED Disposition    ED Disposition Condition Comment   Admit  Hospital Area: [facility] [[id number]]  Level of Care: Med-Surg [16]  Diagnosis: AKI (acute kidney injury) [facility]) [[id number]]  Admitting Physician: Meredeth Ide [[id number]]  Attending Physician: Meredeth Ide [[id number]]  PT Class (Do Not Modify): Observation [104]  PT Acc Code (Do Not Modify): Observation [10022]       B Medical/Surgery History Past Medical History:  Diagnosis Date  . Arthritis   . GERD (gastroesophageal reflux disease)   . Hyperlipidemia   . Rib fractures 12/06/2014   Past Surgical History:  Procedure Laterality Date  . goiter removal    . TOTAL KNEE ARTHROPLASTY Left 08/18/2012   Dr Lajoyce Corners  . TOTAL KNEE ARTHROPLASTY Left 08/19/2012   Procedure: TOTAL KNEE ARTHROPLASTY;  Surgeon: Nadara Mustard, MD;  Location: [facility] OR;  Service: Orthopedics;  Laterality: Left;  Left Total Knee Arthroplasty     A IV Location/Drains/Wounds Patient Lines/Drains/Airways Status   Active Line/Drains/Airways    Name:   Placement date:   Placement time:   Site:   Days:   Peripheral IV 05/07/18 Right Antecubital   05/07/18    1740    Antecubital   less than 1          Intake/Output Last 24 hours  Intake/Output Summary (Last 24 hours) at 05/07/2018 2044 Last data  filed at 05/07/2018 2036 Gross per 24 hour  Intake 1326.45 ml  Output -  Net 1326.45 ml    Labs/Imaging Results for orders placed or performed during the hospital encounter of 05/07/18 (from the past 48 hour(s))  Lipase, blood     Status: None   Collection Time: 05/07/18  3:32 PM  Result Value Ref Range   Lipase 26 11 - 51 U/L    Comment: Performed at [facility], [address] [place], [place] [address]  Comprehensive metabolic panel     Status: Abnormal   Collection Time: 05/07/18  3:32 PM  Result Value Ref Range   Sodium 139 135 - 145 mmol/L   Potassium 4.1 3.5 - 5.1 mmol/L   Chloride 103 98 - 111 mmol/L   CO2 26 22 - 32 mmol/L   Glucose, Bld 144 (H) 70 - 99 mg/dL   BUN 31 (H) 8 - 23 mg/dL   Creatinine, Ser 9.45 (H) 0.61 - 1.24 mg/dL   Calcium 8.9 8.9 - 85.9 mg/dL   Total Protein 8.2 (H) 6.5 - 8.1 g/dL   Albumin 4.2 3.5 - 5.0 g/dL   AST 30 15 - 41 U/L   ALT 30 0 - 44 U/L   Alkaline Phosphatase 65 38 - 126 U/L   Total Bilirubin 0.9 0.3 - 1.2 mg/dL   GFR calc non Af Amer 49 (L) >60 mL/min   GFR calc Af Amer 57 (L) >60 mL/min  Anion gap 10 5 - 15    Comment: Performed at [facility], [address], [place], [place] [address]  CBC     Status: Abnormal   Collection Time: 05/07/18  3:32 PM  Result Value Ref Range   WBC 10.2 4.0 - 10.5 K/uL   RBC 5.23 4.22 - 5.81 MIL/uL   Hemoglobin 12.2 (L) 13.0 - 17.0 g/dL   HCT 18.2 99.3 - 71.6 %   MCV 82.8 80.0 - 100.0 fL   MCH 23.3 (L) 26.0 - 34.0 pg   MCHC 28.2 (L) 30.0 - 36.0 g/dL   RDW 96.7 (H) 89.3 - 81.0 %   Platelets 279 150 - 400 K/uL   nRBC 0.0 0.0 - 0.2 %    Comment: Performed at [facility], [address], [place], [place] [address]  Urinalysis, Routine w reflex microscopic     Status: Abnormal   Collection Time: 05/07/18  5:05 PM  Result Value Ref Range   Color, Urine AMBER (A) YELLOW    Comment: BIOCHEMICALS MAY BE AFFECTED BY COLOR   APPearance CLOUDY (A) CLEAR   Specific Gravity, Urine 1.026 1.005 - 1.030   pH  5.0 5.0 - 8.0   Glucose, UA NEGATIVE NEGATIVE mg/dL   Hgb urine dipstick NEGATIVE NEGATIVE   Bilirubin Urine NEGATIVE NEGATIVE   Ketones, ur NEGATIVE NEGATIVE mg/dL   Protein, ur 30 (A) NEGATIVE mg/dL   Nitrite POSITIVE (A) NEGATIVE   Leukocytes,Ua SMALL (A) NEGATIVE   RBC / HPF 6-10 0 - 5 RBC/hpf   WBC, UA >50 (H) 0 - 5 WBC/hpf   Bacteria, UA MANY (A) NONE SEEN   Squamous Epithelial / LPF 0-5 0 - 5   Mucus PRESENT    Amorphous Crystal PRESENT     Comment: Performed at [facility], [address] [place], [place] [address]  Type and screen     Status: None   Collection Time: 05/07/18  5:38 PM  Result Value Ref Range   ABO/RH(D) O POS    Antibody Screen NEG    Sample Expiration      05/10/2018 Performed at [facility], [address] [place], [place] [address]   Occult blood card to lab, stool     Status: Abnormal   Collection Time: 05/07/18  7:00 PM  Result Value Ref Range   Fecal Occult Bld POSITIVE (A) NEGATIVE    Comment: Performed at [facility], [address] [place], [place] [address]   Dg Abd Acute 2+v W 1v Chest  Result Date: 05/07/2018 CLINICAL DATA:  Vomiting EXAM: DG ABDOMEN ACUTE W/ 1V CHEST COMPARISON:  12/12/2014, 12/08/2014, 12/06/2014 FINDINGS: Single-view chest demonstrates left-sided pleural thickening and bilateral calcified pleural plaques. Bilateral interstitial opacity. No focal consolidation. Stable cardiomediastinal silhouette. 9 mm possible lung nodule at the right CP angle. Supine and upright views of the abdomen demonstrate no free air beneath the diaphragm. Multiple loops of dilated small bowel within the central abdomen measuring up to 4.8 cm with multiple fluid levels. Paucity of distal bowel gas. IMPRESSION: 1. Prominent interstitial opacity could be secondary to interstitial inflammatory process. Bilateral calcified pleural plaques and left pleural thickening, chronic. Possible 9 mm right CP angle lung nodule. Chest CT follow-up may be obtained for  further evaluation. 2. Findings consistent with mechanical small bowel obstruction. No free air. Electronically Signed   By: Jasmine Pang M.D.   On: 05/07/2018 18:47    Pending Labs [employer] (From admission, onward)    Start  Ordered   05/07/18 1830  Urine culture  ONCE - STAT,   STAT     05/07/18 1830   Signed and Held  CBC  Tomorrow morning,   R     Signed and Held   Signed and Held  Comprehensive metabolic panel  Tomorrow morning,   R     Signed and Held          Vitals/Pain Today's Vitals   05/07/18 1520 05/07/18 1800 05/07/18 1900 05/07/18 2000  BP:  117/67 139/67 114/63  Pulse:  86 83 84  Resp:  Temp:      TempSrc:      SpO2:  96% 93% 93%  Weight: 136.1 kg     Height:  (1.905 m)     PainSc:        Isolation Precautions No active isolations  Medications Medications  azithromycin (ZITHROMAX) 500 mg in sodium chloride 0.9 % 250 mL IVPB ( Intravenous Rate/Dose Verify 05/07/18 2036)  ondansetron (ZOFRAN-ODT) disintegrating tablet 4 mg (4 mg Oral Given 05/07/18 1524)  pantoprazole (PROTONIX) 80 mg in sodium chloride 0.9 % 100 mL IVPB ( Intravenous Stopped 05/07/18 1920)  sodium chloride 0.9 % bolus 500 mL (0 mLs Intravenous Stopped 05/07/18 1859)  cefTRIAXone (ROCEPHIN) 1 g in sodium chloride 0.9 % 100 mL IVPB ( Intravenous Stopped 05/07/18 1955)  sodium chloride 0.9 % bolus 500 mL (0 mLs Intravenous Stopped 05/07/18 1957)    Mobility walks Low fall risk   Focused Assessments  R Recommendations: See Admitting Provider Note  Report given to: [facility]  Additional Notes:

## 2018-05-07 NOTE — H&P (Signed)
TRH H&P    Patient Demographics:    Angel Norman, is a 85 y.o. male  MRN: [id number]  DOB - 07/21/1932  Admit Date - 05/07/2018  Referring MD/NP/PA: Ranae Palms  Outpatient Primary MD for the patient is Arabella Merles, CNM  Patient coming from: Home  Chief complaint-diarrhea   HPI:    Angel Norman  is a 85 y.o. male, with history of GERD, hyperlipidemia came to hospital with 3-day history of nausea vomiting and diarrhea.  Patient says that this morning he vomited dark blood in the vomit.  Also complains of abdominal pain.  Denies chest pain or shortness of breath.  Patient has been drinking Gatorade.  Also takes naproxen as needed for pain. In the ED FOBT was positive, hemoglobin stable at 12.2. Renal function revealed acute kidney injury with creatinine 1.33, baseline creatinine 0.95. Also had abnormal UA, abdomen x-ray showed concern for small bowel obstruction. Patient started on empiric Rocephin for UTI He denies dysuria, urgency or frequency of urination. Denies dizziness or passing out.  No history of stroke or seizures.    Review of systems:    In addition to the HPI above,    All other systems reviewed and are negative.    Past History of the following :    Past Medical History:  Diagnosis Date  . Arthritis   . GERD (gastroesophageal reflux disease)   . Hyperlipidemia   . Rib fractures 12/06/2014      Past Surgical History:  Procedure Laterality Date  . goiter removal    . TOTAL KNEE ARTHROPLASTY Left 08/18/2012   Dr Lajoyce Corners  . TOTAL KNEE ARTHROPLASTY Left 08/19/2012   Procedure: TOTAL KNEE ARTHROPLASTY;  Surgeon: Nadara Mustard, MD;  Location: [facility] OR;  Service: Orthopedics;  Laterality: Left;  Left Total Knee Arthroplasty      Social History:      Social History   Tobacco Use  . Smoking status: Never Smoker  . Smokeless tobacco: Never Used  Substance Use Topics  . Alcohol use:  No       Family History :   No family history of cancer.   Home Medications:   Prior to Admission medications   Medication Sig Start Date End Date Taking? Authorizing Provider  docusate sodium (COLACE) 100 MG capsule Take 1 capsule (100 mg total) by mouth 2 (two) times daily. 12/15/14   Riebock, Anette Riedel, NP  famotidine (PEPCID) 20 MG tablet Take 20 mg by mouth 3 times/day as needed-between meals & bedtime for heartburn or indigestion.     [provider]  methocarbamol (ROBAXIN) 500 MG tablet Take 1 tablet (500 mg total) by mouth 2 (two) times daily as needed. Patient taking differently: Take 500-1,000 mg by mouth 2 (two) times daily as needed for muscle spasms.  08/22/12   Nadara Mustard, MD  naproxen (EC NAPROSYN) 500 MG EC tablet Take 500 mg by mouth 2 (two) times daily as needed (pain).     [provider]  polyethylene glycol (MIRALAX / GLYCOLAX) packet Take 17 g  by mouth daily. 12/15/14   Riebock, Anette Riedel, NP  pravastatin (PRAVACHOL) 80 MG tablet Take 80 mg by mouth daily.    [provider]  Tamsulosin HCl (FLOMAX) 0.4 MG CAPS Take 0.4 mg by mouth daily.    [provider]     Allergies:    No Known Allergies   Physical Exam:   Vitals  Blood pressure 114/63, pulse 84, temperature 97.6 F (36.4 C), temperature source Oral, resp. rate 15, height 6\' 3"  (1.905 m), weight 136.1 kg, SpO2 93 %.  1.  General: Appears in no acute distress  2. Psychiatric: Alert, oriented x3  3. Neurologic: Cranial nerve II through grossly intact, motor strength 5/5 in all extremities  4. HEENMT:  Atraumatic normocephalic, extraocular muscle intact, oral mucosa mildly dry  5. Respiratory : Clear to auscultation bilaterally, no wheezing or crackles  6. Cardiovascular : S1-S2, regular, no murmur auscultated  7. Gastrointestinal:  Abdomen is soft, nontender, no organomegaly      Data Review:    CBC Recent Labs  Lab 05/07/18 1532  WBC 10.2  HGB  12.2*  HCT 43.3  PLT 279  MCV 82.8  MCH 23.3*  MCHC 28.2*  RDW 18.4*   ------------------------------------------------------------------------------------------------------------------  Results for orders placed or performed during the hospital encounter of 05/07/18 (from the past 48 hour(s))  Lipase, blood     Status: None   Collection Time: 05/07/18  3:32 PM  Result Value Ref Range   Lipase 26 11 - 51 U/L    Comment: Performed at [facility], [address] [place], [place] [address]  Comprehensive metabolic panel     Status: Abnormal   Collection Time: 05/07/18  3:32 PM  Result Value Ref Range   Sodium 139 135 - 145 mmol/L   Potassium 4.1 3.5 - 5.1 mmol/L   Chloride 103 98 - 111 mmol/L   CO2 26 22 - 32 mmol/L   Glucose, Bld 144 (H) 70 - 99 mg/dL   BUN 31 (H) 8 - 23 mg/dL   Creatinine, Ser 0.25 (H) 0.61 - 1.24 mg/dL   Calcium 8.9 8.9 - 85.2 mg/dL   Total Protein 8.2 (H) 6.5 - 8.1 g/dL   Albumin 4.2 3.5 - 5.0 g/dL   AST 30 15 - 41 U/L   ALT 30 0 - 44 U/L   Alkaline Phosphatase 65 38 - 126 U/L   Total Bilirubin 0.9 0.3 - 1.2 mg/dL   GFR calc non Af Amer 49 (L) >60 mL/min   GFR calc Af Amer 57 (L) >60 mL/min   Anion gap 10 5 - 15    Comment: Performed at [facility], [address] [place], [place] [address]  CBC     Status: Abnormal   Collection Time: 05/07/18  3:32 PM  Result Value Ref Range   WBC 10.2 4.0 - 10.5 K/uL   RBC 5.23 4.22 - 5.81 MIL/uL   Hemoglobin 12.2 (L) 13.0 - 17.0 g/dL   HCT 23.5 36.1 - 44.3 %   MCV 82.8 80.0 - 100.0 fL   MCH 23.3 (L) 26.0 - 34.0 pg   MCHC 28.2 (L) 30.0 - 36.0 g/dL   RDW 15.4 (H) 00.8 - 67.6 %   Platelets 279 150 - 400 K/uL   nRBC 0.0 0.0 - 0.2 %    Comment: Performed at [facility], [address] [place], [place] [address]  Urinalysis, Routine w reflex microscopic     Status: Abnormal   Collection Time: 05/07/18  5:05 PM  Result Value Ref Range   Color, Urine AMBER (A) YELLOW    Comment: BIOCHEMICALS MAY BE AFFECTED BY  COLOR   APPearance CLOUDY (A) CLEAR   Specific Gravity, Urine 1.026 1.005 - 1.030   pH 5.0 5.0 - 8.0   Glucose, UA NEGATIVE NEGATIVE mg/dL   Hgb urine dipstick NEGATIVE NEGATIVE   Bilirubin Urine NEGATIVE NEGATIVE   Ketones, ur NEGATIVE NEGATIVE mg/dL   Protein, ur 30 (A) NEGATIVE mg/dL   Nitrite POSITIVE (A) NEGATIVE   Leukocytes,Ua SMALL (A) NEGATIVE   RBC / HPF 6-10 0 - 5 RBC/hpf   WBC, UA >50 (H) 0 - 5 WBC/hpf   Bacteria, UA MANY (A) NONE SEEN   Squamous Epithelial / LPF 0-5 0 - 5   Mucus PRESENT    Amorphous Crystal PRESENT     Comment: Performed at [facility], [address], [place], [place] [address]  Type and screen     Status: None   Collection Time: 05/07/18  5:38 PM  Result Value Ref Range   ABO/RH(D) O POS    Antibody Screen NEG    Sample Expiration      05/10/2018 Performed at [facility], [address], [place], [place] [address]   Occult blood card to lab, stool     Status: Abnormal   Collection Time: 05/07/18  7:00 PM  Result Value Ref Range   Fecal Occult Bld POSITIVE (A) NEGATIVE    Comment: Performed at [facility] [address] [place], [place] [address]    Chemistries  Recent Labs  Lab 05/07/18 1532  NA 139  K 4.1  CL 103  CO2 26  GLUCOSE 144*  BUN 31*  CREATININE 1.33*  CALCIUM 8.9  AST 30  ALT 30  ALKPHOS 65  BILITOT 0.9   ------------------------------------------------------------------------------------------------------------------  ------------------------------------------------------------------------------------------------------------------ GFR: Estimated Creatinine Clearance: 62.6 mL/min (A) (by C-G formula based on SCr of 1.33 mg/dL (H)). Liver Function Tests: Recent Labs  Lab 05/07/18 1532  AST 30  ALT 30  ALKPHOS 65  BILITOT 0.9  PROT 8.2*  ALBUMIN 4.2   Recent Labs  Lab 05/07/18 1532  LIPASE 26     --------------------------------------------------------------------------------------------------------------- Urine analysis:    Component Value Date/Time   COLORURINE AMBER (A) 05/07/2018 1705   APPEARANCEUR CLOUDY (A) 05/07/2018 1705   LABSPEC 1.026 05/07/2018 1705   PHURINE 5.0 05/07/2018 1705   GLUCOSEU NEGATIVE 05/07/2018 1705   HGBUR NEGATIVE 05/07/2018 1705   BILIRUBINUR NEGATIVE 05/07/2018 1705   KETONESUR NEGATIVE 05/07/2018 1705   PROTEINUR 30 (A) 05/07/2018 1705   NITRITE POSITIVE (A) 05/07/2018 1705   LEUKOCYTESUR SMALL (A) 05/07/2018 1705      Imaging Results:    Dg Abd Acute 2+v W 1v Chest  Result Date: 05/07/2018 CLINICAL DATA:  Vomiting EXAM: DG ABDOMEN ACUTE W/ 1V CHEST COMPARISON:  12/12/2014, 12/08/2014, 12/06/2014 FINDINGS: Single-view chest demonstrates left-sided pleural thickening and bilateral calcified pleural plaques. Bilateral interstitial opacity. No focal consolidation. Stable cardiomediastinal silhouette. 9 mm possible lung nodule at the right CP angle. Supine and upright views of the abdomen demonstrate no free air beneath the diaphragm. Multiple loops of dilated small bowel within the central abdomen measuring up to 4.8 cm with multiple fluid levels. Paucity of distal bowel gas. IMPRESSION: 1. Prominent interstitial opacity could be secondary to interstitial inflammatory process. Bilateral calcified pleural plaques and left pleural thickening, chronic. Possible 9 mm right CP angle lung nodule. Chest CT follow-up may be obtained for further evaluation. 2.  Findings consistent with mechanical small bowel obstruction. No free air. Electronically Signed   By: Jasmine Pang M.D.   On: 05/07/2018 18:47    My personal review of EKG: Rhythm NSR   Assessment & Plan:    Active Problems:   AKI (acute kidney injury) (HCC)   1. Acute kidney injury-secondary to dehydration from vomiting and diarrhea.  Will start normal saline at 100 mL/h.  Vital signs are  stable.  Follow BMP in a.m.  2. Nausea/vomiting/diarrhea-WBC is normal, likely viral.  Vital signs are stable.  Started on IV fluids as above.  Will monitor.  3. Positive FOBT-patient has positive FOBT, hemoglobin surprisingly stable at 12.2.  Patient complained of vomiting blood and epigastric discomfort, will start on Protonix 40 mg IV twice daily.  Will obtain GI consultation in a.m.  4. ?  UTI versus asymptomatic bacteriuria-patient is completely asymptomatic but has abnormal UA, started on empirically Rocephin.  Urine culture has been obtained.  Follow urine culture results.  5. Small bowel obstruction/ileus-patient had diarrhea this morning, abdomen x-ray showed findings consistent with mechanical SBO.  We will keep patient n.p.o., started on IV fluids as above.  Consider general surgery consultation if patient does not improve clinically in next 12 to 24 hours.  6. Interstitial changes seen on chest x-ray-chest x-ray shows interstitial inflammatory changes.  Patient denies coughing or shortness of breath.  Consider CT chest high-resolution, once patient is more stable and renal function improves.    DVT Prophylaxis-    SCDs   AM Labs Ordered, also please review Full Orders  Family Communication: Admission, patients condition and plan of care including tests being ordered have been discussed with the patient who indicate understanding and agree with the plan and Code Status.  Code Status: Full code  Admission status: Observation: Based on patients clinical presentation and evaluation of above clinical data, I have made determination that patient will need less than 2 midnight stay in the hospital.  Time spent in minutes : 60 minutes   Meredeth Ide M.D on 05/07/2018 at 8:28 PM

## 2018-05-07 NOTE — ED Triage Notes (Signed)
Pt reports n/v/d x 3 days. Family member at home with similar symptoms. Pt reports that today he began having black "blood" in his vomit. Pt not currently on anticoagulation medication. Pt reports abdominal soreness from frequent vomiting.

## 2018-05-08 ENCOUNTER — Encounter (HOSPITAL_COMMUNITY): Payer: Self-pay

## 2018-05-08 ENCOUNTER — Other Ambulatory Visit: Payer: Self-pay

## 2018-05-08 ENCOUNTER — Encounter (HOSPITAL_COMMUNITY)
Admission: EM | Disposition: A | Discharge status: Home / Self Care | Payer: Self-pay | Source: Home / Self Care | Attending: Family Medicine

## 2018-05-08 ENCOUNTER — Inpatient Hospital Stay (HOSPITAL_COMMUNITY): Payer: Medicare Other | Admitting: Anesthesiology

## 2018-05-08 DIAGNOSIS — M1990 Unspecified osteoarthritis, unspecified site: Secondary | ICD-10-CM | POA: Diagnosis present

## 2018-05-08 DIAGNOSIS — E860 Dehydration: Secondary | ICD-10-CM | POA: Diagnosis present

## 2018-05-08 DIAGNOSIS — K567 Ileus, unspecified: Secondary | ICD-10-CM | POA: Diagnosis present

## 2018-05-08 DIAGNOSIS — Z791 Long term (current) use of non-steroidal anti-inflammatories (NSAID): Secondary | ICD-10-CM | POA: Diagnosis not present

## 2018-05-08 DIAGNOSIS — K449 Diaphragmatic hernia without obstruction or gangrene: Secondary | ICD-10-CM | POA: Diagnosis present

## 2018-05-08 DIAGNOSIS — X58XXXA Exposure to other specified factors, initial encounter: Secondary | ICD-10-CM | POA: Diagnosis present

## 2018-05-08 DIAGNOSIS — K2211 Ulcer of esophagus with bleeding: Secondary | ICD-10-CM | POA: Diagnosis present

## 2018-05-08 DIAGNOSIS — E785 Hyperlipidemia, unspecified: Secondary | ICD-10-CM | POA: Diagnosis present

## 2018-05-08 DIAGNOSIS — K219 Gastro-esophageal reflux disease without esophagitis: Secondary | ICD-10-CM | POA: Diagnosis present

## 2018-05-08 DIAGNOSIS — Z79899 Other long term (current) drug therapy: Secondary | ICD-10-CM | POA: Diagnosis not present

## 2018-05-08 DIAGNOSIS — T39395A Adverse effect of other nonsteroidal anti-inflammatory drugs [NSAID], initial encounter: Secondary | ICD-10-CM | POA: Diagnosis present

## 2018-05-08 DIAGNOSIS — K228 Other specified diseases of esophagus: Secondary | ICD-10-CM

## 2018-05-08 DIAGNOSIS — Z23 Encounter for immunization: Secondary | ICD-10-CM | POA: Diagnosis present

## 2018-05-08 DIAGNOSIS — H9190 Unspecified hearing loss, unspecified ear: Secondary | ICD-10-CM | POA: Diagnosis present

## 2018-05-08 DIAGNOSIS — E669 Obesity, unspecified: Secondary | ICD-10-CM | POA: Diagnosis present

## 2018-05-08 DIAGNOSIS — Z96652 Presence of left artificial knee joint: Secondary | ICD-10-CM | POA: Diagnosis present

## 2018-05-08 DIAGNOSIS — D62 Acute posthemorrhagic anemia: Secondary | ICD-10-CM | POA: Diagnosis present

## 2018-05-08 DIAGNOSIS — K210 Gastro-esophageal reflux disease with esophagitis: Secondary | ICD-10-CM | POA: Diagnosis present

## 2018-05-08 DIAGNOSIS — N390 Urinary tract infection, site not specified: Secondary | ICD-10-CM | POA: Diagnosis present

## 2018-05-08 DIAGNOSIS — Z6837 Body mass index (BMI) 37.0-37.9, adult: Secondary | ICD-10-CM | POA: Diagnosis not present

## 2018-05-08 DIAGNOSIS — K920 Hematemesis: Secondary | ICD-10-CM | POA: Diagnosis not present

## 2018-05-08 DIAGNOSIS — N179 Acute kidney failure, unspecified: Secondary | ICD-10-CM | POA: Diagnosis present

## 2018-05-08 DIAGNOSIS — K2970 Gastritis, unspecified, without bleeding: Secondary | ICD-10-CM | POA: Diagnosis present

## 2018-05-08 HISTORY — PX: ESOPHAGOGASTRODUODENOSCOPY (EGD) WITH PROPOFOL: SHX5813

## 2018-05-08 LAB — COMPREHENSIVE METABOLIC PANEL
ALT: 24 U/L (ref 0–44)
ANION GAP: 5 (ref 5–15)
AST: 25 U/L (ref 15–41)
Albumin: 3.3 g/dL — ABNORMAL LOW (ref 3.5–5.0)
Alkaline Phosphatase: 48 U/L (ref 38–126)
BUN: 31 mg/dL — ABNORMAL HIGH (ref 8–23)
CO2: 25 mmol/L (ref 22–32)
Calcium: 7.7 mg/dL — ABNORMAL LOW (ref 8.9–10.3)
Chloride: 108 mmol/L (ref 98–111)
Creatinine, Ser: 1.17 mg/dL (ref 0.61–1.24)
GFR calc Af Amer: >60 mL/min (ref >60)
GFR calc non Af Amer: 57 mL/min — ABNORMAL LOW (ref >60)
GLUCOSE: 118 mg/dL — AB (ref 70–99)
Potassium: 3.9 mmol/L (ref 3.5–5.1)
Sodium: 138 mmol/L (ref 135–145)
Total Bilirubin: 0.6 mg/dL (ref 0.3–1.2)
Total Protein: 6.3 g/dL — ABNORMAL LOW (ref 6.5–8.1)

## 2018-05-08 LAB — CBC
HCT: 35.5 % — ABNORMAL LOW (ref 39.0–52.0)
Hemoglobin: 10.1 g/dL — ABNORMAL LOW (ref 13.0–17.0)
MCH: 23.9 pg — ABNORMAL LOW (ref 26.0–34.0)
MCHC: 28.5 g/dL — ABNORMAL LOW (ref 30.0–36.0)
MCV: 83.9 fL (ref 80.0–100.0)
PLATELETS: 197 10*3/uL (ref 150–400)
RBC: 4.23 MIL/uL (ref 4.22–5.81)
RDW: 18 % — ABNORMAL HIGH (ref 11.5–15.5)
WBC: 5 10*3/uL (ref 4.0–10.5)
nRBC: 0 % (ref 0.0–0.2)

## 2018-05-08 SURGERY — ESOPHAGOGASTRODUODENOSCOPY (EGD) WITH PROPOFOL
Anesthesia: General

## 2018-05-08 MED ORDER — LACTATED RINGERS IV SOLN
INTRAVENOUS | Status: DC
  Administered 2018-05-08: 12:00:00 via INTRAVENOUS

## 2018-05-08 MED ORDER — PROMETHAZINE HCL 25 MG/ML IJ SOLN: 6.2500 mg | INTRAMUSCULAR | Status: DC | PRN

## 2018-05-08 MED ORDER — HYDROMORPHONE HCL 1 MG/ML IJ SOLN: 0.2500 mg | INTRAMUSCULAR | Status: DC | PRN

## 2018-05-08 MED ORDER — KETAMINE HCL 10 MG/ML IJ SOLN
INTRAMUSCULAR | Status: DC | PRN
  Administered 2018-05-08: 10 mg via INTRAVENOUS

## 2018-05-08 MED ORDER — SODIUM CHLORIDE 0.9 % IV SOLN: INTRAVENOUS | Status: DC

## 2018-05-08 MED ORDER — MIDAZOLAM HCL 2 MG/2ML IJ SOLN: 0.5000 mg | Freq: Once | INTRAMUSCULAR | Status: DC | PRN

## 2018-05-08 MED ORDER — PROPOFOL 500 MG/50ML IV EMUL
INTRAVENOUS | Status: DC | PRN
  Administered 2018-05-08: 125 ug/kg/min via INTRAVENOUS

## 2018-05-08 MED ORDER — PROPOFOL 10 MG/ML IV BOLUS
INTRAVENOUS | Status: AC
  Filled 2018-05-08: qty 20

## 2018-05-08 MED ORDER — KETAMINE HCL 50 MG/5ML IJ SOSY
PREFILLED_SYRINGE | INTRAMUSCULAR | Status: AC
  Filled 2018-05-08: qty 5

## 2018-05-08 MED ORDER — HYDROCODONE-ACETAMINOPHEN 7.5-325 MG PO TABS: 1.0000 | ORAL_TABLET | Freq: Once | ORAL | Status: DC | PRN

## 2018-05-08 NOTE — Transfer of Care (Signed)
Immediate Anesthesia Transfer of Care Note  Patient: Rushil Solazzo [facility]  Procedure(s) Performed: ESOPHAGOGASTRODUODENOSCOPY (EGD) WITH PROPOFOL (N/A )  Patient Location: PACU  Anesthesia Type:General  Level of Consciousness: awake and patient cooperative  Airway & Oxygen Therapy: Patient Spontanous Breathing and Patient connected to nasal cannula oxygen  Post-op Assessment: Report given to RN, Post -op Vital signs reviewed and stable and Patient moving all extremities  Post vital signs: Reviewed and stable  Last Vitals:  Vitals Value Taken Time  BP 112/65 05/08/2018 12:48 PM  Temp 36.5 C 05/08/2018 12:48 PM  Pulse 65 05/08/2018 12:53 PM  Resp 17 05/08/2018 12:53 PM  SpO2 99 % 05/08/2018 12:53 PM  Vitals shown include unvalidated device data.  Last Pain:  Vitals:   05/08/18 1248  TempSrc:   PainSc: 0-No pain         Complications: No apparent anesthesia complications

## 2018-05-08 NOTE — Op Note (Signed)
[facility] Patient Name: Angel Norman Procedure Date: 05/08/2018 12:25 PM MRN: [id number] Date of Birth: Apr 18, 1932 Attending MD: Lionel December , MD CSN: [id number] Age: 85 Admit Type: Inpatient Procedure:                Upper GI endoscopy Indications:              Coffee-ground emesis Providers:                Lionel December, MD, Loma Messing B. Patsy Lager, RN, Burke Keels, Technician, Pandora Leiter, Technician Referring MD:             Myrtie Neither,, MD Medicines:                Propofol per Anesthesia Complications:            No immediate complications. Estimated Blood Loss:     Estimated blood loss: none. Procedure:                Pre-Anesthesia Assessment:                           - Prior to the procedure, a History and Physical                            was performed, and patient medications and                            allergies were reviewed. The patient's tolerance of                            previous anesthesia was also reviewed. The risks                            and benefits of the procedure and the sedation                            options and risks were discussed with the patient.                            All questions were answered, and informed consent                            was obtained. Prior Anticoagulants: The patient                            last took naproxen 2 days prior to the procedure.                            ASA Grade Assessment: II - A patient with mild                            systemic disease. After reviewing the risks and  benefits, the patient was deemed in satisfactory                            condition to undergo the procedure.                           After obtaining informed consent, the endoscope was                            passed under direct vision. Throughout the                            procedure, the patient's blood pressure, pulse, and       oxygen saturations were monitored continuously. The                            [id number]-[id number] ([id number]) scope was introduced through the                            and advanced to the second part of duodenum. The                            upper GI endoscopy was accomplished without                            difficulty. The patient tolerated the procedure                            well. Scope In: 12:34:05 PM Scope Out: 12:40:51 PM Total Procedure Duration: 0 hours 6 minutes 46 seconds  Findings:      The proximal esophagus and mid esophagus were normal.      LA Grade C (one or more mucosal breaks continuous between tops of 2 or       more mucosal folds, less than 75% circumference) esophagitis with no       bleeding was found 35 to 41 cm from the incisors.      The Z-line was irregular and was found 41 cm from the incisors.      A 3 cm hiatal hernia was present.      Patchy mild inflammation characterized by congestion (edema), erythema       and granularity was found in the gastric antrum.      The exam of the stomach was otherwise normal.      The duodenal bulb and second portion of the duodenum were normal. Impression:               - Normal proximal esophagus and mid esophagus.                           - LA Grade C reflux esophagitis.                           - Z-line irregular, 41 cm from the incisors.                           - 3 cm  hiatal hernia.                           - Gastritis.                           - Normal duodenal bulb and second portion of the                            duodenum.                           - No specimens collected. Moderate Sedation:      Per Anesthesia Care Recommendation:           - Return patient to hospital ward for ongoing care.                           - Low sodium diet today.                           - Continue present medications.                           - H.Pylori serology.                           - Continue Pantoprazole 40 mg  po bid fro 12 weeks                            thereafter once daily. Procedure Code(s):        --- Professional ---                           [phone], Esophagogastroduodenoscopy, flexible,                            transoral; diagnostic, including collection of                            specimen(s) by brushing or washing, when performed                            (separate procedure) Diagnosis Code(s):        --- Professional ---                           K21.0, Gastro-esophageal reflux disease with                            esophagitis                           K22.8, Other specified diseases of esophagus                           K44.9, Diaphragmatic hernia without obstruction or  gangrene                           K29.70, Gastritis, unspecified, without bleeding                           K92.0, Hematemesis CPT copyright 2018 [employer]. All rights reserved. The codes documented in this report are preliminary and upon coder review may  be revised to meet current compliance requirements. Lionel December, MD Lionel December, MD 05/08/2018 12:49:30 PM This report has been signed electronically. Number of Addenda: 0

## 2018-05-08 NOTE — Progress Notes (Signed)
Patient admitted to [facility] with the diagnosis of AKI. A & O x 4. Denied any acute pain at this moment. Patient oriented to staff, ascom/call bell. Voice no concern. Will continue to monitor.

## 2018-05-08 NOTE — Anesthesia Postprocedure Evaluation (Signed)
Anesthesia Post Note  Patient: Soo Rall [facility]  Procedure(s) Performed: ESOPHAGOGASTRODUODENOSCOPY (EGD) WITH PROPOFOL (N/A )  Patient location during evaluation: PACU Anesthesia Type: General Level of consciousness: awake and patient cooperative Pain management: pain level controlled Vital Signs Assessment: post-procedure vital signs reviewed and stable Respiratory status: spontaneous breathing, nonlabored ventilation and respiratory function stable Cardiovascular status: blood pressure returned to baseline Postop Assessment: no apparent nausea or vomiting Anesthetic complications: no     Last Vitals:  Vitals:   05/08/18 1154 05/08/18 1248  BP: 128/73 112/65  Pulse: 65 67  Resp: 16 12  Temp:  36.5 C  SpO2: 98% 97%    Last Pain:  Vitals:   05/08/18 1248  TempSrc:   PainSc: 0-No pain                 Hiliana Eilts J

## 2018-05-08 NOTE — Progress Notes (Addendum)
PROGRESS NOTE  Giselle Capell [facility] ZOX:[id number] DOB: Mar 12, 1932 DOA: 05/07/2018 PCP: Arabella Merles, CNM  HPI/Recap of past 24 hours: This is an 85 year old male with a history of GERD, hyperlipidemia and hypertension who was admitted for diarrhea abdominal pain vomiting of dark vomitus he was found to have positive fecal occult blood fecal occult stool blood test And acute kidney injury and urinary tract infection.  Abdominal x-ray showed possible small bowel obstruction.  He was started on Rocephin for his urinary tract infection  Subjective: Patient seen and examined at bedside he denies any other complaints no pain, he has not passed flatus however  assessment/Plan: Active Problems:   AKI (acute kidney injury) (HCC)  1.  Acute kidney injury patient is being rehydrated  2.  GI bleed he underwent EGD today and they found bleeding esophagitis, hiatal hernia and gastritis that is no bleeding.  We will continue PPI pantoprazole 40 mg twice a day ,his H pylori culture still pending we will continue to monitor his H&H  3.GERD continue PPI.    4. urinary tract infection.  Patient was started on ceftriaxone continue ceftriaxone     Code Status: Full  Severity of Illness: The appropriate patient status for this patient is INPATIENT. Inpatient status is judged to be reasonable and necessary in order to provide the required intensity of service to ensure the patient's safety. The patient's presenting symptoms, physical exam findings, and initial radiographic and laboratory data in the context of their chronic comorbidities is felt to place them at high risk for further clinical deterioration. Furthermore, it is not anticipated that the patient will be medically stable for discharge from the hospital within 2 midnights of admission. The following factors support the patient status of inpatient.   " Patient admitted with positive occult stool and he has dropped his hemoglobin by 2 points from 12.2  on admission to 10.1 today.  He was changed from inpatient observation to inpatient.  He also underwent EGD * I certify that at the point of admission it is my clinical judgment that the patient will require inpatient hospital care spanning beyond 2 midnights from the point of admission due to high intensity of service, high risk for further deterioration and high frequency of surveillance required.*    Family Communication: None at bedside  Disposition Plan: Home   Consultants: GI  Lionel December, MD Procedures: [phone], Esophagogastroduodenoscopy, flexible, transoral; diagnostic, including collection of specimen(s) by brushing or washing, when performed (separate  procedure) May 08, 2018  Antimicrobials:  Ceftriaxone  DVT prophylaxis: SCD   Objective: Vitals:   05/07/18 2030 05/07/18 2139 05/07/18 2143 05/08/18 0341  BP: 109/63 (!) 101/47 120/65 118/66  Pulse: 88 78 82 65  Resp: 19 18  20   Temp:  98 F (36.7 C)  97.6 F (36.4 C)  TempSrc:  Oral  Oral  SpO2: 95% 95% 96% 95%  Weight:      Height:        Intake/Output Summary (Last 24 hours) at 05/08/2018 1127 Last data filed at 05/08/2018 0700 Gross per 24 hour  Intake 2196.95 ml  Output -  Net 2196.95 ml   Filed Weights   05/07/18 1520  Weight: 136.1 kg   Body mass index is 37.5 kg/m.  Exam:  . General: 85 y.o. year-old male well developed well nourished in no acute distress.  Alert and oriented x3.  Obese . Cardiovascular: Regular rate and rhythm with no rubs or gallops.  No thyromegaly or JVD  noted.   . Respiratory: Clear to auscultation with no wheezes or rales. Good inspiratory effort. . Abdomen: Soft nontender ,distended with decreased l bowel sounds x4 quadrants. . Musculoskeletal: No lower extremity edema. 2/4 pulses in all 4 extremities. . Skin: No ulcerative lesions noted or rashes, . Psychiatry: Mood is appropriate for condition and setting    Data Reviewed: CBC: Recent Labs  Lab  05/07/18 1532 05/08/18 0532  WBC 10.2 5.0  HGB 12.2* 10.1*  HCT 43.3 35.5*  MCV 82.8 83.9  PLT 279 197   Basic Metabolic Panel: Recent Labs  Lab 05/07/18 1532 05/08/18 0532  NA 139 138  K 4.1 3.9  CL 103 108  CO2 26 25  GLUCOSE 144* 118*  BUN 31* 31*  CREATININE 1.33* 1.17  CALCIUM 8.9 7.7*   GFR: Estimated Creatinine Clearance: 71.1 mL/min (by C-G formula based on SCr of 1.17 mg/dL). Liver Function Tests: Recent Labs  Lab 05/07/18 1532 05/08/18 0532  AST 30 25  ALT 30 24  ALKPHOS 65 48  BILITOT 0.9 0.6  PROT 8.2* 6.3*  ALBUMIN 4.2 3.3*   Recent Labs  Lab 05/07/18 1532  LIPASE 26   No results for input(s): AMMONIA in the last 168 hours. Coagulation Profile: No results for input(s): INR, PROTIME in the last 168 hours. Cardiac Enzymes: No results for input(s): CKTOTAL, CKMB, CKMBINDEX, TROPONINI in the last 168 hours. BNP (last 3 results) No results for input(s): PROBNP in the last 8760 hours. HbA1C: No results for input(s): HGBA1C in the last 72 hours. CBG: No results for input(s): GLUCAP in the last 168 hours. Lipid Profile: No results for input(s): CHOL, HDL, LDLCALC, TRIG, CHOLHDL, LDLDIRECT in the last 72 hours. Thyroid Function Tests: No results for input(s): TSH, T4TOTAL, FREET4, T3FREE, THYROIDAB in the last 72 hours. Anemia Panel: No results for input(s): VITAMINB12, FOLATE, FERRITIN, TIBC, IRON, RETICCTPCT in the last 72 hours. Urine analysis:    Component Value Date/Time   COLORURINE AMBER (A) 05/07/2018 1705   APPEARANCEUR CLOUDY (A) 05/07/2018 1705   LABSPEC 1.026 05/07/2018 1705   PHURINE 5.0 05/07/2018 1705   GLUCOSEU NEGATIVE 05/07/2018 1705   HGBUR NEGATIVE 05/07/2018 1705   BILIRUBINUR NEGATIVE 05/07/2018 1705   KETONESUR NEGATIVE 05/07/2018 1705   PROTEINUR 30 (A) 05/07/2018 1705   NITRITE POSITIVE (A) 05/07/2018 1705   LEUKOCYTESUR SMALL (A) 05/07/2018 1705   Sepsis Labs: @LABRCNTIP (procalcitonin:4,lacticidven:4)  )No  results found for this or any previous visit (from the past 240 hour(s)).    Studies: Dg Abd Acute 2+v W 1v Chest  Result Date: 05/07/2018 CLINICAL DATA:  Vomiting EXAM: DG ABDOMEN ACUTE W/ 1V CHEST COMPARISON:  12/12/2014, 12/08/2014, 12/06/2014 FINDINGS: Single-view chest demonstrates left-sided pleural thickening and bilateral calcified pleural plaques. Bilateral interstitial opacity. No focal consolidation. Stable cardiomediastinal silhouette. 9 mm possible lung nodule at the right CP angle. Supine and upright views of the abdomen demonstrate no free air beneath the diaphragm. Multiple loops of dilated small bowel within the central abdomen measuring up to 4.8 cm with multiple fluid levels. Paucity of distal bowel gas. IMPRESSION: 1. Prominent interstitial opacity could be secondary to interstitial inflammatory process. Bilateral calcified pleural plaques and left pleural thickening, chronic. Possible 9 mm right CP angle lung nodule. Chest CT follow-up may be obtained for further evaluation. 2. Findings consistent with mechanical small bowel obstruction. No free air. Electronically Signed   By: Jasmine Pang M.D.   On: 05/07/2018 18:47    Scheduled Meds: . pantoprazole (PROTONIX) IV  40 mg Intravenous Q12H  . pneumococcal 23 valent vaccine  0.5 mL Intramuscular Tomorrow-1000    Continuous Infusions: . sodium chloride 100 mL/hr at 05/08/18 1009  . cefTRIAXone (ROCEPHIN)  IV       LOS: 0 days     Myrtie NeitherNwannadiya Madaleine Simmon, MD [facility]  To reach me or the doctor on call, go to: [email or web address] Password [email or web address]  05/08/2018, 11:27 AM

## 2018-05-08 NOTE — Consult Note (Signed)
Referring Provider: [facility] Primary Care Physician:  Arabella Merles, CNM Primary Gastroenterologist:  Dr.  Date of Admission: 05/07/18 Date of Consultation: 05/08/18  Reason for Consultation:  N/V/D, hematemesis, iFOBT+  HPI:  Angel Norman is a 85 y.o. male with a past medical history of GERD, hyperlipidemia.  Hospitalization P and ER provider note is reviewed.  The patient presented with 3-day history of nausea, vomiting, diarrhea including hematemesis when he vomited dark blood the morning of.  Complains of abdominal pain as well, naproxen as needed for pain.  FOBT in the ER was positive but hemoglobin stable at 12.2.  Acute kidney injury with a baseline creatinine 0.95 found to be 1.33 in the ER.  Abdominal x-ray with concern for small bowel obstruction.  Admitted for further monitoring and treatment.  GI symptoms felt to be likely viral gastroenteritis.  He was started on Protonix 40 mg twice daily.  Admitted n.p.o. and consideration of general surgery consult if no improvement in next 24 hours given x-ray with findings consistent of mechanical SBO.  The CBC today showed a decline from 12.2 yesterday to 10.1 today.  Today he states he is not having any abdominal pain.  He had one episode of emesis yesterday which was black and was told by his physician that this looked like blood.  He does have daily heartburn symptoms and takes Tums every evening.  He takes naproxen (NSAID) about once a week.  No previous colonoscopy or endoscopy.  Denies any current nausea or vomiting.  Denies any other GI symptoms at this time.  Past Medical History:  Diagnosis Date  . Arthritis   . GERD (gastroesophageal reflux disease)   . Hyperlipidemia   . Rib fractures 12/06/2014    Past Surgical History:  Procedure Laterality Date  . goiter removal    . TOTAL KNEE ARTHROPLASTY Left 08/18/2012   Dr Lajoyce Corners  . TOTAL KNEE ARTHROPLASTY Left 08/19/2012   Procedure: TOTAL KNEE ARTHROPLASTY;  Surgeon: Nadara Mustard, MD;  Location: [facility] OR;  Service: Orthopedics;  Laterality: Left;  Left Total Knee Arthroplasty    Prior to Admission medications   Not on File    Current Facility-Administered Medications  Medication Dose Route Frequency Provider Last Rate Last Dose  . 0.9 %  sodium chloride infusion   Intravenous Continuous Meredeth Ide, MD 100 mL/hr at 05/07/18 2314    . acetaminophen (TYLENOL) tablet 650 mg  650 mg Oral Q6H PRN Meredeth Ide, MD       Or  . acetaminophen (TYLENOL) suppository 650 mg  650 mg Rectal Q6H PRN Meredeth Ide, MD      . cefTRIAXone (ROCEPHIN) 1 g in sodium chloride 0.9 % 100 mL IVPB  1 g Intravenous Q24H Sharl Ma, Sarina Ill, MD      . ondansetron (ZOFRAN) tablet 4 mg  4 mg Oral Q6H PRN Meredeth Ide, MD       Or  . ondansetron (ZOFRAN) injection 4 mg  4 mg Intravenous Q6H PRN Meredeth Ide, MD      . pantoprazole (PROTONIX) injection 40 mg  40 mg Intravenous Q12H Lama, Sarina Ill, MD      . pneumococcal 23 valent vaccine (PNU-IMMUNE) injection 0.5 mL  0.5 mL Intramuscular Tomorrow-1000 Meredeth Ide, MD        Allergies as of 05/07/2018  . (No Known Allergies)    No family history on file.  Social History   Socioeconomic History  . Marital  status: Married    Spouse name: Not on file  . Number of children: Not on file  . Years of education: Not on file  . Highest education level: Not on file  Occupational History  . Not on file  Social Needs  . Financial resource strain: Not on file  . Food insecurity:    Worry: Not on file    Inability: Not on file  . Transportation needs:    Medical: Not on file    Non-medical: Not on file  Tobacco Use  . Smoking status: Never Smoker  . Smokeless tobacco: Never Used  Substance and Sexual Activity  . Alcohol use: No  . Drug use: No  . Sexual activity: Not on file  Lifestyle  . Physical activity:    Days per week: Not on file    Minutes per session: Not on file  . Stress: Not on file  Relationships  . Social  connections:    Talks on phone: Not on file    Gets together: Not on file    Attends religious service: Not on file    Active member of club or organization: Not on file    Attends meetings of clubs or organizations: Not on file    Relationship status: Not on file  . Intimate partner violence:    Fear of current or ex partner: Not on file    Emotionally abused: Not on file    Physically abused: Not on file    Forced sexual activity: Not on file  Other Topics Concern  . Not on file  Social History Narrative  . Not on file    Review of Systems: General: Negative for anorexia, weight loss, fever, chills, fatigue, weakness. ENT: Negative for hoarseness, difficulty swallowing. CV: Negative for chest pain, angina, palpitations, peripheral edema.  Respiratory: Negative for dyspnea at rest, cough, sputum, wheezing.  GI: See history of present illness. MS: Negative for joint pain, low back pain.  Derm: Negative for rash or itching.  Endo: Negative for unusual weight change.  Heme: Negative for bruising or bleeding. Allergy: Negative for rash or hives.  Physical Exam: Vital signs in last 24 hours: Temp:  [97.6 F (36.4 C)-98 F (36.7 C)] 97.6 F (36.4 C) (03/06 0341) Pulse Rate:  [65-96] 65 (03/06 0341) Resp:  [15-20] 20 (03/06 0341) BP: (101-139)/(47-69) 118/66 (03/06 0341) SpO2:  [93 %-96 %] 95 % (03/06 0341) Weight:  [136.1 kg] 136.1 kg (03/05 1520) Last BM Date: 05/07/18 General:   Alert,  Well-developed, well-nourished, pleasant and cooperative in NAD Head:  Normocephalic and atraumatic. Eyes:  Sclera clear, no icterus. Conjunctiva pink. Ears:  Normal auditory acuity. Neck:  Supple; no masses or thyromegaly. Lungs:  Clear throughout to auscultation. No wheezes, crackles, or rhonchi. No acute distress. Heart:  Regular rate and rhythm; no murmurs, clicks, rubs,  or gallops. Abdomen:  Soft, nontender and nondistended. No masses, hepatosplenomegaly or hernias noted. Normal  bowel sounds, without guarding, and without rebound.   Rectal:  Deferred until time of colonoscopy.   Msk:  Symmetrical without gross deformities. Pulses:  Normal bilateral DP pulses noted. Extremities:  Without clubbing or edema. Neurologic:  Alert and  oriented x4;  grossly normal neurologically. Psych:  Alert and cooperative. Normal mood and affect.  Intake/Output from previous day: 03/05 0701 - 03/06 0700 In: 2197 [I.V.:750.2; IV Piggyback:1446.8] Out: -  Intake/Output this shift: No intake/output data recorded.  Lab Results: Recent Labs    05/07/18 1532 05/08/18 0532  WBC 10.2 5.0  HGB 12.2* 10.1*  HCT 43.3 35.5*  PLT 279 197   BMET Recent Labs    05/07/18 1532 05/08/18 0532  NA 139 138  K 4.1 3.9  CL 103 108  CO2 26 25  GLUCOSE 144* 118*  BUN 31* 31*  CREATININE 1.33* 1.17  CALCIUM 8.9 7.7*   LFT Recent Labs    05/07/18 1532 05/08/18 0532  PROT 8.2* 6.3*  ALBUMIN 4.2 3.3*  AST 30 25  ALT 30 24  ALKPHOS 65 48  BILITOT 0.9 0.6   PT/INR No results for input(s): LABPROT, INR in the last 72 hours. Hepatitis Panel No results for input(s): HEPBSAG, HCVAB, HEPAIGM, HEPBIGM in the last 72 hours. C-Diff No results for input(s): CDIFFTOX in the last 72 hours.  Studies/Results: Dg Abd Acute 2+v W 1v Chest  Result Date: 05/07/2018 CLINICAL DATA:  Vomiting EXAM: DG ABDOMEN ACUTE W/ 1V CHEST COMPARISON:  12/12/2014, 12/08/2014, 12/06/2014 FINDINGS: Single-view chest demonstrates left-sided pleural thickening and bilateral calcified pleural plaques. Bilateral interstitial opacity. No focal consolidation. Stable cardiomediastinal silhouette. 9 mm possible lung nodule at the right CP angle. Supine and upright views of the abdomen demonstrate no free air beneath the diaphragm. Multiple loops of dilated small bowel within the central abdomen measuring up to 4.8 cm with multiple fluid levels. Paucity of distal bowel gas. IMPRESSION: 1. Prominent interstitial opacity could  be secondary to interstitial inflammatory process. Bilateral calcified pleural plaques and left pleural thickening, chronic. Possible 9 mm right CP angle lung nodule. Chest CT follow-up may be obtained for further evaluation. 2. Findings consistent with mechanical small bowel obstruction. No free air. Electronically Signed   By: Jasmine Pang M.D.   On: 05/07/2018 18:47    Impression: Very pleasant 85 year old male who presents emergency department with complaints of hematemesis and found to have heme positive stool in the ER.  He complains of daily heartburn and takes Tums.  He takes naproxen once a day.  Only one episode of emesis with bleeding.  He has had about a 1 to 2 g drop in his hemoglobin.  Abdominal x-ray showed consistent with mechanical small bowel obstruction, however his bowel sounds are present and he is not having any abdominal pain on exam.  I question the accuracy of potential SBO.  Possible differentials for his upper GI bleed include esophagitis, gastritis, duodenitis, peptic ulcer disease, duodenal ulcer all in the setting of NSAIDs.  Other possibilities include Mallory-Weiss tear in the setting of vomiting, although this is less likely given he is only had one episode.  The patient's daughter is at bedside with him.  She wrote down a list of his medications when her mom called her.  No anticoagulants noted on the list.  Case discussed with Dr. Karilyn Cota and will plan for an upper endoscopy today.  Proceed with EGD with Dr. Karilyn Cota in near future: the risks, benefits, and alternatives have been discussed with the patient in detail. The patient states understanding and desires to proceed.  Plan: 1. Plan for EGD on propofol today 2. Continue PPI twice daily 3. Monitor for further GI bleeding 4. Monitor hemoglobin closely 5. Further recommendations to follow 6. Supportive measures   Thank you for allowing [place] to participate in the care of Angel Norman [facility]  Wynne Dust, DNP,  AGNP-C Adult & Gerontological Nurse Practitioner [facility]    LOS: 0 days     05/08/2018, 8:55 AM

## 2018-05-08 NOTE — Anesthesia Preprocedure Evaluation (Signed)
Anesthesia Evaluation  Patient identified by MRN, date of birth, ID band Patient awake    Reviewed: Allergy & Precautions, NPO status , Patient's Chart, lab work & pertinent test results  Airway Mallampati: II  TM Distance: >3 FB Neck ROM: Full    Dental no notable dental hx. (+) Edentulous Upper, Edentulous Lower   Pulmonary neg pulmonary ROS,    Pulmonary exam normal breath sounds clear to auscultation       Cardiovascular Exercise Tolerance: Good negative cardio ROS Normal cardiovascular examI Rhythm:Regular Rate:Normal  [place]    Neuro/Psych negative neurological ROS  negative psych ROS   GI/Hepatic Neg liver ROS, GERD  ,occ Tums  Here for EGD   Endo/Other  negative endocrine ROS  Renal/GU Renal InsufficiencyRenal disease  negative genitourinary   Musculoskeletal  (+) Arthritis , Osteoarthritis,    Abdominal   Peds negative pediatric ROS (+)  Hematology negative hematology ROS (+)   Anesthesia Other Findings   Reproductive/Obstetrics negative OB ROS                             Anesthesia Physical Anesthesia Plan  ASA: II  Anesthesia Plan: General   Post-op Pain Management:    Induction: Intravenous  PONV Risk Score and Plan:   Airway Management Planned: Nasal Cannula and Simple Face Mask  Additional Equipment:   Intra-op Plan:   Post-operative Plan:   Informed Consent: I have reviewed the patients History and Physical, chart, labs and discussed the procedure including the risks, benefits and alternatives for the proposed anesthesia with the patient or authorized representative who has indicated his/her understanding and acceptance.     Dental advisory given  Plan Discussed with: CRNA  Anesthesia Plan Comments:         Anesthesia Quick Evaluation

## 2018-05-09 DIAGNOSIS — D62 Acute posthemorrhagic anemia: Secondary | ICD-10-CM | POA: Diagnosis present

## 2018-05-09 DIAGNOSIS — N390 Urinary tract infection, site not specified: Secondary | ICD-10-CM | POA: Diagnosis present

## 2018-05-09 DIAGNOSIS — K922 Gastrointestinal hemorrhage, unspecified: Secondary | ICD-10-CM | POA: Diagnosis present

## 2018-05-09 LAB — BASIC METABOLIC PANEL
Anion gap: 6 (ref 5–15)
BUN: 24 mg/dL — ABNORMAL HIGH (ref 8–23)
CO2: 23 mmol/L (ref 22–32)
Calcium: 7.5 mg/dL — ABNORMAL LOW (ref 8.9–10.3)
Chloride: 108 mmol/L (ref 98–111)
Creatinine, Ser: 1.02 mg/dL (ref 0.61–1.24)
Glucose, Bld: 109 mg/dL — ABNORMAL HIGH (ref 70–99)
Potassium: 4 mmol/L (ref 3.5–5.1)
Sodium: 137 mmol/L (ref 135–145)

## 2018-05-09 LAB — CBC WITH DIFFERENTIAL/PLATELET
Abs Immature Granulocytes: 0.01 10*3/uL (ref 0.00–0.07)
Basophils Absolute: 0 10*3/uL (ref 0.0–0.1)
Basophils Relative: 1 %
Eosinophils Absolute: 0.2 10*3/uL (ref 0.0–0.5)
Eosinophils Relative: 4 %
HEMATOCRIT: 33 % — AB (ref 39.0–52.0)
Hemoglobin: 9.2 g/dL — ABNORMAL LOW (ref 13.0–17.0)
Immature Granulocytes: 0 %
LYMPHS ABS: 0.9 10*3/uL (ref 0.7–4.0)
Lymphocytes Relative: 22 %
MCH: 24 pg — ABNORMAL LOW (ref 26.0–34.0)
MCHC: 27.9 g/dL — ABNORMAL LOW (ref 30.0–36.0)
MCV: 86.2 fL (ref 80.0–100.0)
Monocytes Absolute: 0.5 10*3/uL (ref 0.1–1.0)
Monocytes Relative: 13 %
Neutro Abs: 2.6 10*3/uL (ref 1.7–7.7)
Neutrophils Relative %: 60 %
Platelets: 176 10*3/uL (ref 150–400)
RBC: 3.83 MIL/uL — ABNORMAL LOW (ref 4.22–5.81)
RDW: 18.3 % — ABNORMAL HIGH (ref 11.5–15.5)
WBC: 4.2 10*3/uL (ref 4.0–10.5)
nRBC: 0 % (ref 0.0–0.2)

## 2018-05-09 LAB — URINALYSIS, ROUTINE W REFLEX MICROSCOPIC
Bilirubin Urine: NEGATIVE
Glucose, UA: NEGATIVE mg/dL
Hgb urine dipstick: NEGATIVE
Ketones, ur: NEGATIVE mg/dL
Leukocytes,Ua: NEGATIVE
Nitrite: NEGATIVE
PROTEIN: NEGATIVE mg/dL
Specific Gravity, Urine: 1.02 (ref 1.005–1.030)
pH: 6 (ref 5.0–8.0)

## 2018-05-09 LAB — HEMOGLOBIN AND HEMATOCRIT, BLOOD
HCT: 35.2 % — ABNORMAL LOW (ref 39.0–52.0)
Hemoglobin: 9.8 g/dL — ABNORMAL LOW (ref 13.0–17.0)

## 2018-05-09 MED ORDER — PANTOPRAZOLE SODIUM 40 MG PO TBEC: 40.0000 mg | DELAYED_RELEASE_TABLET | Freq: Two times a day (BID) | ORAL | 0 refills | Status: DC

## 2018-05-09 NOTE — Progress Notes (Signed)
  Subjective:  Patient has no complaints.  He has not experienced nausea vomiting abdominal pain or melena since EGD yesterday.  He denies history of daily or frequent heartburn.  He says he would sometimes experience at night and would take Tums for relief.  He ate all of his breakfast this morning.  He is hoping to go home today.  Objective: Blood pressure 129/65, pulse 68, temperature 97.6 F (36.4 C), temperature source Oral, resp. rate 20, height '6\' 3"'$  (1.905 m), weight 136.1 kg, SpO2 95 %. Patient is alert and in no acute distress. He has hearing impairment. Abdomen is protuberant but soft and nontender with organomegaly or masses. No LE edema noted.  Labs/studies Results:  CBC Latest Ref Rng & Units 05/09/2018 05/08/2018 05/07/2018  WBC 4.0 - 10.5 K/uL 4.2 5.0 10.2  Hemoglobin 13.0 - 17.0 g/dL 9.2(L) 10.1(L) 12.2(L)  Hematocrit 39.0 - 52.0 % 33.0(L) 35.5(L) 43.3  Platelets 150 - 400 K/uL 176 197 279    CMP Latest Ref Rng & Units 05/09/2018 05/08/2018 05/07/2018  Glucose 70 - 99 mg/dL 109(H) 118(H) 144(H)  BUN 8 - 23 mg/dL 24(H) 31(H) 31(H)  Creatinine 0.61 - 1.24 mg/dL 1.02 1.17 1.33(H)  Sodium 135 - 145 mmol/L 137 138 139  Potassium 3.5 - 5.1 mmol/L 4.0 3.9 4.1  Chloride 98 - 111 mmol/L 108 108 103  CO2 22 - 32 mmol/L '23 25 26  '$ Calcium 8.9 - 10.3 mg/dL 7.5(L) 7.7(L) 8.9  Total Protein 6.5 - 8.1 g/dL - 6.3(L) 8.2(H)  Total Bilirubin 0.3 - 1.2 mg/dL - 0.6 0.9  Alkaline Phos 38 - 126 U/L - 48 65  AST 15 - 41 U/L - 25 30  ALT 0 - 44 U/L - 24 30    Hepatic Function Latest Ref Rng & Units 05/08/2018 05/07/2018 12/06/2014  Total Protein 6.5 - 8.1 g/dL 6.3(L) 8.2(H) 7.6  Albumin 3.5 - 5.0 g/dL 3.3(L) 4.2 3.8  AST 15 - 41 U/L 25 30 45(H)  ALT 0 - 44 U/L 24 30 38  Alk Phosphatase 38 - 126 U/L 48 65 67  Total Bilirubin 0.3 - 1.2 mg/dL 0.6 0.9 0.5      Assessment:  #1.  Upper GI bleed secondary to erosive/ulcerative reflux esophagitis involving distal 6 cm of esophageal mucosa.  No evidence  of active bleeds.  Hemoglobin is dropped by 3 g post hydration.  Need to document that H&H has leveled before he goes home.  Elevated BUN secondary to upper GI bleed.  BUN is trending downwards.  #2.  Gastritis possibly secondary to NSAID use.  H. pylori gastritis needs to be ruled out.  Recommendations:  H. pylori serology. H&H at 11:30 AM. If hemoglobin remains stable he should be able to go home today. He should continue double dose PPI for 12 weeks and thereafter once daily. Patient advised to keep NSAID use to minimum or use it on as-needed basis rather than daily.

## 2018-05-09 NOTE — Progress Notes (Signed)
Patient discharged home today per MD orders. Patient vital signs WDL. IV removed and site WDL. Discharge Instructions including follow up appointments, medications, and education reviewed with patient. Patient verbalizes understanding. Patient is transported out via wheelchair.  

## 2018-05-09 NOTE — Progress Notes (Signed)
Patient has finished his lunch.  He has no complaints. Does not feel his abdomen is distended. He is passing flatus. Hemoglobin is 9.8. Patient stable for discharge from GI standpoint. I will contact patient with results of H. Pylori.

## 2018-05-09 NOTE — Discharge Summary (Signed)
Discharge Summary  Angel Norman [facility] GHW:[id number] DOB: [birth year]  PCP: Lupita Raider, MD  Admit date: 05/07/2018 Discharge date: 05/09/2018  Time spent: 30 minutes  Recommendations for Outpatient Follow-up:  1. Primary care provider 2. Gastroenterologist  Discharge Diagnoses:  Active Hospital Problems   Diagnosis Date Noted  . AKI (acute kidney injury) (HCC) 05/07/2018  . UGIB (upper gastrointestinal bleed) 05/09/2018  . Anemia due to acute blood loss 05/09/2018  . Obesity, Class III, BMI 40-49.9 (morbid obesity) (HCC) 05/09/2018  . Acute lower UTI 05/09/2018  . GERD (gastroesophageal reflux disease) 05/08/2018  . Ileus (HCC) 12/07/2014    Resolved Hospital Problems  No resolved problems to display.    Discharge Condition: Improved  Diet recommendation: Reduced calorie diet  Vitals:   05/08/18 2105 05/09/18 0159  BP: 112/66 129/65  Pulse: 64 68  Resp: 18 20  Temp: 98.3 F (36.8 C) 97.6 F (36.4 C)  SpO2: 94% 95%    History of present illness:  This is an 85 year old male with a history of GERD, hyperlipidemia and hypertension who was admitted for diarrhea abdominal pain vomiting of dark vomitus he was found to have positive fecal occult blood fecal occult stool blood test And acute kidney injury and urinary tract infection.  Abdominal x-ray showed possible small bowel obstruction.  He was started on Rocephin for his urinary tract infection.  Hospital Course:  Principal Problem:   AKI (acute kidney injury) (HCC) Active Problems:   Ileus (HCC)   GERD (gastroesophageal reflux disease)   UGIB (upper gastrointestinal bleed)   Anemia due to acute blood loss   Obesity, Class III, BMI 40-49.9 (morbid obesity) (HCC)   Acute lower UTI  Patient was admitted for acute kidney injury was found to have positive Hemoccult with a total of three-point drop of his hemoglobin.  Initially his hemoglobin dropped by 2 points and GI was consulted who proceeded to do his EGD EGD showed  Upper GI bleed secondary to erosive/ulcerative reflux esophagitis involving distal 6 cm of esophageal mucosa.  No evidence of active bleeds.    Gastritis.  H. pylori serology was obtained still pending.  GI recommended for him to be on double PPI for 12 weeks thereafter daily and he is to avoid NSAIDs daily use of NSAIDs.  He had a total of three-point drop in his hemoglobin and recheck of hemoglobin this afternoon showed increase in the hemoglobin and GI felt that it was stable enough to be discharged home.  His BUN was also elevated most likely from GI bleed but that has normalized.  He was able to be advanced to his regular diet with no problem denies any more abdominal pain he is making good bowel movement  Procedures:  EGD, May 08, 2018  Consultations:  Gastroenterology,Rehman, Joline Maxcy, MD  Discharge Exam: BP 129/65 (BP Location: Left Arm)   Pulse 68   Temp 97.6 F (36.4 C) (Oral)   Resp 20   Ht 6\' 3"  (1.905 m)   Wt 136.1 kg   SpO2 95%   BMI 37.50 kg/m   General: Alert oriented x3 obese not in distress Cardiovascular: Regular rate and rhythm Respiratory: Clear to auscultation bilaterally with no wheezing no rhonchi  Discharge Instructions You were cared for by a hospitalist during your hospital stay. If you have any questions about your discharge medications or the care you received while you were in the hospital after you are discharged, you can call the unit and asked to speak with the hospitalist on  call if the hospitalist that took care of you is not available. Once you are discharged, your primary care physician will handle any further medical issues. Please note that NO REFILLS for any discharge medications will be authorized once you are discharged, as it is imperative that you return to your primary care physician (or establish a relationship with a primary care physician if you do not have one) for your aftercare needs so that they can reassess your need for medications and  monitor your lab values.  Discharge Instructions    Call MD for:  persistant nausea and vomiting   Complete by:  As directed    Diet - low sodium heart healthy   Complete by:  As directed    Discharge instructions   Complete by:  As directed    Follow-up with gastroenterologist for H. pylori result He should continue double dose PPI for 12 weeks and thereafter once daily. Patient advised to keep NSAID use to minimum or use it on as-needed basis rather than daily.    Increase activity slowly   Complete by:  As directed      Allergies as of 05/09/2018   No Known Allergies     Medication List    TAKE these medications   pantoprazole 40 MG tablet Commonly known as:  Protonix Take 1 tablet (40 mg total) by mouth 2 (two) times daily. Needs to use twice a day for 12 weeks for gastritis      No Known Allergies    The results of significant diagnostics from this hospitalization (including imaging, microbiology, ancillary and laboratory) are listed below for reference.    Significant Diagnostic Studies: Dg Abd Acute 2+v W 1v Chest  Result Date: 05/07/2018 CLINICAL DATA:  Vomiting EXAM: DG ABDOMEN ACUTE W/ 1V CHEST COMPARISON:  12/12/2014, 12/08/2014, 12/06/2014 FINDINGS: Single-view chest demonstrates left-sided pleural thickening and bilateral calcified pleural plaques. Bilateral interstitial opacity. No focal consolidation. Stable cardiomediastinal silhouette. 9 mm possible lung nodule at the right CP angle. Supine and upright views of the abdomen demonstrate no free air beneath the diaphragm. Multiple loops of dilated small bowel within the central abdomen measuring up to 4.8 cm with multiple fluid levels. Paucity of distal bowel gas. IMPRESSION: 1. Prominent interstitial opacity could be secondary to interstitial inflammatory process. Bilateral calcified pleural plaques and left pleural thickening, chronic. Possible 9 mm right CP angle lung nodule. Chest CT follow-up may be obtained  for further evaluation. 2. Findings consistent with mechanical small bowel obstruction. No free air. Electronically Signed   By: Jasmine Pang M.D.   On: 05/07/2018 18:47    Microbiology: Recent Results (from the past 240 hour(s))  Urine culture     Status: None (Preliminary result)   Collection Time: 05/07/18  6:30 PM  Result Value Ref Range Status   Specimen Description   Final    URINE, RANDOM Performed at [facility], [address] [place], [place] [address]    Special Requests   Final    NONE Performed at [facility], [address] [place], [place] [address]    Culture   Final    CULTURE REINCUBATED FOR BETTER GROWTH Performed at [facility], [address], [place], [place] [address]    Report Status PENDING  Incomplete     Labs: Basic Metabolic Panel: Recent Labs  Lab 05/07/18 1532 05/08/18 0532 05/09/18 0630  NA 139 138 137  K 4.1 3.9 4.0  CL 103 108 108  CO2 26  25 23  GLUCOSE 144* 118* 109*  BUN 31* 31* 24*  CREATININE 1.33* 1.17 1.02  CALCIUM 8.9 7.7* 7.5*   Liver Function Tests: Recent Labs  Lab 05/07/18 1532 05/08/18 0532  AST 30 25  ALT 30 24  ALKPHOS 65 48  BILITOT 0.9 0.6  PROT 8.2* 6.3*  ALBUMIN 4.2 3.3*   Recent Labs  Lab 05/07/18 1532  LIPASE 26   No results for input(s): AMMONIA in the last 168 hours. CBC: Recent Labs  Lab 05/07/18 1532 05/08/18 0532 05/09/18 0630 05/09/18 1144  WBC 10.2 5.0 4.2  --   NEUTROABS  --   --  2.6  --   HGB 12.2* 10.1* 9.2* 9.8*  HCT 43.3 35.5* 33.0* 35.2*  MCV 82.8 83.9 86.2  --   PLT 279 197 176  --    Cardiac Enzymes: No results for input(s): CKTOTAL, CKMB, CKMBINDEX, TROPONINI in the last 168 hours. BNP: BNP (last 3 results) No results for input(s): BNP in the last 8760 hours.  ProBNP (last 3 results) No results for input(s): PROBNP in the last 8760 hours.  CBG: No results for input(s): GLUCAP in the last 168 hours.     Signed:  Myrtie Neither, MD [facility] 05/09/2018, 12:46 PM

## 2018-05-10 LAB — URINE CULTURE

## 2018-05-11 LAB — H. PYLORI ANTIBODY, IGG: H Pylori IgG: 0.51 Index Value (ref 0.00–0.79)

## 2018-05-12 ENCOUNTER — Encounter (HOSPITAL_COMMUNITY): Payer: Self-pay | Admitting: Internal Medicine

## 2018-05-21 ENCOUNTER — Telehealth (INDEPENDENT_AMBULATORY_CARE_PROVIDER_SITE_OTHER): Payer: Self-pay | Admitting: Internal Medicine

## 2018-05-21 NOTE — Telephone Encounter (Signed)
Daughter called wanted to know if Dr Karilyn Cota wanted to see patient for a follow up -  Ph# [phone]

## 2018-05-22 NOTE — Telephone Encounter (Signed)
Talked with the patient's daughter, [name]. Her father had a procedure the first part of March. She is wanting to know if he will need to have a follow up visit with  Dr.Rehman. States that PCP told her he needed to see Gastroenterologist.   Advised her that we would address this on Monday , and I would call her back.

## 2018-05-25 NOTE — Telephone Encounter (Signed)
Per Dr.Rehman the patient will need a OV with him for visit post procedure. He will need H&H prior to this visit.  Please let me know when this appointment is made , so that the lab can be ordered. Also , call his daughter with the appointment.

## 2018-06-01 ENCOUNTER — Ambulatory Visit (INDEPENDENT_AMBULATORY_CARE_PROVIDER_SITE_OTHER): Payer: 59 | Admitting: Internal Medicine

## 2018-06-01 ENCOUNTER — Encounter (INDEPENDENT_AMBULATORY_CARE_PROVIDER_SITE_OTHER): Payer: Self-pay | Admitting: Internal Medicine

## 2018-06-01 DIAGNOSIS — K2100 Gastro-esophageal reflux disease with esophagitis, without bleeding: Secondary | ICD-10-CM

## 2018-06-01 DIAGNOSIS — D649 Anemia, unspecified: Secondary | ICD-10-CM | POA: Diagnosis not present

## 2018-06-01 NOTE — Progress Notes (Addendum)
Virtual Visit via Telephone Note Patient was recently evaluated during hospitalization for GI bleed and found to have grade C esophagitis.  Because of COVID-19 pandemic patient and his daughter requested that visit would be done via telephone with me. I connected with Thong Raspa [facility] on 06/01/18 at 11:45 AM EDT by telephone and verified that I am speaking with the correct person using two identifiers.   I discussed the limitations, risks, security and privacy concerns of performing an evaluation and management service by telephone and the availability of in person appointments. I also discussed with the patient that there may be a patient responsible charge related to this service. The patient expressed understanding and agreed to proceed with telephone visit, he is unable to do video visit. Mr Curtsinger is at home and I am in the office. Larose Hires LPN is also present for telephone visit.   History of Present Illness:  Patient is 85 year old Caucasian male who was admitted to [facility] on 05/07/2022 melena.  His hemoglobin dropped from 12.2 g to 9.8 g.  His melena cleared.  He underwent esophagogastroduodenoscopy which revealed grade C reflux esophagitis as well as small sliding hiatal hernia and gastritis.  H. pylori serology was negative.  Gastritis was felt to be due to NSAID use. Patient was discharged on pantoprazole 40 mg twice daily and advised to keep NSAID use to minimum. Over the phone visit was accomplished with the help of his daughter [name].  They were on the speaker phone. Patient states he is feeling well.  He has not had heartburn regurgitation since he was discharged from the hospital.  He also denies dysphagia nausea vomiting or abdominal pain.  His bowels move daily.  Stools are formed and not black or red.  He has had an of the bed elevated by at least 6 inches.  He is not having any side effects with this medication.  Sometimes he takes second dose after evening meal and his daughter has to  remind him to take it before meals. He has not taken Naprosyn or meloxicam since recent hospitalization.   Observations/Objective:  Patient appears to be doing well.   Assessment and Plan:  #1. GI bleed secondary to grade C reflux esophagitis.  He is on double dose PPI and antireflux measures and doing well.  #2 anemia.  His hemoglobin dropped significantly during hospitalization.  He is not having overt GI bleed.  H&H will be checked when feasible.   Follow Up Instructions:  CBC in 2 weeks or thereabouts conditions permitting. Patient will continue pantoprazole at twice daily schedule for total of 12 weeks after which dose will be reduced to once daily. Once again patient reminded to use NSAID only when absolutely necessary. He can take Tylenol up to 2 g/day in divided dose on as-needed basis for musculoskeletal pain.    I discussed the assessment and treatment plan with the patient. The patient was provided an opportunity to ask questions and all were answered. The patient agreed with the plan and demonstrated an understanding of the instructions.   The patient was advised to call back or seek an in-person evaluation if the symptoms worsen or if the condition fails to improve as anticipated.  I provided 7 minutes of non-face-to-face time during this encounter.   Lionel December, MD

## 2018-06-01 NOTE — Patient Instructions (Signed)
CBC in 2 weeks. 

## 2018-06-03 ENCOUNTER — Encounter (INDEPENDENT_AMBULATORY_CARE_PROVIDER_SITE_OTHER): Payer: Self-pay | Admitting: *Deleted

## 2018-06-03 ENCOUNTER — Other Ambulatory Visit (INDEPENDENT_AMBULATORY_CARE_PROVIDER_SITE_OTHER): Payer: Self-pay | Admitting: *Deleted

## 2018-06-03 DIAGNOSIS — K2100 Gastro-esophageal reflux disease with esophagitis, without bleeding: Secondary | ICD-10-CM

## 2019-03-25 ENCOUNTER — Ambulatory Visit
Admission: RE | Admit: 2019-03-25 | Discharge: 2019-03-25 | Disposition: A | Discharge status: Home / Self Care | Payer: Medicare HMO | Source: Ambulatory Visit | Attending: Family Medicine | Admitting: Family Medicine

## 2019-03-25 ENCOUNTER — Other Ambulatory Visit: Payer: Self-pay | Admitting: Family Medicine

## 2019-03-25 DIAGNOSIS — T148XXA Other injury of unspecified body region, initial encounter: Secondary | ICD-10-CM

## 2019-03-25 IMAGING — DX DG FOOT COMPLETE 3+V*L*
3 series · 3 of 3 positions shown · non-contrast
Comparison: None.

CLINICAL DATA: Puncture wound to the left foot.

EXAM:
LEFT FOOT - COMPLETE 3+ VIEW

[dg foot complete left (1 of 3)]
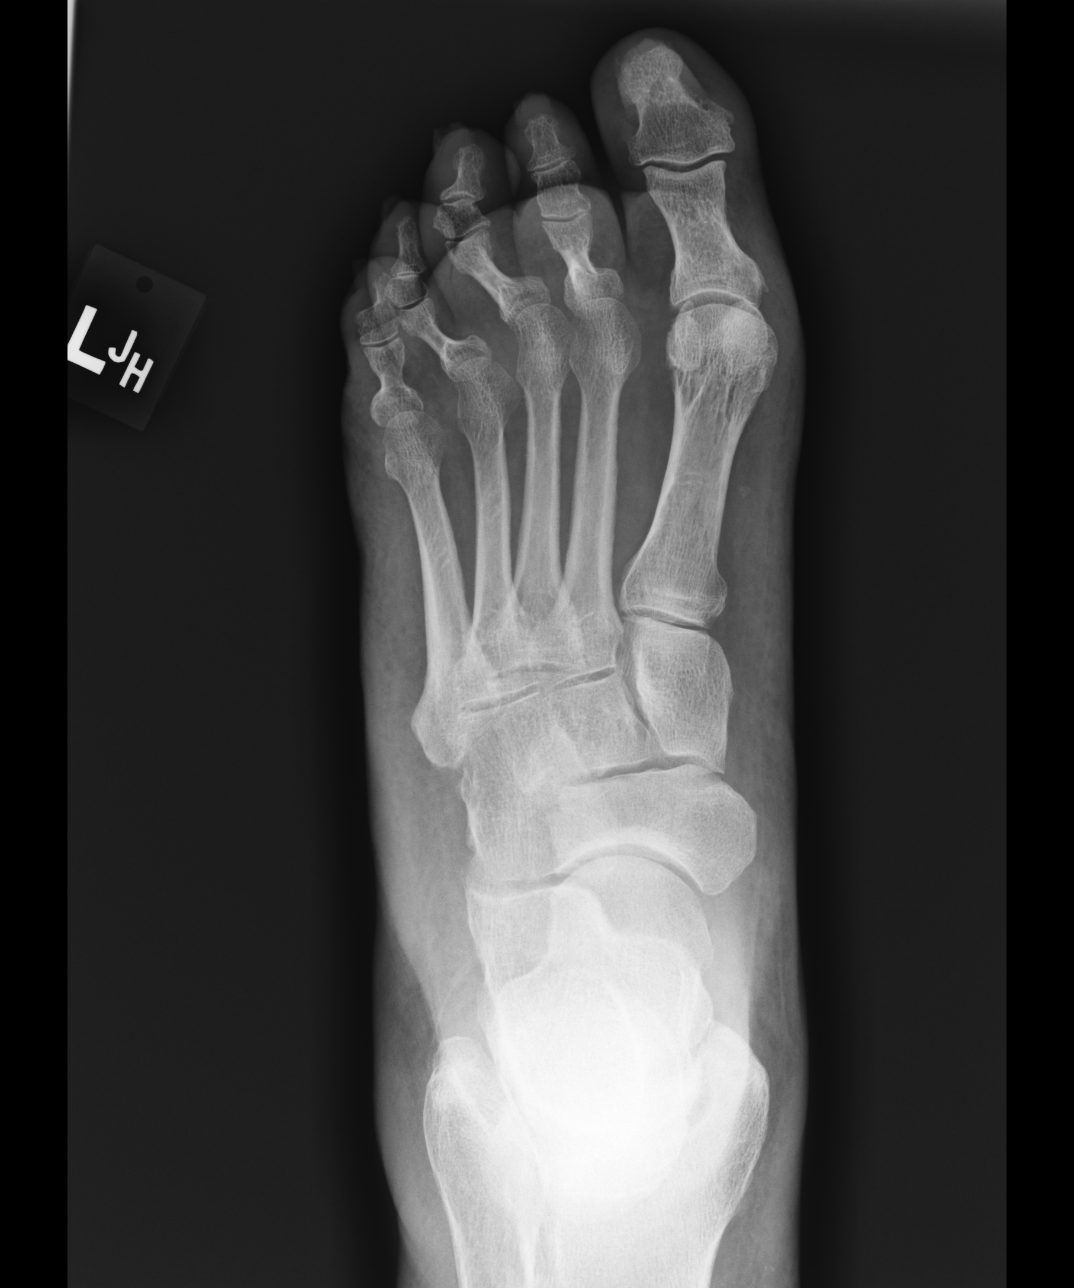

[dg foot complete left (2 of 3)]
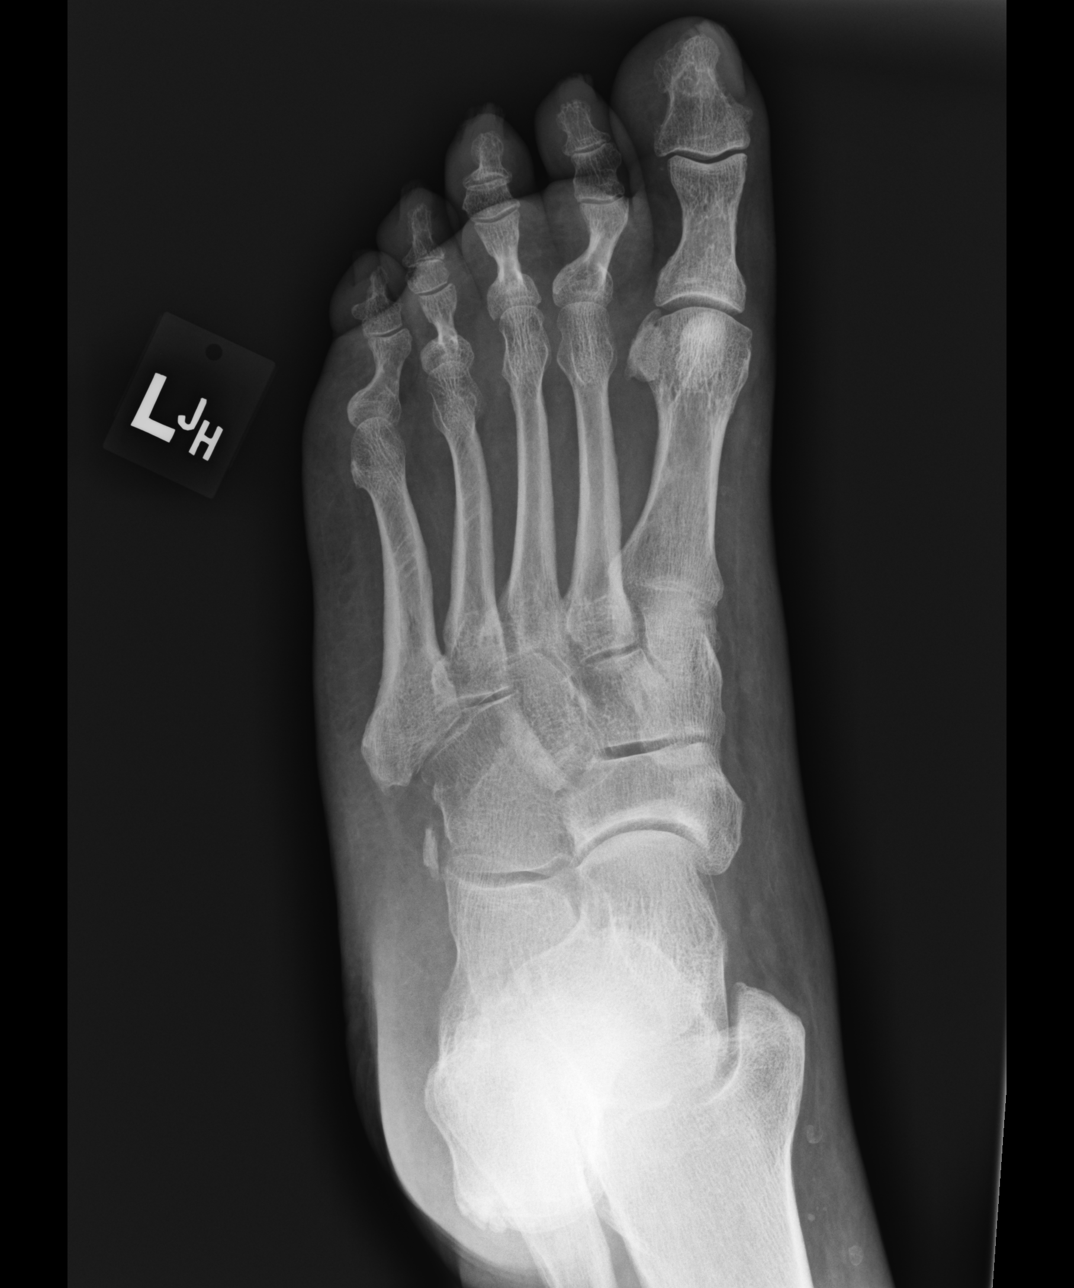

[dg foot complete left (3 of 3)]
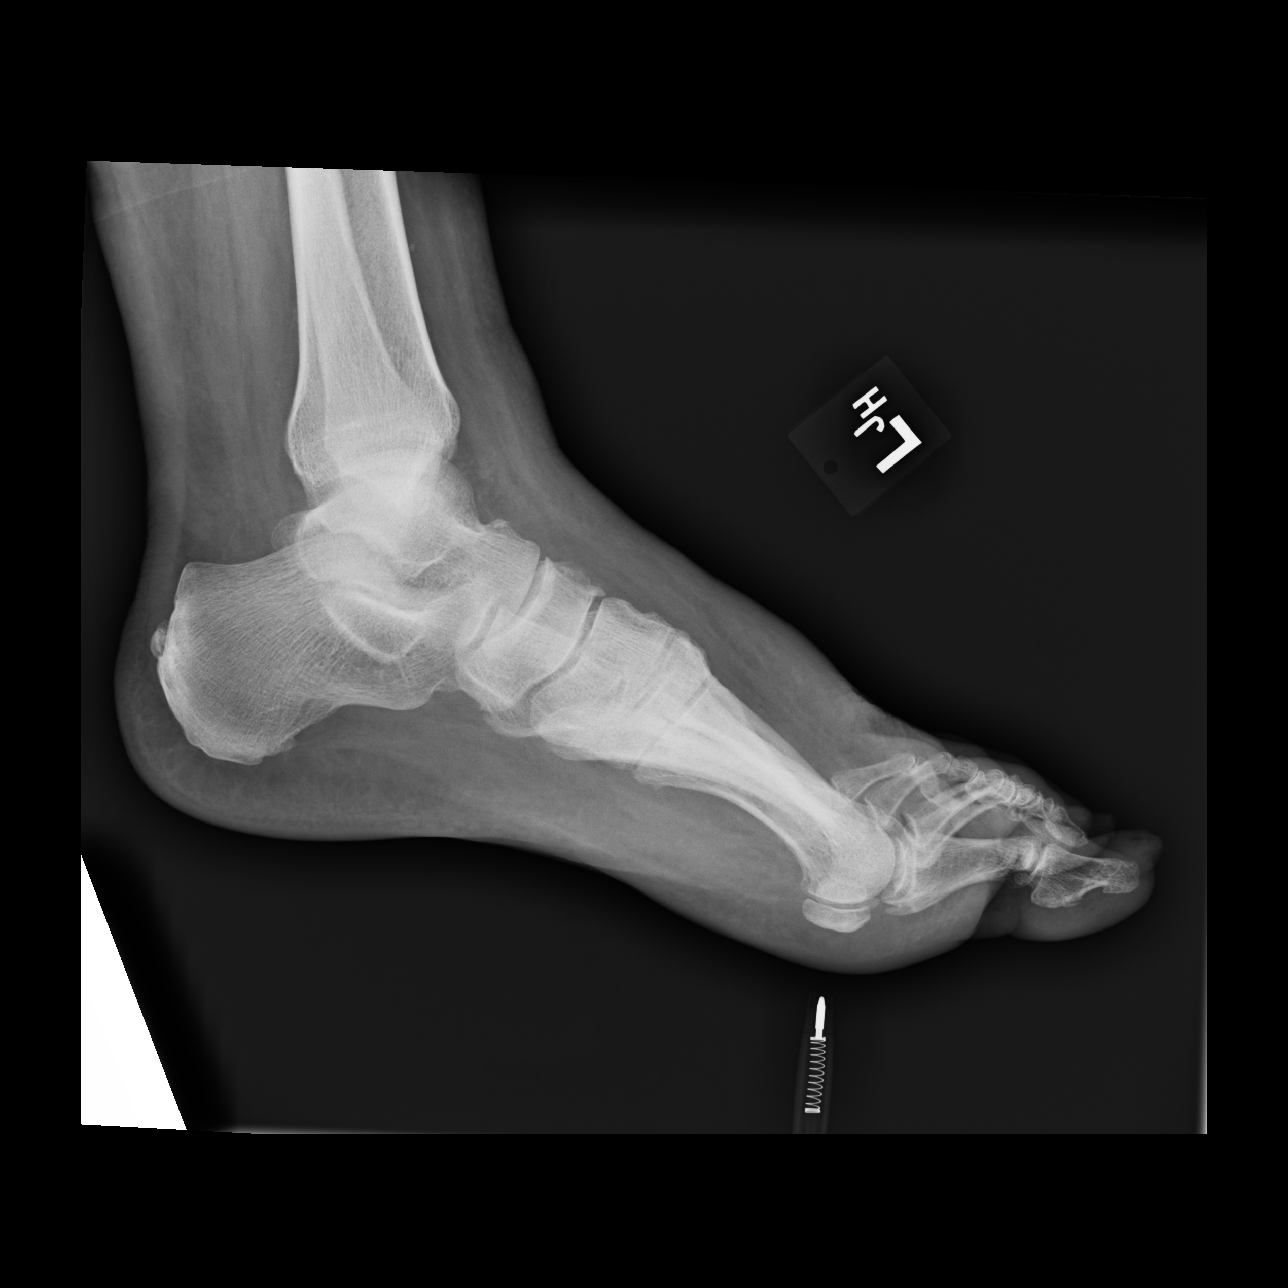

[3 of 3 positions shown; findings below may reference images not displayed]

FINDINGS: Normal talus, calcaneus, and tarsal bones.
Mild degenerative changes seen involving the visualized subtalar,
talonavicular, calcaneocuboid, tarsal and tarsometatarsal
articulations.
Normal metatarsi.
Normal metatarsophalangeal joint of the left great toe. Normal
tibial and fibular sesamoid bones. Normal interphalangeal joint of
the left great toe. Normal phalanges of the left great toe.
Normal second through fifth metatarsophalangeal joints. Normal
interphalangeal joints of the lesser toes. Normal phalanges of the
lesser toes.

Mild to moderate severity dorsal soft tissue swelling is seen.
IMPRESSION: 1. Degenerative changes without evidence of acute osseous
abnormality.

## 2019-05-04 ENCOUNTER — Ambulatory Visit (INDEPENDENT_AMBULATORY_CARE_PROVIDER_SITE_OTHER): Payer: Medicare PPO | Admitting: Family

## 2019-05-04 ENCOUNTER — Encounter: Payer: Self-pay | Admitting: Family

## 2019-05-04 ENCOUNTER — Other Ambulatory Visit: Payer: Self-pay

## 2019-05-04 VITALS — Ht 75.0 in | Wt 300.0 lb

## 2019-05-04 DIAGNOSIS — B351 Tinea unguium: Secondary | ICD-10-CM

## 2019-05-04 DIAGNOSIS — I872 Venous insufficiency (chronic) (peripheral): Secondary | ICD-10-CM

## 2019-05-04 DIAGNOSIS — L03116 Cellulitis of left lower limb: Secondary | ICD-10-CM | POA: Diagnosis not present

## 2019-05-04 DIAGNOSIS — S91332A Puncture wound without foreign body, left foot, initial encounter: Secondary | ICD-10-CM | POA: Diagnosis not present

## 2019-05-04 NOTE — Progress Notes (Signed)
Office Visit Note   Patient: Angel Norman           Date of Birth: 08-20-1932           MRN: [id number] Visit Date: 05/04/2019              Requested by: Lupita Raider, MD [address] [place],  [place] [address] PCP: Lupita Raider, MD  Chief Complaint  Patient presents with  . Left Foot - Edema      HPI: Patient is an 86 year old gentleman who was seen for initial evaluation for both lower extremities. Patient complains of redness and swelling in both lower extremities. He states he stepped on a nail on the left forefoot about a month ago. Patient states he did receive a tetanus shot. Patient denies any pain.  Assessment & Plan: Visit Diagnoses:  1. Venous stasis dermatitis of both lower extremities   2. Nail wound of left foot, initial encounter     Plan: Patient's symptoms seem to be primarily from his venous insufficiency there is no signs of infection in his foot. Patient is given a prescription for size extra-large knee-high compression stockings 15 to 20 mm compression.  Follow-Up Instructions: Return in about 3 months (around 08/04/2019).   Ortho Exam  Patient is alert, oriented, no adenopathy, well-dressed, normal affect, normal respiratory effort. Examination patient has venous stasis changes with brawny edema on both lower extremities. He has a palpable dorsalis pedis and posterior tibial pulse. He has no redness or cellulitis there is no tenderness to palpation in his foot. With elevation the redness resolves in both lower extremities. Patient's calf measures 43 cm in circumference bilaterally. He has pitting edema to the tibial tubercle. Patient has thickened discolored onychomycotic nails x10 after informed consent the nails were trimmed x10 without complications. No clinical signs of infection from the penetrating nail trauma.  Imaging: No results found. No images are attached to the encounter.  Labs: Lab Results  Component Value Date   REPTSTATUS 05/10/2018 FINAL 05/07/2018   CULT  05/07/2018    Multiple bacterial morphotypes present, none predominant. Suggest appropriate recollection if clinically indicated.     Lab Results  Component Value Date   ALBUMIN 3.3 (L) 05/08/2018   ALBUMIN 4.2 05/07/2018   ALBUMIN 3.8 12/06/2014    Lab Results  Component Value Date   MG 2.3 12/11/2014   No results found for: VD25OH  No results found for: PREALBUMIN CBC EXTENDED Latest Ref Rng & Units 05/09/2018 05/09/2018 05/08/2018  WBC 4.0 - 10.5 K/uL - 4.2 5.0  RBC 4.22 - 5.81 MIL/uL - 3.83(L) 4.23  HGB 13.0 - 17.0 g/dL 0.1(U) 9.3(A) 10.1(L)  HCT 39.0 - 52.0 % 35.2(L) 33.0(L) 35.5(L)  PLT 150 - 400 K/uL - 176 197  NEUTROABS 1.7 - 7.7 K/uL - 2.6 -  LYMPHSABS 0.7 - 4.0 K/uL - 0.9 -     Body mass index is 37.5 kg/m.  Orders:  No orders of the defined types were placed in this encounter.  No orders of the defined types were placed in this encounter.    Procedures: No procedures performed  Clinical Data: No additional findings.  ROS:  All other systems negative, except as noted in the HPI. Review of Systems  Objective: Vital Signs: Ht 6\' 3"  (1.905 m)   Wt 300 lb (136.1 kg)   BMI 37.50 kg/m   Specialty Comments:  No specialty comments available.  PMFS History: Patient Active Problem List  Diagnosis Date Noted  . UGIB (upper gastrointestinal bleed) 05/09/2018  . Anemia due to acute blood loss 05/09/2018  . Obesity, Class III, BMI 40-49.9 (morbid obesity) ([place]) 05/09/2018  . Acute lower UTI 05/09/2018  . GERD (gastroesophageal reflux disease) 05/08/2018  . AKI (acute kidney injury) ([place]) 05/07/2018  . Blunt chest trauma 12/07/2014  . Ileus ([place]) 12/07/2014  . Fracture of multiple ribs of left side 12/06/2014   Past Medical History:  Diagnosis Date  . Arthritis   . GERD (gastroesophageal reflux disease)   . Hyperlipidemia   . Rib fractures 12/06/2014    History reviewed. No pertinent family history.    Past Surgical History:  Procedure Laterality Date  . ESOPHAGOGASTRODUODENOSCOPY (EGD) WITH PROPOFOL N/A 05/08/2018   Procedure: ESOPHAGOGASTRODUODENOSCOPY (EGD) WITH PROPOFOL;  Surgeon: Rogene Houston, MD;  Location: [facility];  Service: Endoscopy;  Laterality: N/A;  . goiter removal    . TOTAL KNEE ARTHROPLASTY Left 08/18/2012   Dr Sharol Given  . TOTAL KNEE ARTHROPLASTY Left 08/19/2012   Procedure: TOTAL KNEE ARTHROPLASTY;  Surgeon: Newt Minion, MD;  Location: [place];  Service: Orthopedics;  Laterality: Left;  Left Total Knee Arthroplasty   Social History   Occupational History  . Not on file  Tobacco Use  . Smoking status: Never Smoker  . Smokeless tobacco: Never Used  Substance and Sexual Activity  . Alcohol use: No  . Drug use: No  . Sexual activity: Not on file

## 2019-06-02 DIAGNOSIS — H35362 Drusen (degenerative) of macula, left eye: Secondary | ICD-10-CM | POA: Diagnosis not present

## 2019-06-02 DIAGNOSIS — H26491 Other secondary cataract, right eye: Secondary | ICD-10-CM | POA: Diagnosis not present

## 2019-06-02 DIAGNOSIS — Z961 Presence of intraocular lens: Secondary | ICD-10-CM | POA: Diagnosis not present

## 2019-06-10 DIAGNOSIS — H35013 Changes in retinal vascular appearance, bilateral: Secondary | ICD-10-CM | POA: Diagnosis not present

## 2019-06-10 DIAGNOSIS — H26491 Other secondary cataract, right eye: Secondary | ICD-10-CM | POA: Diagnosis not present

## 2019-06-10 DIAGNOSIS — H35033 Hypertensive retinopathy, bilateral: Secondary | ICD-10-CM | POA: Diagnosis not present

## 2019-06-10 DIAGNOSIS — H35362 Drusen (degenerative) of macula, left eye: Secondary | ICD-10-CM | POA: Diagnosis not present

## 2019-07-13 DIAGNOSIS — E782 Mixed hyperlipidemia: Secondary | ICD-10-CM | POA: Diagnosis not present

## 2019-07-13 DIAGNOSIS — E1169 Type 2 diabetes mellitus with other specified complication: Secondary | ICD-10-CM | POA: Diagnosis not present

## 2019-07-13 DIAGNOSIS — N400 Enlarged prostate without lower urinary tract symptoms: Secondary | ICD-10-CM | POA: Diagnosis not present

## 2019-07-13 DIAGNOSIS — M1990 Unspecified osteoarthritis, unspecified site: Secondary | ICD-10-CM | POA: Diagnosis not present

## 2019-07-13 DIAGNOSIS — K219 Gastro-esophageal reflux disease without esophagitis: Secondary | ICD-10-CM | POA: Diagnosis not present

## 2019-07-13 DIAGNOSIS — Z0000 Encounter for general adult medical examination without abnormal findings: Secondary | ICD-10-CM | POA: Diagnosis not present

## 2020-01-14 DIAGNOSIS — E782 Mixed hyperlipidemia: Secondary | ICD-10-CM | POA: Diagnosis not present

## 2020-01-14 DIAGNOSIS — E1169 Type 2 diabetes mellitus with other specified complication: Secondary | ICD-10-CM | POA: Diagnosis not present

## 2020-01-14 DIAGNOSIS — Z23 Encounter for immunization: Secondary | ICD-10-CM | POA: Diagnosis not present

## 2020-04-25 DIAGNOSIS — E1169 Type 2 diabetes mellitus with other specified complication: Secondary | ICD-10-CM | POA: Diagnosis not present

## 2020-06-23 DIAGNOSIS — Z20822 Contact with and (suspected) exposure to covid-19: Secondary | ICD-10-CM | POA: Diagnosis not present

## 2020-07-11 DIAGNOSIS — E1169 Type 2 diabetes mellitus with other specified complication: Secondary | ICD-10-CM | POA: Diagnosis not present

## 2020-07-11 DIAGNOSIS — Z0000 Encounter for general adult medical examination without abnormal findings: Secondary | ICD-10-CM | POA: Diagnosis not present

## 2020-07-11 DIAGNOSIS — E782 Mixed hyperlipidemia: Secondary | ICD-10-CM | POA: Diagnosis not present

## 2020-07-11 DIAGNOSIS — N400 Enlarged prostate without lower urinary tract symptoms: Secondary | ICD-10-CM | POA: Diagnosis not present

## 2020-07-11 DIAGNOSIS — M1990 Unspecified osteoarthritis, unspecified site: Secondary | ICD-10-CM | POA: Diagnosis not present

## 2020-07-11 DIAGNOSIS — K219 Gastro-esophageal reflux disease without esophagitis: Secondary | ICD-10-CM | POA: Diagnosis not present

## 2020-09-11 ENCOUNTER — Inpatient Hospital Stay (HOSPITAL_COMMUNITY)
Admission: EM | Admit: 2020-09-11 | Discharge: 2020-09-18 | DRG: 492 | Payer: Medicare PPO | Attending: Internal Medicine | Admitting: Internal Medicine

## 2020-09-11 ENCOUNTER — Encounter (HOSPITAL_COMMUNITY): Payer: Self-pay | Admitting: Emergency Medicine

## 2020-09-11 ENCOUNTER — Other Ambulatory Visit: Payer: Self-pay

## 2020-09-11 ENCOUNTER — Emergency Department (HOSPITAL_COMMUNITY): Payer: Medicare PPO

## 2020-09-11 DIAGNOSIS — G8918 Other acute postprocedural pain: Secondary | ICD-10-CM | POA: Diagnosis not present

## 2020-09-11 DIAGNOSIS — Z6839 Body mass index (BMI) 39.0-39.9, adult: Secondary | ICD-10-CM

## 2020-09-11 DIAGNOSIS — Z79899 Other long term (current) drug therapy: Secondary | ICD-10-CM | POA: Diagnosis not present

## 2020-09-11 DIAGNOSIS — J9601 Acute respiratory failure with hypoxia: Secondary | ICD-10-CM | POA: Diagnosis present

## 2020-09-11 DIAGNOSIS — Z23 Encounter for immunization: Secondary | ICD-10-CM | POA: Diagnosis present

## 2020-09-11 DIAGNOSIS — N179 Acute kidney failure, unspecified: Secondary | ICD-10-CM | POA: Diagnosis present

## 2020-09-11 DIAGNOSIS — Z77090 Contact with and (suspected) exposure to asbestos: Secondary | ICD-10-CM | POA: Diagnosis present

## 2020-09-11 DIAGNOSIS — R52 Pain, unspecified: Secondary | ICD-10-CM | POA: Diagnosis present

## 2020-09-11 DIAGNOSIS — S8292XD Unspecified fracture of left lower leg, subsequent encounter for closed fracture with routine healing: Secondary | ICD-10-CM | POA: Diagnosis not present

## 2020-09-11 DIAGNOSIS — I447 Left bundle-branch block, unspecified: Secondary | ICD-10-CM | POA: Diagnosis present

## 2020-09-11 DIAGNOSIS — S82832A Other fracture of upper and lower end of left fibula, initial encounter for closed fracture: Secondary | ICD-10-CM | POA: Diagnosis present

## 2020-09-11 DIAGNOSIS — R112 Nausea with vomiting, unspecified: Secondary | ICD-10-CM | POA: Diagnosis not present

## 2020-09-11 DIAGNOSIS — W010XXA Fall on same level from slipping, tripping and stumbling without subsequent striking against object, initial encounter: Secondary | ICD-10-CM | POA: Diagnosis present

## 2020-09-11 DIAGNOSIS — Z7984 Long term (current) use of oral hypoglycemic drugs: Secondary | ICD-10-CM | POA: Diagnosis not present

## 2020-09-11 DIAGNOSIS — D649 Anemia, unspecified: Secondary | ICD-10-CM | POA: Diagnosis not present

## 2020-09-11 DIAGNOSIS — Z4789 Encounter for other orthopedic aftercare: Secondary | ICD-10-CM | POA: Diagnosis not present

## 2020-09-11 DIAGNOSIS — M6281 Muscle weakness (generalized): Secondary | ICD-10-CM | POA: Diagnosis not present

## 2020-09-11 DIAGNOSIS — K219 Gastro-esophageal reflux disease without esophagitis: Secondary | ICD-10-CM | POA: Diagnosis present

## 2020-09-11 DIAGNOSIS — R1111 Vomiting without nausea: Secondary | ICD-10-CM | POA: Diagnosis not present

## 2020-09-11 DIAGNOSIS — R110 Nausea: Secondary | ICD-10-CM | POA: Diagnosis not present

## 2020-09-11 DIAGNOSIS — S8262XA Displaced fracture of lateral malleolus of left fibula, initial encounter for closed fracture: Secondary | ICD-10-CM | POA: Diagnosis present

## 2020-09-11 DIAGNOSIS — E669 Obesity, unspecified: Secondary | ICD-10-CM | POA: Diagnosis present

## 2020-09-11 DIAGNOSIS — R2681 Unsteadiness on feet: Secondary | ICD-10-CM | POA: Diagnosis not present

## 2020-09-11 DIAGNOSIS — I2510 Atherosclerotic heart disease of native coronary artery without angina pectoris: Secondary | ICD-10-CM | POA: Diagnosis present

## 2020-09-11 DIAGNOSIS — Y93K9 Activity, other involving animal care: Secondary | ICD-10-CM

## 2020-09-11 DIAGNOSIS — N183 Chronic kidney disease, stage 3 unspecified: Secondary | ICD-10-CM

## 2020-09-11 DIAGNOSIS — E1122 Type 2 diabetes mellitus with diabetic chronic kidney disease: Secondary | ICD-10-CM | POA: Diagnosis present

## 2020-09-11 DIAGNOSIS — J9811 Atelectasis: Secondary | ICD-10-CM | POA: Diagnosis present

## 2020-09-11 DIAGNOSIS — E785 Hyperlipidemia, unspecified: Secondary | ICD-10-CM | POA: Diagnosis present

## 2020-09-11 DIAGNOSIS — Z7401 Bed confinement status: Secondary | ICD-10-CM | POA: Diagnosis not present

## 2020-09-11 DIAGNOSIS — N400 Enlarged prostate without lower urinary tract symptoms: Secondary | ICD-10-CM | POA: Diagnosis present

## 2020-09-11 DIAGNOSIS — S82892D Other fracture of left lower leg, subsequent encounter for closed fracture with routine healing: Secondary | ICD-10-CM | POA: Diagnosis not present

## 2020-09-11 DIAGNOSIS — S8261XA Displaced fracture of lateral malleolus of right fibula, initial encounter for closed fracture: Secondary | ICD-10-CM | POA: Diagnosis not present

## 2020-09-11 DIAGNOSIS — S82842A Displaced bimalleolar fracture of left lower leg, initial encounter for closed fracture: Secondary | ICD-10-CM | POA: Diagnosis not present

## 2020-09-11 DIAGNOSIS — Z20822 Contact with and (suspected) exposure to covid-19: Secondary | ICD-10-CM | POA: Diagnosis present

## 2020-09-11 DIAGNOSIS — R279 Unspecified lack of coordination: Secondary | ICD-10-CM | POA: Diagnosis not present

## 2020-09-11 DIAGNOSIS — R0602 Shortness of breath: Secondary | ICD-10-CM | POA: Diagnosis not present

## 2020-09-11 DIAGNOSIS — M1990 Unspecified osteoarthritis, unspecified site: Secondary | ICD-10-CM | POA: Diagnosis present

## 2020-09-11 DIAGNOSIS — N1831 Chronic kidney disease, stage 3a: Secondary | ICD-10-CM | POA: Diagnosis present

## 2020-09-11 DIAGNOSIS — M25572 Pain in left ankle and joints of left foot: Secondary | ICD-10-CM | POA: Diagnosis not present

## 2020-09-11 DIAGNOSIS — R262 Difficulty in walking, not elsewhere classified: Secondary | ICD-10-CM | POA: Diagnosis not present

## 2020-09-11 DIAGNOSIS — S99912A Unspecified injury of left ankle, initial encounter: Secondary | ICD-10-CM | POA: Diagnosis not present

## 2020-09-11 DIAGNOSIS — S82892K Other fracture of left lower leg, subsequent encounter for closed fracture with nonunion: Secondary | ICD-10-CM | POA: Diagnosis not present

## 2020-09-11 DIAGNOSIS — E119 Type 2 diabetes mellitus without complications: Secondary | ICD-10-CM | POA: Diagnosis not present

## 2020-09-11 DIAGNOSIS — Z96652 Presence of left artificial knee joint: Secondary | ICD-10-CM | POA: Diagnosis present

## 2020-09-11 DIAGNOSIS — R0902 Hypoxemia: Secondary | ICD-10-CM | POA: Diagnosis not present

## 2020-09-11 DIAGNOSIS — W19XXXA Unspecified fall, initial encounter: Secondary | ICD-10-CM | POA: Diagnosis not present

## 2020-09-11 DIAGNOSIS — S82892A Other fracture of left lower leg, initial encounter for closed fracture: Secondary | ICD-10-CM

## 2020-09-11 LAB — CBG MONITORING, ED: Glucose-Capillary: 177 mg/dL — ABNORMAL HIGH (ref 70–99)

## 2020-09-11 IMAGING — DX DG ANKLE COMPLETE 3+V*L*
3 series · 3 of 3 positions shown · non-contrast
Comparison: None.

CLINICAL DATA: Trip and fall with left ankle pain, initial
encounter

EXAM:
LEFT ANKLE COMPLETE - 3+ VIEW

[ankle ap]
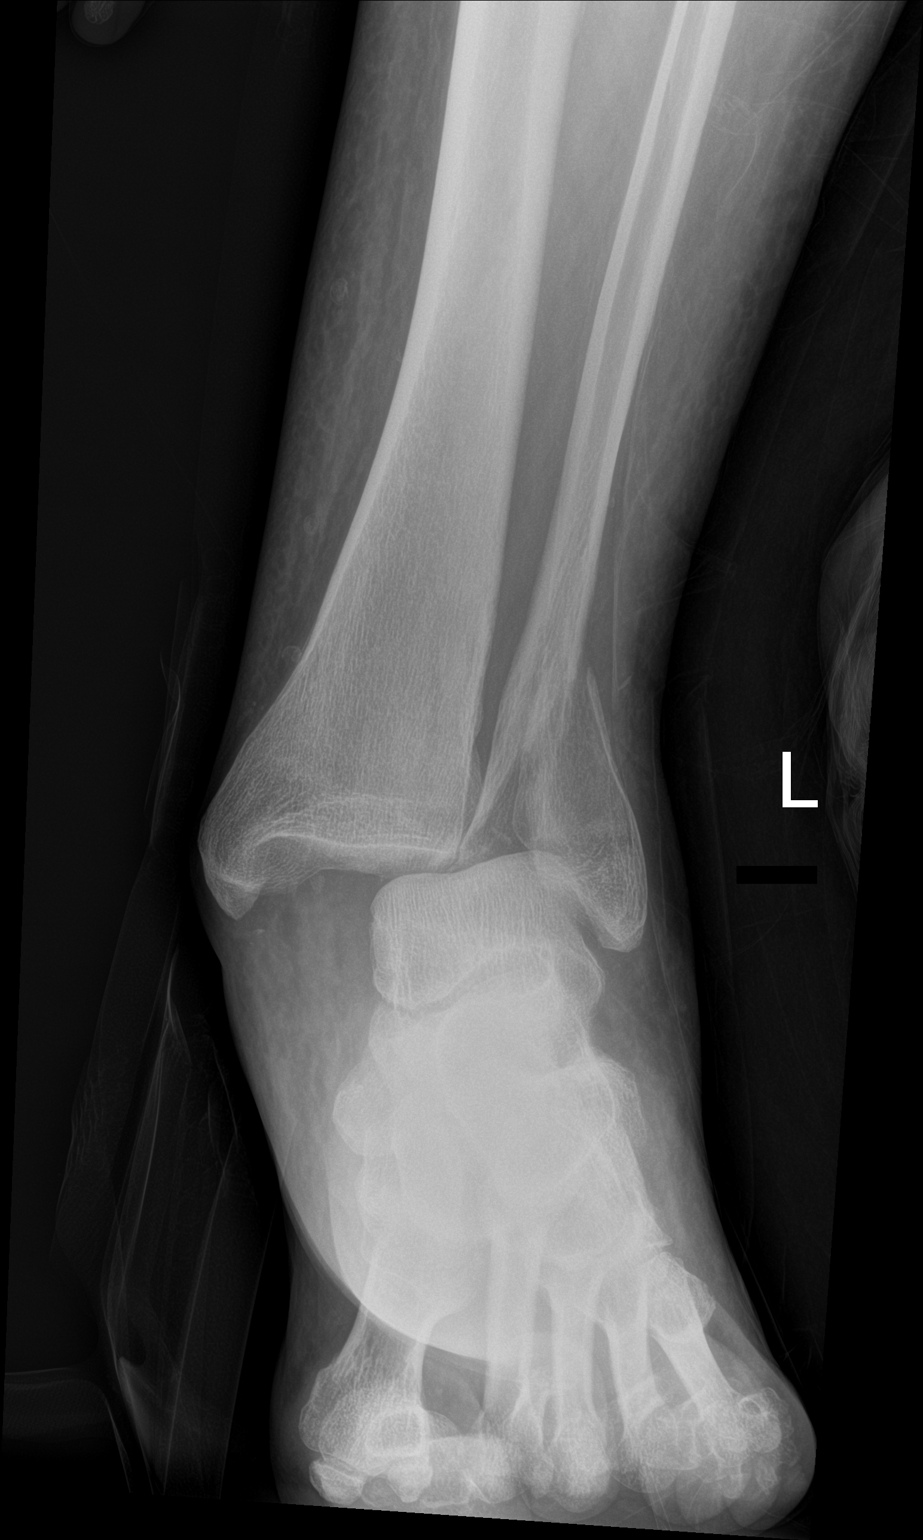

[ankle obl]
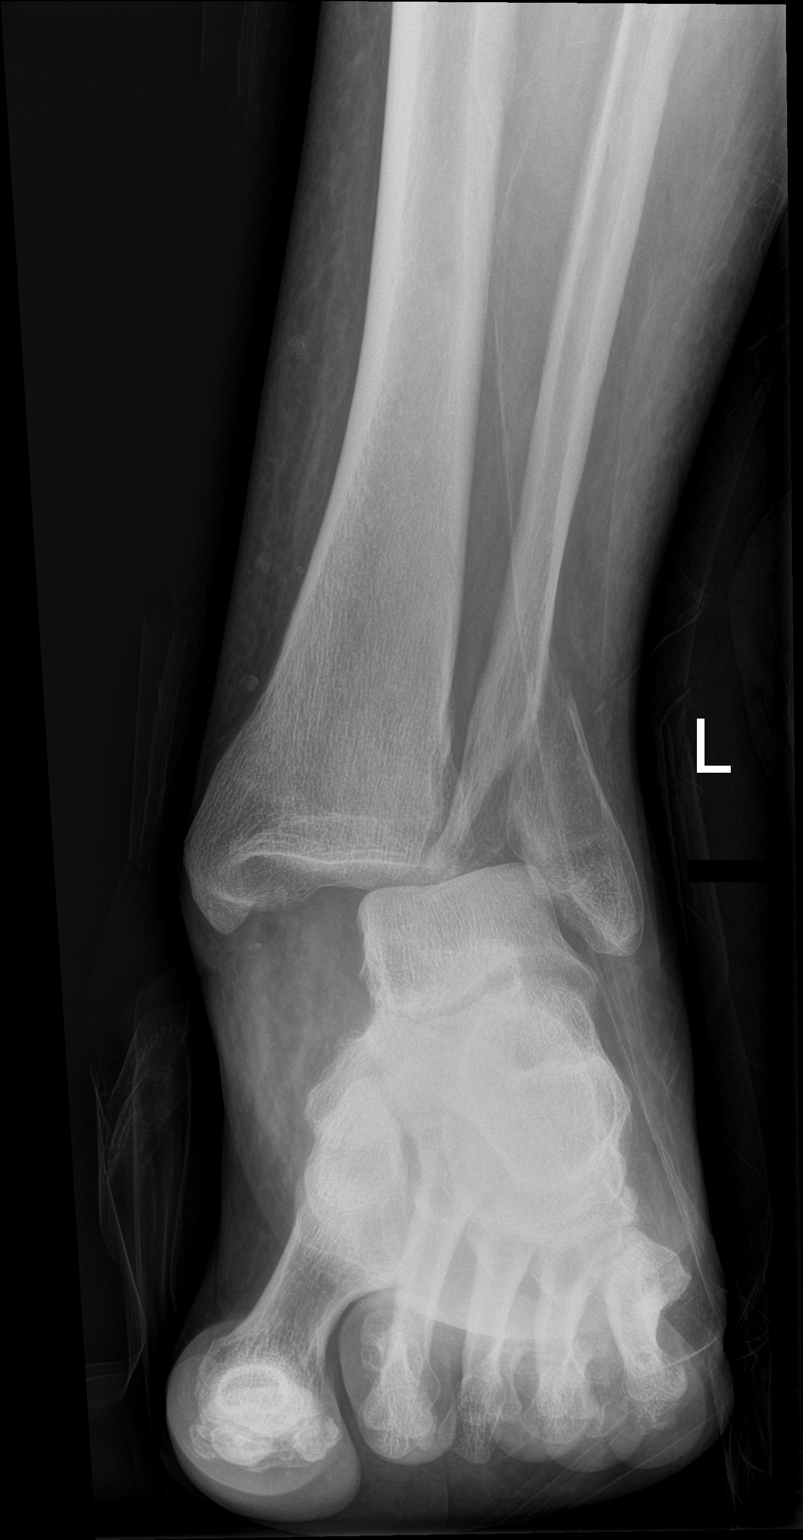

[ankle lat]
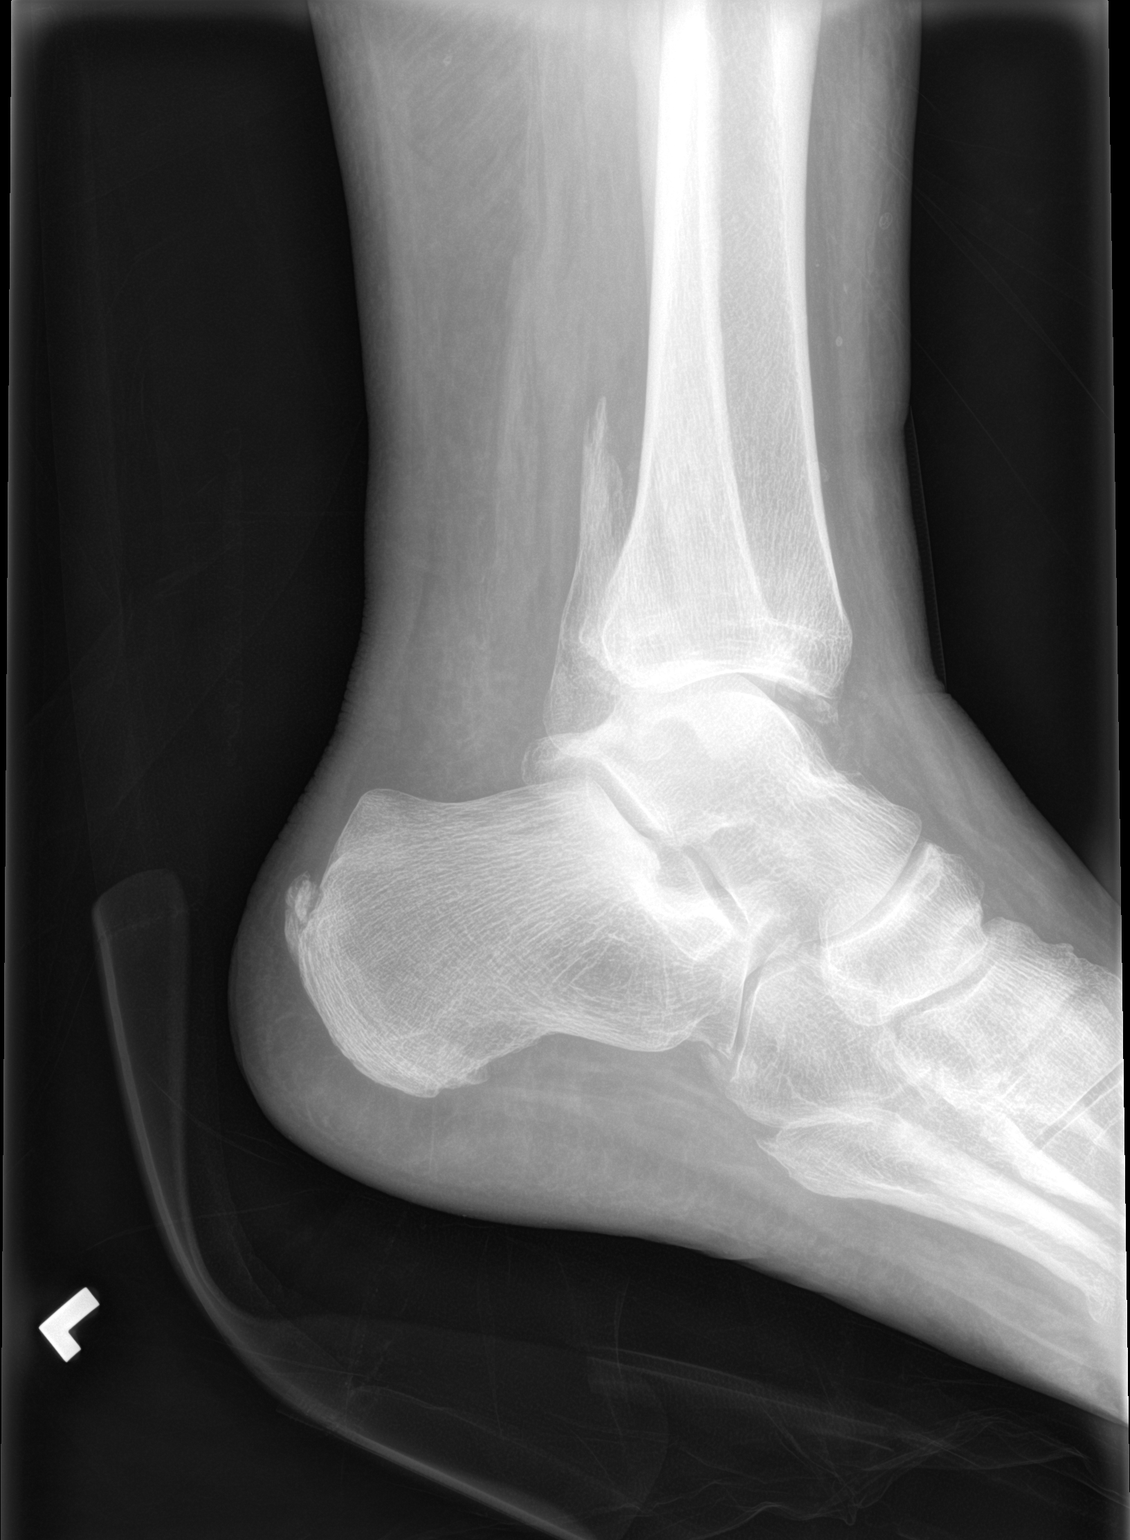

[3 of 3 positions shown; findings below may reference images not displayed]

FINDINGS: Oblique fracture through the distal fibular metaphysis is noted.
Lateral displacement of the talus with respect to the tibia is noted
measuring at least 2.3 cm. The talus is mildly posteriorly displaced
as well. No definitive distal tibial fracture is seen. Mild tarsal
degenerative changes are noted.
IMPRESSION: Distal fibular fracture with lateral subluxation/dislocation of the
talus with respect to the distal tibia.

## 2020-09-11 MED ORDER — TETANUS-DIPHTH-ACELL PERTUSSIS 5-2.5-18.5 LF-MCG/0.5 IM SUSY
0.5000 mL | PREFILLED_SYRINGE | Freq: Once | INTRAMUSCULAR | Status: AC
  Administered 2020-09-11: 0.5 mL via INTRAMUSCULAR
  Filled 2020-09-11: qty 0.5

## 2020-09-11 MED ORDER — FENTANYL CITRATE (PF) 100 MCG/2ML IJ SOLN
50.0000 ug | Freq: Once | INTRAMUSCULAR | Status: AC
  Administered 2020-09-11: 50 ug via INTRAVENOUS
  Filled 2020-09-11: qty 2

## 2020-09-11 MED ORDER — CEFAZOLIN SODIUM-DEXTROSE 2-4 GM/100ML-% IV SOLN
2.0000 g | INTRAVENOUS | Status: AC
  Administered 2020-09-12: 2 g via INTRAVENOUS
  Filled 2020-09-11: qty 100

## 2020-09-11 NOTE — ED Triage Notes (Signed)
Pt tripped in woods injuring left ankle. Pt has been vomiting since fall. Pt denies any loc.

## 2020-09-12 ENCOUNTER — Inpatient Hospital Stay (HOSPITAL_COMMUNITY): Payer: Medicare PPO

## 2020-09-12 ENCOUNTER — Encounter (HOSPITAL_COMMUNITY): Payer: Self-pay | Admitting: Family Medicine

## 2020-09-12 ENCOUNTER — Emergency Department (HOSPITAL_COMMUNITY): Payer: Medicare PPO

## 2020-09-12 DIAGNOSIS — E119 Type 2 diabetes mellitus without complications: Secondary | ICD-10-CM | POA: Diagnosis not present

## 2020-09-12 DIAGNOSIS — W19XXXA Unspecified fall, initial encounter: Secondary | ICD-10-CM | POA: Diagnosis not present

## 2020-09-12 DIAGNOSIS — J9601 Acute respiratory failure with hypoxia: Secondary | ICD-10-CM

## 2020-09-12 DIAGNOSIS — D649 Anemia, unspecified: Secondary | ICD-10-CM | POA: Diagnosis not present

## 2020-09-12 DIAGNOSIS — S82892A Other fracture of left lower leg, initial encounter for closed fracture: Secondary | ICD-10-CM

## 2020-09-12 DIAGNOSIS — Z20822 Contact with and (suspected) exposure to covid-19: Secondary | ICD-10-CM | POA: Diagnosis present

## 2020-09-12 DIAGNOSIS — N179 Acute kidney failure, unspecified: Secondary | ICD-10-CM

## 2020-09-12 DIAGNOSIS — Z23 Encounter for immunization: Secondary | ICD-10-CM | POA: Diagnosis present

## 2020-09-12 DIAGNOSIS — S82832A Other fracture of upper and lower end of left fibula, initial encounter for closed fracture: Secondary | ICD-10-CM | POA: Diagnosis present

## 2020-09-12 DIAGNOSIS — E785 Hyperlipidemia, unspecified: Secondary | ICD-10-CM

## 2020-09-12 DIAGNOSIS — S8262XA Displaced fracture of lateral malleolus of left fibula, initial encounter for closed fracture: Secondary | ICD-10-CM | POA: Diagnosis present

## 2020-09-12 DIAGNOSIS — Z6839 Body mass index (BMI) 39.0-39.9, adult: Secondary | ICD-10-CM | POA: Diagnosis not present

## 2020-09-12 DIAGNOSIS — M25572 Pain in left ankle and joints of left foot: Secondary | ICD-10-CM | POA: Diagnosis not present

## 2020-09-12 DIAGNOSIS — N400 Enlarged prostate without lower urinary tract symptoms: Secondary | ICD-10-CM | POA: Diagnosis present

## 2020-09-12 DIAGNOSIS — Z79899 Other long term (current) drug therapy: Secondary | ICD-10-CM | POA: Diagnosis not present

## 2020-09-12 DIAGNOSIS — J9811 Atelectasis: Secondary | ICD-10-CM | POA: Diagnosis present

## 2020-09-12 DIAGNOSIS — W010XXA Fall on same level from slipping, tripping and stumbling without subsequent striking against object, initial encounter: Secondary | ICD-10-CM | POA: Diagnosis present

## 2020-09-12 DIAGNOSIS — E1122 Type 2 diabetes mellitus with diabetic chronic kidney disease: Secondary | ICD-10-CM | POA: Diagnosis present

## 2020-09-12 DIAGNOSIS — S82892D Other fracture of left lower leg, subsequent encounter for closed fracture with routine healing: Secondary | ICD-10-CM | POA: Diagnosis not present

## 2020-09-12 DIAGNOSIS — R2681 Unsteadiness on feet: Secondary | ICD-10-CM | POA: Diagnosis not present

## 2020-09-12 DIAGNOSIS — N1831 Chronic kidney disease, stage 3a: Secondary | ICD-10-CM | POA: Diagnosis present

## 2020-09-12 DIAGNOSIS — I2510 Atherosclerotic heart disease of native coronary artery without angina pectoris: Secondary | ICD-10-CM | POA: Diagnosis present

## 2020-09-12 DIAGNOSIS — S82892K Other fracture of left lower leg, subsequent encounter for closed fracture with nonunion: Secondary | ICD-10-CM | POA: Diagnosis not present

## 2020-09-12 DIAGNOSIS — R52 Pain, unspecified: Secondary | ICD-10-CM | POA: Diagnosis present

## 2020-09-12 DIAGNOSIS — Y93K9 Activity, other involving animal care: Secondary | ICD-10-CM | POA: Diagnosis not present

## 2020-09-12 DIAGNOSIS — K219 Gastro-esophageal reflux disease without esophagitis: Secondary | ICD-10-CM | POA: Diagnosis present

## 2020-09-12 DIAGNOSIS — I447 Left bundle-branch block, unspecified: Secondary | ICD-10-CM | POA: Diagnosis present

## 2020-09-12 DIAGNOSIS — Z96652 Presence of left artificial knee joint: Secondary | ICD-10-CM | POA: Diagnosis present

## 2020-09-12 DIAGNOSIS — M1990 Unspecified osteoarthritis, unspecified site: Secondary | ICD-10-CM | POA: Diagnosis present

## 2020-09-12 DIAGNOSIS — Z77090 Contact with and (suspected) exposure to asbestos: Secondary | ICD-10-CM | POA: Diagnosis present

## 2020-09-12 DIAGNOSIS — E669 Obesity, unspecified: Secondary | ICD-10-CM | POA: Diagnosis present

## 2020-09-12 DIAGNOSIS — Z7984 Long term (current) use of oral hypoglycemic drugs: Secondary | ICD-10-CM | POA: Diagnosis not present

## 2020-09-12 LAB — BASIC METABOLIC PANEL
Anion gap: 11 (ref 5–15)
Anion gap: 8 (ref 5–15)
BUN: 16 mg/dL (ref 8–23)
BUN: 17 mg/dL (ref 8–23)
CO2: 26 mmol/L (ref 22–32)
CO2: 27 mmol/L (ref 22–32)
Calcium: 8.7 mg/dL — ABNORMAL LOW (ref 8.9–10.3)
Calcium: 8.8 mg/dL — ABNORMAL LOW (ref 8.9–10.3)
Chloride: 104 mmol/L (ref 98–111)
Chloride: 104 mmol/L (ref 98–111)
Creatinine, Ser: 1.22 mg/dL (ref 0.61–1.24)
Creatinine, Ser: 1.46 mg/dL — ABNORMAL HIGH (ref 0.61–1.24)
GFR, Estimated: 47 mL/min — ABNORMAL LOW (ref >60)
GFR, Estimated: 58 mL/min — ABNORMAL LOW (ref >60)
Glucose, Bld: 138 mg/dL — ABNORMAL HIGH (ref 70–99)
Glucose, Bld: 152 mg/dL — ABNORMAL HIGH (ref 70–99)
Potassium: 4.1 mmol/L (ref 3.5–5.1)
Potassium: 4.2 mmol/L (ref 3.5–5.1)
Sodium: 139 mmol/L (ref 135–145)
Sodium: 141 mmol/L (ref 135–145)

## 2020-09-12 LAB — CBC WITH DIFFERENTIAL/PLATELET
Abs Immature Granulocytes: 0.09 10*3/uL — ABNORMAL HIGH (ref 0.00–0.07)
Basophils Absolute: 0 10*3/uL (ref 0.0–0.1)
Basophils Relative: 0 %
Eosinophils Absolute: 0 10*3/uL (ref 0.0–0.5)
Eosinophils Relative: 0 %
HCT: 41.5 % (ref 39.0–52.0)
Hemoglobin: 12.5 g/dL — ABNORMAL LOW (ref 13.0–17.0)
Immature Granulocytes: 1 %
Lymphocytes Relative: 5 %
Lymphs Abs: 0.7 10*3/uL (ref 0.7–4.0)
MCH: 26.5 pg (ref 26.0–34.0)
MCHC: 30.1 g/dL (ref 30.0–36.0)
MCV: 88.1 fL (ref 80.0–100.0)
Monocytes Absolute: 0.6 10*3/uL (ref 0.1–1.0)
Monocytes Relative: 5 %
Neutro Abs: 10.8 10*3/uL — ABNORMAL HIGH (ref 1.7–7.7)
Neutrophils Relative %: 89 %
Platelets: 226 10*3/uL (ref 150–400)
RBC: 4.71 MIL/uL (ref 4.22–5.81)
RDW: 18.9 % — ABNORMAL HIGH (ref 11.5–15.5)
WBC: 12.2 10*3/uL — ABNORMAL HIGH (ref 4.0–10.5)
nRBC: 0 % (ref 0.0–0.2)

## 2020-09-12 LAB — SURGICAL PCR SCREEN
MRSA, PCR: NEGATIVE
Staphylococcus aureus: NEGATIVE

## 2020-09-12 LAB — TYPE AND SCREEN
ABO/RH(D): O POS
Antibody Screen: NEGATIVE

## 2020-09-12 LAB — PROTIME-INR
INR: 1.1 (ref 0.8–1.2)
Prothrombin Time: 13.8 seconds (ref 11.4–15.2)

## 2020-09-12 LAB — CBC
HCT: 39.4 % (ref 39.0–52.0)
Hemoglobin: 11.8 g/dL — ABNORMAL LOW (ref 13.0–17.0)
MCH: 26.5 pg (ref 26.0–34.0)
MCHC: 29.9 g/dL — ABNORMAL LOW (ref 30.0–36.0)
MCV: 88.3 fL (ref 80.0–100.0)
Platelets: 213 10*3/uL (ref 150–400)
RBC: 4.46 MIL/uL (ref 4.22–5.81)
RDW: 18.5 % — ABNORMAL HIGH (ref 11.5–15.5)
WBC: 10.6 10*3/uL — ABNORMAL HIGH (ref 4.0–10.5)
nRBC: 0 % (ref 0.0–0.2)

## 2020-09-12 LAB — RESP PANEL BY RT-PCR (FLU A&B, COVID) ARPGX2
Influenza A by PCR: NEGATIVE
Influenza B by PCR: NEGATIVE
SARS Coronavirus 2 by RT PCR: NEGATIVE

## 2020-09-12 LAB — D-DIMER, QUANTITATIVE: D-Dimer, Quant: 2.09 ug/mL-FEU — ABNORMAL HIGH (ref 0.00–0.50)

## 2020-09-12 IMAGING — DX DG ANKLE PORT 2V*L*
3 series · 3 of 3 positions shown · non-contrast
Comparison: Films from the previous day.

CLINICAL DATA: Status post reduction

EXAM:
PORTABLE LEFT ANKLE - 2 VIEW

[ankle ap (1 of 2)]
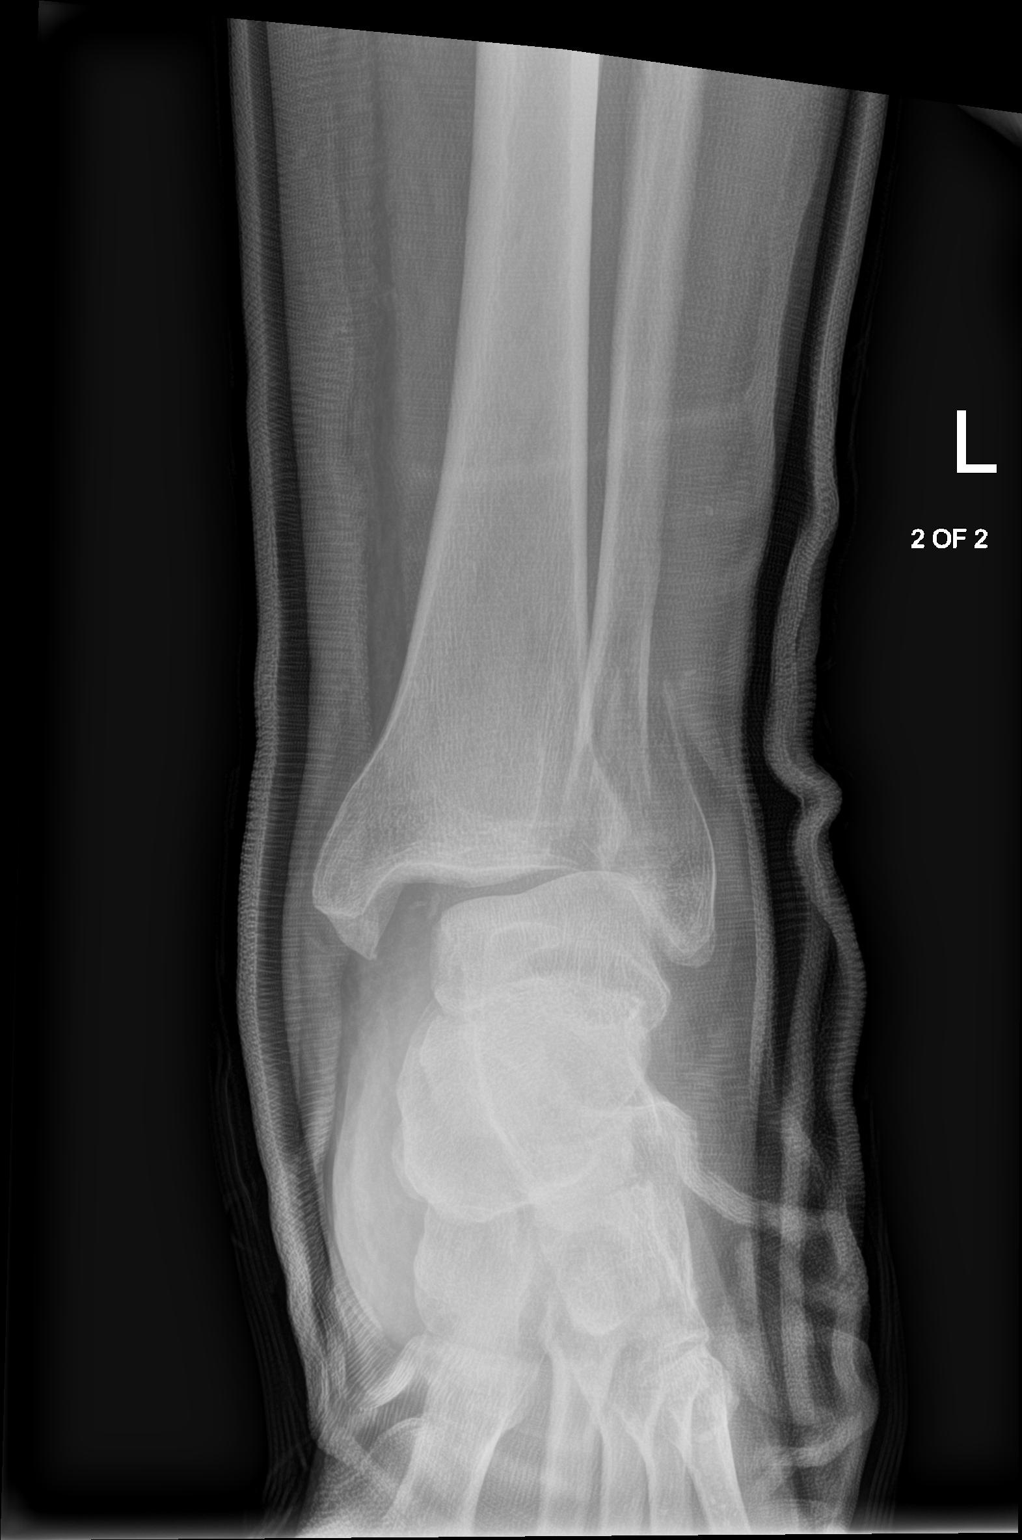

[ankle ap (2 of 2)]
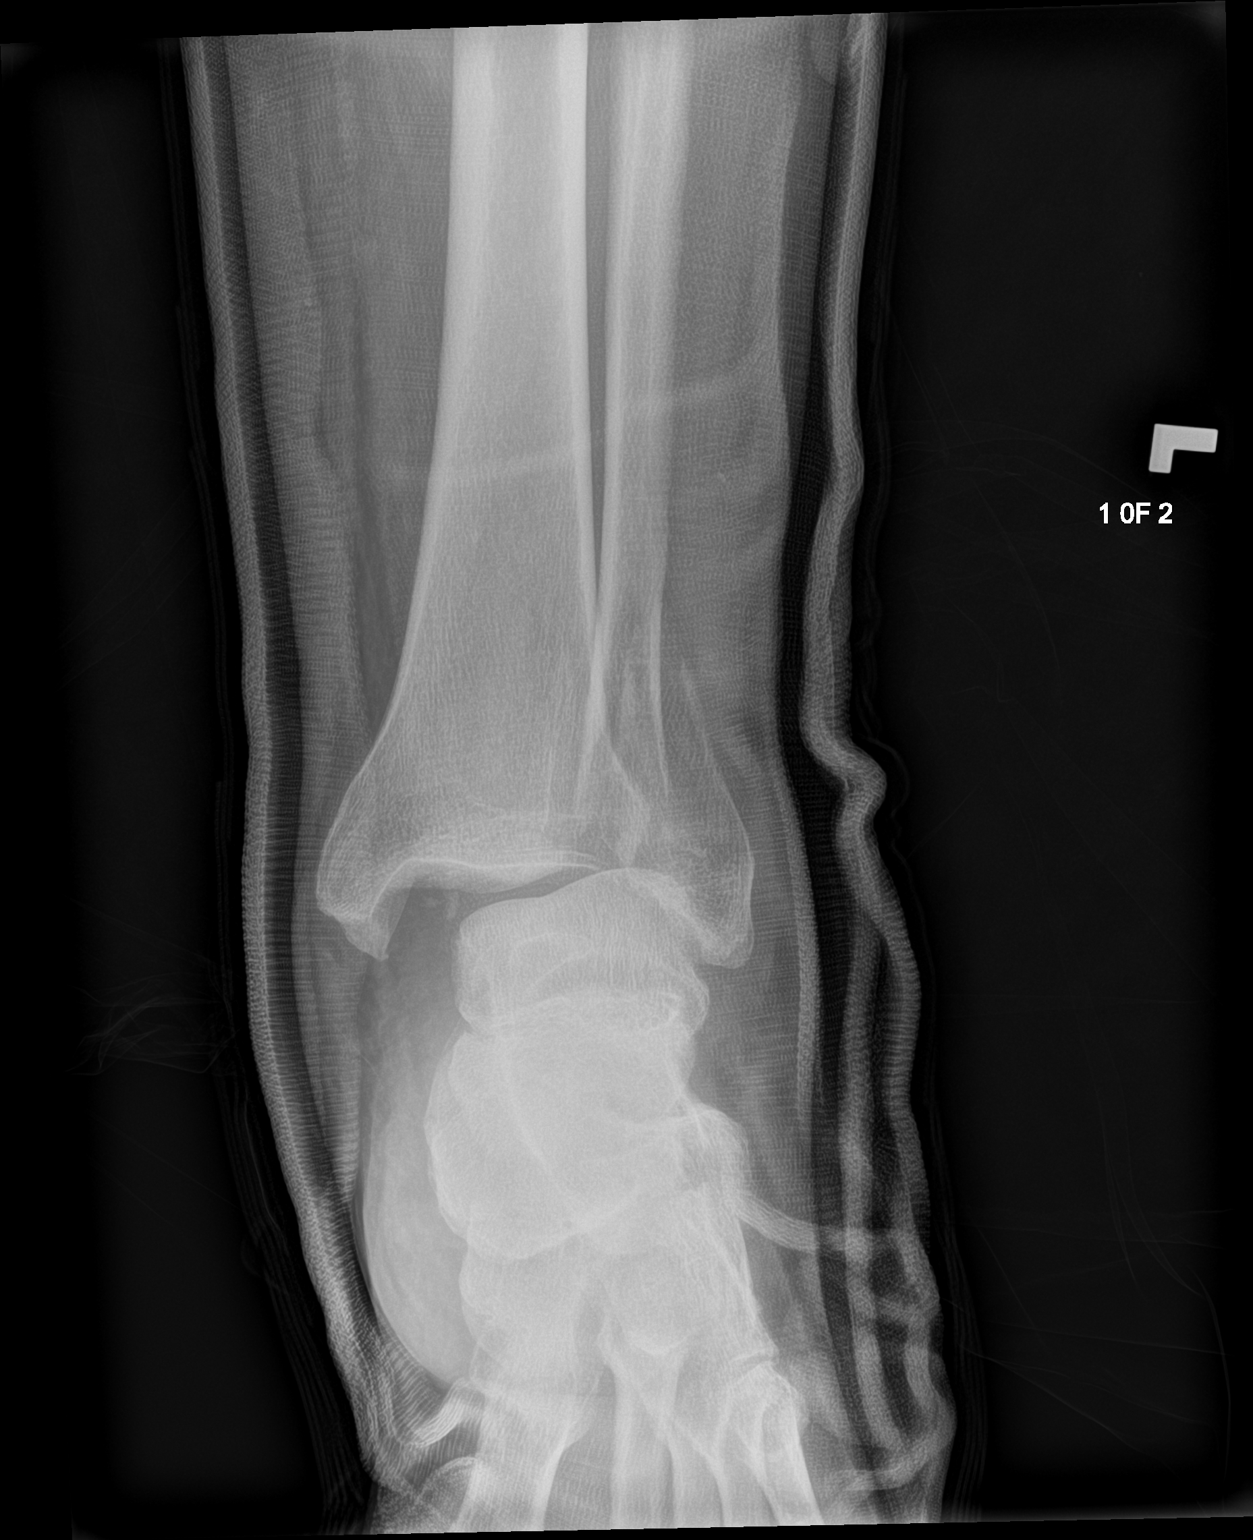

[ankle lat]
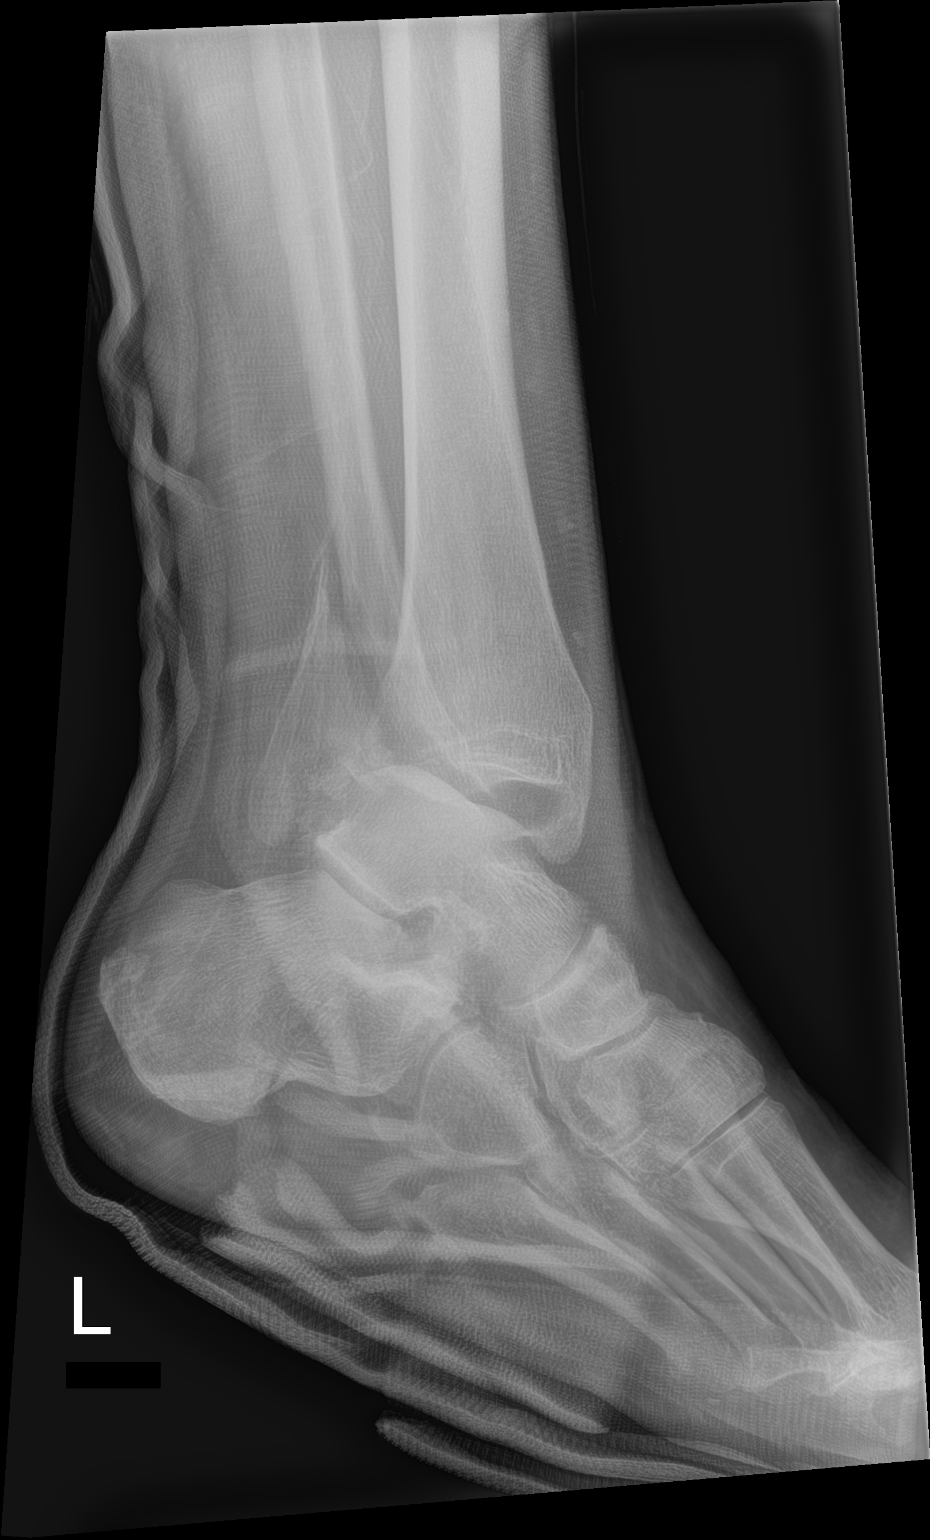

[3 of 3 positions shown; findings below may reference images not displayed]

FINDINGS: There has been some reduction with improved positioning of the talus
although there remains significant lateral displacement. Distal
fibular fracture is again seen. A few small bony densities are noted
consistent with avulsions in the tibiotalar space, not well
visualized on prior exam.
IMPRESSION: Interval reduction although persistent lateral displacement of the
talus with respect to the distal tibia is seen.

## 2020-09-12 IMAGING — CT CT ANGIO CHEST
2 of 6 series · 18 of 46 positions shown · IV contrast (Omnipaque or Isovue)
Comparison: Single-view of the chest [DATE]. CT chest, abdomen
and pelvis [DATE].

CLINICAL DATA: Elevated D-dimer.

EXAM:
CT ANGIOGRAPHY CHEST WITH CONTRAST
TECHNIQUE: Multidetector CT imaging of the chest was performed using the
standard protocol during bolus administration of intravenous
contrast. Multiplanar CT image reconstructions and MIPs were
obtained to evaluate the vascular anatomy.
CONTRAST:  100 mL OMNIPAQUE IOHEXOL 350 MG/ML SOLN

[Series 5: pe axial thins · axial · 0.94mm/px · z∈[-491,-236]mm · 15 of 349 slices shown]
[im 15/349  lung]
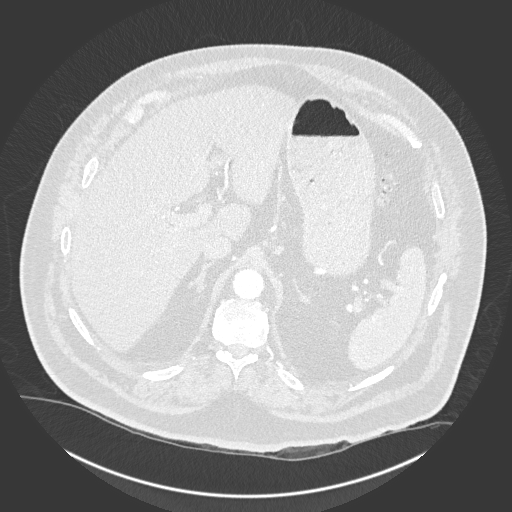
[im 44/349  soft-tissue]
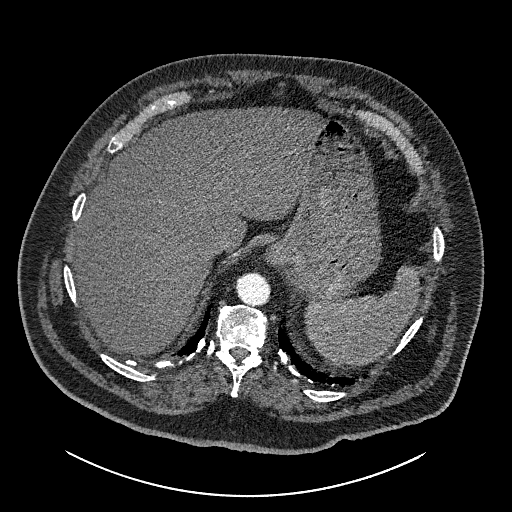
[im 59/349  lung]
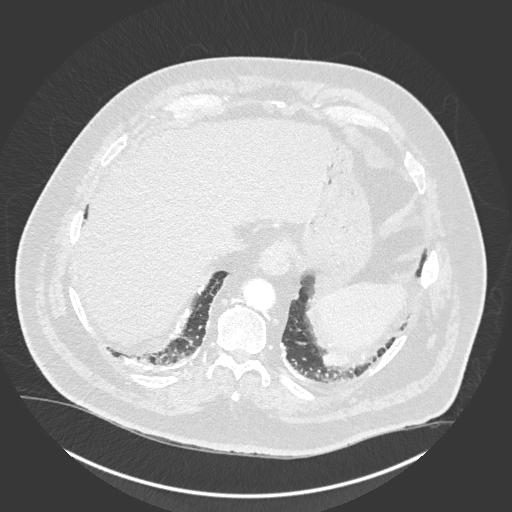
[im 88/349  soft-tissue]
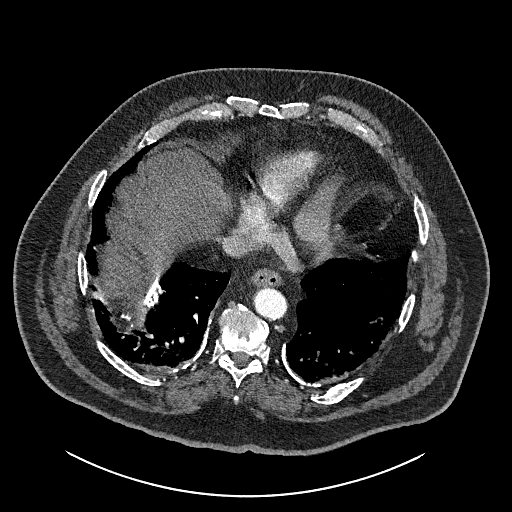
[im 102/349  lung]
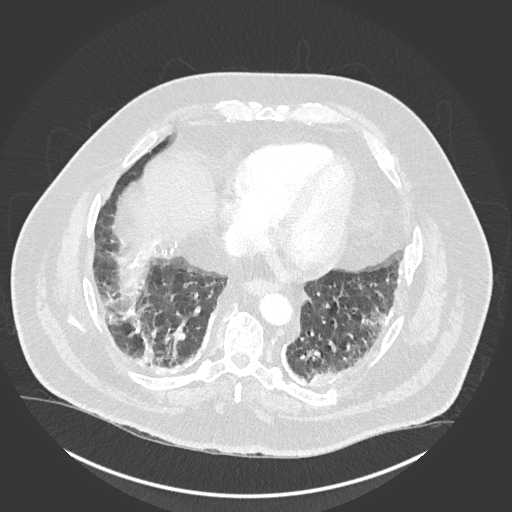
[im 131/349  soft-tissue]
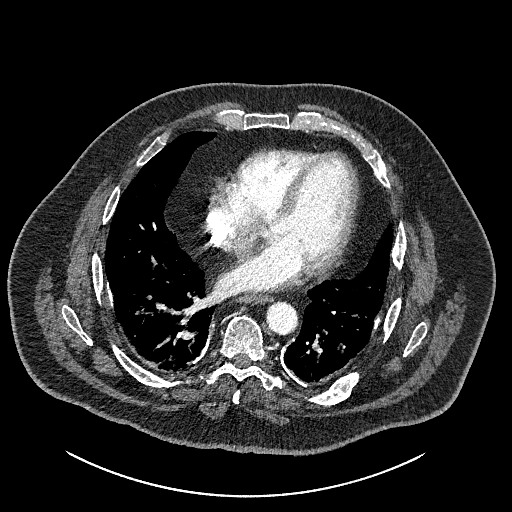
[im 146/349  lung]
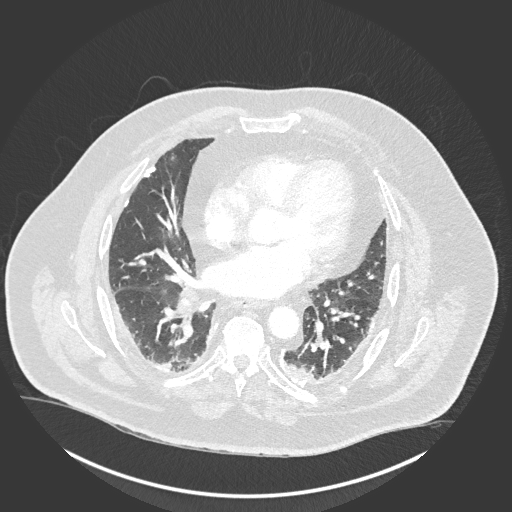
[im 175/349  soft-tissue]
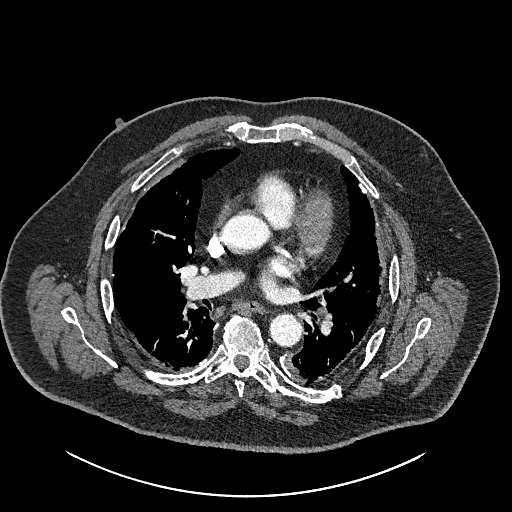
[im 204/349  lung]
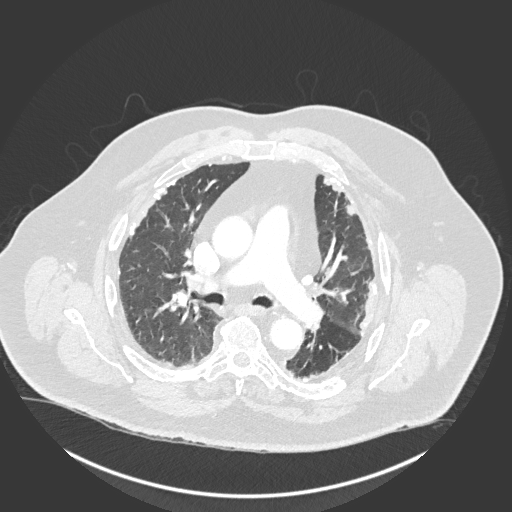
[im 218/349  soft-tissue]
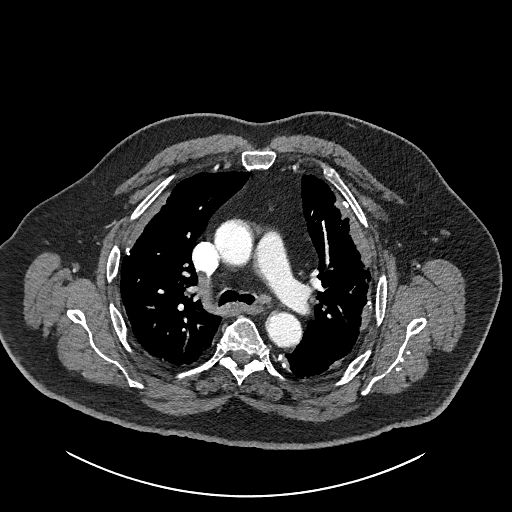
[im 247/349  lung]
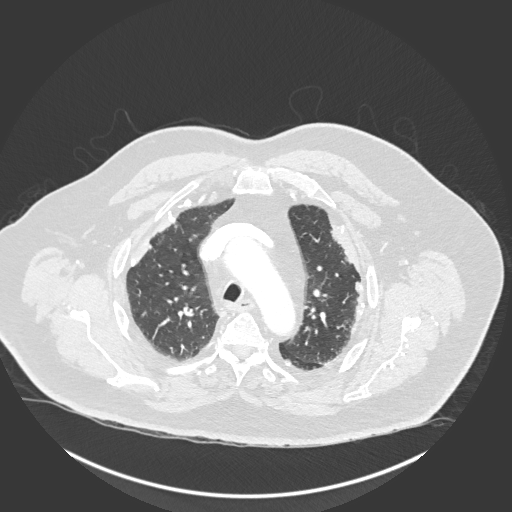
[im 262/349  soft-tissue]
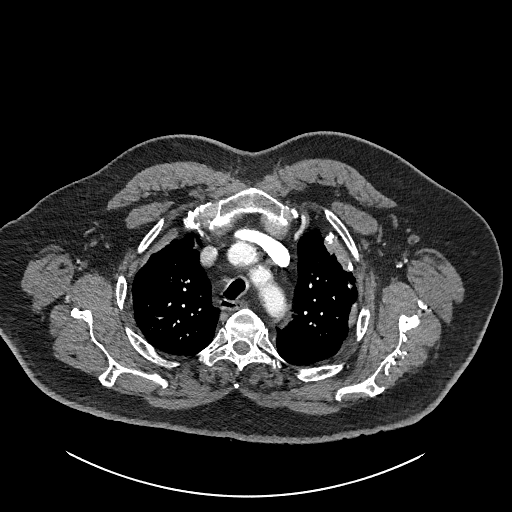
[im 291/349  lung]
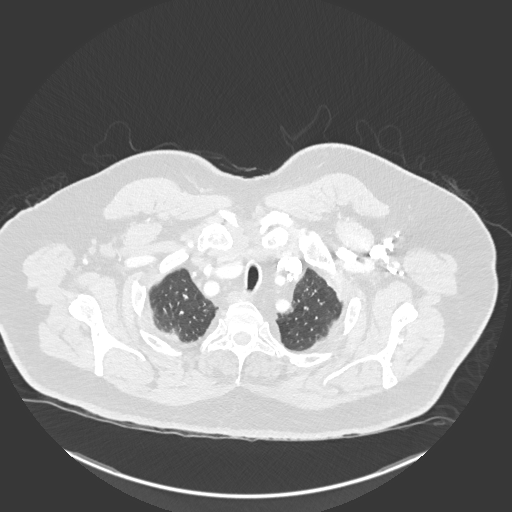
[im 305/349  soft-tissue]
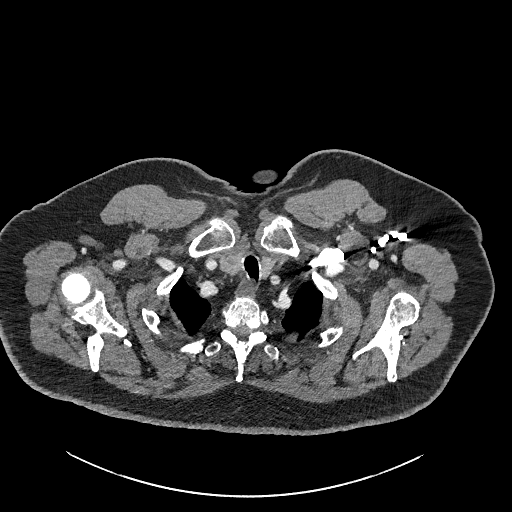
[im 334/349  lung]
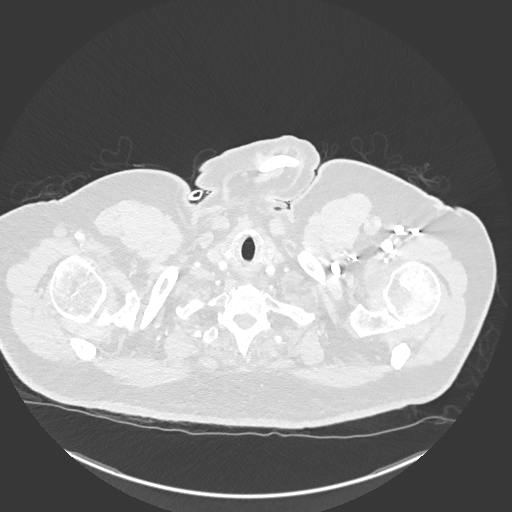

[Series 7: cor soft · coronal · 0.58mm/px · 3 of 190 slices shown]
[im 48/190  soft-tissue]
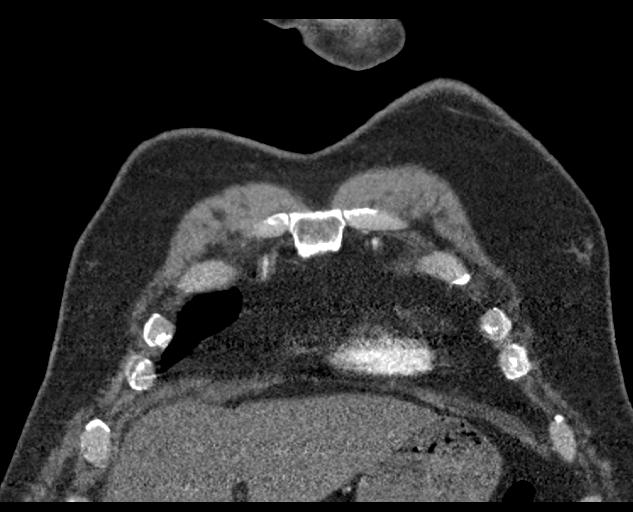
[im 95/190  soft-tissue]
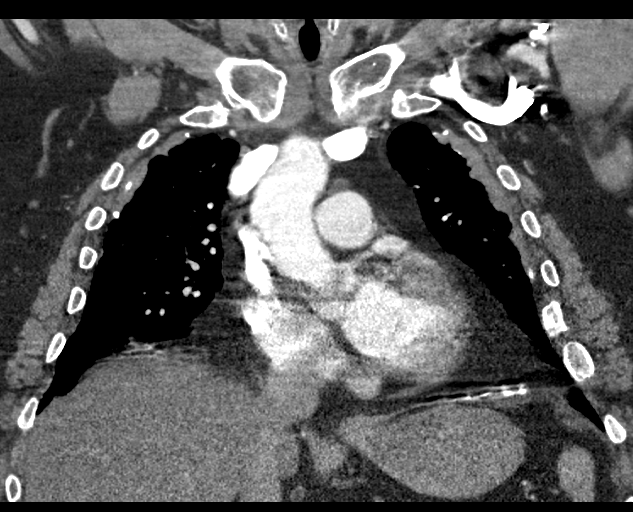
[im 142/190  soft-tissue]
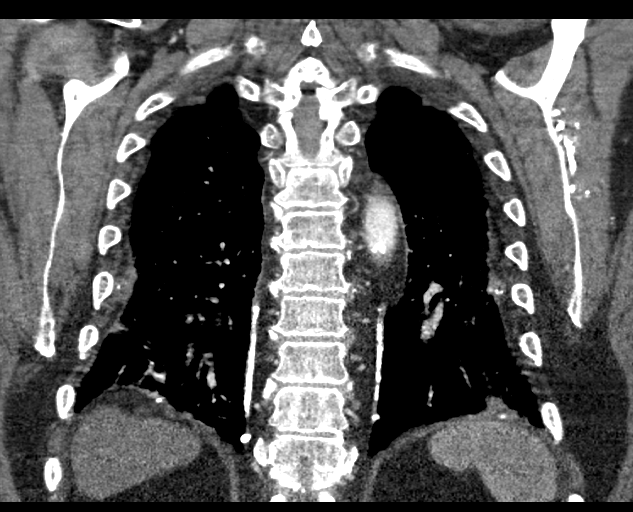

[18 of 46 positions shown; findings below may reference images not displayed]

FINDINGS: Cardiovascular: Satisfactory opacification of the pulmonary arteries
to the segmental level. No evidence of pulmonary embolism. Normal
heart size. No pericardial effusion. Calcific aortic and coronary
atherosclerosis noted.

Mediastinum/Nodes: No enlarged mediastinal, hilar, or axillary lymph
nodes. Thyroid gland, trachea, and esophagus demonstrate no
significant findings.

Lungs/Pleura: Extensive calcified pleural plaquing is again seen.
Mild dependent atelectasis. No consolidative process, nodule or
mass. No pleural effusion.

Upper Abdomen: The visualized liver demonstrates fatty infiltration.

Musculoskeletal: No acute bony abnormality. Remote healed left rib
fractures noted.

Review of the MIP images confirms the above findings.
IMPRESSION: Negative for pulmonary embolus or acute disease.

Extensive calcified pleural plaques consistent with prior asbestos
exposure.

Calcific coronary artery disease.

Fatty infiltration of the liver.

Aortic Atherosclerosis ([GC]-[GC]).

## 2020-09-12 IMAGING — DX DG CHEST 1V PORT
1 series · 1 of 1 positions shown · non-contrast
Comparison: [DATE]

CLINICAL DATA: Shortness of breath history of left ankle fracture

EXAM:
PORTABLE CHEST 1 VIEW

[chest ap grid]
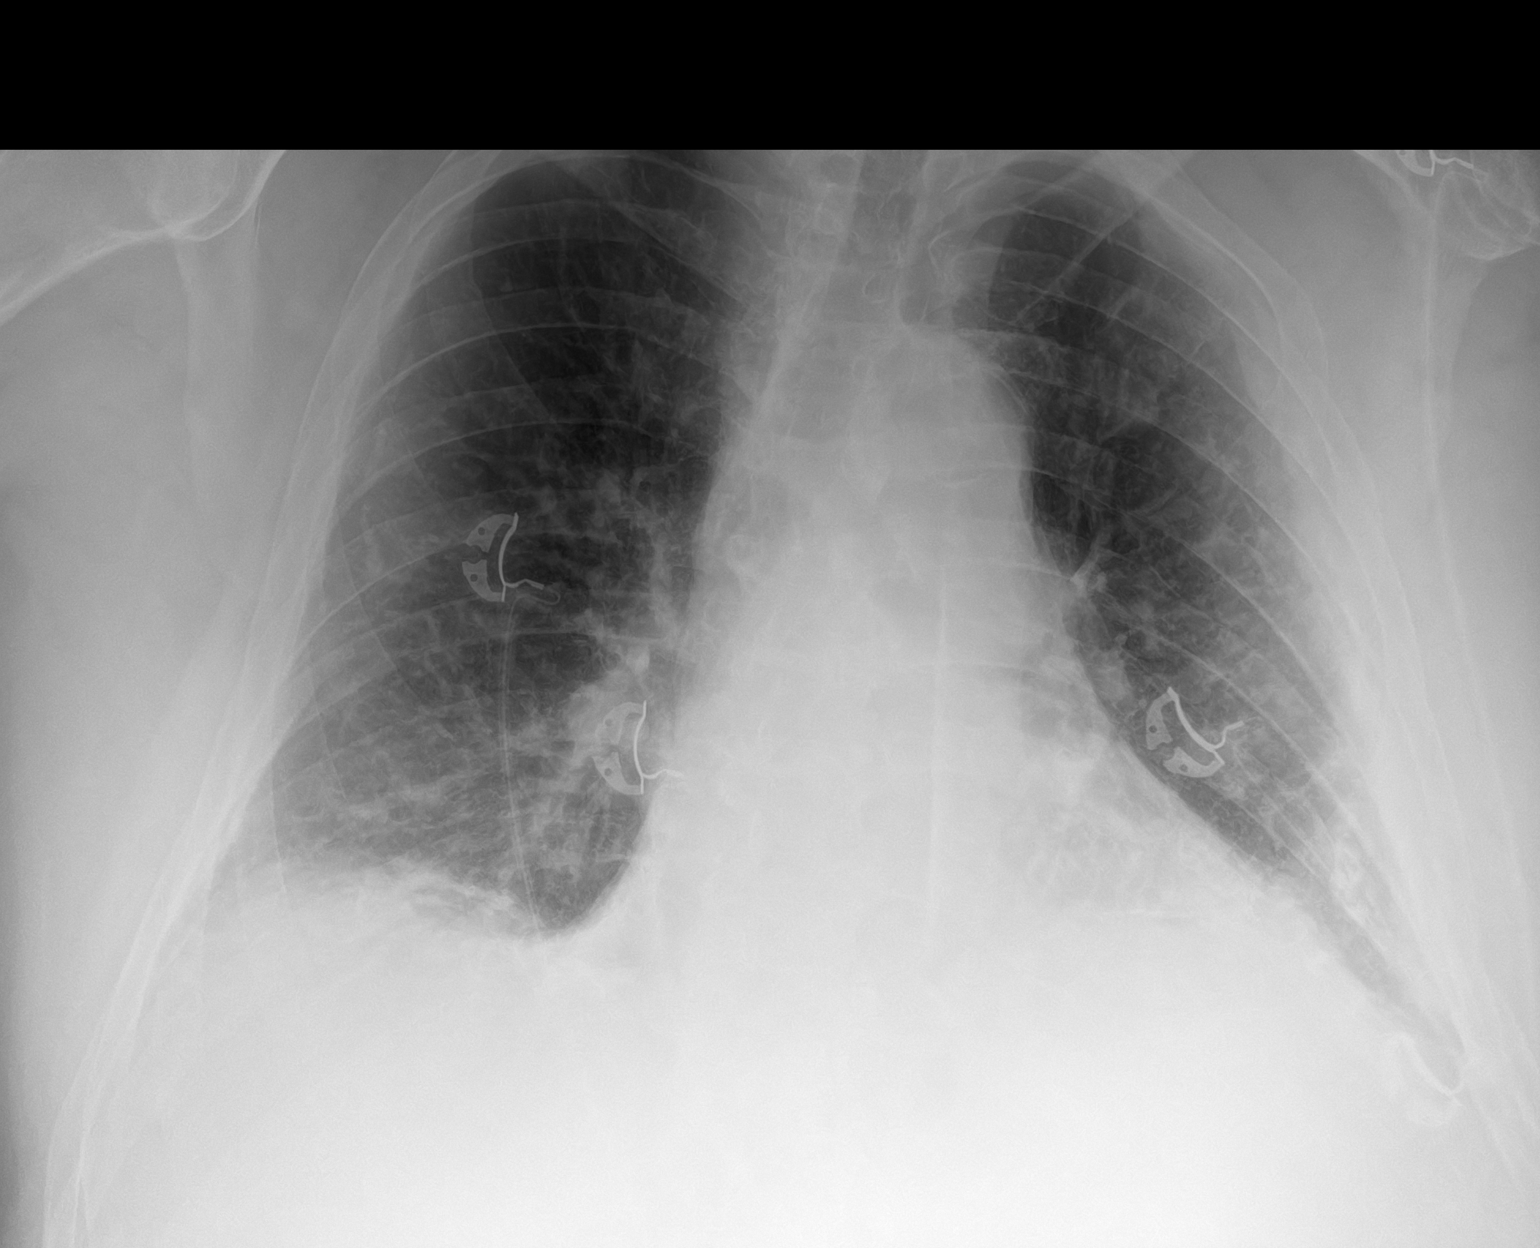

[1 of 1 positions shown; findings below may reference images not displayed]

FINDINGS: Cardiac shadow is mildly enlarged but accentuated by the portable
technique. Aortic calcifications are noted. Bibasilar atelectatic
changes are seen. No confluent infiltrate or effusion is noted. No
bony abnormality is seen.
IMPRESSION: Bibasilar atelectatic changes as described.

## 2020-09-12 MED ORDER — ACETAMINOPHEN 650 MG RE SUPP: 650.0000 mg | Freq: Four times a day (QID) | RECTAL | Status: DC | PRN

## 2020-09-12 MED ORDER — HYDROMORPHONE HCL 1 MG/ML IJ SOLN
1.0000 mg | INTRAMUSCULAR | Status: DC | PRN
  Administered 2020-09-12: 1 mg via INTRAVENOUS
  Filled 2020-09-12: qty 1

## 2020-09-12 MED ORDER — HYDROMORPHONE HCL 1 MG/ML IJ SOLN
0.5000 mg | INTRAMUSCULAR | Status: DC | PRN
  Administered 2020-09-12 – 2020-09-13 (×2): 0.5 mg via INTRAVENOUS
  Filled 2020-09-12 (×2): qty 0.5

## 2020-09-12 MED ORDER — PANTOPRAZOLE SODIUM 40 MG PO TBEC
40.0000 mg | DELAYED_RELEASE_TABLET | Freq: Two times a day (BID) | ORAL | Status: DC
  Administered 2020-09-12 – 2020-09-18 (×13): 40 mg via ORAL
  Filled 2020-09-12 (×13): qty 1

## 2020-09-12 MED ORDER — ALUM & MAG HYDROXIDE-SIMETH 200-200-20 MG/5ML PO SUSP
30.0000 mL | ORAL | Status: DC | PRN
  Administered 2020-09-12: 30 mL via ORAL
  Filled 2020-09-12: qty 30

## 2020-09-12 MED ORDER — TRAZODONE HCL 50 MG PO TABS
25.0000 mg | ORAL_TABLET | Freq: Every evening | ORAL | Status: DC | PRN
  Administered 2020-09-13: 25 mg via ORAL
  Filled 2020-09-12 (×2): qty 1

## 2020-09-12 MED ORDER — PRAVASTATIN SODIUM 40 MG PO TABS
80.0000 mg | ORAL_TABLET | Freq: Every day | ORAL | Status: DC
  Administered 2020-09-12 – 2020-09-17 (×6): 80 mg via ORAL
  Filled 2020-09-12 (×6): qty 2

## 2020-09-12 MED ORDER — CEFAZOLIN IN SODIUM CHLORIDE 3-0.9 GM/100ML-% IV SOLN
3.0000 g | INTRAVENOUS | Status: AC
  Administered 2020-09-13: 3 g via INTRAVENOUS
  Filled 2020-09-12 (×2): qty 100

## 2020-09-12 MED ORDER — MORPHINE SULFATE (PF) 2 MG/ML IV SOLN
2.0000 mg | INTRAVENOUS | Status: DC | PRN
  Administered 2020-09-12: 2 mg via INTRAVENOUS
  Filled 2020-09-12: qty 1

## 2020-09-12 MED ORDER — ONDANSETRON HCL 4 MG/2ML IJ SOLN
4.0000 mg | Freq: Four times a day (QID) | INTRAMUSCULAR | Status: DC | PRN
  Administered 2020-09-12: 4 mg via INTRAVENOUS
  Filled 2020-09-12: qty 2

## 2020-09-12 MED ORDER — CHLORHEXIDINE GLUCONATE 4 % EX LIQD
60.0000 mL | Freq: Once | CUTANEOUS | Status: AC
  Administered 2020-09-13: 4 via TOPICAL
  Filled 2020-09-12: qty 60

## 2020-09-12 MED ORDER — ACETAMINOPHEN 325 MG PO TABS
650.0000 mg | ORAL_TABLET | Freq: Four times a day (QID) | ORAL | Status: DC | PRN
  Administered 2020-09-12 – 2020-09-13 (×3): 650 mg via ORAL
  Filled 2020-09-12 (×3): qty 2

## 2020-09-12 MED ORDER — IPRATROPIUM-ALBUTEROL 0.5-2.5 (3) MG/3ML IN SOLN
3.0000 mL | Freq: Four times a day (QID) | RESPIRATORY_TRACT | Status: DC
  Administered 2020-09-13: 3 mL via RESPIRATORY_TRACT
  Filled 2020-09-12: qty 3

## 2020-09-12 MED ORDER — POVIDONE-IODINE 10 % EX SWAB: 2.0000 "application " | Freq: Once | CUTANEOUS | Status: DC

## 2020-09-12 MED ORDER — IOHEXOL 350 MG/ML SOLN
100.0000 mL | Freq: Once | INTRAVENOUS | Status: AC | PRN
  Administered 2020-09-12: 100 mL via INTRAVENOUS

## 2020-09-12 MED ORDER — SODIUM CHLORIDE 0.9 % IV SOLN
INTRAVENOUS | Status: DC
  Administered 2020-09-12: 1000 mL via INTRAVENOUS

## 2020-09-12 MED ORDER — SODIUM CHLORIDE 0.9 % IV BOLUS (SEPSIS)
500.0000 mL | Freq: Once | INTRAVENOUS | Status: AC
  Administered 2020-09-12: 500 mL via INTRAVENOUS

## 2020-09-12 MED ORDER — MAGNESIUM HYDROXIDE 400 MG/5ML PO SUSP: 30.0000 mL | Freq: Every day | ORAL | Status: DC | PRN

## 2020-09-12 MED ORDER — ONDANSETRON HCL 4 MG PO TABS: 4.0000 mg | ORAL_TABLET | Freq: Four times a day (QID) | ORAL | Status: DC | PRN

## 2020-09-12 MED ORDER — TAMSULOSIN HCL 0.4 MG PO CAPS
0.4000 mg | ORAL_CAPSULE | Freq: Every day | ORAL | Status: DC
  Administered 2020-09-12 – 2020-09-18 (×7): 0.4 mg via ORAL
  Filled 2020-09-12 (×7): qty 1

## 2020-09-12 MED ORDER — PANTOPRAZOLE SODIUM 40 MG IV SOLR
40.0000 mg | Freq: Two times a day (BID) | INTRAVENOUS | Status: DC
  Administered 2020-09-12: 40 mg via INTRAVENOUS
  Filled 2020-09-12: qty 40

## 2020-09-12 MED ORDER — OXYCODONE HCL 5 MG PO TABS
5.0000 mg | ORAL_TABLET | Freq: Four times a day (QID) | ORAL | Status: DC | PRN
  Administered 2020-09-12 – 2020-09-13 (×3): 5 mg via ORAL
  Filled 2020-09-12 (×3): qty 1

## 2020-09-12 MED ORDER — PROPOFOL 10 MG/ML IV BOLUS
40.0000 mg | Freq: Once | INTRAVENOUS | Status: AC
  Administered 2020-09-12: 40 mg via INTRAVENOUS
  Filled 2020-09-12: qty 20

## 2020-09-12 NOTE — Progress Notes (Addendum)
Called to patient's room by RN to do conscious sedation on patient with broken left tibial fracture.  Procedure went well to bandage foot, no total loss of consciousness.  Did take patient up to 4L during procedure for slight drop in O2 during procedure.  Patient now back on 3L with sat of 98%.

## 2020-09-12 NOTE — Progress Notes (Signed)
Patient ID: Angel Norman, male   DOB: 01/19/1933, 87 y.o.   MRN: [id number]  LEFT ANKLE FRACTURE   Past Medical History:  Diagnosis Date   Arthritis    GERD (gastroesophageal reflux disease)    Hyperlipidemia    Rib fractures 12/06/2014    CAME IN HYPOXIC !  SURGERY DAY PENDING

## 2020-09-12 NOTE — ED Provider Notes (Signed)
[facility] EMERGENCY DEPARTMENT Provider Note   CSN: [id number] Arrival date & time: 09/11/20  2220     History Chief Complaint  Patient presents with   Ankle Pain    Angel ChancellorDavid F Norman is a 87 y.o. male.  The history is provided by the patient.  Ankle Pain Location:  Ankle Ankle location:  L ankle Pain details:    Quality:  Aching   Radiates to:  Does not radiate   Severity:  Moderate   Onset quality:  Sudden   Timing:  Constant   Progression:  Worsening Chronicity:  New Dislocation: yes   Relieved by:  Nothing Exacerbated by: movement. Associated symptoms: no back pain, no fever and no neck pain   Patient with history of hyperlipidemia presents after a fall and left ankle injury.  Patient reports he was out feeding his goats when he tripped on a rock and twisted his left ankle.  He denies any head injury. He reports pain only in his left ankle.  No neck or back pain is reported.  No chest pain is reported    Past Medical History:  Diagnosis Date   Arthritis    GERD (gastroesophageal reflux disease)    Hyperlipidemia    Rib fractures 12/06/2014    Patient Active Problem List   Diagnosis Date Noted   UGIB (upper gastrointestinal bleed) 05/09/2018   Anemia due to acute blood loss 05/09/2018   Obesity, Class III, BMI 40-49.9 (morbid obesity) (HCC) 05/09/2018   Acute lower UTI 05/09/2018   GERD (gastroesophageal reflux disease) 05/08/2018   AKI (acute kidney injury) (HCC) 05/07/2018   Blunt chest trauma 12/07/2014   Ileus (HCC) 12/07/2014   Fracture of multiple ribs of left side 12/06/2014    Past Surgical History:  Procedure Laterality Date   ESOPHAGOGASTRODUODENOSCOPY (EGD) WITH PROPOFOL N/A 05/08/2018   Procedure: ESOPHAGOGASTRODUODENOSCOPY (EGD) WITH PROPOFOL;  Surgeon: Malissa Hippoehman, Najeeb U, MD;  Location: [facility];  Service: Endoscopy;  Laterality: N/A;   goiter removal     TOTAL KNEE ARTHROPLASTY Left 08/18/2012   Dr Lajoyce Cornersuda   TOTAL KNEE ARTHROPLASTY Left  08/19/2012   Procedure: TOTAL KNEE ARTHROPLASTY;  Surgeon: Nadara MustardMarcus V Duda, MD;  [facility];  Service: Orthopedics;  Laterality: Left;  Left Total Knee Arthroplasty       No family history on file.  Social History   Tobacco Use   Smoking status: Never   Smokeless tobacco: Never  Vaping Use   Vaping Use: Never used  Substance Use Topics   Alcohol use: No   Drug use: No    Home Medications Prior to Admission medications   Medication Sig Start Date End Date Taking? Authorizing Provider  meloxicam (MOBIC) 7.5 MG tablet Take 7.5 mg by mouth as needed for pain.    [provider]  naproxen (NAPROSYN) 500 MG tablet Take 500 mg by mouth as needed. Per Daughter he only takes as needed.     [provider]  pantoprazole (PROTONIX) 40 MG tablet Take 1 tablet (40 mg total) by mouth 2 (two) times daily. Needs to use twice a day for 12 weeks for gastritis 05/09/18 05/09/19  Myrtie NeitherUgah, Nwannadiya, MD  pravastatin (PRAVACHOL) 80 MG tablet Take 80 mg by mouth daily.    [provider]  tamsulosin (FLOMAX) 0.4 MG CAPS capsule Take 0.4 mg by mouth daily.    [provider]    Allergies    Patient has no known allergies.  Review of Systems  Review of Systems  Constitutional:  Negative for fever.  Respiratory:  Negative for shortness of breath.   Cardiovascular:  Negative for chest pain.  Musculoskeletal:  Positive for arthralgias and joint swelling. Negative for back pain and neck pain.  Neurological:  Negative for headaches.  All other systems reviewed and are negative.  Physical Exam Updated Vital Signs BP 124/60   Pulse 81   Temp (!) 97.5 F (36.4 C)   Resp 16   Ht 1.905 m (6\' 3" )   Wt (!) 137 kg   SpO2 100%   BMI 37.75 kg/m   Physical Exam CONSTITUTIONAL: Elderly, no acute distress HEAD: Normocephalic/atraumatic EYES: EOMI/PERRL ENMT: Mucous membranes moist NECK: supple no meningeal signs SPINE/BACK: No cervical spine tenderness CV: S1/S2  noted LUNGS: Lungs are clear to auscultation bilaterally ABDOMEN: soft, nontender, no rebound or guarding, bowel sounds noted throughout abdomen GU:no cva tenderness NEURO: Pt is awake/alert/appropriate, moves all extremitiesx4.   EXTREMITIES: pulses normal/equal in both feet, obvious deformity to left ankle with skin tenting over the medial malleolus abrasion is noted.  No lacerations or bleeding see photo All other extremities/joints palpated/ranged and nontender SKIN: warm, see photo PSYCH: no abnormalities of mood noted, alert and oriented to situation   ED Results / Procedures / Treatments   Labs (all labs ordered are listed, but only abnormal results are displayed) Labs Reviewed  BASIC METABOLIC PANEL - Abnormal; Notable for the following components:      Result Value   Glucose, Bld 152 (*)    Creatinine, Ser 1.46 (*)    Calcium 8.7 (*)    GFR, Estimated 47 (*)    All other components within normal limits  CBC WITH DIFFERENTIAL/PLATELET - Abnormal; Notable for the following components:   WBC 12.2 (*)    Hemoglobin 12.5 (*)    RDW 18.9 (*)    Neutro Abs 10.8 (*)    Abs Immature Granulocytes 0.09 (*)    All other components within normal limits  CBG MONITORING, ED - Abnormal; Notable for the following components:   Glucose-Capillary 177 (*)    All other components within normal limits  RESP PANEL BY RT-PCR (FLU A&B, COVID) [id number]  PROTIME-INR  TYPE AND SCREEN    EKG EKG Interpretation  Date/Time:  Tuesday September 12 2020 01:55:16 EDT Ventricular Rate:  81 PR Interval:  222 QRS Duration: 129 QT Interval:  421 QTC Calculation: 489 R Axis:   -33 Text Interpretation: Sinus rhythm Atrial premature complexes Borderline prolonged PR interval Left bundle branch block Confirmed by [id number] (Zadie Rhine) on 09/12/2020 1:59:27 AM  Radiology DG Ankle Complete Left  Result Date: 09/11/2020 CLINICAL DATA:  Trip and fall with left ankle pain, initial encounter EXAM: LEFT ANKLE  COMPLETE - 3+ VIEW COMPARISON:  None. FINDINGS: Oblique fracture through the distal fibular metaphysis is noted. Lateral displacement of the talus with respect to the tibia is noted measuring at least 2.3 cm. The talus is mildly posteriorly displaced as well. No definitive distal tibial fracture is seen. Mild tarsal degenerative changes are noted. IMPRESSION: Distal fibular fracture with lateral subluxation/dislocation of the talus with respect to the distal tibia. Electronically Signed   By: 11/12/2020 M.D.   On: 09/11/2020 23:38   DG Chest Port 1 View  Result Date: 09/12/2020 CLINICAL DATA:  Shortness of breath history of left ankle fracture EXAM: PORTABLE CHEST 1 VIEW COMPARISON:  12/06/2014 FINDINGS: Cardiac shadow is mildly enlarged but accentuated by the portable technique. Aortic calcifications are noted.  Bibasilar atelectatic changes are seen. No confluent infiltrate or effusion is noted. No bony abnormality is seen. IMPRESSION: Bibasilar atelectatic changes as described. Electronically Signed   By: Alcide Clever M.D.   On: 09/12/2020 00:21   DG Ankle Left Port  Result Date: 09/12/2020 CLINICAL DATA:  Status post reduction EXAM: PORTABLE LEFT ANKLE - 2 VIEW COMPARISON:  Films from the previous day. FINDINGS: There has been some reduction with improved positioning of the talus although there remains significant lateral displacement. Distal fibular fracture is again seen. A few small bony densities are noted consistent with avulsions in the tibiotalar space, not well visualized on prior exam. IMPRESSION: Interval reduction although persistent lateral displacement of the talus with respect to the distal tibia is seen. Electronically Signed   By: Alcide Clever M.D.   On: 09/12/2020 01:44    Procedures .Sedation  Date/Time: 09/12/2020 12:31 AM Performed by: Zadie Rhine, MD Authorized by: Zadie Rhine, MD   Consent:    Consent obtained:  Written   Consent given by:  Patient Universal  protocol:    Immediately prior to procedure, a time out was called: yes     Patient identity confirmed:  Arm band and provided demographic data Indications:    Procedure performed:  Fracture reduction   Procedure necessitating sedation performed by:  Physician performing sedation Pre-sedation assessment:    Time since last food or drink:  4   ASA classification: class 2 - patient with mild systemic disease     Mouth opening:  3 or more finger widths   Mallampati score:  II - soft palate, uvula, fauces visible   Neck mobility: reduced     Pre-sedation assessments completed and reviewed: airway patency and mental status     Pre-sedation assessment completed:  09/12/2020 12:32 AM Immediate pre-procedure details:    Reassessment: Patient reassessed immediately prior to procedure     Reviewed: vital signs     Verified: bag valve mask available, emergency equipment available, IV patency confirmed and oxygen available   Procedure details (see MAR for exact dosages):    Preoxygenation:  Nasal cannula   Sedation:  Propofol   Intended level of sedation: deep   Intra-procedure monitoring:  Blood pressure monitoring, cardiac monitor, continuous capnometry, continuous pulse oximetry, frequent LOC assessments and frequent vital sign checks   Intra-procedure events: none     Total Provider sedation time (minutes):  16 Post-procedure details:    Post-sedation assessment completed:  09/12/2020 1:45 AM   Attendance: Constant attendance by certified staff until patient recovered     Recovery: Patient returned to pre-procedure baseline     Post-sedation assessments completed and reviewed: mental status and respiratory function     Patient is stable for discharge or admission: yes     Procedure completion:  Tolerated well, no immediate complications .Ortho Injury Treatment  Date/Time: 09/12/2020 1:15 AM Performed by: Zadie Rhine, MD Authorized by: Zadie Rhine, MD   Consent:    Consent  obtained:  Written   Consent given by:  Patient   Risks discussed:  Stiffness and irreducible dislocationInjury location: ankle Location details: left ankle Injury type: fracture-dislocation Pre-procedure neurovascular assessment: neurovascularly intact Pre-procedure distal perfusion: normal Pre-procedure neurological function: normal Pre-procedure range of motion: reduced  Patient sedated: Yes. Refer to sedation procedure documentation for details of sedation. Manipulation performed: yes Reduction successful: yes X-ray confirmed reduction: yes Immobilization: splint Splint type: short leg and ankle stirrup Splint Applied by: ED Provider Supplies used: Ortho-Glass and cotton padding  Post-procedure neurovascular assessment: post-procedure neurovascularly intact Post-procedure distal perfusion: normal Post-procedure neurological function: normal     Medications Ordered in ED Medications  sodium chloride 0.9 % bolus 500 mL (has no administration in time range)  fentaNYL (SUBLIMAZE) injection 50 mcg (50 mcg Intravenous Given 09/11/20 2356)  ceFAZolin (ANCEF) IVPB 2g/100 mL premix (0 g Intravenous Stopped 09/12/20 0032)  Tdap (BOOSTRIX) injection 0.5 mL (0.5 mLs Intramuscular Given 09/11/20 2355)  propofol (DIPRIVAN) 10 mg/mL bolus/IV push 40 mg (40 mg Intravenous Given 09/12/20 0110)    ED Course  I have reviewed the triage vital signs and the nursing notes.  Pertinent labs & imaging results that were available during my care of the patient were reviewed by me and considered in my medical decision making (see chart for details).    MDM Rules/Calculators/A&P                          0015am I discussed the case with Dr. Fuller Canada.  We discussed x-ray findings as well as the wound over the medial malleolus On further review, this does not appear to be an open wound and is more of a pressure injury.  There is no active bleeding, there is no bone exposed.  He was given IV  antibiotics and tetanus initially when there was an initial thought this could be an open fracture After discussion with Dr. Romeo Apple, plan will be to reduce the injury and then admit to the hospitalist service in anticipation for repair.  Patient will have very difficult time going home with his injury  2:02 AM Patient has tolerated the reduction very well.  He is awake and alert this time.  Some improvement noted on reduction. I discussed the case with his daughter [name] over the phone. Patient did have hypoxia of unclear etiology on arrival, we are now scaling back his oxygen.  His initial chest x-ray did show some atelectasis Patient has no other acute complaints at this time. D/w Dr Arville Care for admission  Final Clinical Impression(s) / ED Diagnoses Final diagnoses:  Pain  Closed fracture of left ankle, initial encounter  AKI (acute kidney injury) [facility])    Rx / DC Orders ED Discharge Orders     None        Zadie Rhine, MD 09/12/20 0206

## 2020-09-12 NOTE — ED Notes (Signed)
Call placed to Dept 300 to call report. Spoke to charge RN who stated that the nurse is in a patient room and she would call me back in a few minutes.

## 2020-09-12 NOTE — Progress Notes (Signed)
Gave patient incentive spirometer.  Explained how to use it and how often.  Patient was able to do X10 with good patient effort.  IS left at bedside with patient.

## 2020-09-12 NOTE — H&P (Signed)
[place]   PATIENT NAME: Angel Norman    MR#:  [id number]  DATE OF BIRTH:  1932/07/19  DATE OF ADMISSION:  09/11/2020  PRIMARY CARE PHYSICIAN: Lupita Raider, MD   Patient is coming from: Home  REQUESTING/REFERRING PHYSICIAN: Zadie Rhine, MD   CHIEF COMPLAINT:   Chief Complaint  Patient presents with  . Ankle Pain    HISTORY OF PRESENT ILLNESS:  Angel Norman is a 87 y.o. 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  He denied any paresthesias or focal muscle weakness.  No head injuries or other injuries.  He had nausea and vomiting earlier.  He had headache in the ER without dizziness or blurred vision.  He denied presyncope or syncope.  No chest pain or palpitations.  No cough or wheezing or dyspnea.  No dysuria, oliguria or hematuria or flank pain.  ED Course: When he came to the ER his vital signs were within normal except approximately 84% on room air that was up to 93% on 5 L O2 by nasal cannula then 96% on 3 L.  Labs revealed blood glucose of 152, a creatinine 1.46 and BUN of 17 compared to 1 on 05/09/2018.  CBC showed cytosis of 12.12 and mild anemia and neutrophilia.  Influenza antigens and COVID-19 PCR came back negative.   EKG as reviewed by me : Showed sinus rhythm with a rate of 81 with PACs and T wave inversion laterally and slightly widened QRS Imaging: Portable chest ray showed bibasilar atelectasis with no acute cardiopulmonary disease. Left ankle x-ray showed distal fibular fracture with lateral subluxation/dislocation of the talus with respect to the distal tibia.  The patient was given 50 mcg of IV fentanyl and 2 mg IV morphine sulfate, 40 mg of IV propofol and 500 mill IV normal saline as well as Tdap and 40 mg of IV  Protonix.  Dr. Romeo Apple was notified and is aware about the patient.  He will be admitted to a medical-surgical bed for further evaluation and management. PAST MEDICAL HISTORY:   Past Medical History:  Diagnosis Date  . Arthritis   . GERD (gastroesophageal reflux disease)   . Hyperlipidemia   . Rib fractures 12/06/2014    PAST SURGICAL HISTORY:   Past Surgical History:  Procedure Laterality Date  . ESOPHAGOGASTRODUODENOSCOPY (EGD) WITH PROPOFOL N/A 05/08/2018   Procedure: ESOPHAGOGASTRODUODENOSCOPY (EGD) WITH PROPOFOL;  Surgeon: Malissa Hippo, MD;  Location: [facility]ITE;  Service: Endoscopy;  Laterality: N/A;  . goiter removal    . TOTAL KNEE ARTHROPLASTY Left 08/18/2012   Dr Lajoyce Corners  . TOTAL KNEE ARTHROPLASTY Left 08/19/2012   Procedure: TOTAL KNEE ARTHROPLASTY;  Surgeon: Nadara Mustard, MD;  Location: [facility] OR;  Service: Orthopedics;  Laterality: Left;  Left Total Knee Arthroplasty    SOCIAL HISTORY:   Social History   Tobacco Use  . Smoking status: Never  . Smokeless tobacco: Never  Substance Use Topics  . Alcohol use: No    FAMILY HISTORY:  No family history on file.  He denies any familial diseases.  DRUG ALLERGIES:  No Known Allergies  REVIEW OF SYSTEMS:   ROS As per history of present illness. All pertinent systems were reviewed above. Constitutional, HEENT, cardiovascular, respiratory, GI, GU, musculoskeletal, neuro, psychiatric, endocrine,  integumentary and hematologic systems were reviewed and are otherwise negative/unremarkable except for positive findings mentioned above in the HPI.   MEDICATIONS AT HOME:   Prior to Admission medications   Medication Sig Start Date End Date Taking? Authorizing Provider  meloxicam (MOBIC) 7.5 MG tablet Take 7.5 mg by mouth as needed for pain.    [provider]  naproxen (NAPROSYN) 500 MG tablet Take 500 mg by mouth as needed. Per Daughter he only takes as needed.     [provider]  pantoprazole (PROTONIX)  40 MG tablet Take 1 tablet (40 mg total) by mouth 2 (two) times daily. Needs to use twice a day for 12 weeks for gastritis 05/09/18 05/09/19  Myrtie Neither, MD  pravastatin (PRAVACHOL) 80 MG tablet Take 80 mg by mouth daily.    [provider]  tamsulosin (FLOMAX) 0.4 MG CAPS capsule Take 0.4 mg by mouth daily.    [provider]      VITAL SIGNS:  Blood pressure 113/68, pulse 78, temperature (!) 97.5 F (36.4 C), resp. rate 20, height 6\' 3"  (1.905 m), weight (!) 137 kg, SpO2 96 %.  PHYSICAL EXAMINATION:  Physical Exam  GENERAL:  87 y.o.-year-old Caucasian male patient lying in the bed with no distress from pain in the left ankle. EYES: Pupils equal, round, reactive to light and accommodation. No scleral icterus. Extraocular muscles intact.  HEENT: Head atraumatic, normocephalic. Oropharynx and nasopharynx clear.  NECK:  Supple, no jugular venous distention. No thyroid enlargement, no tenderness.  LUNGS: Normal breath sounds bilaterally, no wheezing, rales,rhonchi or crepitation. No use of accessory muscles of respiration.  CARDIOVASCULAR: Regular rate and rhythm, S1, S2 normal. No murmurs, rubs, or gallops.  ABDOMEN: Soft, nondistended, nontender. Bowel sounds present. No organomegaly or mass.  EXTREMITIES: No pedal edema, cyanosis, or clubbing. Musculoskeletal: Left ankle is in splint. NEUROLOGIC: Cranial nerves II through XII are intact. Muscle strength 5/5 in all extremities. Sensation intact. Gait not checked.  PSYCHIATRIC: The patient is alert and oriented x 3.  Normal affect and good eye contact. SKIN: No obvious rash, lesion, or ulcer.   LABORATORY PANEL:   CBC Recent Labs  Lab 09/12/20 0002  WBC 12.2*  HGB 12.5*  HCT 41.5  PLT 226   ------------------------------------------------------------------------------------------------------------------  Chemistries  Recent Labs  Lab 09/12/20 0002  NA 139  K 4.2  CL 104  CO2 27  GLUCOSE 152*  BUN 17   CREATININE 1.46*  CALCIUM 8.7*   ------------------------------------------------------------------------------------------------------------------  Cardiac Enzymes No results for input(s): TROPONINI in the last 168 hours. ------------------------------------------------------------------------------------------------------------------  RADIOLOGY:  DG Ankle Complete Left  Result Date: 09/11/2020 CLINICAL DATA:  Trip and fall with left ankle pain, initial encounter EXAM: LEFT ANKLE COMPLETE - 3+ VIEW COMPARISON:  None. FINDINGS: Oblique fracture through the distal fibular metaphysis is noted. Lateral displacement of the talus with respect to the tibia is noted measuring at least 2.3 cm. The talus is mildly posteriorly displaced as well. No definitive distal tibial fracture is seen. Mild tarsal degenerative changes are noted. IMPRESSION: Distal fibular fracture with lateral subluxation/dislocation of the talus with respect to the distal tibia. Electronically Signed   By: 11/12/2020 M.D.   On: 09/11/2020 23:38   DG Chest Port 1 View  Result Date: 09/12/2020 CLINICAL DATA:  Shortness of breath history of left ankle fracture EXAM: PORTABLE CHEST 1 VIEW COMPARISON:  12/06/2014 FINDINGS: Cardiac shadow is mildly enlarged but accentuated by the portable technique. Aortic calcifications are noted. Bibasilar atelectatic changes  are seen. No confluent infiltrate or effusion is noted. No bony abnormality is seen. IMPRESSION: Bibasilar atelectatic changes as described. Electronically Signed   By: Alcide Clever M.D.   On: 09/12/2020 00:21   DG Ankle Left Port  Result Date: 09/12/2020 CLINICAL DATA:  Status post reduction EXAM: PORTABLE LEFT ANKLE - 2 VIEW COMPARISON:  Films from the previous day. FINDINGS: There has been some reduction with improved positioning of the talus although there remains significant lateral displacement. Distal fibular fracture is again seen. A few small bony densities are noted  consistent with avulsions in the tibiotalar space, not well visualized on prior exam. IMPRESSION: Interval reduction although persistent lateral displacement of the talus with respect to the distal tibia is seen. Electronically Signed   By: Alcide Clever M.D.   On: 09/12/2020 01:44      IMPRESSION AND PLAN:  Active Problems:   Closed fracture dislocation of left ankle  1.  Closed fracture dislocation of the left ankle. - The patient will be admitted to a medical-surgical bed. - Pain management will be provided. - Orthopedic consultation will be obtained. - Dr. Romeo Apple was notified and is aware about the patient. - The patient will be NPO.  2.  Acute kidney injury, likely prerenal. - The patient will be placed on hydration with IV normal saline. - We will follow BMP. - We will avoid nephrotoxins.  3.  Hypoxia likely associated with bibasal atelectasis. - The patient denies any history of obstructive sleep apnea and given his otherwise normal chest x-ray, we will obtain a D-dimer level and if elevated a chest CTA. - Incentive spirometry will be utilized  4.  Type II diabetes mellitus. - The patient will be placed on supplement coverage with NovoLog and will continue his Jardiance while holding off metformin.  5.  Dyslipidemia. - We will continue statin therapy.  6.  GERD. - We will continue PPI therapy.  7.  BPH. - We will continue his Flomax..  8.  Osteoarthritis. - We will hold off his Naprosyn.  DVT prophylaxis: SCDs.  Medical prophylaxis is being postponed till postoperative period Code Status: full code. Family Communication:  The plan of care was discussed in details with the patient (who requested no other family members to be notified at this time.). I answered all questions. The patient agreed to proceed with the above mentioned plan. Further management will depend upon hospital course. Disposition Plan: Back to previous home environment Consults called: Orthopedic  consult.. All the records are reviewed and case discussed with ED provider.  Status is: Inpatient  Remains inpatient appropriate because:Ongoing active pain requiring inpatient pain management, Ongoing diagnostic testing needed not appropriate for outpatient work up, Unsafe d/c plan, IV treatments appropriate due to intensity of illness or inability to take PO, and Inpatient level of care appropriate due to severity of illness  Dispo: The patient is from: Home              Anticipated d/c is to: Home              Patient currently is not medically stable to d/c.   Difficult to place patient No  TOTAL TIME TAKING CARE OF THIS PATIENT: 55 minutes.    Hannah Beat M.D on 09/12/2020 at 1:57 AM  [facility]   From 7 PM-7 AM, contact night-coverage [email or web address]  CC: Primary care physician; Lupita Raider, MD

## 2020-09-12 NOTE — Progress Notes (Signed)
Patient admitted to the hospital earlier this morning by Dr. Arville Care  Patient seen and examined.  Denies any shortness of breath.  No cough.  He denies any wheezing at this time, but his daughter does report that she has noted some wheezing lately.  Has some burning pain in his left ankle that is currently in a splint.  Lungs are clear to auscultation bilaterally.  Assessment/plan  Closed left ankle fracture/dislocation -Seen by orthopedics with plan for operative management on 7/13 -N.p.o. after midnight  Acute respiratory failure with hypoxia -Patient was started on supplemental oxygen, currently on 3 L -Lungs are clear to auscultation bilaterally -Chest x-ray shows atelectasis, no evidence of pneumonia -D-dimer elevated -Check CT chest -Try and wean off oxygen as tolerated  Acute kidney injury -Creatinine 1.46 on admission -Currently improved to 1.2 with IV fluids -He is chronically on Naprosyn for arthritis, will hold -Continue to monitor  Type 2 diabetes -Chronically on Jardiance and metformin -Metformin is currently on hold -He is on sliding scale insulin -Continue to monitor  Hyperlipidemia -Continue statin continue statin  GERD -Continue PPI  BPH -Continue Flomax  [employer]

## 2020-09-12 NOTE — ED Notes (Signed)
Patient aware of npo status and verbalized understanding.

## 2020-09-12 NOTE — H&P (View-Only) (Signed)
Patient ID: Angel [place], male   DOB: 10/13/1932, 87 y.o.   MRN: [id number]  LEFT ANKLE FRACTURE   Past Medical History:  Diagnosis Date   Arthritis    GERD (gastroesophageal reflux disease)    Hyperlipidemia    Rib fractures 12/06/2014    CAME IN HYPOXIC !  SURGERY DAY PENDING  

## 2020-09-13 ENCOUNTER — Encounter (HOSPITAL_COMMUNITY)
Admission: EM | Disposition: A | Discharge status: Other Acute Inpatient Hospital | Payer: Self-pay | Source: Home / Self Care | Attending: Internal Medicine

## 2020-09-13 ENCOUNTER — Inpatient Hospital Stay (HOSPITAL_COMMUNITY): Payer: Medicare PPO | Admitting: Certified Registered Nurse Anesthetist

## 2020-09-13 ENCOUNTER — Inpatient Hospital Stay (HOSPITAL_COMMUNITY): Payer: Medicare PPO

## 2020-09-13 ENCOUNTER — Encounter (HOSPITAL_COMMUNITY): Payer: Self-pay | Admitting: Family Medicine

## 2020-09-13 DIAGNOSIS — S82892A Other fracture of left lower leg, initial encounter for closed fracture: Secondary | ICD-10-CM

## 2020-09-13 DIAGNOSIS — W19XXXA Unspecified fall, initial encounter: Secondary | ICD-10-CM

## 2020-09-13 HISTORY — PX: ORIF ANKLE FRACTURE: SHX5408

## 2020-09-13 LAB — CBC
HCT: 38.4 % — ABNORMAL LOW (ref 39.0–52.0)
HCT: 39.5 % (ref 39.0–52.0)
Hemoglobin: 11.2 g/dL — ABNORMAL LOW (ref 13.0–17.0)
Hemoglobin: 11.5 g/dL — ABNORMAL LOW (ref 13.0–17.0)
MCH: 26.5 pg (ref 26.0–34.0)
MCH: 26.3 pg (ref 26.0–34.0)
MCHC: 29.2 g/dL — ABNORMAL LOW (ref 30.0–36.0)
MCHC: 29.1 g/dL — ABNORMAL LOW (ref 30.0–36.0)
MCV: 91 fL (ref 80.0–100.0)
MCV: 90.4 fL (ref 80.0–100.0)
Platelets: 212 10*3/uL (ref 150–400)
Platelets: 196 10*3/uL (ref 150–400)
RBC: 4.22 MIL/uL (ref 4.22–5.81)
RBC: 4.37 MIL/uL (ref 4.22–5.81)
RDW: 19 % — ABNORMAL HIGH (ref 11.5–15.5)
RDW: 18.6 % — ABNORMAL HIGH (ref 11.5–15.5)
WBC: 7.9 10*3/uL (ref 4.0–10.5)
WBC: 9.8 10*3/uL (ref 4.0–10.5)
nRBC: 0 % (ref 0.0–0.2)
nRBC: 0 % (ref 0.0–0.2)

## 2020-09-13 LAB — RENAL FUNCTION PANEL
Albumin: 3.4 g/dL — ABNORMAL LOW (ref 3.5–5.0)
Anion gap: 6 (ref 5–15)
BUN: 15 mg/dL (ref 8–23)
CO2: 26 mmol/L (ref 22–32)
Calcium: 7.5 mg/dL — ABNORMAL LOW (ref 8.9–10.3)
Chloride: 103 mmol/L (ref 98–111)
Creatinine, Ser: 1.29 mg/dL — ABNORMAL HIGH (ref 0.61–1.24)
GFR, Estimated: 54 mL/min — ABNORMAL LOW (ref >60)
Glucose, Bld: 116 mg/dL — ABNORMAL HIGH (ref 70–99)
Phosphorus: 2.8 mg/dL (ref 2.5–4.6)
Potassium: 4.2 mmol/L (ref 3.5–5.1)
Sodium: 135 mmol/L (ref 135–145)

## 2020-09-13 LAB — CREATININE, SERUM
Creatinine, Ser: 1.14 mg/dL (ref 0.61–1.24)
GFR, Estimated: >60 mL/min (ref >60)

## 2020-09-13 LAB — GLUCOSE, CAPILLARY: Glucose-Capillary: 87 mg/dL (ref 70–99)

## 2020-09-13 IMAGING — RF DG ANKLE 2V *L*
1 series · 7 of 7 positions shown · non-contrast
Comparison: Radiographs [DATE].

CLINICAL DATA: Left ankle ORIF.

EXAM:
DG C-ARM 1-60 MIN; LEFT ANKLE - 2 VIEW

[Series 1: run · 7 of 7 slices shown]
[im 1/7]
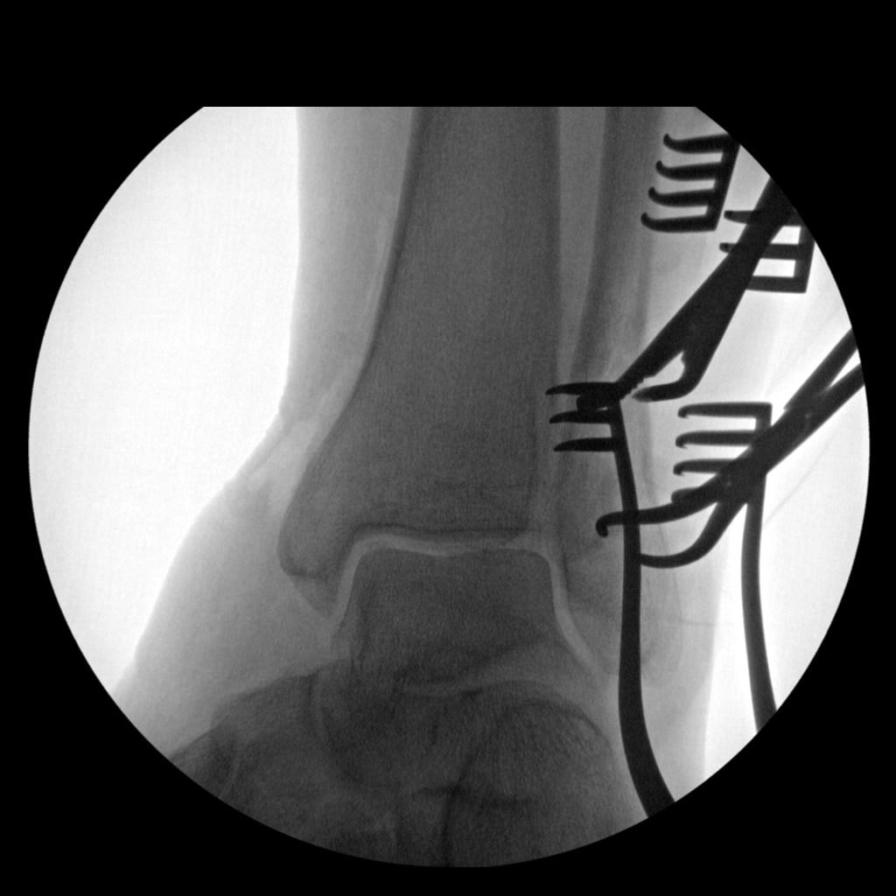
[im 2/7]
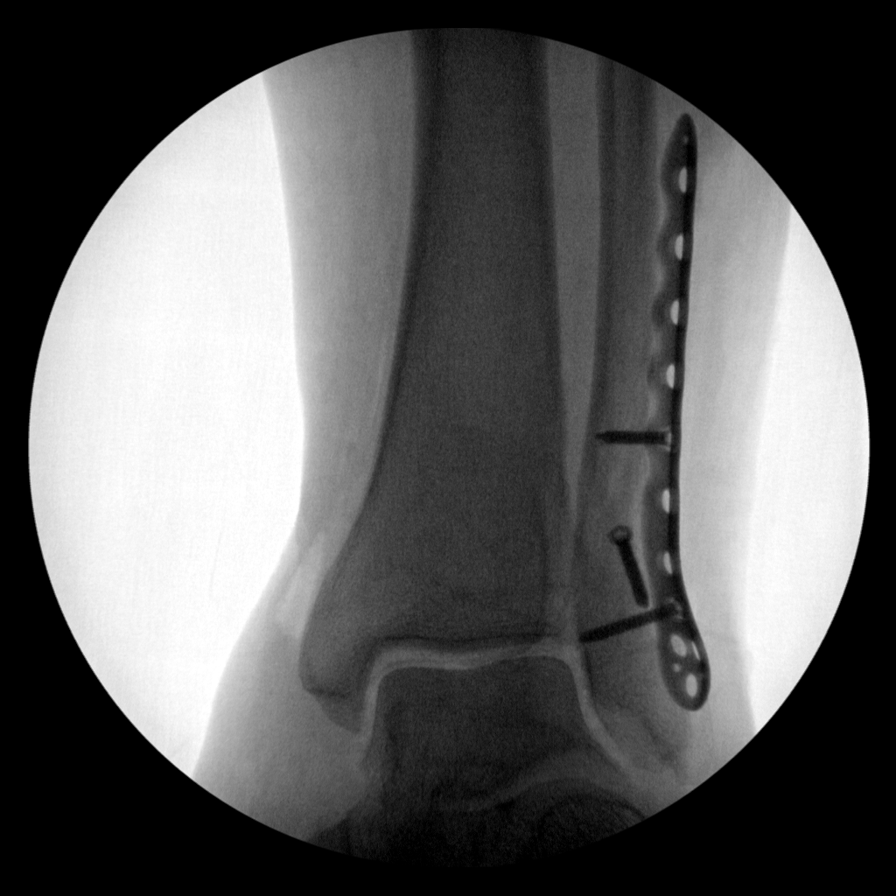
[im 3/7]
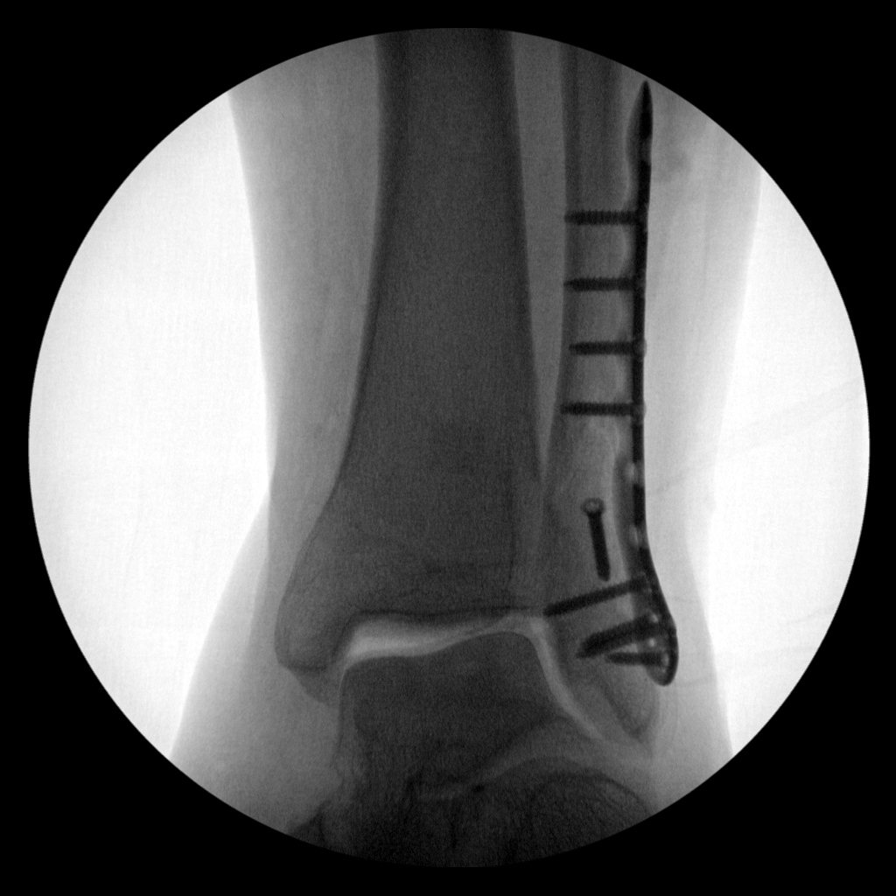
[im 4/7]
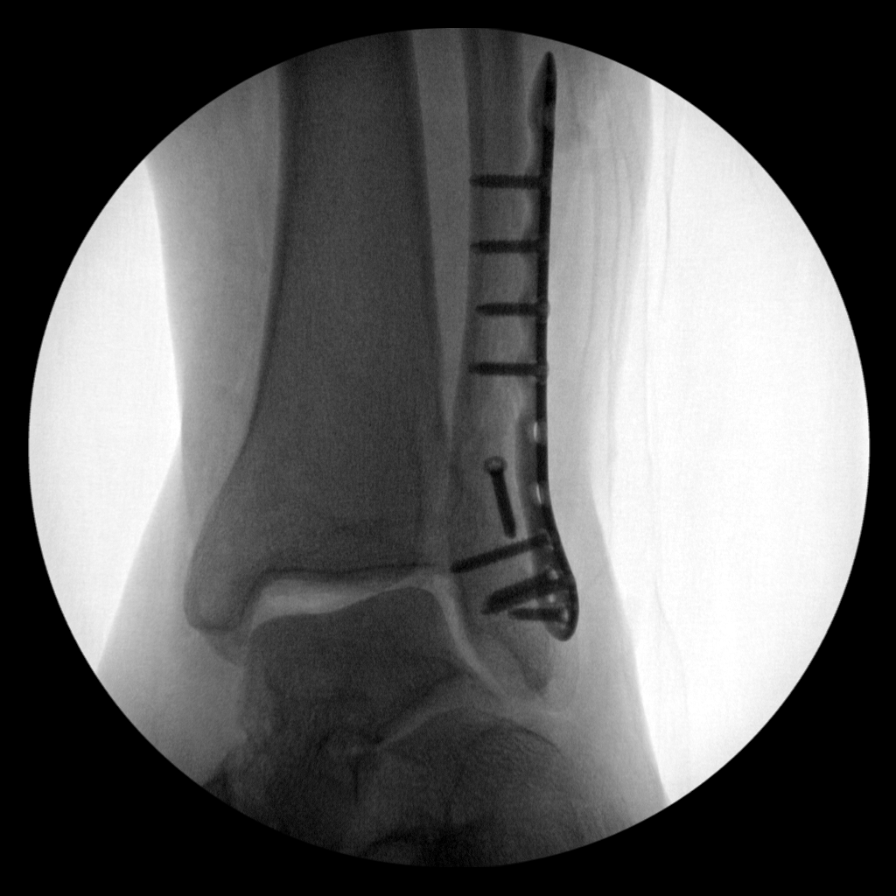
[im 5/7]
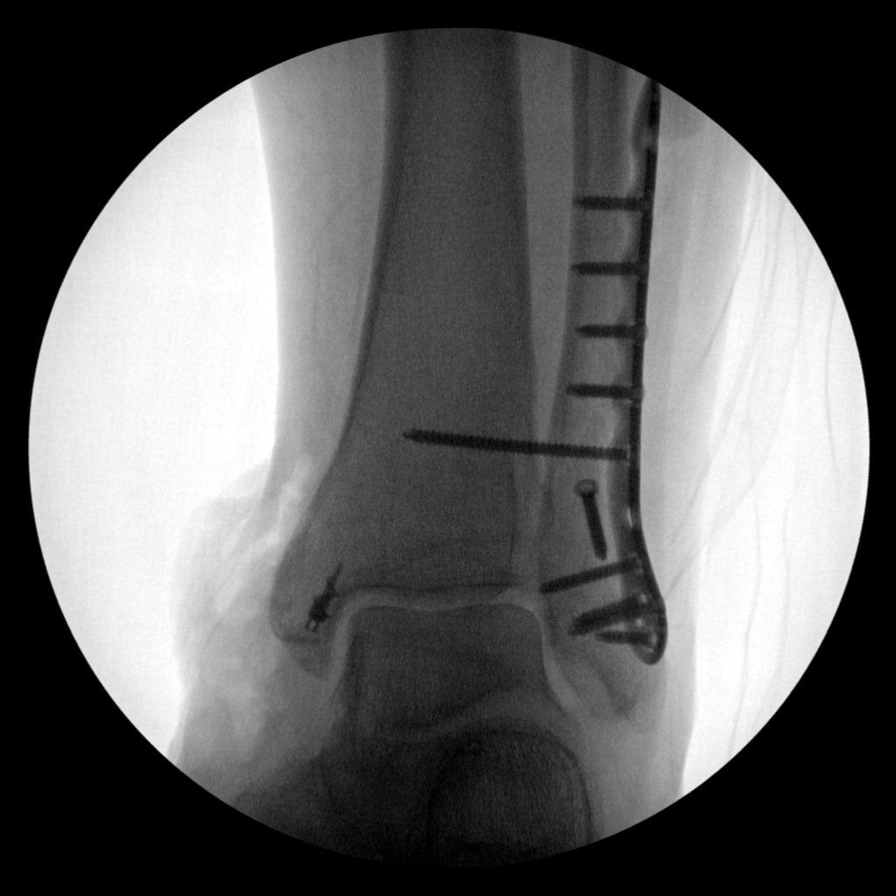
[im 6/7]
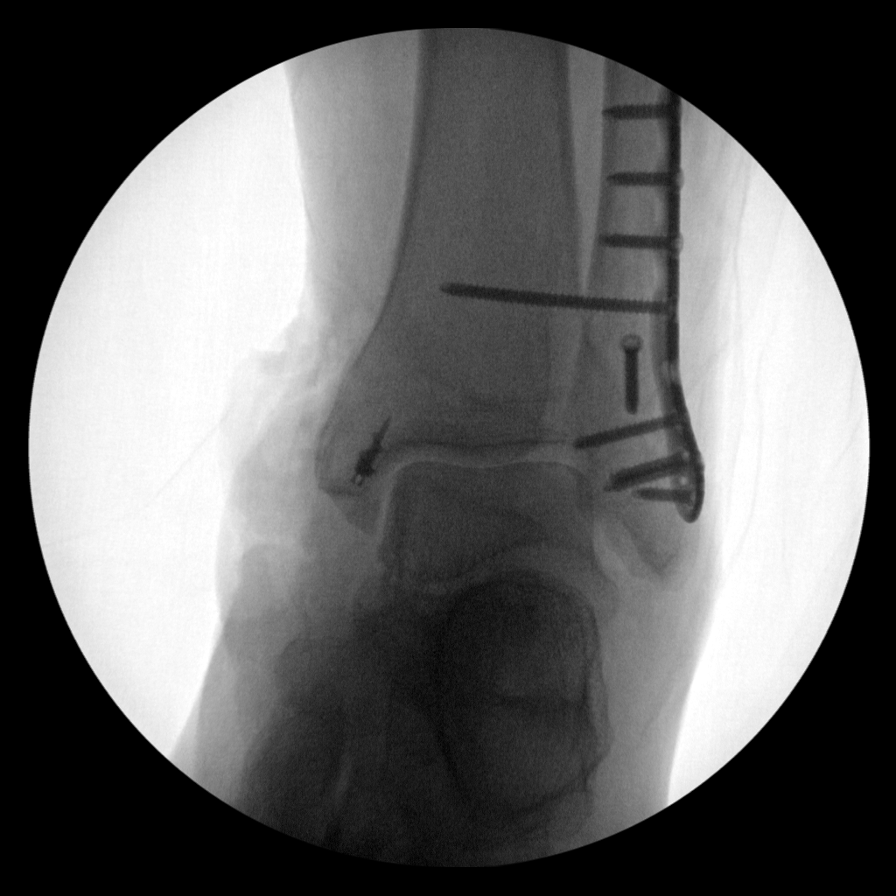
[im 7/7]
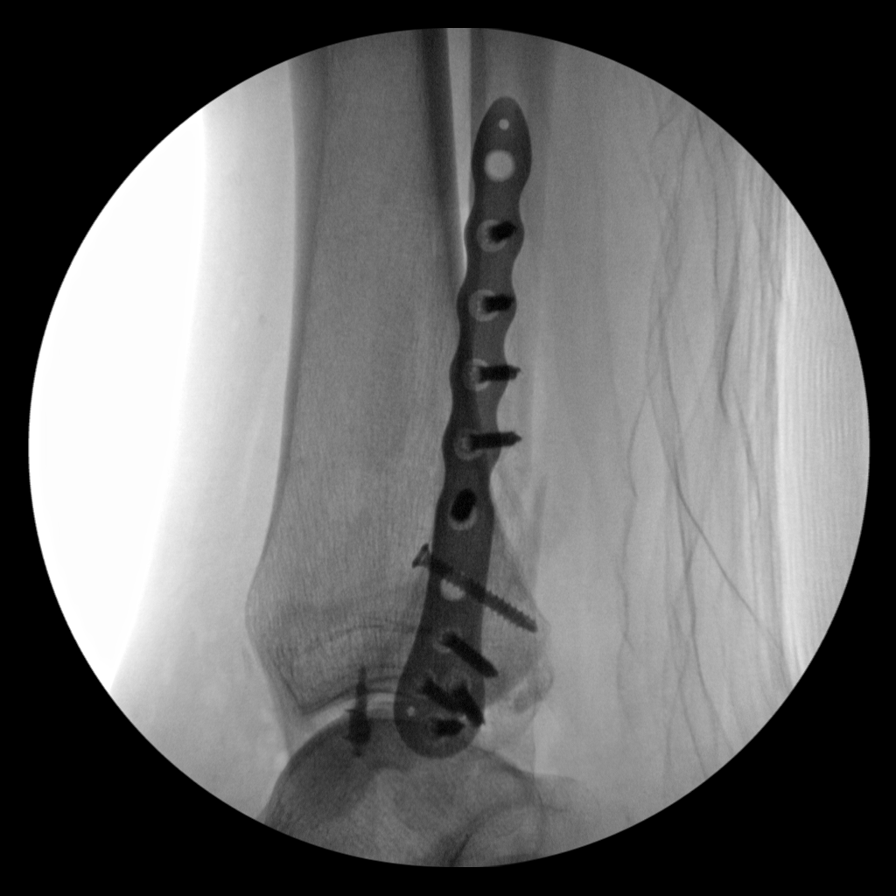

[7 of 7 positions shown; findings below may reference images not displayed]

FINDINGS: C-arm fluoroscopy was provided in the operating [HOSPITAL] seconds of
fluoroscopy time.

7 spot fluoroscopic images are submitted. These demonstrate open
reduction and internal fixation of the distal fibular fracture using
a lateral plate and screws. One of the screws protrudes through the
distal syndesmosis. There is an anchor screw within the medial
malleolus. There is near anatomic reduction of the fractures, and no
residual widening of the ankle mortise or talar subluxation. No
complications identified.
IMPRESSION: Intraoperative views during left ankle ORIF. No demonstrated
complications.

## 2020-09-13 IMAGING — RF DG C-ARM 1-60 MIN
1 series · 7 of 7 positions shown · non-contrast
Comparison: Radiographs [DATE].

CLINICAL DATA: Left ankle ORIF.

EXAM:
DG C-ARM 1-60 MIN; LEFT ANKLE - 2 VIEW

[Series 1: run · 7 of 7 slices shown]
[im 1/7]
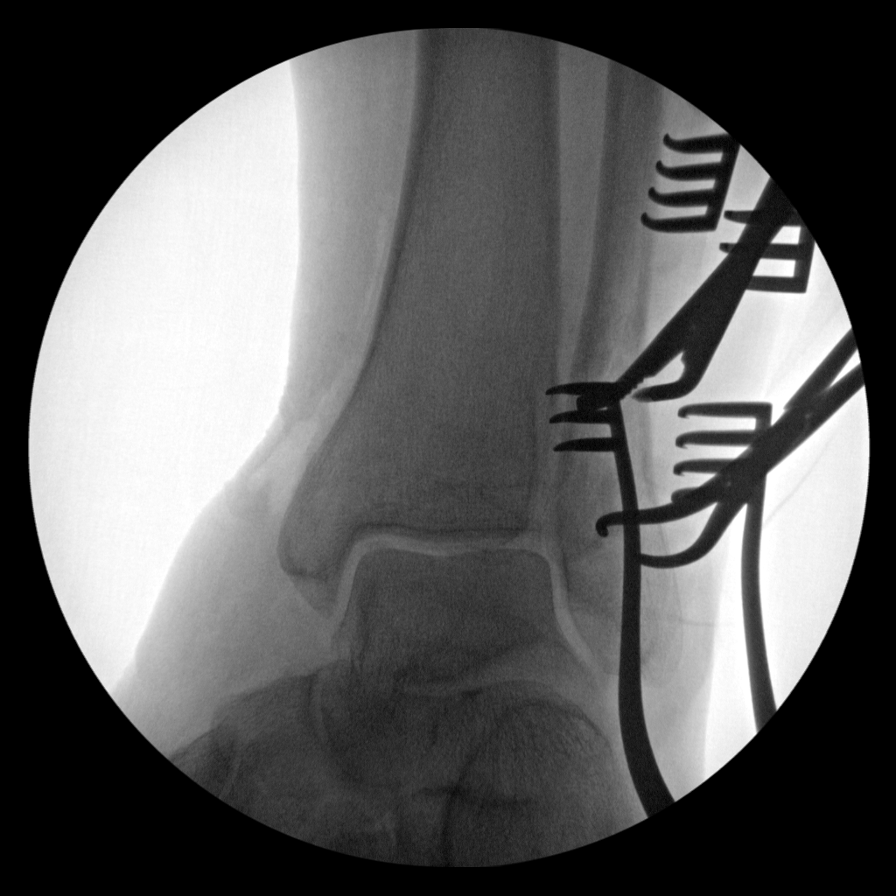
[im 2/7]
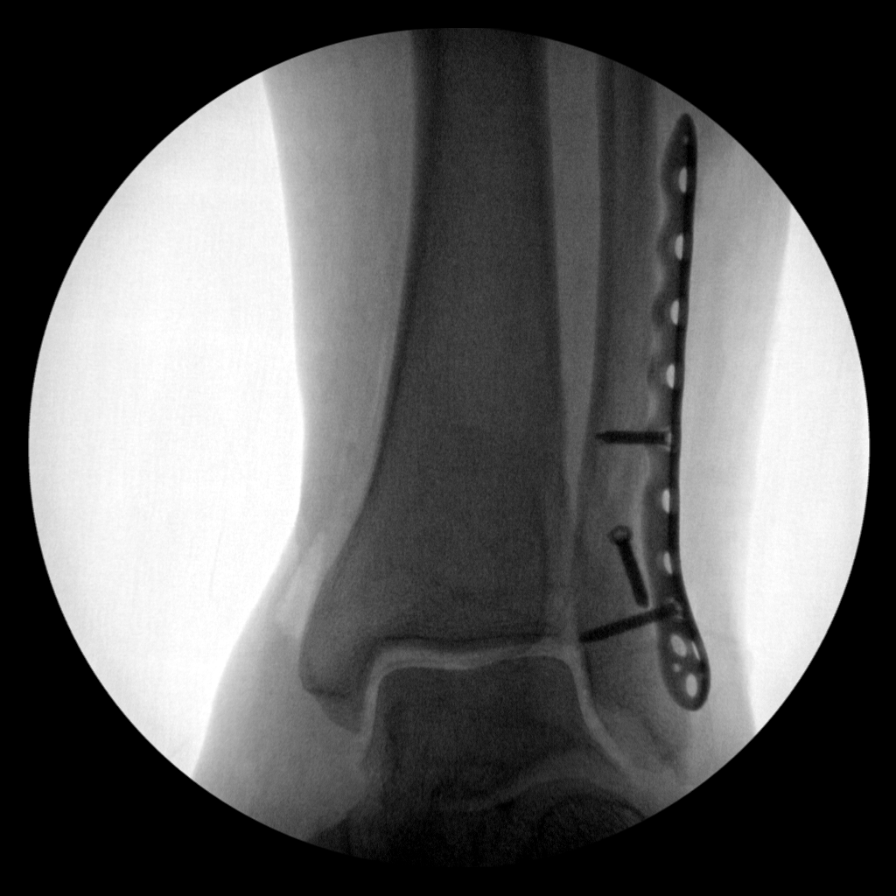
[im 3/7]
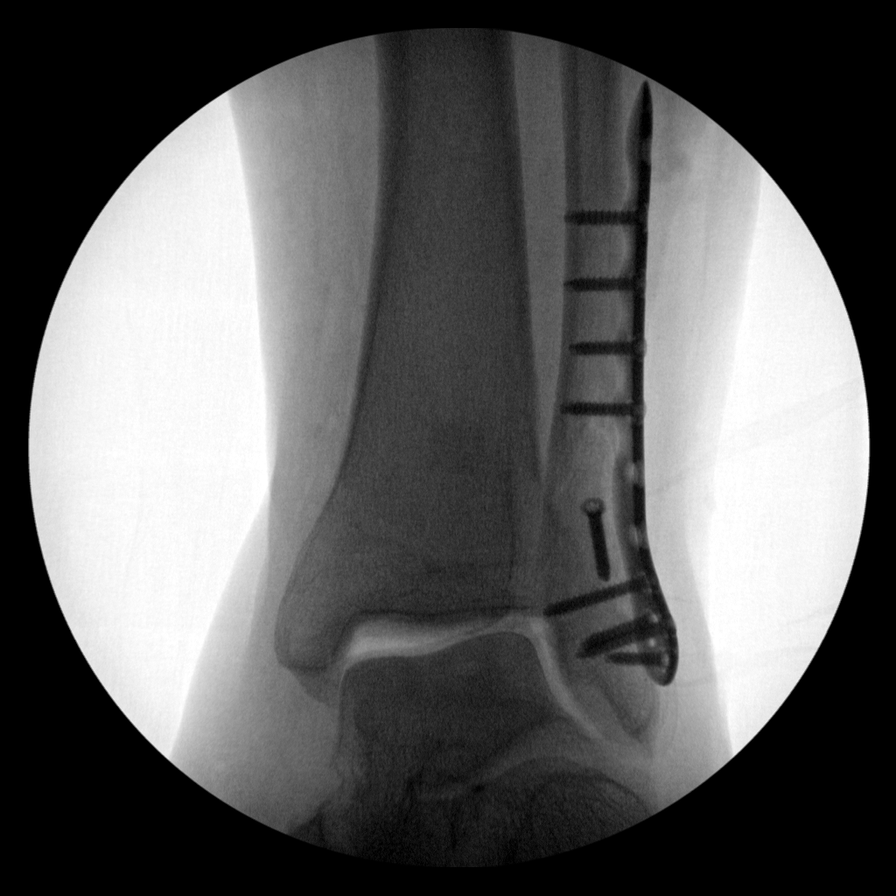
[im 4/7]
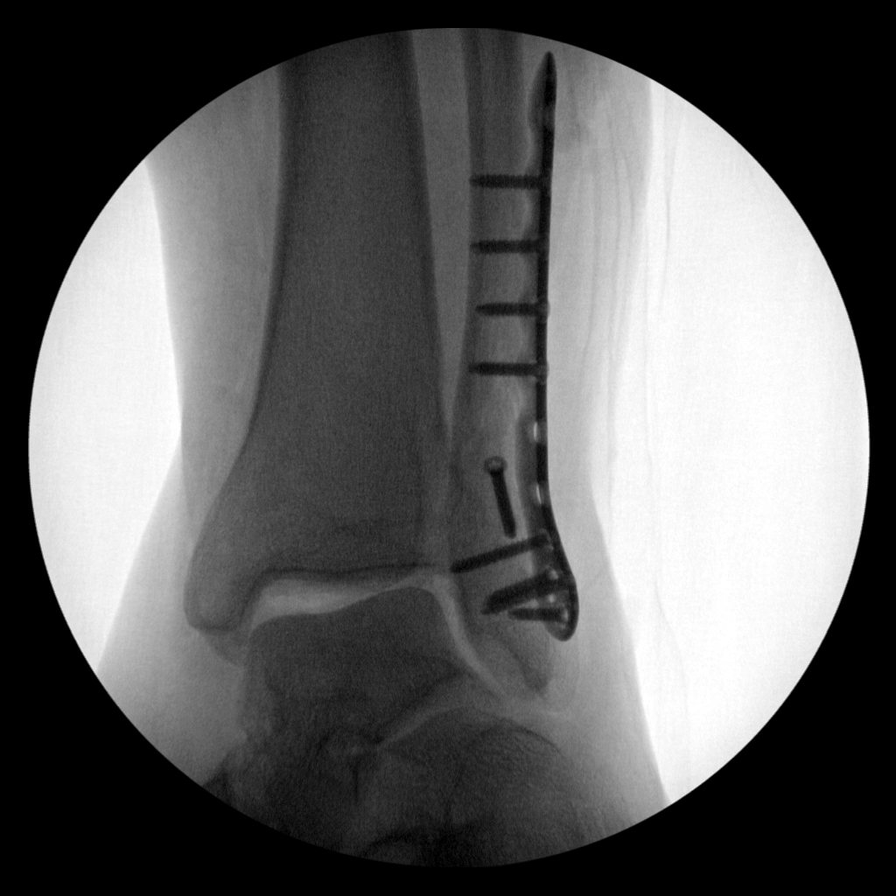
[im 5/7]
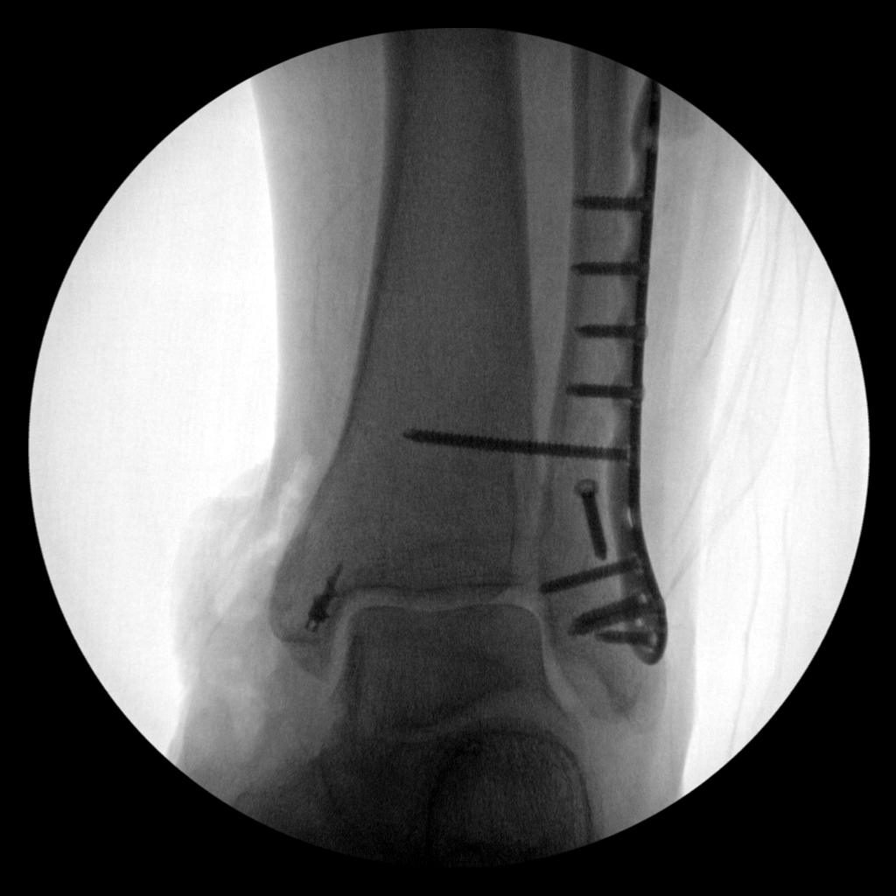
[im 6/7]
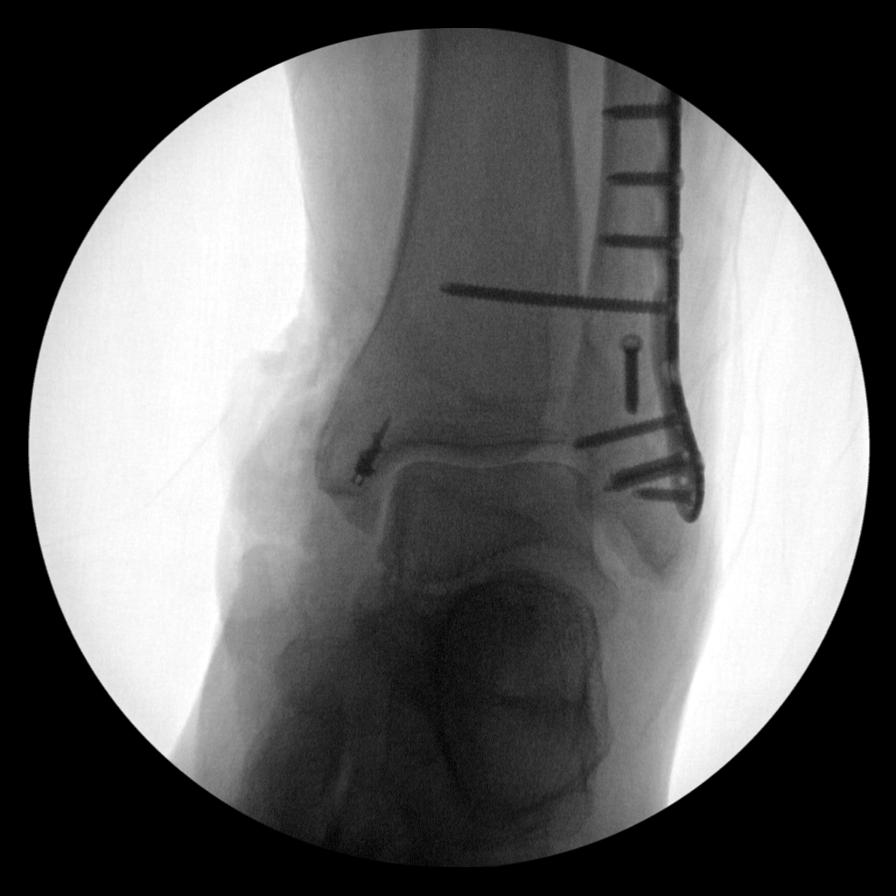
[im 7/7]
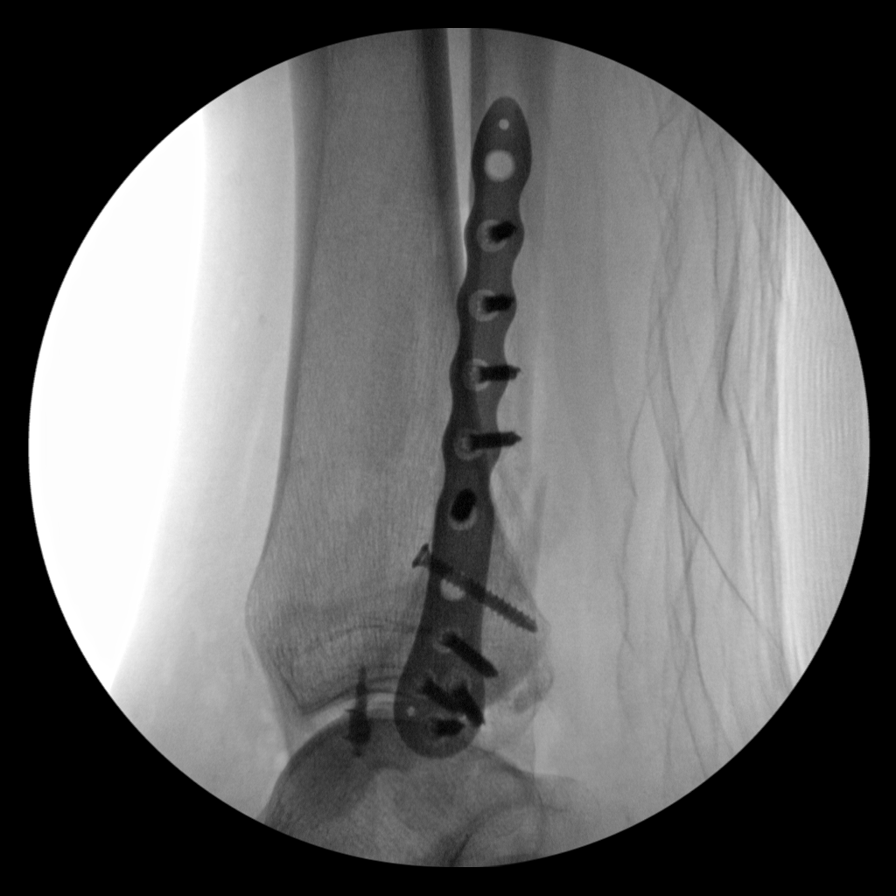

[7 of 7 positions shown; findings below may reference images not displayed]

FINDINGS: C-arm fluoroscopy was provided in the operating [HOSPITAL] seconds of
fluoroscopy time.

7 spot fluoroscopic images are submitted. These demonstrate open
reduction and internal fixation of the distal fibular fracture using
a lateral plate and screws. One of the screws protrudes through the
distal syndesmosis. There is an anchor screw within the medial
malleolus. There is near anatomic reduction of the fractures, and no
residual widening of the ankle mortise or talar subluxation. No
complications identified.
IMPRESSION: Intraoperative views during left ankle ORIF. No demonstrated
complications.

## 2020-09-13 SURGERY — OPEN REDUCTION INTERNAL FIXATION (ORIF) ANKLE FRACTURE
Anesthesia: Regional | Site: Ankle | Laterality: Left

## 2020-09-13 MED ORDER — CEFAZOLIN SODIUM-DEXTROSE 2-4 GM/100ML-% IV SOLN
2.0000 g | Freq: Four times a day (QID) | INTRAVENOUS | Status: AC
Start: 2020-09-13 — End: 2020-09-14
  Administered 2020-09-13 – 2020-09-14 (×3): 2 g via INTRAVENOUS
  Filled 2020-09-13 (×3): qty 100

## 2020-09-13 MED ORDER — ONDANSETRON HCL 4 MG/2ML IJ SOLN: 4.0000 mg | Freq: Four times a day (QID) | INTRAMUSCULAR | Status: DC | PRN

## 2020-09-13 MED ORDER — BUPIVACAINE-EPINEPHRINE (PF) 0.5% -1:200000 IJ SOLN
INTRAMUSCULAR | Status: AC
  Filled 2020-09-13: qty 60

## 2020-09-13 MED ORDER — HYDROCODONE-ACETAMINOPHEN 5-325 MG PO TABS
1.0000 | ORAL_TABLET | ORAL | Status: DC | PRN
  Administered 2020-09-13 – 2020-09-18 (×5): 2 via ORAL
  Filled 2020-09-13 (×5): qty 2

## 2020-09-13 MED ORDER — METHOCARBAMOL 1000 MG/10ML IJ SOLN
500.0000 mg | Freq: Four times a day (QID) | INTRAVENOUS | Status: DC | PRN
  Filled 2020-09-13: qty 5

## 2020-09-13 MED ORDER — LIDOCAINE HCL (CARDIAC) PF 50 MG/5ML IV SOSY
PREFILLED_SYRINGE | INTRAVENOUS | Status: DC | PRN
  Administered 2020-09-13 (×2): 3 mL via INTRAVENOUS

## 2020-09-13 MED ORDER — PHENYLEPHRINE HCL (PRESSORS) 10 MG/ML IV SOLN
INTRAVENOUS | Status: DC | PRN
  Administered 2020-09-13: 80 ug via INTRAVENOUS
  Administered 2020-09-13: 120 ug via INTRAVENOUS
  Administered 2020-09-13 (×2): 80 ug via INTRAVENOUS

## 2020-09-13 MED ORDER — FENTANYL CITRATE (PF) 100 MCG/2ML IJ SOLN: 25.0000 ug | INTRAMUSCULAR | Status: DC | PRN

## 2020-09-13 MED ORDER — METHOCARBAMOL 500 MG PO TABS: 500.0000 mg | ORAL_TABLET | Freq: Four times a day (QID) | ORAL | Status: DC | PRN

## 2020-09-13 MED ORDER — ONDANSETRON HCL 4 MG PO TABS: 4.0000 mg | ORAL_TABLET | Freq: Four times a day (QID) | ORAL | Status: DC | PRN

## 2020-09-13 MED ORDER — ACETAMINOPHEN 325 MG PO TABS: 325.0000 mg | ORAL_TABLET | Freq: Four times a day (QID) | ORAL | Status: DC | PRN

## 2020-09-13 MED ORDER — 0.9 % SODIUM CHLORIDE (POUR BTL) OPTIME
TOPICAL | Status: DC | PRN
  Administered 2020-09-13 (×2): 1000 mL

## 2020-09-13 MED ORDER — HYDROCODONE-ACETAMINOPHEN 7.5-325 MG PO TABS
1.0000 | ORAL_TABLET | ORAL | Status: DC | PRN
  Administered 2020-09-15 – 2020-09-16 (×3): 2 via ORAL
  Filled 2020-09-13 (×3): qty 2

## 2020-09-13 MED ORDER — MIDAZOLAM HCL 2 MG/2ML IJ SOLN
INTRAMUSCULAR | Status: AC
  Filled 2020-09-13: qty 2

## 2020-09-13 MED ORDER — ORAL CARE MOUTH RINSE: 15.0000 mL | Freq: Once | OROMUCOSAL | Status: AC

## 2020-09-13 MED ORDER — EMPAGLIFLOZIN 25 MG PO TABS
25.0000 mg | ORAL_TABLET | Freq: Every morning | ORAL | Status: DC
  Administered 2020-09-14 – 2020-09-18 (×5): 25 mg via ORAL
  Filled 2020-09-13 (×7): qty 1

## 2020-09-13 MED ORDER — IPRATROPIUM-ALBUTEROL 0.5-2.5 (3) MG/3ML IN SOLN
3.0000 mL | Freq: Two times a day (BID) | RESPIRATORY_TRACT | Status: DC
  Administered 2020-09-13 – 2020-09-18 (×10): 3 mL via RESPIRATORY_TRACT
  Filled 2020-09-13 (×10): qty 3

## 2020-09-13 MED ORDER — LACTATED RINGERS IV SOLN: INTRAVENOUS | Status: DC

## 2020-09-13 MED ORDER — CHLORHEXIDINE GLUCONATE 0.12 % MT SOLN
15.0000 mL | Freq: Once | OROMUCOSAL | Status: AC
  Administered 2020-09-13: 15 mL via OROMUCOSAL

## 2020-09-13 MED ORDER — PHENYLEPHRINE 40 MCG/ML (10ML) SYRINGE FOR IV PUSH (FOR BLOOD PRESSURE SUPPORT)
PREFILLED_SYRINGE | INTRAVENOUS | Status: AC
  Filled 2020-09-13: qty 10

## 2020-09-13 MED ORDER — ONDANSETRON HCL 4 MG/2ML IJ SOLN
INTRAMUSCULAR | Status: DC | PRN
  Administered 2020-09-13: 4 mg via INTRAVENOUS

## 2020-09-13 MED ORDER — ONDANSETRON HCL 4 MG/2ML IJ SOLN: 4.0000 mg | Freq: Once | INTRAMUSCULAR | Status: DC | PRN

## 2020-09-13 MED ORDER — TRAMADOL HCL 50 MG PO TABS
50.0000 mg | ORAL_TABLET | Freq: Four times a day (QID) | ORAL | Status: DC
Start: 2020-09-13 — End: 2020-09-18
  Administered 2020-09-13 – 2020-09-18 (×20): 50 mg via ORAL
  Filled 2020-09-13 (×20): qty 1

## 2020-09-13 MED ORDER — MORPHINE SULFATE (PF) 2 MG/ML IV SOLN
0.5000 mg | INTRAVENOUS | Status: DC | PRN
  Administered 2020-09-13: 0.5 mg via INTRAVENOUS
  Administered 2020-09-14: 1 mg via INTRAVENOUS
  Filled 2020-09-13 (×2): qty 1

## 2020-09-13 MED ORDER — METOCLOPRAMIDE HCL 5 MG/ML IJ SOLN: 5.0000 mg | Freq: Three times a day (TID) | INTRAMUSCULAR | Status: DC | PRN

## 2020-09-13 MED ORDER — ENOXAPARIN SODIUM 30 MG/0.3ML IJ SOSY: 30.0000 mg | PREFILLED_SYRINGE | INTRAMUSCULAR | Status: DC

## 2020-09-13 MED ORDER — FENTANYL CITRATE (PF) 100 MCG/2ML IJ SOLN
INTRAMUSCULAR | Status: AC
  Filled 2020-09-13: qty 2

## 2020-09-13 MED ORDER — METFORMIN HCL 500 MG PO TABS
1000.0000 mg | ORAL_TABLET | Freq: Two times a day (BID) | ORAL | Status: DC
  Administered 2020-09-15 – 2020-09-18 (×7): 1000 mg via ORAL
  Filled 2020-09-13 (×7): qty 2

## 2020-09-13 MED ORDER — METOCLOPRAMIDE HCL 10 MG PO TABS: 5.0000 mg | ORAL_TABLET | Freq: Three times a day (TID) | ORAL | Status: DC | PRN

## 2020-09-13 MED ORDER — ENOXAPARIN SODIUM 40 MG/0.4ML IJ SOSY
40.0000 mg | PREFILLED_SYRINGE | INTRAMUSCULAR | Status: DC
  Administered 2020-09-14 – 2020-09-18 (×5): 40 mg via SUBCUTANEOUS
  Filled 2020-09-13 (×5): qty 0.4

## 2020-09-13 MED ORDER — LIDOCAINE HCL (CARDIAC) PF 100 MG/5ML IV SOSY: PREFILLED_SYRINGE | INTRAVENOUS | Status: DC | PRN

## 2020-09-13 MED ORDER — MIDAZOLAM HCL 2 MG/2ML IJ SOLN
INTRAMUSCULAR | Status: DC | PRN
  Administered 2020-09-13: 1 mg via INTRAVENOUS

## 2020-09-13 MED ORDER — DOCUSATE SODIUM 100 MG PO CAPS
100.0000 mg | ORAL_CAPSULE | Freq: Two times a day (BID) | ORAL | Status: DC
  Administered 2020-09-13 – 2020-09-18 (×10): 100 mg via ORAL
  Filled 2020-09-13 (×10): qty 1

## 2020-09-13 MED ORDER — ONDANSETRON HCL 4 MG/2ML IJ SOLN
INTRAMUSCULAR | Status: AC
  Filled 2020-09-13: qty 2

## 2020-09-13 MED ORDER — IPRATROPIUM-ALBUTEROL 0.5-2.5 (3) MG/3ML IN SOLN: 3.0000 mL | Freq: Four times a day (QID) | RESPIRATORY_TRACT | Status: DC | PRN

## 2020-09-13 MED ORDER — PROPOFOL 10 MG/ML IV BOLUS
INTRAVENOUS | Status: AC
  Filled 2020-09-13: qty 20

## 2020-09-13 MED ORDER — ROPIVACAINE HCL 5 MG/ML IJ SOLN
INTRAMUSCULAR | Status: AC
  Filled 2020-09-13: qty 30

## 2020-09-13 MED ORDER — SENNOSIDES-DOCUSATE SODIUM 8.6-50 MG PO TABS: 1.0000 | ORAL_TABLET | Freq: Every evening | ORAL | Status: DC | PRN

## 2020-09-13 MED ORDER — LIDOCAINE HCL (PF) 2 % IJ SOLN
INTRAMUSCULAR | Status: AC
  Filled 2020-09-13: qty 5

## 2020-09-13 MED ORDER — FENTANYL CITRATE (PF) 250 MCG/5ML IJ SOLN
INTRAMUSCULAR | Status: DC | PRN
  Administered 2020-09-13 (×4): 25 ug via INTRAVENOUS

## 2020-09-13 MED ORDER — SODIUM CHLORIDE 0.9 % IV SOLN: INTRAVENOUS | Status: DC

## 2020-09-13 MED ORDER — LIDOCAINE HCL (PF) 1 % IJ SOLN
INTRAMUSCULAR | Status: AC
  Filled 2020-09-13: qty 30

## 2020-09-13 MED ORDER — ROPIVACAINE HCL 5 MG/ML IJ SOLN
INTRAMUSCULAR | Status: DC | PRN
  Administered 2020-09-13 (×2): 15 mL via EPIDURAL

## 2020-09-13 MED ORDER — PROPOFOL 10 MG/ML IV BOLUS
INTRAVENOUS | Status: DC | PRN
  Administered 2020-09-13: 150 mg via INTRAVENOUS

## 2020-09-13 MED ORDER — LIDOCAINE HCL (CARDIAC) PF 100 MG/5ML IV SOSY
PREFILLED_SYRINGE | INTRAVENOUS | Status: DC | PRN
  Administered 2020-09-13: 80 mg via INTRAVENOUS

## 2020-09-13 SURGICAL SUPPLY — 66 items
ANCH SUT 2 CRKSW 15.5X5 (Anchor) ×1 IMPLANT
ANCHOR SUT CORKSCREW 5X15.5 (Anchor) ×1 IMPLANT
APL PRP STRL LF DISP 70% ISPRP (MISCELLANEOUS) ×2
BANDAGE ELASTIC 4 VELCRO NS (GAUZE/BANDAGES/DRESSINGS) ×2 IMPLANT
BANDAGE ESMARK 4X12 BL STRL LF (DISPOSABLE) ×1 IMPLANT
BIT DRILL 3.5X122MM AO FIT (BIT) ×1 IMPLANT
BLADE SURG SZ10 CARB STEEL (BLADE) ×3 IMPLANT
BNDG CMPR 12X4 ELC STRL LF (DISPOSABLE) ×1
BNDG CMPR STD VLCR NS LF 5.8X4 (GAUZE/BANDAGES/DRESSINGS) ×2
BNDG COHESIVE 4X5 TAN STRL (GAUZE/BANDAGES/DRESSINGS) ×2 IMPLANT
BNDG ELASTIC 4X5.8 VLCR NS LF (GAUZE/BANDAGES/DRESSINGS) ×4 IMPLANT
BNDG ESMARK 4X12 BLUE STRL LF (DISPOSABLE) ×2
CHLORAPREP W/TINT 26 (MISCELLANEOUS) ×4 IMPLANT
CLOTH BEACON ORANGE TIMEOUT ST (SAFETY) ×2 IMPLANT
COVER LIGHT HANDLE STERIS (MISCELLANEOUS) ×4 IMPLANT
CUFF TOURN SGL QUICK 34 (TOURNIQUET CUFF) ×2
CUFF TRNQT CYL 34X4.125X (TOURNIQUET CUFF) ×1 IMPLANT
DECANTER SPIKE VIAL GLASS SM (MISCELLANEOUS) ×2 IMPLANT
DRAPE C-ARM FOLDED MOBILE STRL (DRAPES) ×2 IMPLANT
DRAPE HALF SHEET 40X57 (DRAPES) ×1 IMPLANT
DRILL 2.6X122MM WL AO SHAFT (BIT) ×1 IMPLANT
ELECT REM PT RETURN 9FT ADLT (ELECTROSURGICAL) ×2
ELECTRODE REM PT RTRN 9FT ADLT (ELECTROSURGICAL) ×1 IMPLANT
GAUZE SPONGE 4X4 12PLY STRL (GAUZE/BANDAGES/DRESSINGS) ×2 IMPLANT
GAUZE XEROFORM 5X9 LF (GAUZE/BANDAGES/DRESSINGS) ×2 IMPLANT
GLOVE SKINSENSE NS SZ8.0 LF (GLOVE) ×1
GLOVE SKINSENSE STRL SZ8.0 LF (GLOVE) ×1 IMPLANT
GLOVE SS N UNI LF 8.5 STRL (GLOVE) ×2 IMPLANT
GLOVE SURG UNDER POLY LF SZ7 (GLOVE) ×6 IMPLANT
GOWN STRL REUS W/TWL LRG LVL3 (GOWN DISPOSABLE) ×4 IMPLANT
GOWN STRL REUS W/TWL XL LVL3 (GOWN DISPOSABLE) ×2 IMPLANT
INST SET MINOR BONE (KITS) ×2 IMPLANT
KIT TURNOVER KIT A (KITS) ×2 IMPLANT
MANIFOLD NEPTUNE II (INSTRUMENTS) ×2 IMPLANT
NDL HYPO 21X1.5 SAFETY (NEEDLE) ×1 IMPLANT
NDL MAYO 6 CRC TAPER PT (NEEDLE) IMPLANT
NEEDLE HYPO 21X1.5 SAFETY (NEEDLE) ×2 IMPLANT
NEEDLE MAYO 6 CRC TAPER PT (NEEDLE) ×2 IMPLANT
NS IRRIG 1000ML POUR BTL (IV SOLUTION) ×3 IMPLANT
PACK BASIC LIMB (CUSTOM PROCEDURE TRAY) ×2 IMPLANT
PAD ABD 5X9 TENDERSORB (GAUZE/BANDAGES/DRESSINGS) ×4 IMPLANT
PAD ARMBOARD 7.5X6 YLW CONV (MISCELLANEOUS) ×3 IMPLANT
PAD CAST 4YDX4 CTTN HI CHSV (CAST SUPPLIES) ×1 IMPLANT
PADDING CAST COTTON 4X4 STRL (CAST SUPPLIES) ×4
PLATE STR 6HOLE (Plate) ×1 IMPLANT
SCREW BONE 18 (Screw) ×1 IMPLANT
SCREW BONE 3.5X16MM (Screw) ×3 IMPLANT
SCREW BONE 3.5X46 (Screw) ×1 IMPLANT
SCREW LOCK 20MMX3.5 (Screw) ×1 IMPLANT
SCREW LOCK 3.5X14 (Screw) ×1 IMPLANT
SCREW LOCKING 3.5X18MM (Screw) ×1 IMPLANT
SCREW NONLOCK 24MM (Screw) ×2 IMPLANT
SET BASIN LINEN APH (SET/KITS/TRAYS/PACK) ×2 IMPLANT
SPLINT IMMOBILIZER J 3INX20FT (CAST SUPPLIES) ×1
SPLINT J IMMOBILIZER 3X20FT (CAST SUPPLIES) IMPLANT
SPLINT J IMMOBILIZER 4X20FT (CAST SUPPLIES) IMPLANT
SPLINT J PLASTER J 4INX20Y (CAST SUPPLIES) ×1
SPONGE T-LAP 18X18 ~~LOC~~+RFID (SPONGE) ×3 IMPLANT
STAPLER VISISTAT 35W (STAPLE) ×2 IMPLANT
SUT ETHIBOND NAB OS 4 #2 30IN (SUTURE) ×1 IMPLANT
SUT ETHILON 3 0 FSL (SUTURE) ×1 IMPLANT
SUT MON AB 0 CT1 (SUTURE) ×3 IMPLANT
SUT MON AB 2-0 CT1 36 (SUTURE) ×1 IMPLANT
SYR 30ML LL (SYRINGE) ×2 IMPLANT
SYR BULB IRRIG 60ML STRL (SYRINGE) ×2 IMPLANT
WATER STERILE IRR 1000ML POUR (IV SOLUTION) ×1 IMPLANT

## 2020-09-13 NOTE — Consult Note (Addendum)
Reason for Consult:left ankle fracture Referring Physician: Dr Keane [place] is an 87 y.o. male.  HPI: 87 fell injured left ankle , fracture dislocation  H/o diabetes C/o pain   Past Medical History:  Diagnosis Date   Arthritis    GERD (gastroesophageal reflux disease)    Hyperlipidemia    Rib fractures 12/06/2014    Past Surgical History:  Procedure Laterality Date   ESOPHAGOGASTRODUODENOSCOPY (EGD) WITH PROPOFOL N/A 05/08/2018   Procedure: ESOPHAGOGASTRODUODENOSCOPY (EGD) WITH PROPOFOL;  Surgeon: Malissa Hippo, MD;  Location[facility];  Service: Endoscopy;  Laterality: N/A;   goiter removal     TOTAL KNEE ARTHROPLASTY Left 08/18/2012   Dr Lajoyce Corners   TOTAL KNEE ARTHROPLASTY Left 08/19/2012   Procedure: TOTAL KNEE ARTHROPLASTY;  Surgeon: Nadara Mustard, MD;  Location: [facility] OR;  Service: Orthopedics;  Laterality: Left;  Left Total Knee Arthroplasty    History reviewed. No pertinent family history.  Social History:  reports that he has never smoked. He has never used smokeless tobacco. He reports that he does not drink alcohol and does not use drugs.  Allergies: No Known Allergies  Medications: I have reviewed the patient's current medications.  Results for orders placed or performed during the hospital encounter of 09/11/20 (from the past 48 hour(s))  CBG monitoring, ED     Status: Abnormal   Collection Time: 09/11/20 10:34 PM  Result Value Ref Range   Glucose-Capillary 177 (H) 70 - 99 mg/dL    Comment: Glucose reference range applies only to samples taken after fasting for at least 8 hours.  Basic metabolic panel     Status: Abnormal   Collection Time: 09/12/20 12:02 AM  Result Value Ref Range   Sodium 139 135 - 145 mmol/L   Potassium 4.2 3.5 - 5.1 mmol/L   Chloride 104 98 - 111 mmol/L   CO2 27 22 - 32 mmol/L   Glucose, Bld 152 (H) 70 - 99 mg/dL    Comment: Glucose reference range applies only to samples taken after fasting for at least 8 hours.   BUN 17 8 - 23  mg/dL   Creatinine, Ser 1.61 (H) 0.61 - 1.24 mg/dL   Calcium 8.7 (L) 8.9 - 10.3 mg/dL   GFR, Estimated 47 (L) >60 mL/min    Comment: (NOTE) Calculated using the CKD-EPI Creatinine Equation (2021)    Anion gap 8 5 - 15    Comment: Performed at [facility], [address] [place], [place] [address]  CBC WITH DIFFERENTIAL     Status: Abnormal   Collection Time: 09/12/20 12:02 AM  Result Value Ref Range   WBC 12.2 (H) 4.0 - 10.5 K/uL   RBC 4.71 4.22 - 5.81 MIL/uL   Hemoglobin 12.5 (L) 13.0 - 17.0 g/dL   HCT 54.0 98.1 - 19.1 %   MCV 88.1 80.0 - 100.0 fL   MCH 26.5 26.0 - 34.0 pg   MCHC 30.1 30.0 - 36.0 g/dL   RDW 47.8 (H) 29.5 - 62.1 %   Platelets 226 150 - 400 K/uL   nRBC 0.0 0.0 - 0.2 %   Neutrophils Relative % 89 %   Neutro Abs 10.8 (H) 1.7 - 7.7 K/uL   Lymphocytes Relative 5 %   Lymphs Abs 0.7 0.7 - 4.0 K/uL   Monocytes Relative 5 %   Monocytes Absolute 0.6 0.1 - 1.0 K/uL   Eosinophils Relative 0 %   Eosinophils Absolute 0.0 0.0 - 0.5 K/uL   Basophils Relative  0 %   Basophils Absolute 0.0 0.0 - 0.1 K/uL   Immature Granulocytes 1 %   Abs Immature Granulocytes 0.09 (H) 0.00 - 0.07 K/uL    Comment: Performed at [facility], [address], [place], [place] [address]  Protime-INR     Status: None   Collection Time: 09/12/20 12:02 AM  Result Value Ref Range   Prothrombin Time 13.8 11.4 - 15.2 seconds   INR 1.1 0.8 - 1.2    Comment: (NOTE) INR goal varies based on device and disease states. Performed at [facility], [address], [place], [place] [address]   Type and screen [facility]     Status: None   Collection Time: 09/12/20 12:02 AM  Result Value Ref Range   ABO/RH(D) O POS    Antibody Screen NEG    Sample Expiration      09/15/2020,2359 Performed at [facility], [address], [place], [place] [address]   D-dimer, quantitative     Status: Abnormal   Collection Time: 09/12/20 12:02 AM  Result Value Ref Range   D-Dimer, Quant 2.09 (H) 0.00 - 0.50  ug/mL-FEU    Comment: (NOTE) At the manufacturer cut-off value of 0.5 g/mL FEU, this assay has a negative predictive value of 95-100%.This assay is intended for use in conjunction with a clinical pretest probability (PTP) assessment model to exclude pulmonary embolism (PE) and deep venous thrombosis (DVT) in outpatients suspected of PE or DVT. Results should be correlated with clinical presentation. Performed at [facility], [address] [place], [place] [address]   Resp Panel by RT-PCR (Flu A&B, Covid) Nasopharyngeal Swab     Status: None   Collection Time: 09/12/20 12:18 AM   Specimen: Nasopharyngeal Swab; Nasopharyngeal(NP) swabs in vial transport medium  Result Value Ref Range   SARS Coronavirus 2 by RT PCR NEGATIVE NEGATIVE    Comment: (NOTE) SARS-CoV-2 target nucleic acids are NOT DETECTED.  The SARS-CoV-2 RNA is generally detectable in upper respiratory specimens during the acute phase of infection. The lowest concentration of SARS-CoV-2 viral copies this assay can detect is 138 copies/mL. A negative result does not preclude SARS-Cov-2 infection and should not be used as the sole basis for treatment or other patient management decisions. A negative result may occur with  improper specimen collection/handling, submission of specimen other than nasopharyngeal swab, presence of viral mutation(s) within the areas targeted by this assay, and inadequate number of viral copies(<138 copies/mL). A negative result must be combined with clinical observations, patient history, and epidemiological information. The expected result is Negative.  Fact Sheet for Patients:  BloggerCourse.com  Fact Sheet for Healthcare Providers:  SeriousBroker.it  This test is no t yet approved or cleared by the [place] FDA and  has been authorized for detection and/or diagnosis of SARS-CoV-2 by FDA under an Emergency Use Authorization (EUA). This  EUA will remain  in effect (meaning this test can be used) for the duration of the COVID-19 declaration under Section 564(b)(1) of the Act, 21 U.S.C.section 360bbb-3(b)(1), unless the authorization is terminated  or revoked sooner.       Influenza A by PCR NEGATIVE NEGATIVE   Influenza B by PCR NEGATIVE NEGATIVE    Comment: (NOTE) The Xpert Xpress SARS-CoV-2/FLU/RSV plus assay is intended as an aid in the diagnosis of influenza from Nasopharyngeal swab specimens and should not be used as a sole basis for treatment. Nasal washings and aspirates are unacceptable for Xpert Xpress SARS-CoV-2/FLU/RSV testing.  Fact Sheet for Patients:  BloggerCourse.com  Fact Sheet for Healthcare Providers: SeriousBroker.it  This test is not yet approved or cleared by the [place] FDA and has been authorized for detection and/or diagnosis of SARS-CoV-2 by FDA under an Emergency Use Authorization (EUA). This EUA will remain in effect (meaning this test can be used) for the duration of the COVID-19 declaration under Section 564(b)(1) of the Act, 21 U.S.C. section 360bbb-3(b)(1), unless the authorization is terminated or revoked.  Performed at [facility], [address], [place], [place] [address]   Basic metabolic panel     Status: Abnormal   Collection Time: 09/12/20  5:10 AM  Result Value Ref Range   Sodium 141 135 - 145 mmol/L   Potassium 4.1 3.5 - 5.1 mmol/L   Chloride 104 98 - 111 mmol/L   CO2 26 22 - 32 mmol/L   Glucose, Bld 138 (H) 70 - 99 mg/dL    Comment: Glucose reference range applies only to samples taken after fasting for at least 8 hours.   BUN 16 8 - 23 mg/dL   Creatinine, Ser 4.76 0.61 - 1.24 mg/dL   Calcium 8.8 (L) 8.9 - 10.3 mg/dL   GFR, Estimated 58 (L) >60 mL/min    Comment: (NOTE) Calculated using the CKD-EPI Creatinine Equation (2021)    Anion gap 11 5 - 15    Comment: Performed at [facility], [address],  [place], [place] [address]  CBC     Status: Abnormal   Collection Time: 09/12/20  5:10 AM  Result Value Ref Range   WBC 10.6 (H) 4.0 - 10.5 K/uL   RBC 4.46 4.22 - 5.81 MIL/uL   Hemoglobin 11.8 (L) 13.0 - 17.0 g/dL   HCT 35.4 65.6 - 81.2 %   MCV 88.3 80.0 - 100.0 fL   MCH 26.5 26.0 - 34.0 pg   MCHC 29.9 (L) 30.0 - 36.0 g/dL   RDW 75.1 (H) 70.0 - 17.4 %   Platelets 213 150 - 400 K/uL   nRBC 0.0 0.0 - 0.2 %    Comment: Performed at [facility], [address], [place], [place] [address]  Surgical pcr screen     Status: None   Collection Time: 09/12/20  4:51 PM   Specimen: Nasal Mucosa; Nasal Swab  Result Value Ref Range   MRSA, PCR NEGATIVE NEGATIVE   Staphylococcus aureus NEGATIVE NEGATIVE    Comment: (NOTE) The Xpert SA Assay (FDA approved for NASAL specimens in patients 28 years of age and older), is one component of a comprehensive surveillance program. It is not intended to diagnose infection nor to guide or monitor treatment. Performed at [facility], [address], [place], [place] [address]   CBC     Status: Abnormal   Collection Time: 09/13/20  5:01 AM  Result Value Ref Range   WBC 7.9 4.0 - 10.5 K/uL   RBC 4.22 4.22 - 5.81 MIL/uL   Hemoglobin 11.2 (L) 13.0 - 17.0 g/dL   HCT 38.4 (L) 66.5 - 99.3 %   MCV 91.0 80.0 - 100.0 fL   MCH 26.5 26.0 - 34.0 pg   MCHC 29.2 (L) 30.0 - 36.0 g/dL   RDW 57.0 (H) 17.7 - 93.9 %   Platelets 212 150 - 400 K/uL   nRBC 0.0 0.0 - 0.2 %    Comment: Performed at [facility], [address] [place], [place] [address]  Renal function panel     Status: Abnormal   Collection Time: 09/13/20  5:01 AM  Result Value Ref Range   Sodium 135 135 - 145 mmol/L   Potassium 4.2 3.5 - 5.1 mmol/L   Chloride 103 98 - 111 mmol/L   CO2 26 22 - 32 mmol/L   Glucose, Bld 116 (H) 70 - 99 mg/dL    Comment: Glucose reference range applies only to samples taken after fasting for at least 8 hours.   BUN 15 8 - 23 mg/dL   Creatinine, Ser 4.091.29 (H) 0.61 - 1.24 mg/dL    Calcium 7.5 (L) 8.9 - 10.3 mg/dL   Phosphorus 2.8 2.5 - 4.6 mg/dL   Albumin 3.4 (L) 3.5 - 5.0 g/dL   GFR, Estimated 54 (L) >60 mL/min    Comment: (NOTE) Calculated using the CKD-EPI Creatinine Equation (2021)    Anion gap 6 5 - 15    Comment: Performed at [facility], [address] [place], [place] [id number]    DG Ankle Complete Left  Result Date: 09/11/2020 CLINICAL DATA:  Trip and fall with left ankle pain, initial encounter EXAM: LEFT ANKLE COMPLETE - 3+ VIEW COMPARISON:  None. FINDINGS: Oblique fracture through the distal fibular metaphysis is noted. Lateral displacement of the talus with respect to the tibia is noted measuring at least 2.3 cm. The talus is mildly posteriorly displaced as well. No definitive distal tibial fracture is seen. Mild tarsal degenerative changes are noted. IMPRESSION: Distal fibular fracture with lateral subluxation/dislocation of the talus with respect to the distal tibia. Electronically Signed   By: Alcide CleverMark  Lukens M.D.   On: 09/11/2020 23:38   CT Angio Chest Pulmonary Embolism (PE) W or WO Contrast  Result Date: 09/12/2020 CLINICAL DATA:  Elevated D-dimer. EXAM: CT ANGIOGRAPHY CHEST WITH CONTRAST TECHNIQUE: Multidetector CT imaging of the chest was performed using the standard protocol during bolus administration of intravenous contrast. Multiplanar CT image reconstructions and MIPs were obtained to evaluate the vascular anatomy. CONTRAST:  100 mL OMNIPAQUE IOHEXOL 350 MG/ML SOLN COMPARISON:  Single-view of the chest 09/12/2020. CT chest, abdomen and pelvis 12/06/2014. FINDINGS: Cardiovascular: Satisfactory opacification of the pulmonary arteries to the segmental level. No evidence of pulmonary embolism. Normal heart size. No pericardial effusion. Calcific aortic and coronary atherosclerosis noted. Mediastinum/Nodes: No enlarged mediastinal, hilar, or axillary lymph nodes. Thyroid gland, trachea, and esophagus demonstrate no significant findings. Lungs/Pleura:  Extensive calcified pleural plaquing is again seen. Mild dependent atelectasis. No consolidative process, nodule or mass. No pleural effusion. Upper Abdomen: The visualized liver demonstrates fatty infiltration. Musculoskeletal: No acute bony abnormality. Remote healed left rib fractures noted. Review of the MIP images confirms the above findings. IMPRESSION: Negative for pulmonary embolus or acute disease. 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 Calcific coronary artery disease. Fatty infiltration of the liver. Aortic Atherosclerosis (ICD10-I70.0). Electronically Signed   By: Drusilla Kannerhomas  Dalessio M.D.   On: 09/12/2020 19:38   DG Chest Port 1 View  Result Date: 09/12/2020 CLINICAL DATA:  Shortness of breath history of left ankle fracture EXAM: PORTABLE CHEST 1 VIEW COMPARISON:  12/06/2014 FINDINGS: Cardiac shadow is mildly enlarged but accentuated by the portable technique. Aortic calcifications are noted. Bibasilar atelectatic changes are seen. No confluent infiltrate or effusion is noted. No bony abnormality is seen. IMPRESSION: Bibasilar atelectatic changes as described. Electronically Signed   By: Alcide CleverMark  Lukens M.D.   On: 09/12/2020 00:21   DG Ankle Left Port  Result Date: 09/12/2020 CLINICAL DATA:  Status post reduction EXAM: PORTABLE LEFT ANKLE - 2 VIEW COMPARISON:  Films from the previous day. FINDINGS: There  has been some reduction with improved positioning of the talus although there remains significant lateral displacement. Distal fibular fracture is again seen. A few small bony densities are noted consistent with avulsions in the tibiotalar space, not well visualized on prior exam. IMPRESSION: Interval reduction although persistent lateral displacement of the talus with respect to the distal tibia is seen. Electronically Signed   By: Alcide Clever M.D.   On: 09/12/2020 01:44    Review of Systems  No cp or sob   Decreased ambulation using a gator     Blood  pressure 95/61, pulse 69, temperature 98.6 F (37 C), temperature source Oral, resp. rate 17, height 6\' 3"  (1.905 m), weight (!) 141.7 kg, SpO2 96 %. Physical Exam Constitutional:      General: He is not in acute distress.    Appearance: He is well-developed.  Cardiovascular:     Comments: No peripheral edema Skin:    General: Skin is warm and dry.  Neurological:     Mental Status: He is alert and oriented to person, place, and time.     Sensory: No sensory deficit.     Coordination: Coordination normal.     Gait: Gait abnormal.     Deep Tendon Reflexes: Reflexes are normal and symmetric. Reflexes normal.  Psychiatric:        Mood and Affect: Mood normal.        Behavior: Behavior normal.        Thought Content: Thought content normal.        Judgment: Judgment normal.   Left ankle medial abrasion   Deformed foot ankle   Neuro vascular intact   Assessment/Plan: ORIF  left ankle   The procedure has been fully reviewed with the patient; The risks and benefits of surgery have been discussed and explained and understood. Alternative treatment has also been reviewed, questions were encouraged and answered. The postoperative plan is also been reviewed.   09/13/2020, 12:45 PM

## 2020-09-13 NOTE — Progress Notes (Signed)
PROGRESS NOTE  Angel Norman [facility]  VZD:[id number] DOB: [birth year] DOA: 09/11/2020 PCP: Lupita Raider, MD   Brief Narrative: Angel Norman is a 87 y.o. male with a history of T2DM, GERD, dyslipidemia, and osteoarthritis who presented to the ED 7/11 with left ankle pain after twisting it from stepping on a rock. SpO2 was noted to be low on arrival with CXR only showing atelectasis, covid and flu negative. XR confirmed distal fibular fracture with lateral subluxation/dislocation of the talus with respect to the distal tibia. He was admitted, given analgesics, and taken to the OR 7/13 for ORIF by Dr. Romeo Apple.   Assessment & Plan: Active Problems:   Closed fracture of left ankle  Left ankle fracture:  - s/p ORIF 7/13 by Dr. Romeo Apple. Post operative weight bearing recommendations, and pain control per orthopedics.  - At high risk of VTE, will order lovenox 40mg  unless ortho wishes for alternative.   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  - Encouraged regular incentive spirometry with known atelectasis and immobility.  - Wean oxygen as tolerated.   AKI: Improved with fluids.  - Hold NSAID and monitor  T2DM:  - Hold jardiance, metformin, continue SSI.   CAD: Noted by CT.  - Outpatient follow up and risk optimization.   HLD:  - Continue statin  GERD:  - Continue PPI  BPH:  - Continue flomax, monitor UOP  Obesity: Estimated body mass index is 39.05 kg/m as calculated from the following:   Height as of this encounter: 6\' 3"  (1.905 m).   Weight as of this encounter: 141.7 kg.  DVT prophylaxis: Lovenox Code Status: Full Family Communication: None at bedside Disposition Plan:  Status is: Inpatient  Remains inpatient appropriate because:Ongoing active pain requiring inpatient pain management and Inpatient level of care  appropriate due to severity of illness  Dispo: The patient is from: Home              Anticipated d/c is to:  TBD              Patient currently is not medically stable to d/c.   Difficult to place patient No  Consultants:  Orthopedics  Procedures:  09/13/2020 Dr. : OPEN REDUCTION INTERNAL FIXATION (ORIF) LEFT ANKLE FRACTURE (Left)  Antimicrobials: None   Subjective: Pain in ankle is severe when moving, limits any movement, constant, improved with pain medications. No chest pain or dyspnea or palpitations.   Objective: Vitals:   09/13/20 1545 09/13/20 1600 09/13/20 1615 09/13/20 1621  BP: 124/61  (!) 118/59 134/69  Pulse: 84 83 (!) 111 85  Resp: 13 14 17 14   Temp:    97.8 F (36.6 C)  TempSrc:      SpO2: 95% 97% 93% 97%  Weight:      Height:        Intake/Output Summary (Last 24 hours) at 09/13/2020 1628 Last data filed at 09/13/2020 1500 Gross per 24 hour  Intake 1849.14 ml  Output 925 ml  Net 924.14 ml   Filed Weights   09/11/20 2225 09/12/20 0612  Weight: (!) 137 kg (!) 141.7 kg   Gen: Obese male in no distress  Pulm: Non-labored breathing supplemental oxygen. Clear to auscultation bilaterally.  CV: Regular rate and rhythm. No murmur, rub, or gallop. No JVD, no pitting pedal edema. GI: Abdomen soft, non-tender, non-distended, with normoactive bowel sounds. No organomegaly or  masses felt. Ext: Warm, no deformities. LE's distally neurovascularly intact. Severely limited ROM of left ankle due to pain.  Skin: No rashes, lesions or ulcers on visualized skin Neuro: Alert and oriented. No focal neurological deficits. Psych: Judgement and insight appear normal. Mood & affect appropriate.   Data Reviewed: I have personally reviewed following labs and imaging studies  CBC: Recent Labs  Lab 09/12/20 0002 09/12/20 0510 09/13/20 0501  WBC 12.2* 10.6* 7.9  NEUTROABS 10.8*  --   --   HGB 12.5* 11.8* 11.2*  HCT 41.5 39.4 38.4*  MCV 88.1 88.3 91.0  PLT 226 213  212   Basic Metabolic Panel: Recent Labs  Lab 09/12/20 0002 09/12/20 0510 09/13/20 0501  NA 139 141 135  K 4.2 4.1 4.2  CL 104 104 103  CO2 27 26 26   GLUCOSE 152* 138* 116*  BUN 17 16 15   CREATININE 1.46* 1.22 1.29*  CALCIUM 8.7* 8.8* 7.5*  PHOS  --   --  2.8   GFR: Estimated Creatinine Clearance: 63.6 mL/min (A) (by C-G formula based on SCr of 1.29 mg/dL (H)). Liver Function Tests: Recent Labs  Lab 09/13/20 0501  ALBUMIN 3.4*   No results for input(s): LIPASE, AMYLASE in the last 168 hours. No results for input(s): AMMONIA in the last 168 hours. Coagulation Profile: Recent Labs  Lab 09/12/20 0002  INR 1.1   Cardiac Enzymes: No results for input(s): CKTOTAL, CKMB, CKMBINDEX, TROPONINI in the last 168 hours. BNP (last 3 results) No results for input(s): PROBNP in the last 8760 hours. HbA1C: No results for input(s): HGBA1C in the last 72 hours. CBG: Recent Labs  Lab 09/11/20 2234 09/13/20 1246  GLUCAP 177* 87   Lipid Profile: No results for input(s): CHOL, HDL, LDLCALC, TRIG, CHOLHDL, LDLDIRECT in the last 72 hours. Thyroid Function Tests: No results for input(s): TSH, T4TOTAL, FREET4, T3FREE, THYROIDAB in the last 72 hours. Anemia Panel: No results for input(s): VITAMINB12, FOLATE, FERRITIN, TIBC, IRON, RETICCTPCT in the last 72 hours. Urine analysis:    Component Value Date/Time   COLORURINE YELLOW 05/09/2018 0937   APPEARANCEUR CLEAR 05/09/2018 0937   LABSPEC 1.020 05/09/2018 0937   PHURINE 6.0 05/09/2018 0937   GLUCOSEU NEGATIVE 05/09/2018 0937   HGBUR NEGATIVE 05/09/2018 0937   BILIRUBINUR NEGATIVE 05/09/2018 0937   KETONESUR NEGATIVE 05/09/2018 0937   PROTEINUR NEGATIVE 05/09/2018 0937   NITRITE NEGATIVE 05/09/2018 0937   LEUKOCYTESUR NEGATIVE 05/09/2018 07/09/2018   Recent Results (from the past 240 hour(s))  Resp Panel by RT-PCR (Flu A&B, Covid) Nasopharyngeal Swab     Status: None   Collection Time: 09/12/20 12:18 AM   Specimen: Nasopharyngeal  Swab; Nasopharyngeal(NP) swabs in vial transport medium  Result Value Ref Range Status   SARS Coronavirus 2 by RT PCR NEGATIVE NEGATIVE Final    Comment: (NOTE) SARS-CoV-2 target nucleic acids are NOT DETECTED.  The SARS-CoV-2 RNA is generally detectable in upper respiratory specimens during the acute phase of infection. The lowest concentration of SARS-CoV-2 viral copies this assay can detect is 138 copies/mL. A negative result does not preclude SARS-Cov-2 infection and should not be used as the sole basis for treatment or other patient management decisions. A negative result may occur with  improper specimen collection/handling, submission of specimen other than nasopharyngeal swab, presence of viral mutation(s) within the areas targeted by this assay, and inadequate number of viral copies(<138 copies/mL). A negative result must be combined with clinical observations, patient history, and epidemiological information. The expected result is Negative.  Fact Sheet for Patients:  BloggerCourse.comhttps://www.fda.gov/media/152166/download  Fact Sheet for Healthcare Providers:  SeriousBroker.ithttps://www.fda.gov/media/152162/download  This test is no t yet approved or cleared by the [place] FDA and  has been authorized for detection and/or diagnosis of SARS-CoV-2 by FDA under an Emergency Use Authorization (EUA). This EUA will remain  in effect (meaning this test can be used) for the duration of the COVID-19 declaration under Section 564(b)(1) of the Act, 21 U.S.C.section 360bbb-3(b)(1), unless the authorization is terminated  or revoked sooner.       Influenza A by PCR NEGATIVE NEGATIVE Final   Influenza B by PCR NEGATIVE NEGATIVE Final    Comment: (NOTE) The Xpert Xpress SARS-CoV-2/FLU/RSV plus assay is intended as an aid in the diagnosis of influenza from Nasopharyngeal swab specimens and should not be used as a sole basis for treatment. Nasal washings and aspirates are unacceptable for Xpert Xpress  SARS-CoV-2/FLU/RSV testing.  Fact Sheet for Patients: BloggerCourse.comhttps://www.fda.gov/media/152166/download  Fact Sheet for Healthcare Providers: SeriousBroker.ithttps://www.fda.gov/media/152162/download  This test is not yet approved or cleared by the [place] FDA and has been authorized for detection and/or diagnosis of SARS-CoV-2 by FDA under an Emergency Use Authorization (EUA). This EUA will remain in effect (meaning this test can be used) for the duration of the COVID-19 declaration under Section 564(b)(1) of the Act, 21 U.S.C. section 360bbb-3(b)(1), unless the authorization is terminated or revoked.  Performed at [facility], [address] [place], [place] [address]   Surgical pcr screen     Status: None   Collection Time: 09/12/20  4:51 PM   Specimen: Nasal Mucosa; Nasal Swab  Result Value Ref Range Status   MRSA, PCR NEGATIVE NEGATIVE Final   Staphylococcus aureus NEGATIVE NEGATIVE Final    Comment: (NOTE) The Xpert SA Assay (FDA approved for NASAL specimens in patients 85 years of age and older), is one component of a comprehensive surveillance program. It is not intended to diagnose infection nor to guide or monitor treatment. Performed at [facility], [address] [place], [place] [address]       Radiology Studies: DG Ankle Complete Left  Result Date: 09/11/2020 CLINICAL DATA:  Trip and fall with left ankle pain, initial encounter EXAM: LEFT ANKLE COMPLETE - 3+ VIEW COMPARISON:  None. FINDINGS: Oblique fracture through the distal fibular metaphysis is noted. Lateral displacement of the talus with respect to the tibia is noted measuring at least 2.3 cm. The talus is mildly posteriorly displaced as well. No definitive distal tibial fracture is seen. Mild tarsal degenerative changes are noted. IMPRESSION: Distal fibular fracture with lateral subluxation/dislocation of the talus with respect to the distal tibia. Electronically Signed   By: Alcide CleverMark  Lukens M.D.   On: 09/11/2020 23:38   CT  Angio Chest Pulmonary Embolism (PE) W or WO Contrast  Result Date: 09/12/2020 CLINICAL DATA:  Elevated D-dimer. EXAM: CT ANGIOGRAPHY CHEST WITH CONTRAST TECHNIQUE: Multidetector CT imaging of the chest was performed using the standard protocol during bolus administration of intravenous contrast. Multiplanar CT image reconstructions and MIPs were obtained to evaluate the vascular anatomy. CONTRAST:  100 mL OMNIPAQUE IOHEXOL 350 MG/ML SOLN COMPARISON:  Single-view of the chest 09/12/2020. CT chest, abdomen and pelvis 12/06/2014. FINDINGS: Cardiovascular: Satisfactory opacification of the pulmonary arteries to the segmental level. No evidence of pulmonary embolism. Normal heart size. No pericardial effusion. Calcific aortic and coronary atherosclerosis noted. Mediastinum/Nodes: No enlarged mediastinal, hilar, or axillary lymph nodes. Thyroid gland, trachea, and esophagus demonstrate no significant findings. Lungs/Pleura: Extensive calcified pleural plaquing is  again seen. Mild dependent atelectasis. No consolidative process, nodule or mass. No pleural effusion. Upper Abdomen: The visualized liver demonstrates fatty infiltration. Musculoskeletal: No acute bony abnormality. Remote healed left rib fractures noted. Review of the MIP images confirms the above findings. IMPRESSION: Negative for pulmonary embolus or acute disease. 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 Calcific coronary artery disease. Fatty infiltration of the liver. Aortic Atherosclerosis (ICD10-I70.0). Electronically Signed   By: Drusilla Kanner M.D.   On: 09/12/2020 19:38   DG Chest Port 1 View  Result Date: 09/12/2020 CLINICAL DATA:  Shortness of breath history of left ankle fracture EXAM: PORTABLE CHEST 1 VIEW COMPARISON:  12/06/2014 FINDINGS: Cardiac shadow is mildly enlarged but accentuated by the portable technique. Aortic calcifications are noted. Bibasilar atelectatic changes are seen. No confluent  infiltrate or effusion is noted. No bony abnormality is seen. IMPRESSION: Bibasilar atelectatic changes as described. Electronically Signed   By: Alcide Clever M.D.   On: 09/12/2020 00:21   DG Ankle Left Port  Result Date: 09/12/2020 CLINICAL DATA:  Status post reduction EXAM: PORTABLE LEFT ANKLE - 2 VIEW COMPARISON:  Films from the previous day. FINDINGS: There has been some reduction with improved positioning of the talus although there remains significant lateral displacement. Distal fibular fracture is again seen. A few small bony densities are noted consistent with avulsions in the tibiotalar space, not well visualized on prior exam. IMPRESSION: Interval reduction although persistent lateral displacement of the talus with respect to the distal tibia is seen. Electronically Signed   By: Alcide Clever M.D.   On: 09/12/2020 01:44    Scheduled Meds:  [MAR Hold] ipratropium-albuterol  3 mL Nebulization BID   [MAR Hold] pantoprazole  40 mg Oral BID   povidone-iodine  2 application Topical Once   [MAR Hold] pravastatin  80 mg Oral q1800   [MAR Hold] tamsulosin  0.4 mg Oral Daily   Continuous Infusions:  sodium chloride 100 mL/hr at 09/13/20 0832   lactated ringers 10 mL/hr at 09/13/20 1250     LOS: 1 day   Time spent: 25 minutes.  Tyrone Nine, MD [facility] [email or web address] 09/13/2020, 4:28 PM

## 2020-09-13 NOTE — Brief Op Note (Signed)
09/13/2020  3:09 PM  PATIENT:  Angel Norman  87 y.o. male  PRE-OPERATIVE DIAGNOSIS:  ankle fracture - left  POST-OPERATIVE DIAGNOSIS:  ankle fracture - left  PROCEDURE:  Procedure(s): OPEN REDUCTION INTERNAL FIXATION (ORIF) LEFT ANKLE FRACTURE (Left)  SURGEON:  Surgeon(s) and Role:    Vickki Hearing, MD - Primary  PHYSICIAN ASSISTANT:   ASSISTANTS: Laurena Bering  ANESTHESIA:   general and ankle block  EBL:  25 mL   BLOOD ADMINISTERED:none  DRAINS: none   LOCAL MEDICATIONS USED:  NONE  SPECIMEN:  No Specimen  DISPOSITION OF SPECIMEN:  N/A  COUNTS:  YES  TOURNIQUET:   Total Tourniquet Time Documented: Calf (Left) - 87 minutes Total: Calf (Left) - 87 minutes   DICTATION: .Reubin Milan Dictation  PLAN OF CARE: Admit to inpatient   PATIENT DISPOSITION:  PACU - hemodynamically stable.   Delay start of Pharmacological VTE agent (>24hrs) due to surgical blood loss or risk of bleeding: yes

## 2020-09-13 NOTE — Interval H&P Note (Signed)
History and Physical Interval Note:  09/13/2020 12:44 PM  Angel Norman  has presented today for surgery, with the diagnosis of ankle fracture - left.  The various methods of treatment have been discussed with the patient and family. After consideration of risks, benefits and other options for treatment, the patient has consented to  Procedure(s): OPEN REDUCTION INTERNAL FIXATION (ORIF) LEFT ANKLE FRACTURE (Left) as a surgical intervention.  The patient's history has been reviewed, patient examined, no change in status, stable for surgery.  I have reviewed the patient's chart and labs.  Questions were answered to the patient's satisfaction.     Fuller Canada

## 2020-09-13 NOTE — Transfer of Care (Signed)
Immediate Anesthesia Transfer of Care Note  Patient: Angel Norman [facility]  Procedure(s) Performed: OPEN REDUCTION INTERNAL FIXATION (ORIF) LEFT ANKLE FRACTURE (Left: Ankle)  Patient Location: PACU  Anesthesia Type:General  Level of Consciousness: drowsy  Airway & Oxygen Therapy: Patient Spontanous Breathing and Patient connected to face mask oxygen  Post-op Assessment: Report given to RN and Post -op Vital signs reviewed and stable  Post vital signs: Reviewed and stable  Last Vitals:  Vitals Value Taken Time  BP 125/71 09/13/20 1509  Temp    Pulse 89 09/13/20  1509  Resp 18 09/13/20  1509  SpO2 97 09/13/20  1509  Vitals shown include unvalidated device data.  Last Pain:  Vitals:   09/13/20 1138  TempSrc: Oral  PainSc: 0-No pain      Patients Stated Pain Goal: 6 (09/13/20 1138)  Complications: No notable events documented.

## 2020-09-13 NOTE — Anesthesia Postprocedure Evaluation (Signed)
Anesthesia Post Note  Patient: Angel Norman [facility]  Procedure(s) Performed: OPEN REDUCTION INTERNAL FIXATION (ORIF) LEFT ANKLE FRACTURE (Left: Ankle)  Patient location during evaluation: Phase II Anesthesia Type: Regional Level of consciousness: awake Pain management: pain level controlled Vital Signs Assessment: post-procedure vital signs reviewed and stable Respiratory status: spontaneous breathing and respiratory function stable Cardiovascular status: blood pressure returned to baseline and stable Postop Assessment: no headache and no apparent nausea or vomiting Anesthetic complications: no Comments: Late entry   No notable events documented.   Last Vitals:  Vitals:   09/13/20 1138 09/13/20 1509  BP: 95/61   Pulse: 69 86  Resp: 17 15  Temp: 37 C 36.8 C  SpO2: 96% 97%    Last Pain:  Vitals:   09/13/20 1509  TempSrc:   PainSc: Asleep                 Windell Norfolk

## 2020-09-13 NOTE — Anesthesia Preprocedure Evaluation (Signed)
Anesthesia Evaluation  Patient identified by MRN, date of birth, ID band Patient awake    Reviewed: Allergy & Precautions, H&P , NPO status , Patient's Chart, lab work & pertinent test results, reviewed documented beta blocker date and time   Airway Mallampati: II  TM Distance: >3 FB Neck ROM: full    Dental no notable dental hx.    Pulmonary neg pulmonary ROS,    Pulmonary exam normal breath sounds clear to auscultation       Cardiovascular Exercise Tolerance: Good negative cardio ROS   Rhythm:regular Rate:Normal     Neuro/Psych negative neurological ROS  negative psych ROS   GI/Hepatic Neg liver ROS, GERD  Medicated,  Endo/Other  negative endocrine ROS  Renal/GU ARFnegative Renal ROS  negative genitourinary   Musculoskeletal   Abdominal   Peds  Hematology negative hematology ROS (+)   Anesthesia Other Findings   Reproductive/Obstetrics negative OB ROS                             Anesthesia Physical Anesthesia Plan  ASA: 2  Anesthesia Plan: General and General LMA   Post-op Pain Management:  Regional for Post-op pain and GA combined w/ Regional for post-op pain   Induction:   PONV Risk Score and Plan: Ondansetron  Airway Management Planned:   Additional Equipment:   Intra-op Plan:   Post-operative Plan:   Informed Consent: I have reviewed the patients History and Physical, chart, labs and discussed the procedure including the risks, benefits and alternatives for the proposed anesthesia with the patient or authorized representative who has indicated his/her understanding and acceptance.     Dental Advisory Given  Plan Discussed with: CRNA and Surgeon  Anesthesia Plan Comments:         Anesthesia Quick Evaluation

## 2020-09-13 NOTE — Plan of Care (Signed)

## 2020-09-13 NOTE — Anesthesia Procedure Notes (Addendum)
Procedure Name: LMA Insertion Date/Time: 09/13/2020 12:58 PM Performed by: Lorin Glass, CRNA Pre-anesthesia Checklist: Patient identified, Emergency Drugs available, Suction available and Patient being monitored Patient Re-evaluated:Patient Re-evaluated prior to induction Oxygen Delivery Method: Circle system utilized Preoxygenation: Pre-oxygenation with 100% oxygen Induction Type: IV induction LMA: LMA inserted LMA Size: 5.0 Number of attempts: 1 Tube secured with: Tape Dental Injury: Teeth and Oropharynx as per pre-operative assessment

## 2020-09-13 NOTE — Anesthesia Procedure Notes (Addendum)
Anesthesia Regional Block: Adductor canal block   Pre-Anesthetic Checklist: , timeout performed,  Correct Patient, Correct Site, Correct Laterality,  Correct Procedure, Correct Position, site marked,  Risks and benefits discussed,  Surgical consent,  Pre-op evaluation,  At surgeon's request and post-op pain management  Laterality: Left  Prep: chloraprep       Needles:  Injection technique: Single-shot  Needle Type: Stimiplex     Needle Length: 13cm  Needle Gauge: 20     Additional Needles:   Procedures:,,,, ultrasound used (permanent image in chart),,    Narrative:  Start time: 09/13/2020 12:10 PM End time: 09/13/2020 12:25 PM Injection made incrementally with aspirations every 5 mL.  Performed by: With CRNAs  Anesthesiologist: Windell Norfolk, MD CRNA: Brynda Peon, CRNA

## 2020-09-13 NOTE — Anesthesia Procedure Notes (Signed)
Anesthesia Regional Block: Popliteal block   Pre-Anesthetic Checklist: , timeout performed,  Correct Patient, Correct Site, Correct Laterality,  Correct Procedure, Correct Position, site marked,  Risks and benefits discussed,  Surgical consent,  Pre-op evaluation,  At surgeon's request and post-op pain management  Laterality: Left  Prep: chloraprep       Needles:  Injection technique: Single-shot  Needle Type: Stimiplex     Needle Length: 13cm  Needle Gauge: 20     Additional Needles:   Narrative:  Start time: 09/13/2020 11:59 AM End time: 09/13/2020 12:07 PM Injection made incrementally with aspirations every 5 mL. Anesthesiologist: Windell Norfolk, MD CRNA: Brynda Peon, CRNA

## 2020-09-13 NOTE — Op Note (Addendum)
09/13/2020  3:09 PM  PATIENT:  Angel Norman  87 y.o. male  PRE-OPERATIVE DIAGNOSIS:  ankle fracture - left  POST-OPERATIVE DIAGNOSIS:  ankle fracture - left  PROCEDURE:  Procedure(s): OPEN REDUCTION INTERNAL FIXATION (ORIF) LEFT ANKLE FRACTURE (Left)  Implants ankle solutions cluster plate with distal locking screws and proximal cortical screws including 1 cortical interfrag screw and 1 syndesmosis screw laterally  Medially we placed 1 Arthrex 5.0 corkscrew suture anchor and a #2 Ethibond figure-of-eight stitch x2  Findings Weber B lateral malleolus fracture intact syndesmosis complete deltoid ligament avulsion from the medial malleolus and anterior capsular avulsion from the tibia (BIMALLEOLAR EQUIVALENT)  Intraoperative fluoroscopy confirmed that the syndesmosis was intact however the talus was still subluxating in the ankle mortise secondary to the deltoid ligament avulsion  Details of procedure the patient was identified in the preop area the surgical site was confirmed as the left ankle and it was marked as such.  After chart review the patient was taken to the operating for general anesthesia.  Previously when the patient arrived to the preop area Dr. Carolin Guernsey did an ankle block  The left ankle was prepped and draped sterilely two 5 pound sandbag's have been placed under the hip and a tourniquet had been applied to the thigh  Implants were checked and timeout was completed  The limb was exsanguinated with a 4 inch Esmarch and the tourniquet was elevated to 280 mmHg  A longitudinal incision was made over the fibula and carried down to bone.  The fracture was exposed irrigated and manually reduced.  Several clamps were placed on the fracture of the fibula and x-rays confirmed the length and rotation and reduction of the mortise.  However we immediately noticed that there was clunk noted medially and upon visualizing the syndesmosis we noticed that there was no tear  We proceeded then to  complete the interfrag screw with a gliding hole threaded hole depth gauge and insertion of a cortical screw followed by x-ray once this was deemed in good position we used a precontoured lateral fibular plate starting at the obtuse angle of the fracture and then the acute angle of the fracture in terms of screw placement we finished the screws and took x-rays to confirm screw placement and that the fracture reduction was still intact which it was.  Upon stressing the syndesmosis there was no opening however the talus was rotating in the mortise  I therefore made a curvilinear incision laterally.  I chose a posterior approach because of the abrasion over the medial malleolus.  I divided the subcutaneous tissue and took the incision down to the posterior tibial tendon sheath.  The deltoid ligament was a complete avulsion from the medial malleolus and then there was also anterior avulsion of the joint capsule  After irrigating the medial side a 2.6 drill bit was used to make a starter hole in the medial malleolus and then a 5.0 corkscrew anchor was placed.  The 2 sutures were placed in the medial deltoid ligament with the foot reduced and radiographs confirm the reduction.  I took to #2 Ethibond sutures in figure-of-eight fashion and made to reinforcing sutures as well.  All wounds were irrigated closed medially with 2-0 Monocryl and interrupted 3-0 nylon and then closed laterally with 0 Monocryl and staples  The foot and ankle were placed in a sugar-tong splint with the foot in neutral position after the sterile dressings were applied  The postop plan is that the patient will be  nonweightbearing probably for 12 weeks  At 6 weeks we can probably go into a cam walking boot but stay nonweightbearing for the entire 12 weeks.  He can start range of motion exercises at that time.  The sutures can come out at 14 days at which time an x-ray will be obtained  SURGEON:  Surgeon(s) and Role:    * Vickki Hearing, MD - Primary  PHYSICIAN ASSISTANT:   ASSISTANTS: Laurena Bering  ANESTHESIA:   general and ankle block  EBL:  25 mL   BLOOD ADMINISTERED:none  DRAINS: none   LOCAL MEDICATIONS USED:  NONE  SPECIMEN:  No Specimen  DISPOSITION OF SPECIMEN:  N/A  COUNTS:  YES  TOURNIQUET:   Total Tourniquet Time Documented: Calf (Left) - 87 minutes Total: Calf (Left) - 87 minutes   DICTATION: .Reubin Milan Dictation  PLAN OF CARE: Admit to inpatient   PATIENT DISPOSITION:  PACU - hemodynamically stable.   Delay start of Pharmacological VTE agent (>24hrs) due to surgical blood loss or risk of bleeding: yes

## 2020-09-13 NOTE — OR Nursing (Signed)
Anesthesia block time out: 1158  Procedure start: 1159  Procedure Finish: 1225

## 2020-09-13 NOTE — OR Nursing (Signed)
Blood bank ID confirmed [id number]

## 2020-09-14 ENCOUNTER — Encounter (HOSPITAL_COMMUNITY): Payer: Self-pay | Admitting: Orthopedic Surgery

## 2020-09-14 ENCOUNTER — Inpatient Hospital Stay (HOSPITAL_COMMUNITY): Payer: Medicare PPO

## 2020-09-14 DIAGNOSIS — I2510 Atherosclerotic heart disease of native coronary artery without angina pectoris: Secondary | ICD-10-CM

## 2020-09-14 LAB — CBC
HCT: 37.1 % — ABNORMAL LOW (ref 39.0–52.0)
Hemoglobin: 10.9 g/dL — ABNORMAL LOW (ref 13.0–17.0)
MCH: 26.8 pg (ref 26.0–34.0)
MCHC: 29.4 g/dL — ABNORMAL LOW (ref 30.0–36.0)
MCV: 91.2 fL (ref 80.0–100.0)
Platelets: 170 10*3/uL (ref 150–400)
RBC: 4.07 MIL/uL — ABNORMAL LOW (ref 4.22–5.81)
RDW: 18.6 % — ABNORMAL HIGH (ref 11.5–15.5)
WBC: 9.9 10*3/uL (ref 4.0–10.5)
nRBC: 0 % (ref 0.0–0.2)

## 2020-09-14 LAB — COMPREHENSIVE METABOLIC PANEL
ALT: 16 U/L (ref 0–44)
AST: 31 U/L (ref 15–41)
Albumin: 3.3 g/dL — ABNORMAL LOW (ref 3.5–5.0)
Alkaline Phosphatase: 47 U/L (ref 38–126)
Anion gap: 7 (ref 5–15)
BUN: 15 mg/dL (ref 8–23)
CO2: 25 mmol/L (ref 22–32)
Calcium: 7.7 mg/dL — ABNORMAL LOW (ref 8.9–10.3)
Chloride: 104 mmol/L (ref 98–111)
Creatinine, Ser: 1.17 mg/dL (ref 0.61–1.24)
GFR, Estimated: >60 mL/min (ref >60)
Glucose, Bld: 121 mg/dL — ABNORMAL HIGH (ref 70–99)
Potassium: 4.2 mmol/L (ref 3.5–5.1)
Sodium: 136 mmol/L (ref 135–145)
Total Bilirubin: 0.8 mg/dL (ref 0.3–1.2)
Total Protein: 6.6 g/dL (ref 6.5–8.1)

## 2020-09-14 LAB — ECHOCARDIOGRAM COMPLETE
AR max vel: 2.52 cm2
AV Area VTI: 2.81 cm2
AV Area mean vel: 2.54 cm2
AV Mean grad: 5 mmHg
AV Peak grad: 8.6 mmHg
Ao pk vel: 1.47 m/s
Area-P 1/2: 6.27 cm2
Height: 75 in
MV VTI: 4.02 cm2
S' Lateral: 3.12 cm
Weight: 4998.27 oz

## 2020-09-14 MED ORDER — SENNOSIDES-DOCUSATE SODIUM 8.6-50 MG PO TABS
1.0000 | ORAL_TABLET | Freq: Two times a day (BID) | ORAL | Status: DC
  Administered 2020-09-14 – 2020-09-18 (×9): 1 via ORAL
  Filled 2020-09-14 (×9): qty 1

## 2020-09-14 MED ORDER — BISACODYL 10 MG RE SUPP
10.0000 mg | Freq: Every day | RECTAL | Status: DC | PRN
Start: 2020-09-14 — End: 2020-09-18

## 2020-09-14 MED ORDER — FUROSEMIDE 10 MG/ML IJ SOLN
40.0000 mg | Freq: Once | INTRAMUSCULAR | Status: AC
  Administered 2020-09-14: 40 mg via INTRAVENOUS
  Filled 2020-09-14: qty 4

## 2020-09-14 MED ORDER — PERFLUTREN LIPID MICROSPHERE
1.0000 mL | INTRAVENOUS | Status: AC | PRN
  Administered 2020-09-14: 3 mL via INTRAVENOUS
  Filled 2020-09-14: qty 10

## 2020-09-14 MED ORDER — POLYETHYLENE GLYCOL 3350 17 G PO PACK
17.0000 g | PACK | Freq: Every day | ORAL | Status: DC
  Administered 2020-09-14 – 2020-09-18 (×5): 17 g via ORAL
  Filled 2020-09-14 (×5): qty 1

## 2020-09-14 NOTE — Plan of Care (Signed)

## 2020-09-14 NOTE — NC FL2 (Signed)
[place] MEDICAID FL2 LEVEL OF CARE SCREENING TOOL     IDENTIFICATION  Patient Name: Angel Norman Birthdate: 28-Apr-1932 Sex: male Admission Date (Current Location): 09/11/2020  [facility] and [place] Number:  [employer] and Address:  [facility],  [address] [place] [address]      Provider Number: [id number]  Attending Physician Name and Address:  Tyrone Nine, MD  Relative Name and Phone Number:  Nhat, Hearne (Spouse)   [phone]    Current Level of Care: Hospital Recommended Level of Care: Skilled Nursing Facility Prior Approval Number:    Date Approved/Denied:   PASRR Number: [id number]  Discharge Plan: SNF    Current Diagnoses: Patient Active Problem List   Diagnosis Date Noted   Closed fracture of left ankle 09/12/2020   UGIB (upper gastrointestinal bleed) 05/09/2018   Anemia due to acute blood loss 05/09/2018   Obesity, Class III, BMI 40-49.9 (morbid obesity) (HCC) 05/09/2018   Acute lower UTI 05/09/2018   GERD (gastroesophageal reflux disease) 05/08/2018   AKI (acute kidney injury) (HCC) 05/07/2018   Blunt chest trauma 12/07/2014   Ileus (HCC) 12/07/2014   Fracture of multiple ribs of left side 12/06/2014    Orientation RESPIRATION BLADDER Height & Weight     Self, Time, Situation, Place  O2 (2L)   Weight: (!) 312 lb 6.3 oz (141.7 kg) Height:  6\' 3"  (190.5 cm)  BEHAVIORAL SYMPTOMS/MOOD NEUROLOGICAL BOWEL NUTRITION STATUS      Continent Diet (regular)  AMBULATORY STATUS COMMUNICATION OF NEEDS Skin   Limited Assist Verbally Surgical wounds                       Personal Care Assistance Level of Assistance  Bathing, Feeding, Dressing Bathing Assistance: Limited assistance Feeding assistance: Independent Dressing Assistance: Limited assistance     Functional Limitations Info  Sight, Hearing, Speech Sight Info: Adequate Hearing Info: Adequate Speech Info: Adequate    SPECIAL CARE FACTORS FREQUENCY  PT (By  licensed PT), OT (By licensed OT)     PT Frequency: 5x/week OT Frequency: 3x/week            Contractures Contractures Info: Not present    Additional Factors Info  Code Status, Allergies Code Status Info: Full Allergies Info: NKA           Current Medications (09/14/2020):  This is the current hospital active medication list Current Facility-Administered Medications  Medication Dose Route Frequency Provider Last Rate Last Admin   acetaminophen (TYLENOL) tablet 325-650 mg  325-650 mg Oral Q6H PRN 09/16/2020, MD       bisacodyl (DULCOLAX) suppository 10 mg  10 mg Rectal Daily PRN Vickki Hearing, MD       docusate sodium (COLACE) capsule 100 mg  100 mg Oral BID Tyrone Nine, MD   100 mg at 09/14/20 0914   empagliflozin (JARDIANCE) tablet 25 mg  25 mg Oral q morning 09/16/20, MD   25 mg at 09/14/20 0919   enoxaparin (LOVENOX) injection 40 mg  40 mg Subcutaneous Q24H 09/16/20, MD   40 mg at 09/14/20 0914   HYDROcodone-acetaminophen (NORCO) 7.5-325 MG per tablet 1-2 tablet  1-2 tablet Oral Q4H PRN 07-02-1976, MD       HYDROcodone-acetaminophen (NORCO/VICODIN) 5-325 MG per tablet 1-2 tablet  1-2 tablet Oral Q4H PRN Vickki Hearing, MD   2 tablet at 09/13/20 2355   ipratropium-albuterol (DUONEB) 0.5-2.5 (3) MG/3ML  nebulizer solution 3 mL  3 mL Nebulization BID Vickki Hearing, MD   3 mL at 09/14/20 0758   ipratropium-albuterol (DUONEB) 0.5-2.5 (3) MG/3ML nebulizer solution 3 mL  3 mL Nebulization Q6H PRN Vickki Hearing, MD       magnesium hydroxide (MILK OF MAGNESIA) suspension 30 mL  30 mL Oral Daily PRN Vickki Hearing, MD       [START ON 09/15/2020] metFORMIN (GLUCOPHAGE) tablet 1,000 mg  1,000 mg Oral BID WC Vickki Hearing, MD       methocarbamol (ROBAXIN) tablet 500 mg  500 mg Oral Q6H PRN Vickki Hearing, MD       Or   methocarbamol (ROBAXIN) 500 mg in dextrose 5 % 50 mL IVPB  500 mg Intravenous Q6H PRN Vickki Hearing, MD       metoCLOPramide (REGLAN) tablet 5-10 mg  5-10 mg Oral Q8H PRN Vickki Hearing, MD       Or   metoCLOPramide (REGLAN) injection 5-10 mg  5-10 mg Intravenous Q8H PRN Vickki Hearing, MD       morphine 2 MG/ML injection 0.5-1 mg  0.5-1 mg Intravenous Q2H PRN Vickki Hearing, MD   1 mg at 09/14/20 0527   ondansetron (ZOFRAN) tablet 4 mg  4 mg Oral Q6H PRN Vickki Hearing, MD       Or   ondansetron [facility]) injection 4 mg  4 mg Intravenous Q6H PRN Vickki Hearing, MD       pantoprazole (PROTONIX) EC tablet 40 mg  40 mg Oral BID Vickki Hearing, MD   40 mg at 09/14/20 0914   polyethylene glycol (MIRALAX / GLYCOLAX) packet 17 g  17 g Oral Daily Tyrone Nine, MD       pravastatin (PRAVACHOL) tablet 80 mg  80 mg Oral q1800 Vickki Hearing, MD   80 mg at 09/13/20 1819   senna-docusate (Senokot-S) tablet 1 tablet  1 tablet Oral BID Tyrone Nine, MD       tamsulosin [facility]) capsule 0.4 mg  0.4 mg Oral Daily Vickki Hearing, MD   0.4 mg at 09/14/20 0914   traMADol (ULTRAM) tablet 50 mg  50 mg Oral Q6H Vickki Hearing, MD   50 mg at 09/14/20 1200   traZODone (DESYREL) tablet 25 mg  25 mg Oral QHS PRN Vickki Hearing, MD   25 mg at 09/13/20 2124     Discharge Medications: Please see discharge summary for a list of discharge medications.  Relevant Imaging Results:  Relevant Lab Results:   Additional Information SSN [id number] Juleen China, LCSW

## 2020-09-14 NOTE — Evaluation (Addendum)
Physical Therapy Evaluation Patient Details Name: Angel Norman MRN: [id number] DOB: [birth year] Today's Date: 09/14/2020   History of Present Illness  Angel Norman is a 87 y.o. 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  He denied any paresthesias or focal muscle weakness.  No head injuries or other injuries.  He had nausea and vomiting earlier.  He had headache in the ER without dizziness or blurred vision.  He denied presyncope or syncope.  No chest pain or palpitations.  No cough or wheezing or dyspnea.  No dysuria, oliguria or hematuria or flank pain.   Clinical Impression  Patient presents sitting up in bed and agreeable to therapy. Patient reported that he understood non-weight bearing status on his LLE. Patient was on 2 Lpm of O2 but was not wearing [place] correctly, O2 was removed during therapy based on resting SpO2 at 92%. Patient demonstrated good bed mobility with slow labored movements primarily due to increase pain. Patient was able to transfer to a seated position at EOB and maintain seated balance for 2 min without assist. Patient required mod/max assist and 2+ assist for therapist and patient safety. Patient was able to ambulate 5 feet at the bed side using a RW with mod assist for balance and safety. Patient transferred to the chair - nursing notified. After transfer patients SpO2 as reassessed at 88%, patient was placed back on 2 Lpm with SpO2 returning to 95%. Patient will benefit from continued physical therapy in hospital and recommended venue below to increase strength, balance, endurance for safe ADLs and gait.     Follow Up Recommendations SNF    Equipment Recommendations  None recommended by PT    Recommendations for Other Services        Precautions / Restrictions Precautions Precautions: Fall Restrictions Weight Bearing Restrictions: Yes LLE Weight Bearing: Non weight bearing      Mobility  Bed Mobility Overal bed mobility: Modified Independent             General bed mobility comments: increase time and labored movemetn due to reports of increased pain Patient Response: Cooperative  Transfers Overall transfer level: Needs assistance Equipment used: Rolling walker (2 wheeled) Transfers: Sit to/from [employer] Sit to Stand: +2 physical assistance;Max assist Stand pivot transfers: +2 physical assistance;Mod assist;Max assist       General transfer comment: +2 assist for patient and therapist safety  Ambulation/Gait Ambulation/Gait assistance: +2 physical assistance;Mod assist Gait Distance (Feet): 5 Feet Assistive device: Rolling walker (2 wheeled) Gait Pattern/deviations: Decreased step length - left;Decreased stride length;Step-to pattern Gait velocity: decreased   General Gait Details: NWB on LLE, 2+ assist for patient and therapist safety, able to [employer] NWB stats dueing transfer  Stairs            Wheelchair Mobility    Modified Rankin (Stroke Patients Only)       Balance Overall balance assessment: Modified Independent Sitting-balance support: Feet supported;No upper extremity supported Sitting balance-Leahy Scale: Good Sitting balance - Comments: at EOB   Standing balance support: Bilateral upper extremity supported;During functional activity Standing balance-Leahy Scale: Poor Standing balance comment: poor using RW due to NWB status  Pertinent Vitals/Pain Pain Assessment: Faces Faces Pain Scale: Hurts whole lot Pain Location: Left ankle Pain Descriptors / Indicators: Sore;Grimacing;Guarding;Operative site guarding Pain Intervention(s): Limited activity within patient's tolerance;Monitored during  session;Repositioned    Home Living Family/patient expects to be discharged to:: Private residence Living Arrangements: Spouse/significant other Available Help at Discharge: Available PRN/intermittently;Family Type of Home: House Home Access: Stairs to enter;Ramped entrance Entrance Stairs-Rails: Right;Left Entrance Stairs-Number of Steps: 3 Home Layout: Multi-level;Able to live on main level with bedroom/bathroom Home Equipment: Dan Humphreys - 2 wheels;Cane - single point;Bedside commode;Shower seat;Grab bars - tub/shower;Grab bars - toilet      Prior Function Level of Independence: Independent with assistive device(s)         Comments: reports that primarly is independent for ALDs with occasional use of cane for ambulation     Hand Dominance        Extremity/Trunk Assessment   Upper Extremity Assessment Upper Extremity Assessment: Defer to OT evaluation    Lower Extremity Assessment Lower Extremity Assessment: LLE deficits/detail LLE Deficits / Details: s/p ORIF, NWB status LLE: Unable to fully assess due to immobilization    Cervical / Trunk Assessment Cervical / Trunk Assessment: Normal  Communication      Cognition Arousal/Alertness: Awake/alert Behavior During Therapy: WFL for tasks assessed/performed Overall Cognitive Status: Within Functional Limits for tasks assessed                                        General Comments      Exercises     Assessment/Plan    PT Assessment Patient needs continued PT services  PT Problem List Decreased strength;Decreased activity tolerance;Decreased balance;Decreased mobility;Decreased coordination;Decreased range of motion       PT Treatment Interventions DME instruction;Gait training;Stair training;Functional mobility training;Therapeutic activities;Therapeutic exercise;Balance training;Patient/family education    PT Goals (Current goals can be found in the Care Plan section)  Acute Rehab PT  Goals Patient Stated Goal: return to farm PT Goal Formulation: With patient Time For Goal Achievement: 09/16/20 Potential to Achieve Goals: Good    Frequency Min 3X/week   Barriers to discharge        Co-evaluation PT/OT/SLP Co-Evaluation/Treatment: Yes Reason for Co-Treatment: To address functional/ADL transfers;For patient/therapist safety PT goals addressed during session: Mobility/safety with mobility;Proper use of DME         AM-PAC PT "6 Clicks" Mobility  Outcome Measure Help needed turning from your back to your side while in a flat bed without using bedrails?: None Help needed moving from lying on your back to sitting on the side of a flat bed without using bedrails?: A Little Help needed moving to and from a bed to a chair (including a wheelchair)?: A Lot Help needed standing up from a chair using your arms (e.g., wheelchair or bedside chair)?: A Lot Help needed to walk in hospital room?: A Lot Help needed climbing 3-5 steps with a railing? : Total 6 Click Score: 14    End of Session Equipment Utilized During Treatment: Oxygen Activity Tolerance: Patient tolerated treatment well;Patient limited by fatigue;Patient limited by pain Patient left: in chair;with call bell/phone within reach Nurse Communication: Mobility status PT Visit Diagnosis: Unsteadiness on feet (R26.81);Muscle weakness (generalized) (M62.81);Difficulty in walking, not elsewhere classified (R26.2)    Time: 1610-9604 PT Time Calculation (min) (ACUTE ONLY): 30 min   Charges:   PT Evaluation $PT Eval Moderate Complexity: 1 Mod PT Treatments $Therapeutic  Activity: 23-37 mins      9:32 AM, 09/14/20 Ocie Cornfield SPT  9:32 AM, 09/14/20 Ocie Bob, MPT Physical Therapist with [facility] 336 [phone] office [phone] mobile phone

## 2020-09-14 NOTE — Progress Notes (Signed)
Subjective: 1 Day Post-Op Procedure(s) (LRB): OPEN REDUCTION INTERNAL FIXATION (ORIF) LEFT ANKLE FRACTURE (Left) Patient reports pain as  the patient is sleeping I did not awaken .    Objective: Vital signs in last 24 hours:  BP 135/71 (BP Location: Left Arm)   Pulse 96   Temp 98 F (36.7 C)   Resp 19   Ht 6\' 3"  (1.905 m)   Wt (!) 141.7 kg   SpO2 95%   BMI 39.05 kg/m   Intake/Output from previous day: 07/13 0701 - 07/14 0700 In: 1711.4 [I.V.:1601.6; IV Piggyback:109.8] Out: 1550 [Urine:1525; Blood:25] Intake/Output this shift: No intake/output data recorded.  Recent Labs    09/12/20 0002 09/12/20 0510 09/13/20 0501 09/13/20 1706 09/14/20 0550  HGB 12.5* 11.8* 11.2* 11.5* 10.9*   Recent Labs    09/13/20 1706 09/14/20 0550  WBC 9.8 9.9  RBC 4.37 4.07*  HCT 39.5 37.1*  PLT 196 170   Recent Labs    09/13/20 0501 09/13/20 1706 09/14/20 0550  NA 135  --  136  K 4.2  --  4.2  CL 103  --  104  CO2 26  --  25  BUN 15  --  15  CREATININE 1.29* 1.14 1.17  GLUCOSE 116*  --  121*  CALCIUM 7.5*  --  7.7*   Recent Labs    09/12/20 0002  INR 1.1    Cast splint was intact color capillary refill was normal   Assessment/Plan: 1 Day Post-Op Procedure(s) (LRB): OPEN REDUCTION INTERNAL FIXATION (ORIF) LEFT ANKLE FRACTURE (Left) Up with therapy D/C IV fluids The postop plan is that the patient will be nonweightbearing probably for 12 weeks  At 6 weeks we can probably go into a cam walking boot but stay nonweightbearing for the entire 12 weeks.  He can start range of motion exercises at that time.  The sutures can come out at 14 days at which time an x-ray will be obtained  Post op visit in 2 weeks from 09/13/20    09/15/20 09/14/2020, 7:15 AM

## 2020-09-14 NOTE — TOC Initial Note (Signed)
Transition of Care [facility]) - Initial/Assessment Note    Patient Details  Name: Angel Norman MRN: [id number] Date of Birth: [birth year]  Transition of Care [facility] CM/SW Contact:    Annice Needy, LCSW Phone Number: 09/14/2020, 1:51 PM  Clinical Narrative:                 Patient from home with wife who has Alzheimers. Patient is her caregiver. Admitted for surgical repair of left broken ankle. PT recommends SNF. Daughter, Angel Norman, at beside and patient/daughter agreeable to SNF for rehab. Discussed options. Referrals made. Patient is independent at baseline and drives.   Expected Discharge Plan: Skilled Nursing Facility Barriers to Discharge: Continued Medical Work up   Patient Goals and CMS Choice Patient states their goals for this hospitalization and ongoing recovery are:: rehab and home      Expected Discharge Plan and Services Expected Discharge Plan: Skilled Nursing Facility     Post Acute Care Choice: Skilled Nursing Facility Living arrangements for the past 2 months: Single Family Home                                      Prior Living Arrangements/Services Living arrangements for the past 2 months: Single Family Home Lives with:: Spouse Patient language and need for interpreter reviewed:: Yes        Need for Family Participation in Patient Care: Yes (Comment) Care giver support system in place?: Yes (comment)   Criminal Activity/Legal Involvement Pertinent to Current Situation/Hospitalization: No - Comment as needed  Activities of Daily Living Home Assistive Devices/Equipment: None ADL Screening (condition at time of admission) Patient's cognitive ability adequate to safely complete daily activities?: Yes Is the patient deaf or have difficulty hearing?: Yes Does the patient have difficulty seeing, even when wearing glasses/contacts?: No Does the patient have difficulty concentrating, remembering, or making decisions?: No Patient able to express need  for assistance with ADLs?: Yes Does the patient have difficulty dressing or bathing?: No Independently performs ADLs?: Yes (appropriate for developmental age) Does the patient have difficulty walking or climbing stairs?: Yes Weakness of Legs: Left Weakness of Arms/Hands: None  Permission Sought/Granted Permission sought to share information with : Family Supports    Share Information with NAME: Angel Norman  Permission granted to share info w AGENCY: daughter        Emotional Assessment     Affect (typically observed): Appropriate Orientation: : Oriented to Self, Oriented to Place, Oriented to  Time, Oriented to Situation Alcohol / Substance Use: Not Applicable Psych Involvement: No (comment)  Admission diagnosis:  Pain [R52] AKI (acute kidney injury) (HCC) [N17.9] Closed fracture of left ankle, initial encounter [S82.892A] Closed fracture dislocation of left ankle [S82.892A] Patient Active Problem List   Diagnosis Date Noted   Closed fracture of left ankle 09/12/2020   UGIB (upper gastrointestinal bleed) 05/09/2018   Anemia due to acute blood loss 05/09/2018   Obesity, Class III, BMI 40-49.9 (morbid obesity) (HCC) 05/09/2018   Acute lower UTI 05/09/2018   GERD (gastroesophageal reflux disease) 05/08/2018   AKI (acute kidney injury) (HCC) 05/07/2018   Blunt chest trauma 12/07/2014   Ileus (HCC) 12/07/2014   Fracture of multiple ribs of left side 12/06/2014   PCP:  Lupita Raider, MD Pharmacy:   [facility] [phone] - [place], [place] - [address] AT [facility] [address] [place] [address] Phone: [phone] Fax: [phone]  Social Determinants of Health (SDOH) Interventions    Readmission Risk Interventions No flowsheet data found.   

## 2020-09-14 NOTE — Plan of Care (Addendum)
  Problem: Acute Rehab PT Goals(only PT should resolve) Goal: Patient Will Transfer Sit To/From Stand Outcome: Progressing Flowsheets (Taken 09/14/2020 0921) Patient will transfer sit to/from stand: with modified independence Note: Using RW follow NWB status  Goal: Pt Will Transfer Bed To Chair/Chair To Bed Outcome: Progressing Flowsheets (Taken 09/14/2020 0921) Pt will Transfer Bed to Chair/Chair to Bed: with modified independence Note: Using RW following NWB status  Goal: Pt Will Ambulate Outcome: Progressing Flowsheets (Taken 09/14/2020 0921) Pt will Ambulate:  15 feet  with modified independence  with rolling walker Note: Using RW following NWB status    9:23 AM, 09/14/20 Angel Norman SPT  9:31 AM, 09/14/20 Angel Bob, MPT Physical Therapist with [facility] 336 [phone] office [phone] mobile phone

## 2020-09-14 NOTE — Progress Notes (Signed)
  Echocardiogram 2D Echocardiogram has been performed.  Carolyne Fiscal 09/14/2020, 3:42 PM

## 2020-09-14 NOTE — Progress Notes (Signed)
PROGRESS NOTE  Angel Norman [facility]  NLZ:[id number] DOB: [birth year] DOA: 09/11/2020 PCP: Lupita Raider, MD   Brief Narrative: Angel Norman is a 87 y.o. male with a history of T2DM, GERD, dyslipidemia, and osteoarthritis who presented to the ED 7/11 with left ankle pain after twisting it from stepping on a rock. SpO2 was noted to be low on arrival with CXR only showing atelectasis, covid and flu negative. XR confirmed distal fibular fracture with lateral subluxation/dislocation of the talus with respect to the distal tibia. He was admitted, given analgesics, and taken to the OR 7/13 for ORIF by Dr. Romeo Apple.   Assessment & Plan: Active Problems:   Closed fracture of left ankle  Left ankle fracture:  - s/p ORIF 7/13 by Dr. Romeo Apple. Remain NWB LLE x12 weeks, at 6 weeks can probably use CAM boot. 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  - Encouraged regular incentive spirometry with known atelectasis and immobility.  - Check echocardiogram with calcific CAD on CT, no priors available, trial lasix with crackles on exam.  - Wean oxygen as tolerated.   AKI: Improved. Will stop fluids and give single dose of lasix as above.  - Hold NSAID and monitor  T2DM:  - Hold metformin, continue jardiance. Blood sugars at inpatient goal.   CAD: Noted by CT.  - Outpatient follow up and risk optimization.   HLD:  - Continue statin  GERD:  - Continue PPI  BPH:  - Continue flomax, monitor UOP  Obesity: Estimated body mass index is 39.05 kg/m as calculated from the following:   Height as of this encounter: 6\' 3"  (1.905 m).   Weight as of this encounter: 141.7 kg.  DVT prophylaxis: Lovenox Code Status: Full Family Communication: None at bedside Disposition Plan:  Status is:  Inpatient  Remains inpatient appropriate because:Ongoing active pain requiring inpatient pain management and Inpatient level of care appropriate due to severity of illness  Dispo: The patient is from: Home              Anticipated d/c is to:  TBD              Patient currently is not medically stable to d/c.   Difficult to place patient No  Consultants:  Orthopedics  Procedures:  09/13/2020 Dr. 09/15/2020: OPEN REDUCTION INTERNAL FIXATION (ORIF) LEFT ANKLE FRACTURE (Left)  Antimicrobials: None   Subjective: Pain is controlled, up to chair this morning but wants to get back in bed. No chest pain, reports remote tobacco use history. No wheezing currently. Breathing is "not too bad."   Objective: Vitals:   09/13/20 2023 09/13/20 2345 09/14/20 0504 09/14/20 0800  BP: 125/67 135/69 135/71   Pulse: (!) 101 98 96   Resp: 20 20 19    Temp: 99.1 F (37.3 C) 98.6 F (37 C) 98 F (36.7 C)   TempSrc: Oral Oral    SpO2: 97% 98% 95% 93%  Weight:      Height:        Intake/Output Summary (Last 24 hours) at 09/14/2020 1105 Last data filed at 09/14/2020 0500 Gross per 24 hour  Intake 1711.39 ml  Output 1350 ml  Net 361.39 ml   Filed Weights   09/11/20 2225 09/12/20 0612  Weight: (!) 137 kg (!) 141.7 kg   Gen: 87 y.o. male  in no distress Pulm: Nonlabored breathing supplemental oxygen. Crackles at bases, new from prior. CV: Regular rate and rhythm. No murmur, rub, or gallop. No definite JVD, + dependent edema. GI: Abdomen soft, non-tender, non-distended, with normoactive bowel sounds.  Ext: Warm, no deformities. Large dressing on LLE, some pitting noted bilaterally.  Skin: No new rashes, lesions or ulcers on visualized skin. Neuro: Alert and oriented. No focal neurological deficits. Psych: Judgement and insight appear fair. Mood euthymic & affect congruent. Behavior is appropriate.    Data Reviewed: I have personally reviewed following labs and imaging studies  CBC: Recent Labs   Lab 09/12/20 0002 09/12/20 0510 09/13/20 0501 09/13/20 1706 09/14/20 0550  WBC 12.2* 10.6* 7.9 9.8 9.9  NEUTROABS 10.8*  --   --   --   --   HGB 12.5* 11.8* 11.2* 11.5* 10.9*  HCT 41.5 39.4 38.4* 39.5 37.1*  MCV 88.1 88.3 91.0 90.4 91.2  PLT 226 213 212 196 170   Basic Metabolic Panel: Recent Labs  Lab 09/12/20 0002 09/12/20 0510 09/13/20 0501 09/13/20 1706 09/14/20 0550  NA 139 141 135  --  136  K 4.2 4.1 4.2  --  4.2  CL 104 104 103  --  104  CO2 27 26 26   --  25  GLUCOSE 152* 138* 116*  --  121*  BUN 17 16 15   --  15  CREATININE 1.46* 1.22 1.29* 1.14 1.17  CALCIUM 8.7* 8.8* 7.5*  --  7.7*  PHOS  --   --  2.8  --   --    GFR: Estimated Creatinine Clearance: 70.1 mL/min (by C-G formula based on SCr of 1.17 mg/dL). Liver Function Tests: Recent Labs  Lab 09/13/20 0501 09/14/20 0550  AST  --  31  ALT  --  16  ALKPHOS  --  47  BILITOT  --  0.8  PROT  --  6.6  ALBUMIN 3.4* 3.3*   No results for input(s): LIPASE, AMYLASE in the last 168 hours. No results for input(s): AMMONIA in the last 168 hours. Coagulation Profile: Recent Labs  Lab 09/12/20 0002  INR 1.1   Cardiac Enzymes: No results for input(s): CKTOTAL, CKMB, CKMBINDEX, TROPONINI in the last 168 hours. BNP (last 3 results) No results for input(s): PROBNP in the last 8760 hours. HbA1C: No results for input(s): HGBA1C in the last 72 hours. CBG: Recent Labs  Lab 09/11/20 2234 09/13/20 1246  GLUCAP 177* 87   Lipid Profile: No results for input(s): CHOL, HDL, LDLCALC, TRIG, CHOLHDL, LDLDIRECT in the last 72 hours. Thyroid Function Tests: No results for input(s): TSH, T4TOTAL, FREET4, T3FREE, THYROIDAB in the last 72 hours. Anemia Panel: No results for input(s): VITAMINB12, FOLATE, FERRITIN, TIBC, IRON, RETICCTPCT in the last 72 hours. Urine analysis:    Component Value Date/Time   COLORURINE YELLOW 05/09/2018 0937   APPEARANCEUR CLEAR 05/09/2018 0937   LABSPEC 1.020 05/09/2018 0937    PHURINE 6.0 05/09/2018 0937   GLUCOSEU NEGATIVE 05/09/2018 0937   HGBUR NEGATIVE 05/09/2018 0937   BILIRUBINUR NEGATIVE 05/09/2018 0937   KETONESUR NEGATIVE 05/09/2018 0937   PROTEINUR NEGATIVE 05/09/2018 0937   NITRITE NEGATIVE 05/09/2018 0937   LEUKOCYTESUR NEGATIVE 05/09/2018 07/09/2018   Recent Results (from the past 240 hour(s))  Resp Panel by RT-PCR (Flu A&B, Covid) Nasopharyngeal Swab     Status: None   Collection Time: 09/12/20 12:18 AM   Specimen: Nasopharyngeal Swab; Nasopharyngeal(NP) swabs in vial transport medium  Result Value Ref Range Status  SARS Coronavirus 2 by RT PCR NEGATIVE NEGATIVE Final    Comment: (NOTE) SARS-CoV-2 target nucleic acids are NOT DETECTED.  The SARS-CoV-2 RNA is generally detectable in upper respiratory specimens during the acute phase of infection. The lowest concentration of SARS-CoV-2 viral copies this assay can detect is 138 copies/mL. A negative result does not preclude SARS-Cov-2 infection and should not be used as the sole basis for treatment or other patient management decisions. A negative result may occur with  improper specimen collection/handling, submission of specimen other than nasopharyngeal swab, presence of viral mutation(s) within the areas targeted by this assay, and inadequate number of viral copies(<138 copies/mL). A negative result must be combined with clinical observations, patient history, and epidemiological information. The expected result is Negative.  Fact Sheet for Patients:  BloggerCourse.comhttps://www.fda.gov/media/152166/download  Fact Sheet for Healthcare Providers:  SeriousBroker.ithttps://www.fda.gov/media/152162/download  This test is no t yet approved or cleared by the [place] FDA and  has been authorized for detection and/or diagnosis of SARS-CoV-2 by FDA under an Emergency Use Authorization (EUA). This EUA will remain  in effect (meaning this test can be used) for the duration of the COVID-19 declaration under Section 564(b)(1)  of the Act, 21 U.S.C.section 360bbb-3(b)(1), unless the authorization is terminated  or revoked sooner.       Influenza A by PCR NEGATIVE NEGATIVE Final   Influenza B by PCR NEGATIVE NEGATIVE Final    Comment: (NOTE) The Xpert Xpress SARS-CoV-2/FLU/RSV plus assay is intended as an aid in the diagnosis of influenza from Nasopharyngeal swab specimens and should not be used as a sole basis for treatment. Nasal washings and aspirates are unacceptable for Xpert Xpress SARS-CoV-2/FLU/RSV testing.  Fact Sheet for Patients: BloggerCourse.comhttps://www.fda.gov/media/152166/download  Fact Sheet for Healthcare Providers: SeriousBroker.ithttps://www.fda.gov/media/152162/download  This test is not yet approved or cleared by the [place] FDA and has been authorized for detection and/or diagnosis of SARS-CoV-2 by FDA under an Emergency Use Authorization (EUA). This EUA will remain in effect (meaning this test can be used) for the duration of the COVID-19 declaration under Section 564(b)(1) of the Act, 21 U.S.C. section 360bbb-3(b)(1), unless the authorization is terminated or revoked.  Performed at [facility], [address] [place], [place] [address]   Surgical pcr screen     Status: None   Collection Time: 09/12/20  4:51 PM   Specimen: Nasal Mucosa; Nasal Swab  Result Value Ref Range Status   MRSA, PCR NEGATIVE NEGATIVE Final   Staphylococcus aureus NEGATIVE NEGATIVE Final    Comment: (NOTE) The Xpert SA Assay (FDA approved for NASAL specimens in patients 85 years of age and older), is one component of a comprehensive surveillance program. It is not intended to diagnose infection nor to guide or monitor treatment. Performed at [facility], [address] [place], [place] [address]       Radiology Studies: DG Ankle 2 Views Left  Result Date: 09/13/2020 CLINICAL DATA:  Left ankle ORIF. EXAM: DG C-ARM 1-60 MIN; LEFT ANKLE - 2 VIEW COMPARISON:  Radiographs 09/12/2020. FINDINGS: C-arm fluoroscopy was provided  in the operating room. 46 seconds of fluoroscopy time. 7 spot fluoroscopic images are submitted. These demonstrate open reduction and internal fixation of the distal fibular fracture using a lateral plate and screws. One of the screws protrudes through the distal syndesmosis. There is an anchor screw within the medial malleolus. There is near anatomic reduction of the fractures, and no residual widening of the ankle mortise or talar subluxation. No complications identified. IMPRESSION: Intraoperative views during  left ankle ORIF. No demonstrated complications. Electronically Signed   By: Carey Bullocks M.D.   On: 09/13/2020 16:51   CT Angio Chest Pulmonary Embolism (PE) W or WO Contrast  Result Date: 09/12/2020 CLINICAL DATA:  Elevated D-dimer. EXAM: CT ANGIOGRAPHY CHEST WITH CONTRAST TECHNIQUE: Multidetector CT imaging of the chest was performed using the standard protocol during bolus administration of intravenous contrast. Multiplanar CT image reconstructions and MIPs were obtained to evaluate the vascular anatomy. CONTRAST:  100 mL OMNIPAQUE IOHEXOL 350 MG/ML SOLN COMPARISON:  Single-view of the chest 09/12/2020. CT chest, abdomen and pelvis 12/06/2014. FINDINGS: Cardiovascular: Satisfactory opacification of the pulmonary arteries to the segmental level. No evidence of pulmonary embolism. Normal heart size. No pericardial effusion. Calcific aortic and coronary atherosclerosis noted. Mediastinum/Nodes: No enlarged mediastinal, hilar, or axillary lymph nodes. Thyroid gland, trachea, and esophagus demonstrate no significant findings. Lungs/Pleura: Extensive calcified pleural plaquing is again seen. Mild dependent atelectasis. No consolidative process, nodule or mass. No pleural effusion. Upper Abdomen: The visualized liver demonstrates fatty infiltration. Musculoskeletal: No acute bony abnormality. Remote healed left rib fractures noted. Review of the MIP images confirms the above findings. IMPRESSION:  Negative for pulmonary embolus or acute disease. 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 Calcific coronary artery disease. Fatty infiltration of the liver. Aortic Atherosclerosis (ICD10-I70.0). Electronically Signed   By: Drusilla Kanner M.D.   On: 09/12/2020 19:38   DG C-Arm 1-60 Min  Result Date: 09/13/2020 CLINICAL DATA:  Left ankle ORIF. EXAM: DG C-ARM 1-60 MIN; LEFT ANKLE - 2 VIEW COMPARISON:  Radiographs 09/12/2020. FINDINGS: C-arm fluoroscopy was provided in the operating room. 46 seconds of fluoroscopy time. 7 spot fluoroscopic images are submitted. These demonstrate open reduction and internal fixation of the distal fibular fracture using a lateral plate and screws. One of the screws protrudes through the distal syndesmosis. There is an anchor screw within the medial malleolus. There is near anatomic reduction of the fractures, and no residual widening of the ankle mortise or talar subluxation. No complications identified. IMPRESSION: Intraoperative views during left ankle ORIF. No demonstrated complications. Electronically Signed   By: Carey Bullocks M.D.   On: 09/13/2020 16:51    Scheduled Meds:  docusate sodium  100 mg Oral BID   empagliflozin  25 mg Oral q morning   enoxaparin (LOVENOX) injection  40 mg Subcutaneous Q24H   ipratropium-albuterol  3 mL Nebulization BID   [START ON 09/15/2020] metFORMIN  1,000 mg Oral BID WC   pantoprazole  40 mg Oral BID   pravastatin  80 mg Oral q1800   tamsulosin  0.4 mg Oral Daily   traMADol  50 mg Oral Q6H   Continuous Infusions:  sodium chloride 75 mL/hr at 09/13/20 2126   methocarbamol (ROBAXIN) IV       LOS: 2 days   Time spent: 25 minutes.  Tyrone Nine, MD [facility] [email or web address] 09/14/2020, 11:05 AM

## 2020-09-14 NOTE — Plan of Care (Signed)
  Problem: Acute Rehab OT Goals (only OT should resolve) Goal: Pt. Will Perform Grooming Flowsheets (Taken 09/14/2020 0946) Pt Will Perform Grooming:  with mod assist  standing  with adaptive equipment Goal: Pt. Will Perform Lower Body Dressing Flowsheets (Taken 09/14/2020 0946) Pt Will Perform Lower Body Dressing:  sitting/lateral leans  with supervision  with adaptive equipment Goal: Pt. Will Transfer To Toilet Flowsheets (Taken 09/14/2020 0946) Pt Will Transfer to Toilet:  with min assist  with mod assist  stand pivot transfer  bedside commode  Dorene Bruni OT, MOT

## 2020-09-14 NOTE — Evaluation (Signed)
Occupational Therapy Evaluation Patient Details Name: Angel Norman MRN: [id number] DOB: 04/11/1932 Today's Date: 09/14/2020    History of Present Illness Angel Norman is a 87 y.o. 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  He denied any paresthesias or focal muscle weakness.  No head injuries or other injuries.  He had nausea and vomiting earlier.  He had headache in the ER without dizziness or blurred vision.  He denied presyncope or syncope.  No chest pain or palpitations.  No cough or wheezing or dyspnea.  No dysuria, oliguria or hematuria or flank pain.   Clinical Impression   Pt agreeable to OT/PT co-evaluation. Pt able to complete bed mobility with labored movement but no additional assist. Pt requires max A x2 using RW due to difficulty with initial sit to stand. More mod to max A with RW to pivot to w/c. Verbal and tactile cuing needed for pt to maintain weight bearing status. Pt required assist to don R sock and reports he typically needs assist with socks, but he does not often wear socks. Pt demonstrates good UE strength. SpO2 levels fluctuated from 90% to 88% without O2. Pt placed back on 2L at the end of session. Pt will benefit from continued OT services in the hospital and recommended venue below to increase strength, balance, and endurance for safe ADL's.      Follow Up Recommendations  SNF    Equipment Recommendations  None recommended by OT           Precautions / Restrictions Precautions Precautions: Fall Restrictions Weight Bearing Restrictions: Yes LLE Weight Bearing: Non weight bearing      Mobility Bed Mobility Overal bed mobility: Modified Independent             General bed mobility comments: increase time and  labored movemetn due to reports of increased pain    Transfers Overall transfer level: Needs assistance Equipment used: Rolling walker (2 wheeled) Transfers: Sit to/from [employer] Sit to Stand: +2 physical assistance;Max assist Stand pivot transfers: +2 physical assistance;Mod assist;Max assist       General transfer comment: +2 assist for patient and therapist safety    Balance Overall balance assessment: Modified Independent Sitting-balance support: Feet supported;No upper extremity supported Sitting balance-Leahy Scale: Good Sitting balance - Comments: at EOB   Standing balance support: Bilateral upper extremity supported;During functional activity Standing balance-Leahy Scale: Poor Standing balance comment: poor using RW due to NWB status                           ADL either performed or assessed with clinical judgement   ADL Overall ADL's : Needs assistance/impaired                     Lower Body Dressing: Total assistance;Sitting/lateral leans Lower Body Dressing Details (indicate cue type and reason): donning R sock seated at EOB Toilet Transfer: Maximal assistance;+2 for physical assistance;RW Toilet Transfer Details (indicate cue type and reason): simulated via EOB to w/c transfer                 Vision Baseline Vision/History: No visual deficits (had implants in the past) Patient Visual Report: No change from baseline  Pertinent Vitals/Pain Pain Assessment: Faces Faces Pain Scale: Hurts whole lot Pain Location: Left ankle Pain Descriptors / Indicators: Sore;Grimacing;Guarding;Operative site guarding Pain Intervention(s): Limited activity within patient's tolerance;Monitored during session;Repositioned     Hand Dominance Left   Extremity/Trunk Assessment Upper Extremity Assessment Upper Extremity Assessment: Overall WFL for tasks assessed   Lower Extremity Assessment Lower Extremity  Assessment: Defer to PT evaluation LLE Deficits / Details: s/p ORIF, NWB status LLE: Unable to fully assess due to immobilization   Cervical / Trunk Assessment Cervical / Trunk Assessment: Normal   Communication Communication Communication: No difficulties   Cognition Arousal/Alertness: Awake/alert Behavior During Therapy: WFL for tasks assessed/performed Overall Cognitive Status: Within Functional Limits for tasks assessed                                                      Home Living Family/patient expects to be discharged to:: Private residence Living Arrangements: Spouse/significant other Available Help at Discharge: Available PRN/intermittently;Family Type of Home: House Home Access: Stairs to enter;Ramped entrance Entrance Stairs-Number of Steps: 3 Entrance Stairs-Rails: Right;Left Home Layout: Multi-level;Able to live on main level with bedroom/bathroom (Pt does report going to the basement level.) Alternate Level Stairs-Number of Steps: 10+ Alternate Level Stairs-Rails: Can reach both;Right;Left Bathroom Shower/Tub: Chief Strategy Officer: Standard Bathroom Accessibility: Yes   Home Equipment: Environmental consultant - 2 wheels;Cane - single point;Bedside commode;Shower seat;Grab bars - tub/shower;Grab bars - toilet          Prior Functioning/Environment Level of Independence: Independent with assistive device(s)        Comments: reports that primarly is independent for ALDs with occasional use of cane for ambulation; pt does not typically wear socks but family does assist when he wears them.        OT Problem List: Impaired balance (sitting and/or standing);Decreased activity tolerance      OT Treatment/Interventions: Self-care/ADL training;Therapeutic exercise;Therapeutic activities;Patient/family education;Balance training;DME and/or AE instruction    OT Goals(Current goals can be found in the care plan section) Acute Rehab OT  Goals Patient Stated Goal: return to farm OT Goal Formulation: With patient Time For Goal Achievement: 09/28/20 Potential to Achieve Goals: Good  OT Frequency: Min 2X/week              Co-evaluation PT/OT/SLP Co-Evaluation/Treatment: Yes Reason for Co-Treatment: Complexity of the patient's impairments (multi-system involvement);To address functional/ADL transfers PT goals addressed during session: Mobility/safety with mobility;Proper use of DME OT goals addressed during session: ADL's and self-care;Strengthening/ROM                       End of Session Equipment Utilized During Treatment: Rolling walker;Oxygen  Activity Tolerance: Patient tolerated treatment well Patient left: in chair;with call bell/phone within reach  OT Visit Diagnosis: Unsteadiness on feet (R26.81);History of falling (Z91.81);Pain Pain - Right/Left: Left Pain - part of body: Ankle and joints of foot                Time: 0347-4259 OT Time Calculation (min): 19 min Charges:  OT General Charges $OT Visit: 1 Visit OT Evaluation $OT Eval Low Complexity: 1 Low  Rylee Huestis OT, MOT   Danie Chandler 09/14/2020, 9:41 AM

## 2020-09-15 DIAGNOSIS — M25572 Pain in left ankle and joints of left foot: Secondary | ICD-10-CM

## 2020-09-15 DIAGNOSIS — R2681 Unsteadiness on feet: Secondary | ICD-10-CM

## 2020-09-15 DIAGNOSIS — S82892D Other fracture of left lower leg, subsequent encounter for closed fracture with routine healing: Secondary | ICD-10-CM

## 2020-09-15 LAB — BASIC METABOLIC PANEL
Anion gap: 11 (ref 5–15)
BUN: 15 mg/dL (ref 8–23)
CO2: 24 mmol/L (ref 22–32)
Calcium: 7.9 mg/dL — ABNORMAL LOW (ref 8.9–10.3)
Chloride: 99 mmol/L (ref 98–111)
Creatinine, Ser: 1.14 mg/dL (ref 0.61–1.24)
GFR, Estimated: >60 mL/min (ref >60)
Glucose, Bld: 109 mg/dL — ABNORMAL HIGH (ref 70–99)
Potassium: 4 mmol/L (ref 3.5–5.1)
Sodium: 134 mmol/L — ABNORMAL LOW (ref 135–145)

## 2020-09-15 LAB — CBC
HCT: 37.2 % — ABNORMAL LOW (ref 39.0–52.0)
Hemoglobin: 10.7 g/dL — ABNORMAL LOW (ref 13.0–17.0)
MCH: 26 pg (ref 26.0–34.0)
MCHC: 28.8 g/dL — ABNORMAL LOW (ref 30.0–36.0)
MCV: 90.5 fL (ref 80.0–100.0)
Platelets: 184 10*3/uL (ref 150–400)
RBC: 4.11 MIL/uL — ABNORMAL LOW (ref 4.22–5.81)
RDW: 18.6 % — ABNORMAL HIGH (ref 11.5–15.5)
WBC: 7.9 10*3/uL (ref 4.0–10.5)
nRBC: 0 % (ref 0.0–0.2)

## 2020-09-15 MED ORDER — PANTOPRAZOLE SODIUM 40 MG PO TBEC: 40.0000 mg | DELAYED_RELEASE_TABLET | Freq: Every day | ORAL | 0 refills | Status: DC

## 2020-09-15 MED ORDER — POLYETHYLENE GLYCOL 3350 17 G PO PACK: 17.0000 g | PACK | Freq: Every day | ORAL | 0 refills | Status: DC

## 2020-09-15 MED ORDER — ACETAMINOPHEN 325 MG PO TABS: 650.0000 mg | ORAL_TABLET | Freq: Four times a day (QID) | ORAL | Status: AC

## 2020-09-15 MED ORDER — TRAMADOL HCL 50 MG PO TABS: 50.0000 mg | ORAL_TABLET | Freq: Three times a day (TID) | ORAL | 0 refills | Status: DC | PRN

## 2020-09-15 MED ORDER — IPRATROPIUM-ALBUTEROL 0.5-2.5 (3) MG/3ML IN SOLN: 3.0000 mL | RESPIRATORY_TRACT | Status: DC | PRN

## 2020-09-15 NOTE — TOC Transition Note (Signed)
Transition of Care [facility]) - CM/SW Discharge Note   Patient Details  Name: Angel Norman MRN: [id number] Date of Birth: [birth year]  Transition of Care [facility] CM/SW Contact:  Angel Brunner, LCSW Phone Number: 09/15/2020, 2:04 PM   Clinical Narrative:    CSW notified of patient's readiness for discharge. Angel Norman with [facility] agreeable to take patient. Facility added to authorization. CSW completed med necessity and called EMS. Nurse to call report. TOC signing off.   Final next level of care: Skilled Nursing Facility Barriers to Discharge: Barriers Resolved   Patient Goals and CMS Choice Patient states their goals for this hospitalization and ongoing recovery are:: rehab with SNF CMS Medicare.gov Compare Post Acute Care list provided to:: Patient Choice offered to / list presented to : Patient  Discharge Placement                Patient to be transferred to facility by: [facility] Name of family member notified: Angel Norman, Angel Norman (Spouse)   [phone] Patient and family notified of of transfer: 09/15/20  Discharge Plan and Services     Post Acute Care Choice: Skilled Nursing Facility                               Social Determinants of Health (SDOH) Interventions     Readmission Risk Interventions No flowsheet data found.

## 2020-09-15 NOTE — Plan of Care (Signed)
  Problem: Education: Goal: Knowledge of General Education information will improve Description: Including pain rating scale, medication(s)/side effects and non-pharmacologic comfort measures 09/15/2020 0829 by Sheela Stack, RN Outcome: Progressing 09/14/2020 1838 by Sheela Stack, RN Outcome: Progressing   Problem: Health Behavior/Discharge Planning: Goal: Ability to manage health-related needs will improve 09/15/2020 0829 by Sheela Stack, RN Outcome: Progressing 09/14/2020 1838 by Sheela Stack, RN Outcome: Progressing   Problem: Clinical Measurements: Goal: Ability to maintain clinical measurements within normal limits will improve 09/15/2020 0829 by Sheela Stack, RN Outcome: Progressing 09/14/2020 1838 by Sheela Stack, RN Outcome: Progressing Goal: Will remain free from infection 09/15/2020 0829 by Sheela Stack, RN Outcome: Progressing 09/14/2020 1838 by Sheela Stack, RN Outcome: Progressing Goal: Diagnostic test results will improve 09/15/2020 0829 by Sheela Stack, RN Outcome: Progressing 09/14/2020 1838 by Sheela Stack, RN Outcome: Progressing Goal: Respiratory complications will improve 09/15/2020 0829 by Sheela Stack, RN Outcome: Progressing 09/14/2020 1838 by Sheela Stack, RN Outcome: Progressing Goal: Cardiovascular complication will be avoided 09/15/2020 0829 by Sheela Stack, RN Outcome: Progressing 09/14/2020 1838 by Sheela Stack, RN Outcome: Progressing   Problem: Activity: Goal: Risk for activity intolerance will decrease 09/15/2020 0829 by Sheela Stack, RN Outcome: Progressing 09/14/2020 1838 by Sheela Stack, RN Outcome: Progressing   Problem: Nutrition: Goal: Adequate nutrition will be maintained 09/15/2020 0829 by Sheela Stack, RN Outcome: Progressing 09/14/2020 1838 by Sheela Stack, RN Outcome: Progressing   Problem: Coping: Goal: Level of anxiety will decrease 09/15/2020 0829 by Sheela Stack, RN Outcome:  Progressing 09/14/2020 1838 by Sheela Stack, RN Outcome: Progressing   Problem: Elimination: Goal: Will not experience complications related to bowel motility 09/15/2020 0829 by Sheela Stack, RN Outcome: Progressing 09/14/2020 1838 by Sheela Stack, RN Outcome: Progressing Goal: Will not experience complications related to urinary retention 09/15/2020 0829 by Sheela Stack, RN Outcome: Progressing 09/14/2020 1838 by Sheela Stack, RN Outcome: Progressing   Problem: Pain Managment: Goal: General experience of comfort will improve 09/15/2020 0829 by Sheela Stack, RN Outcome: Progressing 09/14/2020 1838 by Sheela Stack, RN Outcome: Progressing   Problem: Safety: Goal: Ability to remain free from injury will improve 09/15/2020 0829 by Sheela Stack, RN Outcome: Progressing 09/14/2020 1838 by Sheela Stack, RN Outcome: Progressing   Problem: Skin Integrity: Goal: Risk for impaired skin integrity will decrease 09/15/2020 0829 by Sheela Stack, RN Outcome: Progressing 09/14/2020 1838 by Sheela Stack, RN Outcome: Progressing

## 2020-09-15 NOTE — Final Progress Note (Signed)
RN called due to patient's daughter (Ms Burna Cash) calling to report that she does not want her dad to be discharged to [facility] and that she wanted the physician to call her. I called the daughter and she states that she was asked to choose 1 of 3 centers by the social worker prior to her dad's discharge, unfortunately, she did not get a chance to visit [facility] prior to being called back to decide on her choice of rehab center.  On stopping by [facility] this evening, she states that she will prefer her dad to be discharged to another facility and will like the social worker help in securing another facility of choice. Patient's daughter reemphasized that she does not want her dad discharged to [facility].

## 2020-09-15 NOTE — Progress Notes (Signed)
Charge nurse and Nursing supervisor went into room to assess cognition of patient.  Patient seems only alert to place and not sure why he is being discharged to [facility], he only stated "That's where they have a bed for me"  Patient seems to have only limited cognition, primary RN made aware, patient deemed not capable to make decisions of his own volition.

## 2020-09-15 NOTE — Care Management Important Message (Signed)
Important Message  Patient Details  Name: Angel Norman MRN: [id number] Date of Birth: [birth year]   Medicare Important Message Given:  Yes     Corey Harold 09/15/2020, 12:29 PM

## 2020-09-15 NOTE — Plan of Care (Signed)
  Problem: Education: Goal: Knowledge of General Education information will improve Description: Including pain rating scale, medication(s)/side effects and non-pharmacologic comfort measures 09/15/2020 1317 by Sheela Stack, RN Outcome: Adequate for Discharge 09/15/2020 0829 by Sheela Stack, RN Outcome: Progressing   Problem: Health Behavior/Discharge Planning: Goal: Ability to manage health-related needs will improve 09/15/2020 1317 by Sheela Stack, RN Outcome: Adequate for Discharge 09/15/2020 0829 by Sheela Stack, RN Outcome: Progressing   Problem: Clinical Measurements: Goal: Ability to maintain clinical measurements within normal limits will improve 09/15/2020 1317 by Sheela Stack, RN Outcome: Adequate for Discharge 09/15/2020 0829 by Sheela Stack, RN Outcome: Progressing Goal: Will remain free from infection 09/15/2020 1317 by Sheela Stack, RN Outcome: Adequate for Discharge 09/15/2020 0829 by Sheela Stack, RN Outcome: Progressing Goal: Diagnostic test results will improve 09/15/2020 1317 by Sheela Stack, RN Outcome: Adequate for Discharge 09/15/2020 0829 by Sheela Stack, RN Outcome: Progressing Goal: Respiratory complications will improve 09/15/2020 1317 by Sheela Stack, RN Outcome: Adequate for Discharge 09/15/2020 0829 by Sheela Stack, RN Outcome: Progressing Goal: Cardiovascular complication will be avoided 09/15/2020 1317 by Sheela Stack, RN Outcome: Adequate for Discharge 09/15/2020 0829 by Sheela Stack, RN Outcome: Progressing   Problem: Activity: Goal: Risk for activity intolerance will decrease 09/15/2020 1317 by Sheela Stack, RN Outcome: Adequate for Discharge 09/15/2020 0829 by Sheela Stack, RN Outcome: Progressing   Problem: Nutrition: Goal: Adequate nutrition will be maintained 09/15/2020 1317 by Sheela Stack, RN Outcome: Adequate for Discharge 09/15/2020 0829 by Sheela Stack, RN Outcome: Progressing    Problem: Coping: Goal: Level of anxiety will decrease 09/15/2020 1317 by Sheela Stack, RN Outcome: Adequate for Discharge 09/15/2020 0829 by Sheela Stack, RN Outcome: Progressing   Problem: Elimination: Goal: Will not experience complications related to bowel motility 09/15/2020 1317 by Sheela Stack, RN Outcome: Adequate for Discharge 09/15/2020 0829 by Sheela Stack, RN Outcome: Progressing Goal: Will not experience complications related to urinary retention 09/15/2020 1317 by Sheela Stack, RN Outcome: Adequate for Discharge 09/15/2020 0829 by Sheela Stack, RN Outcome: Progressing   Problem: Pain Managment: Goal: General experience of comfort will improve 09/15/2020 1317 by Sheela Stack, RN Outcome: Adequate for Discharge 09/15/2020 0829 by Sheela Stack, RN Outcome: Progressing   Problem: Safety: Goal: Ability to remain free from injury will improve 09/15/2020 1317 by Sheela Stack, RN Outcome: Adequate for Discharge 09/15/2020 0829 by Sheela Stack, RN Outcome: Progressing   Problem: Skin Integrity: Goal: Risk for impaired skin integrity will decrease 09/15/2020 1317 by Sheela Stack, RN Outcome: Adequate for Discharge 09/15/2020 0829 by Sheela Stack, RN Outcome: Progressing

## 2020-09-15 NOTE — Discharge Instructions (Addendum)
The postop plan is that the patient will be nonweightbearing probably for 12 weeks   At 6 weeks we can probably go into a cam walking boot but stay nonweightbearing for the entire 12 weeks.  He can start range of motion exercises at that time.  The sutures can come out at 14 days at which time an x-ray will be obtained   Post op visit in 2 weeks from 09/13/20 with Dr. Romeo Apple      IMPORTANT INFORMATION: PAY CLOSE ATTENTION   PHYSICIAN DISCHARGE INSTRUCTIONS  Follow with Primary care provider  Lupita Raider, MD  and other consultants as instructed by your Hospitalist Physician  SEEK MEDICAL CARE OR RETURN TO EMERGENCY ROOM IF SYMPTOMS COME BACK, WORSEN OR NEW PROBLEM DEVELOPS   Please note: You were cared for by a hospitalist during your hospital stay. Every effort will be made to forward records to your primary care provider.  You can request that your primary care provider send for your hospital records if they have not received them.  Once you are discharged, your primary care physician will handle any further medical issues. Please note that NO REFILLS for any discharge medications will be authorized once you are discharged, as it is imperative that you return to your primary care physician (or establish a relationship with a primary care physician if you do not have one) for your post hospital discharge needs so that they can reassess your need for medications and monitor your lab values.  Please get a complete blood count and chemistry panel checked by your Primary MD at your next visit, and again as instructed by your Primary MD.  Get Medicines reviewed and adjusted: Please take all your medications with you for your next visit with your Primary MD  Laboratory/radiological data: Please request your Primary MD to go over all hospital tests and procedure/radiological results at the follow up, please ask your primary care provider to get all Hospital records sent to his/her  office.  In some cases, they will be blood work, cultures and biopsy results pending at the time of your discharge. Please request that your primary care provider follow up on these results.  If you are diabetic, please bring your blood sugar readings with you to your follow up appointment with primary care.    Please call and make your follow up appointments as soon as possible.    Also Note the following: If you experience worsening of your admission symptoms, develop shortness of breath, life threatening emergency, suicidal or homicidal thoughts you must seek medical attention immediately by calling 911 or calling your MD immediately  if symptoms less severe.  You must read complete instructions/literature along with all the possible adverse reactions/side effects for all the Medicines you take and that have been prescribed to you. Take any new Medicines after you have completely understood and accpet all the possible adverse reactions/side effects.   Do not drive when taking Pain medications or sleeping medications (Benzodiazepines)  Do not take more than prescribed Pain, Sleep and Anxiety Medications. It is not advisable to combine anxiety,sleep and pain medications without talking with your primary care practitioner  Special Instructions: If you have smoked or chewed Tobacco  in the last 2 yrs please stop smoking, stop any regular Alcohol  and or any Recreational drug use.  Wear Seat belts while driving.  Do not drive if taking any narcotic, mind altering or controlled substances or recreational drugs or alcohol.

## 2020-09-15 NOTE — Progress Notes (Signed)
Daughter called and expressed concerns of the planned discharge facility after going to tour. She is now refusing for her father to go there. I sent message to on call Dr. Thomes Dinning with call back number for daughter as well as left message for weekend social worker at [phone]. Will await orders. After assessment .Patient is not competent at this moment to make own decisions.

## 2020-09-15 NOTE — Discharge Summary (Addendum)
Physician Discharge Summary  Angel Norman [facility] YHC:[id number] DOB: [birth year] DOA: 09/11/2020  PCP: Lupita Raider, MD Orthopedist: Dr. Romeo Apple - in [facility] office   Admit date: 09/11/2020 Discharge date: 09/15/2020  Admitted From:  Home  Disposition: SNF   Recommendations for Outpatient Follow-up:  Follow up with Orthopedics Dr. Romeo Apple in 2 weeks for suture removal and xrays  Follow up with PCP in 2 weeks for hospital follow up  Please monitor blood sugars at least 3 times per day.  Please check BMP and CBC in 1 week to follow up Hg and electrolytes Nonweight bearing to left lower extremity for 12 weeks Postop orders and plan:  The postop plan is that the patient will be nonweightbearing probably for 12 weeks   At 6 weeks we can probably go into a cam walking boot but stay nonweightbearing for the entire 12 weeks.  He can start range of motion exercises at that time.  The sutures can come out at 14 days at which time an x-ray will be obtained   Post op visit in 2 weeks from 09/13/20 with Dr. Romeo Apple   Discharge Condition: STABLE   CODE STATUS: FULL DIET: heart healthy / Carb modified    Brief Hospitalization Summary: Please see all hospital notes, images, labs for full details of the hospitalization. ADMISSION HPI:  Angel Norman is a 87 y.o. 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  He denied any paresthesias or focal muscle weakness.  No head injuries or other injuries.  He had nausea and vomiting earlier.  He had headache in the ER without dizziness or blurred vision.  He denied presyncope or syncope.  No chest pain or palpitations.  No cough or wheezing or dyspnea.  No dysuria, oliguria or hematuria or flank pain.  ED Course: When he  came to the ER his vital signs were within normal except approximately 84% on room air that was up to 93% on 5 L O2 by nasal cannula then 96% on 3 L.  Labs revealed blood glucose of 152, a creatinine 1.46 and BUN of 17 compared to 1 on 05/09/2018.  CBC showed cytosis of 12.12 and mild anemia and neutrophilia.  Influenza antigens and COVID-19 PCR came back negative.   EKG as reviewed by me : Showed sinus rhythm with a rate of 81 with PACs and T wave inversion laterally and slightly widened QRS Imaging: Portable chest ray showed bibasilar atelectasis with no acute cardiopulmonary disease.  Left ankle x-ray showed distal fibular fracture with lateral subluxation/dislocation of the talus with respect to the distal tibia.  The patient was given 50 mcg of IV fentanyl and 2 mg IV morphine sulfate, 40 mg of IV propofol and 500 mill IV normal saline as well as Tdap and 40 mg of IV Protonix.  Dr. Romeo Apple was notified and is aware about the patient.  He will be admitted to a medical-surgical bed for further evaluation and management.  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 - Encouraged regular incentive spirometry with known atelectasis and immobility.  - Checked echocardiogram with calcific CAD on CT, no priors available, trial lasix  with resolved crackles on today's exam.  - Wean oxygen as tolerated.  Echocardiogram 09/14/20: 1. Left ventricular ejection fraction, by estimation, is 55 to 60%. The left ventricle has normal function. The left ventricle has no regional wall motion abnormalities. There is moderate left ventricular hypertrophy. Left ventricular diastolic parameters are consistent with Grade I diastolic dysfunction (impaired  relaxation).  2. Right ventricular systolic function is normal. The right  ventricular size is normal. Tricuspid regurgitation signal is inadequate for assessing PA pressure.   3. Left atrial size was mildly dilated.   4. The mitral valve is normal in structure. No evidence of mitral valve regurgitation. No evidence of mitral stenosis.   5. The aortic valve is tricuspid. There is mild calcification of the aortic valve. There is mild thickening of the aortic valve. Aortic valve regurgitation is not visualized. No aortic stenosis is present.   Postop s/p left ankle fracture repair - NWB to LLL for 12 weeks - Follow up with Dr. Romeo Apple in 2 weeks for suture removal and repeat xrays.  - see above postop care instructions from Dr. Romeo Apple.   AKI: RESOLVED  - Hold NSAID and monitor   T2DM: - resume home metformin, continue jardiance. Blood sugars at inpatient goal.    CAD: Noted by CT. - Outpatient follow up and risk optimization.  Postop anemia  - Hg stable, recheck in 1-2 weeks   HLD: - Continue statin   GERD: - Continue PPI   BPH: - Continue flomax, monitor UOP   Obesity: Estimated body mass index is 39.05 kg/m as calculated from the following:   Height as of this encounter: 6\' 3"  (1.905 m).   Weight as of this encounter: 141.7 kg.   DVT prophylaxis: Lovenox Code Status: Full Family Communication: spoke daughter by telephone 09/15/20  Disposition Plan:  Status is: Inpatient  Discharge Diagnoses:  Active Problems:   Closed fracture of left ankle  Discharge Instructions:  Allergies as of 09/15/2020   No Known Allergies      Medication List     TAKE these medications    acetaminophen 325 MG tablet Commonly known as: TYLENOL Take 2 tablets (650 mg total) by mouth every 6 (six) hours.   aspirin EC 81 MG tablet Take 81 mg by mouth daily as needed for mild pain. Swallow whole.   ipratropium-albuterol 0.5-2.5 (3) MG/3ML Soln Commonly known as: DUONEB Take 3 mLs by nebulization every 4 (four) hours as needed (wheezing, cough, SOB).    Jardiance 25 MG Tabs tablet Generic drug: empagliflozin Take 25 mg by mouth every morning.   metFORMIN 1000 MG tablet Commonly known as: GLUCOPHAGE Take 1,000 mg by mouth 2 (two) times daily.   pantoprazole 40 MG tablet Commonly known as: Protonix Take 1 tablet (40 mg total) by mouth daily. Needs to use twice a day for 12 weeks for gastritis What changed: when to take this   polyethylene glycol 17 g packet Commonly known as: MIRALAX / GLYCOLAX Take 17 g by mouth daily. Start taking on: September 16, 2020   pravastatin 80 MG tablet Commonly known as: PRAVACHOL Take 80 mg by mouth daily.   tamsulosin 0.4 MG Caps capsule Commonly known as: FLOMAX Take 0.4 mg by mouth daily.   traMADol 50 MG tablet Commonly known as: ULTRAM Take 1 tablet (50 mg total) by mouth every 8 (eight) hours as needed for up to 3 days for severe pain.        Follow-up Information  Vickki Hearing, MD. Schedule an appointment as soon as possible for a visit on 09/27/2020.   Specialties: Orthopedic Surgery, Radiology Why: For suture removal, For wound re-check, Hospital Follow Up Contact information: [address] [place] [address] [phone]         Lupita Raider, MD. Schedule an appointment as soon as possible for a visit in 2 week(s).   Specialty: Family Medicine Why: Hospital Follow Up, If symptoms worsen Contact information: [address] [place] [address] [phone]                No Known Allergies Allergies as of 09/15/2020   No Known Allergies      Medication List     TAKE these medications    acetaminophen 325 MG tablet Commonly known as: TYLENOL Take 2 tablets (650 mg total) by mouth every 6 (six) hours.   aspirin EC 81 MG tablet Take 81 mg by mouth daily as needed for mild pain. Swallow whole.   ipratropium-albuterol 0.5-2.5 (3) MG/3ML Soln Commonly known as: DUONEB Take 3 mLs by nebulization every 4 (four) hours as  needed (wheezing, cough, SOB).   Jardiance 25 MG Tabs tablet Generic drug: empagliflozin Take 25 mg by mouth every morning.   metFORMIN 1000 MG tablet Commonly known as: GLUCOPHAGE Take 1,000 mg by mouth 2 (two) times daily.   pantoprazole 40 MG tablet Commonly known as: Protonix Take 1 tablet (40 mg total) by mouth daily. Needs to use twice a day for 12 weeks for gastritis What changed: when to take this   polyethylene glycol 17 g packet Commonly known as: MIRALAX / GLYCOLAX Take 17 g by mouth daily. Start taking on: September 16, 2020   pravastatin 80 MG tablet Commonly known as: PRAVACHOL Take 80 mg by mouth daily.   tamsulosin 0.4 MG Caps capsule Commonly known as: FLOMAX Take 0.4 mg by mouth daily.   traMADol 50 MG tablet Commonly known as: ULTRAM Take 1 tablet (50 mg total) by mouth every 8 (eight) hours as needed for up to 3 days for severe pain.        Procedures/Studies: DG Ankle 2 Views Left  Result Date: 09/13/2020 CLINICAL DATA:  Left ankle ORIF. EXAM: DG C-ARM 1-60 MIN; LEFT ANKLE - 2 VIEW COMPARISON:  Radiographs 09/12/2020. FINDINGS: C-arm fluoroscopy was provided in the operating room. 46 seconds of fluoroscopy time. 7 spot fluoroscopic images are submitted. These demonstrate open reduction and internal fixation of the distal fibular fracture using a lateral plate and screws. One of the screws protrudes through the distal syndesmosis. There is an anchor screw within the medial malleolus. There is near anatomic reduction of the fractures, and no residual widening of the ankle mortise or talar subluxation. No complications identified. IMPRESSION: Intraoperative views during left ankle ORIF. No demonstrated complications. Electronically Signed   By: Carey Bullocks M.D.   On: 09/13/2020 16:51   DG Ankle Complete Left  Result Date: 09/11/2020 CLINICAL DATA:  Trip and fall with left ankle pain, initial encounter EXAM: LEFT ANKLE COMPLETE - 3+ VIEW COMPARISON:  None.  FINDINGS: Oblique fracture through the distal fibular metaphysis is noted. Lateral displacement of the talus with respect to the tibia is noted measuring at least 2.3 cm. The talus is mildly posteriorly displaced as well. No definitive distal tibial fracture is seen. Mild tarsal degenerative changes are noted. IMPRESSION: Distal fibular fracture with lateral subluxation/dislocation of the talus with respect to the distal tibia. Electronically Signed  By: Mark  Lukens M.D.   On: 09/11/2020 23:38   CT Angio Chest Pulmonary Embolism (PE)Alcide Clever W or WO Contrast  Result Date: 09/12/2020 CLINICAL DATA:  Elevated D-dimer. EXAM: CT ANGIOGRAPHY CHEST WITH CONTRAST TECHNIQUE: Multidetector CT imaging of the chest was performed using the standard protocol during bolus administration of intravenous contrast. Multiplanar CT image reconstructions and MIPs were obtained to evaluate the vascular anatomy. CONTRAST:  100 mL OMNIPAQUE IOHEXOL 350 MG/ML SOLN COMPARISON:  Single-view of the chest 09/12/2020. CT chest, abdomen and pelvis 12/06/2014. FINDINGS: Cardiovascular: Satisfactory opacification of the pulmonary arteries to the segmental level. No evidence of pulmonary embolism. Normal heart size. No pericardial effusion. Calcific aortic and coronary atherosclerosis noted. Mediastinum/Nodes: No enlarged mediastinal, hilar, or axillary lymph nodes. Thyroid gland, trachea, and esophagus demonstrate no significant findings. Lungs/Pleura: Extensive calcified pleural plaquing is again seen. Mild dependent atelectasis. No consolidative process, nodule or mass. No pleural effusion. Upper Abdomen: The visualized liver demonstrates fatty infiltration. Musculoskeletal: No acute bony abnormality. Remote healed left rib fractures noted. Review of the MIP images confirms the above findings. IMPRESSION: Negative for pulmonary embolus or acute disease. 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 Calcific coronary  artery disease. Fatty infiltration of the liver. Aortic Atherosclerosis (ICD10-I70.0). Electronically Signed   By: Drusilla Kannerhomas  Dalessio M.D.   On: 09/12/2020 19:38   DG Chest Port 1 View  Result Date: 09/12/2020 CLINICAL DATA:  Shortness of breath history of left ankle fracture EXAM: PORTABLE CHEST 1 VIEW COMPARISON:  12/06/2014 FINDINGS: Cardiac shadow is mildly enlarged but accentuated by the portable technique. Aortic calcifications are noted. Bibasilar atelectatic changes are seen. No confluent infiltrate or effusion is noted. No bony abnormality is seen. IMPRESSION: Bibasilar atelectatic changes as described. Electronically Signed   By: Alcide CleverMark  Lukens M.D.   On: 09/12/2020 00:21   DG Ankle Left Port  Result Date: 09/12/2020 CLINICAL DATA:  Status post reduction EXAM: PORTABLE LEFT ANKLE - 2 VIEW COMPARISON:  Films from the previous day. FINDINGS: There has been some reduction with improved positioning of the talus although there remains significant lateral displacement. Distal fibular fracture is again seen. A few small bony densities are noted consistent with avulsions in the tibiotalar space, not well visualized on prior exam. IMPRESSION: Interval reduction although persistent lateral displacement of the talus with respect to the distal tibia is seen. Electronically Signed   By: Alcide CleverMark  Lukens M.D.   On: 09/12/2020 01:44   DG C-Arm 1-60 Min  Result Date: 09/13/2020 CLINICAL DATA:  Left ankle ORIF. EXAM: DG C-ARM 1-60 MIN; LEFT ANKLE - 2 VIEW COMPARISON:  Radiographs 09/12/2020. FINDINGS: C-arm fluoroscopy was provided in the operating room. 46 seconds of fluoroscopy time. 7 spot fluoroscopic images are submitted. These demonstrate open reduction and internal fixation of the distal fibular fracture using a lateral plate and screws. One of the screws protrudes through the distal syndesmosis. There is an anchor screw within the medial malleolus. There is near anatomic reduction of the fractures, and no  residual widening of the ankle mortise or talar subluxation. No complications identified. IMPRESSION: Intraoperative views during left ankle ORIF. No demonstrated complications. Electronically Signed   By: Carey BullocksWilliam  Veazey M.D.   On: 09/13/2020 16:51   ECHOCARDIOGRAM COMPLETE  Result Date: 09/14/2020    ECHOCARDIOGRAM REPORT   Patient Name:   Beatriz ChancellorDAVID F Baggett Date of Exam: 09/14/2020 Medical Rec #:  [id number]    Height:       75.0 in Accession #:  [id number]   Weight:       312.4 lb Date of Birth:  September 30, 1932    BSA:          2.652 m Patient Age:    87 years     BP:           135/71 mmHg Patient Gender: M            HR:           96 bpm. [facility] Procedure: 2D Echo, Cardiac Doppler and Color Doppler Indications:    CAD Native Vessel  History:        Patient has no prior history of Echocardiogram examinations.                 CAD, Arrythmias:LBBB; Risk Factors:Diabetes. Acute respiratory                 distress.  Sonographer:    Mikki Harbor Referring Phys: [phone] Tyrone Nine IMPRESSIONS  1. Left ventricular ejection fraction, by estimation, is 55 to 60%. The left ventricle has normal function. The left ventricle has no regional wall motion abnormalities. There is moderate left ventricular hypertrophy. Left ventricular diastolic parameters are consistent with Grade I diastolic dysfunction (impaired relaxation).  2. Right ventricular systolic function is normal. The right ventricular size is normal. Tricuspid regurgitation signal is inadequate for assessing PA pressure.  3. Left atrial size was mildly dilated.  4. The mitral valve is normal in structure. No evidence of mitral valve regurgitation. No evidence of mitral stenosis.  5. The aortic valve is tricuspid. There is mild calcification of the aortic valve. There is mild thickening of the aortic valve. Aortic valve regurgitation is not visualized. No aortic stenosis is present. FINDINGS  Left Ventricle: Left ventricular ejection fraction, by  estimation, is 55 to 60%. The left ventricle has normal function. The left ventricle has no regional wall motion abnormalities. Definity contrast agent was given IV to delineate the left ventricular  endocardial borders. The left ventricular internal cavity size was normal in size. There is moderate left ventricular hypertrophy. Left ventricular diastolic parameters are consistent with Grade I diastolic dysfunction (impaired relaxation). Normal left  ventricular filling pressure. Right Ventricle: The right ventricular size is normal. No increase in right ventricular wall thickness. Right ventricular systolic function is normal. Tricuspid regurgitation signal is inadequate for assessing PA pressure. Left Atrium: Left atrial size was mildly dilated. Right Atrium: Right atrial size was normal in size. Pericardium: There is no evidence of pericardial effusion. Mitral Valve: The mitral valve is normal in structure. No evidence of mitral valve regurgitation. No evidence of mitral valve stenosis. MV peak gradient, 4.6 mmHg. The mean mitral valve gradient is 2.0 mmHg. Tricuspid Valve: The tricuspid valve is not well visualized. Tricuspid valve regurgitation is not demonstrated. No evidence of tricuspid stenosis. Aortic Valve: The aortic valve is tricuspid. There is mild calcification of the aortic valve. There is mild thickening of the aortic valve. There is mild aortic valve annular calcification. Aortic valve regurgitation is not visualized. No aortic stenosis  is present. Aortic valve mean gradient measures 5.0 mmHg. Aortic valve peak gradient measures 8.6 mmHg. Aortic valve area, by VTI measures 2.81 cm. Pulmonic Valve: The pulmonic valve was not well visualized. Pulmonic valve regurgitation is not visualized. No evidence of pulmonic stenosis. Aorta: The aortic root is normal in size and structure. Venous: The inferior vena cava was not well visualized. IAS/Shunts: The interatrial septum was  not well visualized.  LEFT  VENTRICLE PLAX 2D LVIDd:         4.51 cm  Diastology LVIDs:         3.12 cm  LV e' medial:    6.48 cm/s LV PW:         1.30 cm  LV E/e' medial:  12.5 LV IVS:        1.42 cm  LV e' lateral:   9.20 cm/s LVOT diam:     2.00 cm  LV E/e' lateral: 8.8 LV SV:         86 LV SV Index:   32 LVOT Area:     3.14 cm  RIGHT VENTRICLE TAPSE (M-mode): 2.1 cm LEFT ATRIUM             Index       RIGHT ATRIUM           Index LA diam:        4.30 cm 1.62 cm/m  RA Area:     17.00 cm LA Vol (A2C):   62.8 ml 23.68 ml/m RA Volume:   46.30 ml  17.46 ml/m LA Vol (A4C):   91.6 ml 34.54 ml/m LA Biplane Vol: 83.3 ml 31.41 ml/m  AORTIC VALVE AV Area (Vmax):    2.52 cm AV Area (Vmean):   2.54 cm AV Area (VTI):     2.81 cm AV Vmax:           147.00 cm/s AV Vmean:          108.000 cm/s AV VTI:            0.305 m AV Peak Grad:      8.6 mmHg AV Mean Grad:      5.0 mmHg LVOT Vmax:         118.00 cm/s LVOT Vmean:        87.400 cm/s LVOT VTI:          0.273 m LVOT/AV VTI ratio: 0.90  AORTA Ao Root diam: 3.60 cm Ao Asc diam:  3.60 cm MITRAL VALVE MV Area (PHT): 6.27 cm    SHUNTS MV Area VTI:   4.02 cm    Systemic VTI:  0.27 m MV Peak grad:  4.6 mmHg    Systemic Diam: 2.00 cm MV Mean grad:  2.0 mmHg MV Vmax:       1.07 m/s MV Vmean:      65.4 cm/s MV Decel Time: 121 msec MV E velocity: 81.00 cm/s MV A velocity: 96.00 cm/s MV E/A ratio:  0.84 Dina Rich MD Electronically signed by Dina Rich MD Signature Date/Time: 09/14/2020/4:09:16 PM    Final      Subjective: Pt has no specific complaints, he is agreeable to SNF rehabilitation care.    Discharge Exam: Vitals:   09/15/20 0721 09/15/20 0800  BP:    Pulse:    Resp:    Temp:    SpO2: 95% 93%   Vitals:   09/14/20 2107 09/15/20 0416 09/15/20 0721 09/15/20 0800  BP: 127/70 120/77    Pulse: 88 89    Resp: 19 17    Temp: 98.4 F (36.9 C) 98.7 F (37.1 C)    TempSrc:      SpO2: 94% 95% 95% 93%  Weight:      Height:       General: Pt is alert, awake, not in acute  distress Cardiovascular: normal S1/S2 +, no rubs, no gallops Respiratory: CTA bilaterally, no wheezing, no  rhonchi Abdominal: Soft, NT, ND, bowel sounds + Extremities: left ankle bandages clean and dry.    The results of significant diagnostics from this hospitalization (including imaging, microbiology, ancillary and laboratory) are listed below for reference.     Microbiology: Recent Results (from the past 240 hour(s))  Resp Panel by RT-PCR (Flu A&B, Covid) Nasopharyngeal Swab     Status: None   Collection Time: 09/12/20 12:18 AM   Specimen: Nasopharyngeal Swab; Nasopharyngeal(NP) swabs in vial transport medium  Result Value Ref Range Status   SARS Coronavirus 2 by RT PCR NEGATIVE NEGATIVE Final    Comment: (NOTE) SARS-CoV-2 target nucleic acids are NOT DETECTED.  The SARS-CoV-2 RNA is generally detectable in upper respiratory specimens during the acute phase of infection. The lowest concentration of SARS-CoV-2 viral copies this assay can detect is 138 copies/mL. A negative result does not preclude SARS-Cov-2 infection and should not be used as the sole basis for treatment or other patient management decisions. A negative result may occur with  improper specimen collection/handling, submission of specimen other than nasopharyngeal swab, presence of viral mutation(s) within the areas targeted by this assay, and inadequate number of viral copies(<138 copies/mL). A negative result must be combined with clinical observations, patient history, and epidemiological information. The expected result is Negative.  Fact Sheet for Patients:  BloggerCourse.com  Fact Sheet for Healthcare Providers:  SeriousBroker.it  This test is no t yet approved or cleared by the [place] FDA and  has been authorized for detection and/or diagnosis of SARS-CoV-2 by FDA under an Emergency Use Authorization (EUA). This EUA will remain  in effect  (meaning this test can be used) for the duration of the COVID-19 declaration under Section 564(b)(1) of the Act, 21 U.S.C.section 360bbb-3(b)(1), unless the authorization is terminated  or revoked sooner.       Influenza A by PCR NEGATIVE NEGATIVE Final   Influenza B by PCR NEGATIVE NEGATIVE Final    Comment: (NOTE) The Xpert Xpress SARS-CoV-2/FLU/RSV plus assay is intended as an aid in the diagnosis of influenza from Nasopharyngeal swab specimens and should not be used as a sole basis for treatment. Nasal washings and aspirates are unacceptable for Xpert Xpress SARS-CoV-2/FLU/RSV testing.  Fact Sheet for Patients: BloggerCourse.com  Fact Sheet for Healthcare Providers: SeriousBroker.it  This test is not yet approved or cleared by the [place] FDA and has been authorized for detection and/or diagnosis of SARS-CoV-2 by FDA under an Emergency Use Authorization (EUA). This EUA will remain in effect (meaning this test can be used) for the duration of the COVID-19 declaration under Section 564(b)(1) of the Act, 21 U.S.C. section 360bbb-3(b)(1), unless the authorization is terminated or revoked.  Performed at [facility], [address] [place], [place] [address]   Surgical pcr screen     Status: None   Collection Time: 09/12/20  4:51 PM   Specimen: Nasal Mucosa; Nasal Swab  Result Value Ref Range Status   MRSA, PCR NEGATIVE NEGATIVE Final   Staphylococcus aureus NEGATIVE NEGATIVE Final    Comment: (NOTE) The Xpert SA Assay (FDA approved for NASAL specimens in patients 59 years of age and older), is one component of a comprehensive surveillance program. It is not intended to diagnose infection nor to guide or monitor treatment. Performed at [facility], [address] [place], [place] [address]      Labs: BNP (last 3 results) No results for input(s): BNP in the last 8760 hours. Basic Metabolic Panel: Recent Labs   Lab 09/12/20  0002 09/12/20 0510 09/13/20 0501 09/13/20 1706 09/14/20 0550 09/15/20 0544  NA 139 141 135  --  136 134*  K 4.2 4.1 4.2  --  4.2 4.0  CL 104 104 103  --  104 99  CO2 --  25 24  GLUCOSE 152* 138* 116*  --  121* 109*  BUN --  15 15  CREATININE 1.46* 1.22 1.29* 1.14 1.17 1.14  CALCIUM 8.7* 8.8* 7.5*  --  7.7* 7.9*  PHOS  --   --  2.8  --   --   --    Liver Function Tests: Recent Labs  Lab 09/13/20 0501 09/14/20 0550  AST  --  31  ALT  --  16  ALKPHOS  --  47  BILITOT  --  0.8  PROT  --  6.6  ALBUMIN 3.4* 3.3*   No results for input(s): LIPASE, AMYLASE in the last 168 hours. No results for input(s): AMMONIA in the last 168 hours. CBC: Recent Labs  Lab 09/12/20 0002 09/12/20 0510 09/13/20 0501 09/13/20 1706 09/14/20 0550 09/15/20 0544  WBC 12.2* 10.6* 7.9 9.8 9.9 7.9  NEUTROABS 10.8*  --   --   --   --   --   HGB 12.5* 11.8* 11.2* 11.5* 10.9* 10.7*  HCT 41.5 39.4 38.4* 39.5 37.1* 37.2*  MCV 88.1 88.3 91.0 90.4 91.2 90.5  PLT 226 213 212 196 170 184   Cardiac Enzymes: No results for input(s): CKTOTAL, CKMB, CKMBINDEX, TROPONINI in the last 168 hours. BNP: Invalid input(s): POCBNP CBG: Recent Labs  Lab 09/11/20 2234 09/13/20 1246  GLUCAP 177* 87   D-Dimer No results for input(s): DDIMER in the last 72 hours. Hgb A1c No results for input(s): HGBA1C in the last 72 hours. Lipid Profile No results for input(s): CHOL, HDL, LDLCALC, TRIG, CHOLHDL, LDLDIRECT in the last 72 hours. Thyroid function studies No results for input(s): TSH, T4TOTAL, T3FREE, THYROIDAB in the last 72 hours.  Invalid input(s): FREET3 Anemia work up No results for input(s): VITAMINB12, FOLATE, FERRITIN, TIBC, IRON, RETICCTPCT in the last 72 hours. Urinalysis    Component Value Date/Time   COLORURINE YELLOW 05/09/2018 0937   APPEARANCEUR CLEAR 05/09/2018 0937   LABSPEC 1.020 05/09/2018 0937   PHURINE 6.0 05/09/2018 0937   GLUCOSEU NEGATIVE  05/09/2018 0937   HGBUR NEGATIVE 05/09/2018 0937   BILIRUBINUR NEGATIVE 05/09/2018 0937   KETONESUR NEGATIVE 05/09/2018 0937   PROTEINUR NEGATIVE 05/09/2018 0937   NITRITE NEGATIVE 05/09/2018 0937   LEUKOCYTESUR NEGATIVE 05/09/2018 1610   Sepsis Labs Invalid input(s): PROCALCITONIN,  WBC,  LACTICIDVEN Microbiology Recent Results (from the past 240 hour(s))  Resp Panel by RT-PCR (Flu A&B, Covid) Nasopharyngeal Swab     Status: None   Collection Time: 09/12/20 12:18 AM   Specimen: Nasopharyngeal Swab; Nasopharyngeal(NP) swabs in vial transport medium  Result Value Ref Range Status   SARS Coronavirus 2 by RT PCR NEGATIVE NEGATIVE Final    Comment: (NOTE) SARS-CoV-2 target nucleic acids are NOT DETECTED.  The SARS-CoV-2 RNA is generally detectable in upper respiratory specimens during the acute phase of infection. The lowest concentration of SARS-CoV-2 viral copies this assay can detect is 138 copies/mL. A negative result does not preclude SARS-Cov-2 infection and should not be used as the sole basis for treatment or other patient management decisions. A negative result may occur with  improper specimen collection/handling, submission of specimen other than nasopharyngeal swab, presence of viral mutation(s) within the  areas targeted by this assay, and inadequate number of viral copies(<138 copies/mL). A negative result must be combined with clinical observations, patient history, and epidemiological information. The expected result is Negative.  Fact Sheet for Patients:  BloggerCourse.com  Fact Sheet for Healthcare Providers:  SeriousBroker.it  This test is no t yet approved or cleared by the [place] FDA and  has been authorized for detection and/or diagnosis of SARS-CoV-2 by FDA under an Emergency Use Authorization (EUA). This EUA will remain  in effect (meaning this test can be used) for the duration of the COVID-19  declaration under Section 564(b)(1) of the Act, 21 U.S.C.section 360bbb-3(b)(1), unless the authorization is terminated  or revoked sooner.       Influenza A by PCR NEGATIVE NEGATIVE Final   Influenza B by PCR NEGATIVE NEGATIVE Final    Comment: (NOTE) The Xpert Xpress SARS-CoV-2/FLU/RSV plus assay is intended as an aid in the diagnosis of influenza from Nasopharyngeal swab specimens and should not be used as a sole basis for treatment. Nasal washings and aspirates are unacceptable for Xpert Xpress SARS-CoV-2/FLU/RSV testing.  Fact Sheet for Patients: BloggerCourse.com  Fact Sheet for Healthcare Providers: SeriousBroker.it  This test is not yet approved or cleared by the [place] FDA and has been authorized for detection and/or diagnosis of SARS-CoV-2 by FDA under an Emergency Use Authorization (EUA). This EUA will remain in effect (meaning this test can be used) for the duration of the COVID-19 declaration under Section 564(b)(1) of the Act, 21 U.S.C. section 360bbb-3(b)(1), unless the authorization is terminated or revoked.  Performed at [facility], [address], [place], [place] [address]   Surgical pcr screen     Status: None   Collection Time: 09/12/20  4:51 PM   Specimen: Nasal Mucosa; Nasal Swab  Result Value Ref Range Status   MRSA, PCR NEGATIVE NEGATIVE Final   Staphylococcus aureus NEGATIVE NEGATIVE Final    Comment: (NOTE) The Xpert SA Assay (FDA approved for NASAL specimens in patients 75 years of age and older), is one component of a comprehensive surveillance program. It is not intended to diagnose infection nor to guide or monitor treatment. Performed at [facility], [address] [place], [place] [address]     Time coordinating discharge: 38 mins   SIGNED:  Standley Dakins, MD  [facility] 09/15/2020, 12:20 PM How to contact the [facility] Attending or Consulting provider 7A - 7P or  covering provider during after hours 7P -7A, for this patient?  Check the care team in [facility] and look for a) attending/consulting TRH provider listed and b) the [facility] team listed Log into [email or web address] and use [place]'s universal password to access. If you do not have the password, please contact the hospital operator. Locate the [facility] provider you are looking for under [facility] and page to a number that you can be directly reached. If you still have difficulty reaching the provider, please page the [facility] (Director on Call) for the Hospitalists listed on amion for assistance.

## 2020-09-16 DIAGNOSIS — S82892K Other fracture of left lower leg, subsequent encounter for closed fracture with nonunion: Secondary | ICD-10-CM

## 2020-09-16 DIAGNOSIS — J9601 Acute respiratory failure with hypoxia: Secondary | ICD-10-CM

## 2020-09-16 LAB — RESP PANEL BY RT-PCR (FLU A&B, COVID) ARPGX2
Influenza A by PCR: NEGATIVE
Influenza B by PCR: NEGATIVE
SARS Coronavirus 2 by RT PCR: NEGATIVE

## 2020-09-16 MED ORDER — HYDROCORTISONE 1 % EX CREA
TOPICAL_CREAM | Freq: Three times a day (TID) | CUTANEOUS | Status: DC
  Filled 2020-09-16: qty 28

## 2020-09-16 NOTE — TOC Progression Note (Addendum)
Transition of Care [facility]) - Progression Note    Patient Details  Name: Angel Norman MRN: [id number] Date of Birth: Oct 27, 1932  [facility] CM/SW Contact  Barry Brunner, LCSW Phone Number: 09/16/2020, 1:56 PM  Clinical Narrative:    It was reported that patient's daughter toured [facility] facility before transport and refused admission to facility. Patient's daughter reported that she did not believe facility would be able to meet patient's needs. CSW contacted patient's daughter to discuss discharge planning. Patient's daughter reported that she felt that she did not have enough time to review facility prior to admission. Patient's daughter requested [facility] and reported that she had spoken to [employer] RN who provided patient's information to admission to be reviewed Monday. CSW also placed referral for patient. Patient's daughter reported that she is currently caring for her brother with significant ID and has also recently had to provide care for her mother due to decline. Patient expressed preference in facility close to her residence so that she is able to have access to her father and provide care for other family members. Patient not agreeable to [facility]. CSW not able to reach [facility]. CSW inquired about agreeableness to [facility] referral patient agreeable as a second option. MD reported that D/C home w/ HH would not be a safe discharge plan.CSW placed referral to [facility] with [facility]. Patient is fully vaccinated for Covid 19. Auth facility will need to be changed. TOC to follow.   Expected Discharge Plan: Skilled Nursing Facility Barriers to Discharge: Barriers Resolved  Expected Discharge Plan and Services Expected Discharge Plan: Skilled Nursing Facility     Post Acute Care Choice: Skilled Nursing Facility Living arrangements for the past 2 months: Single Family Home Expected Discharge Date: 09/15/20                                      Social Determinants of Health (SDOH) Interventions    Readmission Risk Interventions No flowsheet data found.

## 2020-09-16 NOTE — Progress Notes (Signed)
Subjective: 3 Days Post-Op Procedure(s) (LRB): OPEN REDUCTION INTERNAL FIXATION (ORIF) LEFT ANKLE FRACTURE (Left) Patient reports pain as mild.    Objective: Vital signs in last 24 hours: Temp:  [98 F (36.7 C)-99.1 F (37.3 C)] 98 F (36.7 C) (07/16 0532) Pulse Rate:  [87-99] 87 (07/16 0532) Resp:  [18] 18 (07/16 0532) BP: (121-133)/(69-74) 121/69 (07/16 0532) SpO2:  [82 %-93 %] 82 % (07/16 0813)  Intake/Output from previous day: 07/15 0701 - 07/16 0700 In: 1028 [P.O.:1028] Out: 670 [Urine:670] Intake/Output this shift: No intake/output data recorded.  Recent Labs    09/13/20 1706 09/14/20 0550 09/15/20 0544  HGB 11.5* 10.9* 10.7*   Recent Labs    09/14/20 0550 09/15/20 0544  WBC 9.9 7.9  RBC 4.07* 4.11*  HCT 37.1* 37.2*  PLT 170 184   Recent Labs    09/14/20 0550 09/15/20 0544  NA 136 134*  K 4.2 4.0  CL 104 99  CO2 25 24  BUN 15 15  CREATININE 1.17 1.14  GLUCOSE 121* 109*  CALCIUM 7.7* 7.9*   No results for input(s): LABPT, INR in the last 72 hours.  Neurologically intact   Assessment/Plan: 3 Days Post-Op Procedure(s) (LRB): OPEN REDUCTION INTERNAL FIXATION (ORIF) LEFT ANKLE FRACTURE (Left) Discharge to SNF      [facility] 09/16/2020, 10:02 AM

## 2020-09-16 NOTE — Progress Notes (Signed)
PROGRESS NOTE  Candace CruiseDavid F [facility] GNF:[id number] DOB: 06/13/1932 DOA: 09/11/2020 PCP: Lupita RaiderShaw, Kimberlee, MD  Brief History:   Beatriz ChancellorDavid F Burbano is a 87 y.o. 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  He denied any paresthesias or focal muscle weakness.  No head injuries or other injuries.  He had nausea and vomiting earlier.  He had headache in the ER without dizziness or blurred vision.  He denied presyncope or syncope.  No chest pain or palpitations.  No cough or wheezing or dyspnea.  No dysuria, oliguria or hematuria or flank pain.  ED Course: When he came to the ER his vital signs were within normal except approximately 84% on room air that was up to 93% on 5 L O2 by nasal cannula then 96% on 3 L.  Labs revealed blood glucose of 152, a creatinine 1.46 and BUN of 17 compared to 1 on 05/09/2018.  CBC showed cytosis of 12.12 and mild anemia and neutrophilia.  Influenza antigens and COVID-19 PCR came back negative.   EKG as reviewed by me : Showed sinus rhythm with a rate of 81 with PACs and T wave inversion laterally and slightly widened QRS Imaging: Portable chest ray showed bibasilar atelectasis with no acute cardiopulmonary disease.  Left ankle x-ray showed distal fibular fracture with lateral subluxation/dislocation of the talus with respect to the distal tibia.  The patient was given 50 mcg of IV fentanyl and 2 mg IV morphine sulfate, 40 mg of IV propofol and 500 mill IV normal saline as well as Tdap and 40 mg of IV Protonix.  Dr. Romeo AppleHarrison was notified and is aware about the patient.  He will be admitted to a medical-surgical bed for further evaluation and management.    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 - Encouraged regular incentive spirometry with known atelectasis and immobility.  - Checked echocardiogram with calcific CAD on CT, no priors available, trial lasix with resolved crackles on today's exam.  -stable on 2-3 L - Wean oxygen as tolerated.  Echocardiogram 09/14/20: 1. Left ventricular ejection fraction, by estimation, is 55 to 60%. The left ventricle has normal function. The left ventricle has no regional wall motion abnormalities. There is moderate left ventricular hypertrophy. Left ventricular diastolic parameters are consistent with Grade I diastolic dysfunction (impaired  relaxation).  2. Right ventricular systolic function is normal. The right ventricular size is normal. Tricuspid regurgitation signal is inadequate for assessing PA pressure.   3. Left atrial size was mildly dilated.   4. The mitral valve is normal in structure. No evidence of mitral valve regurgitation. No evidence of mitral stenosis.   5. The aortic valve is tricuspid. There is mild calcification of the aortic valve. There is mild thickening of the aortic valve. Aortic valve regurgitation is not visualized. No aortic stenosis is present.    Postop s/p left ankle fracture repair - NWB to LLL for 12 weeks - Follow up with Dr. Romeo AppleHarrison in 2 weeks for suture removal and repeat xrays. -s/p ORIF 09/13/20 - see above postop care instructions from Dr. Romeo AppleHarrison.    AKI on CKD  3a - RESOLVED  - Hold NSAID and monitor -baseline creatinine 1.0-1.3   T2DM: - resume home metformin, continue jardiance. Blood sugars at inpatient goal.    CAD: Noted by CT. - Outpatient follow up and risk optimization. - no chest pain presently   Postop anemia - Hg stable, recheck in 1-2 weeks   HLD: - Continue statin   GERD: - Continue PPI   BPH: - Continue flomax, monitor UOP   Obesity: Estimated  body mass index is 39.05 kg/m as calculated from the following:   Height as of this encounter: 6\' 3"  (1.905 m).   Weight as of this encounter: 141.7 kg.      Status is: Inpatient  Remains inpatient appropriate because:Inpatient level of care appropriate due to severity of illness  Dispo: The patient is from: Home              Anticipated d/c is to: SNF              Patient currently is medically stable   Difficult to place patient No  Barrier to d/c--unsafe d/c home; daughter seeking different SNF for placement        Family Communication:   Daughter updated 7/16  Consultants:  ortho  Code Status:  FULL   DVT Prophylaxis:  [place] Lovenox   Procedures: As Listed in Progress Note Above  Antibiotics: None      Subjective: Patient denies fevers, chills, headache, chest pain, dyspnea, nausea, vomiting, diarrhea, abdominal pain, dysuria, hematuria, hematochezia, and melena.   Objective: Vitals:   09/15/20 1953 09/15/20 2123 09/16/20 0532 09/16/20 0813  BP:  122/72 121/69   Pulse:  99 87   Resp:  18 18   Temp:  99.1 F (37.3 C) 98 F (36.7 C)   TempSrc:  Oral Oral   SpO2: 93% 91% 90% (!) 82%  Weight:      Height:        Intake/Output Summary (Last 24 hours) at 09/16/2020 1213 Last data filed at 09/16/2020 1038 Gross per 24 hour  Intake 796 ml  Output 1120 ml  Net -324 ml   Weight change:  Exam:  General:  Pt is alert, follows commands appropriately, not in acute distress HEENT: No icterus, No thrush, No neck mass, [place]/AT Cardiovascular: RRR, S1/S2, no rubs, no gallops Respiratory: scattered rales; no wheeze Abdomen: Soft/+BS, non tender, non distended, no guarding Extremities: trace LE edema, No lymphangitis, No petechiae, No rashes, no synovitis   Data Reviewed: I have personally reviewed following labs and imaging studies Basic Metabolic Panel: Recent Labs  Lab 09/12/20 0002 09/12/20 0510 09/13/20 0501 09/13/20 1706 09/14/20 0550  09/15/20 0544  NA 139 141 135  --  136 134*  K 4.2 4.1 4.2  --  4.2 4.0  CL 104 104 103  --  104 99  CO2 27 26 26   --  25 24  GLUCOSE 152* 138* 116*  --  121* 109*  BUN 17 16 15   --  15 15  CREATININE 1.46* 1.22 1.29* 1.14 1.17 1.14  CALCIUM 8.7* 8.8* 7.5*  --  7.7* 7.9*  PHOS  --   --  2.8  --   --   --    Liver Function Tests: Recent Labs  Lab 09/13/20 0501 09/14/20 0550  AST  --  31  ALT  --  16  ALKPHOS  --  47  BILITOT  --  0.8  PROT  --  6.6  ALBUMIN 3.4* 3.3*  No results for input(s): LIPASE, AMYLASE in the last 168 hours. No results for input(s): AMMONIA in the last 168 hours. Coagulation Profile: Recent Labs  Lab 09/12/20 0002  INR 1.1   CBC: Recent Labs  Lab 09/12/20 0002 09/12/20 0510 09/13/20 0501 09/13/20 1706 09/14/20 0550 09/15/20 0544  WBC 12.2* 10.6* 7.9 9.8 9.9 7.9  NEUTROABS 10.8*  --   --   --   --   --   HGB 12.5* 11.8* 11.2* 11.5* 10.9* 10.7*  HCT 41.5 39.4 38.4* 39.5 37.1* 37.2*  MCV 88.1 88.3 91.0 90.4 91.2 90.5  PLT 226 213 212 196 170 184   Cardiac Enzymes: No results for input(s): CKTOTAL, CKMB, CKMBINDEX, TROPONINI in the last 168 hours. BNP: Invalid input(s): POCBNP CBG: Recent Labs  Lab 09/11/20 2234 09/13/20 1246  GLUCAP 177* 87   HbA1C: No results for input(s): HGBA1C in the last 72 hours. Urine analysis:    Component Value Date/Time   COLORURINE YELLOW 05/09/2018 0937   APPEARANCEUR CLEAR 05/09/2018 0937   LABSPEC 1.020 05/09/2018 0937   PHURINE 6.0 05/09/2018 0937   GLUCOSEU NEGATIVE 05/09/2018 0937   HGBUR NEGATIVE 05/09/2018 0937   BILIRUBINUR NEGATIVE 05/09/2018 0937   KETONESUR NEGATIVE 05/09/2018 0937   PROTEINUR NEGATIVE 05/09/2018 0937   NITRITE NEGATIVE 05/09/2018 0937   LEUKOCYTESUR NEGATIVE 05/09/2018 0937   Sepsis Labs: (procalcitonin:4,lacticidven:4) ) Recent Results (from the past 240 hour(s))  Resp Panel by RT-PCR (Flu A&B, Covid) Nasopharyngeal Swab     Status: None    Collection Time: 09/12/20 12:18 AM   Specimen: Nasopharyngeal Swab; Nasopharyngeal(NP) swabs in vial transport medium  Result Value Ref Range Status   SARS Coronavirus 2 by RT PCR NEGATIVE NEGATIVE Final    Comment: (NOTE) SARS-CoV-2 target nucleic acids are NOT DETECTED.  The SARS-CoV-2 RNA is generally detectable in upper respiratory specimens during the acute phase of infection. The lowest concentration of SARS-CoV-2 viral copies this assay can detect is 138 copies/mL. A negative result does not preclude SARS-Cov-2 infection and should not be used as the sole basis for treatment or other patient management decisions. A negative result may occur with  improper specimen collection/handling, submission of specimen other than nasopharyngeal swab, presence of viral mutation(s) within the areas targeted by this assay, and inadequate number of viral copies(<138 copies/mL). A negative result must be combined with clinical observations, patient history, and epidemiological information. The expected result is Negative.  Fact Sheet for Patients:  BloggerCourse.com  Fact Sheet for Healthcare Providers:  SeriousBroker.it  This test is no t yet approved or cleared by the [place] FDA and  has been authorized for detection and/or diagnosis of SARS-CoV-2 by FDA under an Emergency Use Authorization (EUA). This EUA will remain  in effect (meaning this test can be used) for the duration of the COVID-19 declaration under Section 564(b)(1) of the Act, 21 U.S.C.section 360bbb-3(b)(1), unless the authorization is terminated  or revoked sooner.       Influenza A by PCR NEGATIVE NEGATIVE Final   Influenza B by PCR NEGATIVE NEGATIVE Final    Comment: (NOTE) The Xpert Xpress SARS-CoV-2/FLU/RSV plus assay is intended as an aid in the diagnosis of influenza from Nasopharyngeal swab specimens and should not be used as a sole basis for treatment.  Nasal washings and aspirates are unacceptable for Xpert Xpress SARS-CoV-2/FLU/RSV testing.  Fact Sheet for Patients: BloggerCourse.com  Fact Sheet for Healthcare Providers: SeriousBroker.it  This test is not yet approved or cleared by the [place] FDA  and has been authorized for detection and/or diagnosis of SARS-CoV-2 by FDA under an Emergency Use Authorization (EUA). This EUA will remain in effect (meaning this test can be used) for the duration of the COVID-19 declaration under Section 564(b)(1) of the Act, 21 U.S.C. section 360bbb-3(b)(1), unless the authorization is terminated or revoked.  Performed at [facility], [address], [place], [place] [address]   Surgical pcr screen     Status: None   Collection Time: 09/12/20  4:51 PM   Specimen: Nasal Mucosa; Nasal Swab  Result Value Ref Range Status   MRSA, PCR NEGATIVE NEGATIVE Final   Staphylococcus aureus NEGATIVE NEGATIVE Final    Comment: (NOTE) The Xpert SA Assay (FDA approved for NASAL specimens in patients 85 years of age and older), is one component of a comprehensive surveillance program. It is not intended to diagnose infection nor to guide or monitor treatment. Performed at [facility], [address], [place], [place] [address]      Scheduled Meds:  docusate sodium  100 mg Oral BID   empagliflozin  25 mg Oral q morning   enoxaparin (LOVENOX) injection  40 mg Subcutaneous Q24H   hydrocortisone cream   Topical TID   ipratropium-albuterol  3 mL Nebulization BID   metFORMIN  1,000 mg Oral BID WC   pantoprazole  40 mg Oral BID   polyethylene glycol  17 g Oral Daily   pravastatin  80 mg Oral q1800   senna-docusate  1 tablet Oral BID   tamsulosin  0.4 mg Oral Daily   traMADol  50 mg Oral Q6H   Continuous Infusions:  methocarbamol (ROBAXIN) IV      Procedures/Studies: DG Ankle 2 Views Left  Result Date: 09/13/2020 CLINICAL DATA:  Left ankle ORIF.  EXAM: DG C-ARM 1-60 MIN; LEFT ANKLE - 2 VIEW COMPARISON:  Radiographs 09/12/2020. FINDINGS: C-arm fluoroscopy was provided in the operating room. 46 seconds of fluoroscopy time. 7 spot fluoroscopic images are submitted. These demonstrate open reduction and internal fixation of the distal fibular fracture using a lateral plate and screws. One of the screws protrudes through the distal syndesmosis. There is an anchor screw within the medial malleolus. There is near anatomic reduction of the fractures, and no residual widening of the ankle mortise or talar subluxation. No complications identified. IMPRESSION: Intraoperative views during left ankle ORIF. No demonstrated complications. Electronically Signed   By: Carey Bullocks M.D.   On: 09/13/2020 16:51   DG Ankle Complete Left  Result Date: 09/11/2020 CLINICAL DATA:  Trip and fall with left ankle pain, initial encounter EXAM: LEFT ANKLE COMPLETE - 3+ VIEW COMPARISON:  None. FINDINGS: Oblique fracture through the distal fibular metaphysis is noted. Lateral displacement of the talus with respect to the tibia is noted measuring at least 2.3 cm. The talus is mildly posteriorly displaced as well. No definitive distal tibial fracture is seen. Mild tarsal degenerative changes are noted. IMPRESSION: Distal fibular fracture with lateral subluxation/dislocation of the talus with respect to the distal tibia. Electronically Signed   By: Alcide Clever M.D.   On: 09/11/2020 23:38   CT Angio Chest Pulmonary Embolism (PE) W or WO Contrast  Result Date: 09/12/2020 CLINICAL DATA:  Elevated D-dimer. EXAM: CT ANGIOGRAPHY CHEST WITH CONTRAST TECHNIQUE: Multidetector CT imaging of the chest was performed using the standard protocol during bolus administration of intravenous contrast. Multiplanar CT image reconstructions and MIPs were obtained to evaluate the vascular anatomy. CONTRAST:  100 mL OMNIPAQUE IOHEXOL 350 MG/ML SOLN COMPARISON:  Single-view of the chest 09/12/2020. CT  chest, abdomen and pelvis 12/06/2014. FINDINGS: Cardiovascular: Satisfactory opacification of the pulmonary arteries to the segmental level. No evidence of pulmonary embolism. Normal heart size. No pericardial effusion. Calcific aortic and coronary atherosclerosis noted. Mediastinum/Nodes: No enlarged mediastinal, hilar, or axillary lymph nodes. Thyroid gland, trachea, and esophagus demonstrate no significant findings. Lungs/Pleura: Extensive calcified pleural plaquing is again seen. Mild dependent atelectasis. No consolidative process, nodule or mass. No pleural effusion. Upper Abdomen: The visualized liver demonstrates fatty infiltration. Musculoskeletal: No acute bony abnormality. Remote healed left rib fractures noted. Review of the MIP images confirms the above findings. IMPRESSION: Negative for pulmonary embolus or acute disease. 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 Calcific coronary artery disease. Fatty infiltration of the liver. Aortic Atherosclerosis (ICD10-I70.0). Electronically Signed   By: Drusilla Kanner M.D.   On: 09/12/2020 19:38   DG Chest Port 1 View  Result Date: 09/12/2020 CLINICAL DATA:  Shortness of breath history of left ankle fracture EXAM: PORTABLE CHEST 1 VIEW COMPARISON:  12/06/2014 FINDINGS: Cardiac shadow is mildly enlarged but accentuated by the portable technique. Aortic calcifications are noted. Bibasilar atelectatic changes are seen. No confluent infiltrate or effusion is noted. No bony abnormality is seen. IMPRESSION: Bibasilar atelectatic changes as described. Electronically Signed   By: Alcide Clever M.D.   On: 09/12/2020 00:21   DG Ankle Left Port  Result Date: 09/12/2020 CLINICAL DATA:  Status post reduction EXAM: PORTABLE LEFT ANKLE - 2 VIEW COMPARISON:  Films from the previous day. FINDINGS: There has been some reduction with improved positioning of the talus although there remains significant lateral displacement. Distal fibular  fracture is again seen. A few small bony densities are noted consistent with avulsions in the tibiotalar space, not well visualized on prior exam. IMPRESSION: Interval reduction although persistent lateral displacement of the talus with respect to the distal tibia is seen. Electronically Signed   By: Alcide Clever M.D.   On: 09/12/2020 01:44   DG C-Arm 1-60 Min  Result Date: 09/13/2020 CLINICAL DATA:  Left ankle ORIF. EXAM: DG C-ARM 1-60 MIN; LEFT ANKLE - 2 VIEW COMPARISON:  Radiographs 09/12/2020. FINDINGS: C-arm fluoroscopy was provided in the operating room. 46 seconds of fluoroscopy time. 7 spot fluoroscopic images are submitted. These demonstrate open reduction and internal fixation of the distal fibular fracture using a lateral plate and screws. One of the screws protrudes through the distal syndesmosis. There is an anchor screw within the medial malleolus. There is near anatomic reduction of the fractures, and no residual widening of the ankle mortise or talar subluxation. No complications identified. IMPRESSION: Intraoperative views during left ankle ORIF. No demonstrated complications. Electronically Signed   By: Carey Bullocks M.D.   On: 09/13/2020 16:51   ECHOCARDIOGRAM COMPLETE  Result Date: 09/14/2020    ECHOCARDIOGRAM REPORT   Patient Name:   HARINDER ROMAS Date of Exam: 09/14/2020 Medical Rec #:  [id number]    Height:       75.0 in Accession #:    [id number]   Weight:       312.4 lb Date of Birth:  [birth year]    BSA:          2.652 m Patient Age:    87 years     BP:           135/71 mmHg Patient Gender: M            HR:  96 bpm. [facility] Procedure: 2D Echo, Cardiac Doppler and Color Doppler Indications:    CAD Native Vessel  History:        Patient has no prior history of Echocardiogram examinations.                 CAD, Arrythmias:LBBB; Risk Factors:Diabetes. Acute respiratory                 distress.  Sonographer:    Mikki Harbor Referring Phys: [phone] [place] Nine  IMPRESSIONS  1. Left ventricular ejection fraction, by estimation, is 55 to 60%. The left ventricle has normal function. The left ventricle has no regional wall motion abnormalities. There is moderate left ventricular hypertrophy. Left ventricular diastolic parameters are consistent with Grade I diastolic dysfunction (impaired relaxation).  2. Right ventricular systolic function is normal. The right ventricular size is normal. Tricuspid regurgitation signal is inadequate for assessing PA pressure.  3. Left atrial size was mildly dilated.  4. The mitral valve is normal in structure. No evidence of mitral valve regurgitation. No evidence of mitral stenosis.  5. The aortic valve is tricuspid. There is mild calcification of the aortic valve. There is mild thickening of the aortic valve. Aortic valve regurgitation is not visualized. No aortic stenosis is present. FINDINGS  Left Ventricle: Left ventricular ejection fraction, by estimation, is 55 to 60%. The left ventricle has normal function. The left ventricle has no regional wall motion abnormalities. Definity contrast agent was given IV to delineate the left ventricular  endocardial borders. The left ventricular internal cavity size was normal in size. There is moderate left ventricular hypertrophy. Left ventricular diastolic parameters are consistent with Grade I diastolic dysfunction (impaired relaxation). Normal left  ventricular filling pressure. Right Ventricle: The right ventricular size is normal. No increase in right ventricular wall thickness. Right ventricular systolic function is normal. Tricuspid regurgitation signal is inadequate for assessing PA pressure. Left Atrium: Left atrial size was mildly dilated. Right Atrium: Right atrial size was normal in size. Pericardium: There is no evidence of pericardial effusion. Mitral Valve: The mitral valve is normal in structure. No evidence of mitral valve regurgitation. No evidence of mitral valve stenosis. MV peak  gradient, 4.6 mmHg. The mean mitral valve gradient is 2.0 mmHg. Tricuspid Valve: The tricuspid valve is not well visualized. Tricuspid valve regurgitation is not demonstrated. No evidence of tricuspid stenosis. Aortic Valve: The aortic valve is tricuspid. There is mild calcification of the aortic valve. There is mild thickening of the aortic valve. There is mild aortic valve annular calcification. Aortic valve regurgitation is not visualized. No aortic stenosis  is present. Aortic valve mean gradient measures 5.0 mmHg. Aortic valve peak gradient measures 8.6 mmHg. Aortic valve area, by VTI measures 2.81 cm. Pulmonic Valve: The pulmonic valve was not well visualized. Pulmonic valve regurgitation is not visualized. No evidence of pulmonic stenosis. Aorta: The aortic root is normal in size and structure. Venous: The inferior vena cava was not well visualized. IAS/Shunts: The interatrial septum was not well visualized.  LEFT VENTRICLE PLAX 2D LVIDd:         4.51 cm  Diastology LVIDs:         3.12 cm  LV e' medial:    6.48 cm/s LV PW:         1.30 cm  LV E/e' medial:  12.5 LV IVS:        1.42 cm  LV e' lateral:   9.20 cm/s LVOT diam:  2.00 cm  LV E/e' lateral: 8.8 LV SV:         86 LV SV Index:   32 LVOT Area:     3.14 cm  RIGHT VENTRICLE TAPSE (M-mode): 2.1 cm LEFT ATRIUM             Index       RIGHT ATRIUM           Index LA diam:        4.30 cm 1.62 cm/m  RA Area:     17.00 cm LA Vol (A2C):   62.8 ml 23.68 ml/m RA Volume:   46.30 ml  17.46 ml/m LA Vol (A4C):   91.6 ml 34.54 ml/m LA Biplane Vol: 83.3 ml 31.41 ml/m  AORTIC VALVE AV Area (Vmax):    2.52 cm AV Area (Vmean):   2.54 cm AV Area (VTI):     2.81 cm AV Vmax:           147.00 cm/s AV Vmean:          108.000 cm/s AV VTI:            0.305 m AV Peak Grad:      8.6 mmHg AV Mean Grad:      5.0 mmHg LVOT Vmax:         118.00 cm/s LVOT Vmean:        87.400 cm/s LVOT VTI:          0.273 m LVOT/AV VTI ratio: 0.90  AORTA Ao Root diam: 3.60 cm Ao Asc diam:   3.60 cm MITRAL VALVE MV Area (PHT): 6.27 cm    SHUNTS MV Area VTI:   4.02 cm    Systemic VTI:  0.27 m MV Peak grad:  4.6 mmHg    Systemic Diam: 2.00 cm MV Mean grad:  2.0 mmHg MV Vmax:       1.07 m/s MV Vmean:      65.4 cm/s MV Decel Time: 121 msec MV E velocity: 81.00 cm/s MV A velocity: 96.00 cm/s MV E/A ratio:  0.84 Dina Rich MD Electronically signed by Dina Rich MD Signature Date/Time: 09/14/2020/4:09:16 PM    Final     Catarina Hartshorn, DO  [facility]  If 7PM-7AM, please contact night-coverage [email or web address] Password [email or web address] 09/16/2020, 12:13 PM   LOS: 4 days

## 2020-09-17 NOTE — Progress Notes (Signed)
PROGRESS NOTE  Angel Norman [facility] JXB:[id number] DOB: 11-27-1932 DOA: 09/11/2020 PCP: Lupita Raider, MD Brief History:   Angel Norman is a 87 y.o. 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  He denied any paresthesias or focal muscle weakness.  No head injuries or other injuries.  He had nausea and vomiting earlier.  He had headache in the ER without dizziness or blurred vision.  He denied presyncope or syncope.  No chest pain or palpitations.  No cough or wheezing or dyspnea.  No dysuria, oliguria or hematuria or flank pain.  ED Course: When he came to the ER his vital signs were within normal except approximately 84% on room air that was up to 93% on 5 L O2 by nasal cannula then 96% on 3 L.  Labs revealed blood glucose of 152, a creatinine 1.46 and BUN of 17 compared to 1 on 05/09/2018.  CBC showed cytosis of 12.12 and mild anemia and neutrophilia.  Influenza antigens and COVID-19 PCR came back negative.   EKG as reviewed by me : Showed sinus rhythm with a rate of 81 with PACs and T wave inversion laterally and slightly widened QRS Imaging: Portable chest ray showed bibasilar atelectasis with no acute cardiopulmonary disease.  Left ankle x-ray showed distal fibular fracture with lateral subluxation/dislocation of the talus with respect to the distal tibia.  The patient was given 50 mcg of IV fentanyl and 2 mg IV morphine sulfate, 40 mg of IV propofol and 500 mill IV normal saline as well as Tdap and 40 mg of IV Protonix.  Dr. Romeo Apple was notified and is aware about the patient.  He will be admitted to a medical-surgical bed for further evaluation and management.     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 - Encouraged regular incentive spirometry with known atelectasis and immobility.  - Checked echocardiogram with calcific CAD on CT, no priors available, trial lasix with resolved crackles on today's exam.  -stable on 2-3 L - Wean oxygen as tolerated.  Echocardiogram 09/14/20: 1. Left ventricular ejection fraction, by estimation, is 55 to 60%. The left ventricle has normal function. The left ventricle has no regional wall motion abnormalities. There is moderate left ventricular hypertrophy. Left ventricular diastolic parameters are consistent with Grade I diastolic dysfunction (impaired  relaxation).  2. Right ventricular systolic function is normal. The right ventricular size is normal. Tricuspid regurgitation signal is inadequate for assessing PA pressure.   3. Left atrial size was mildly dilated.   4. The mitral valve is normal in structure. No evidence of mitral valve regurgitation. No evidence of mitral stenosis.   5. The aortic valve is tricuspid. There is mild calcification of the aortic valve. There is mild thickening of the aortic valve. Aortic valve regurgitation is not visualized. No aortic stenosis is present.    Postop s/p left ankle fracture repair - NWB to LLL for 12 weeks - Follow up with Dr. Romeo Apple in 2 weeks for suture removal and repeat xrays. -s/p ORIF 09/13/20 - see above postop care instructions from Dr. Romeo Apple.    AKI on  CKD 3a - RESOLVED  - Hold NSAID and monitor -baseline creatinine 1.0-1.3   T2DM: - resume home metformin, continue jardiance. Blood sugars at inpatient goal.    CAD: Noted by CT. - Outpatient follow up and risk optimization. - no chest pain presently   Postop anemia - Hg stable, recheck in 1-2 weeks   HLD: - Continue statin   GERD: - Continue PPI   BPH: - Continue flomax, monitor UOP   Obesity: Estimated  body mass index is 39.05 kg/m as calculated from the following:   Height as of this encounter:  (1.905 m).   Weight as of this encounter: 141.7 kg.         Status is: Inpatient   Remains inpatient appropriate because:Inpatient level of care appropriate due to severity of illness   Dispo: The patient is from: Home              Anticipated d/c is to: SNF              Patient currently is medically stable              Difficult to place patient No             Barrier to d/c--unsafe d/c home; daughter seeking different SNF for placement               Family Communication:   Daughter updated 7/17   Consultants:  ortho   Code Status:  FULL   DVT Prophylaxis:  [place] Lovenox     Procedures: As Listed in Progress Note Above   Antibiotics: None     Subjective: Patient denies fevers, chills, headache, chest pain, dyspnea, nausea, vomiting, diarrhea, abdominal pain, dysuria  Objective: Vitals:   09/16/20 2227 09/17/20 0442 09/17/20 0809 09/17/20 1406  BP: (!) 152/76 127/64  116/69  Pulse: 91 92  87  Resp: Temp: (!) 97.5 F (36.4 C) 98.1 F (36.7 C)  98.6 F (37 C)  TempSrc: Oral Oral  Oral  SpO2: 94% 95% 93% 92%  Weight:      Height:        Intake/Output Summary (Last 24 hours) at 09/17/2020 1557 Last data filed at 09/17/2020 1300 Gross per 24 hour  Intake 1560 ml  Output 1925 ml  Net -365 ml   Weight change:  Exam:  General:  Pt is alert, follows commands appropriately, not in acute distress HEENT: No icterus, No thrush, No neck mass, [place]/AT Cardiovascular: RRR, S1/S2, no rubs, no gallops Respiratory: bibasilar rales. No wheeze Abdomen: Soft/+BS, non tender, non distended, no guarding Extremities: No edema, No lymphangitis, No petechiae, No rashes, no synovitis;  left leg wrapped   Data Reviewed: I have personally reviewed following labs and imaging studies Basic Metabolic Panel: Recent Labs  Lab 09/12/20 0002 09/12/20 0510  09/13/20 0501 09/13/20 1706 09/14/20 0550 09/15/20 0544  NA 139 141 135  --  136 134*  K 4.2 4.1 4.2  --  4.2 4.0  CL 104 104 103  --  104 99  CO2 --  25 24  GLUCOSE 152* 138* 116*  --  121* 109*  BUN --  15 15  CREATININE 1.46* 1.22 1.29* 1.14 1.17 1.14  CALCIUM 8.7* 8.8* 7.5*  --  7.7* 7.9*  PHOS  --   --  2.8  --   --   --    Liver Function Tests: Recent  Labs  Lab 09/13/20 0501 09/14/20 0550  AST  --  31  ALT  --  16  ALKPHOS  --  47  BILITOT  --  0.8  PROT  --  6.6  ALBUMIN 3.4* 3.3*   No results for input(s): LIPASE, AMYLASE in the last 168 hours. No results for input(s): AMMONIA in the last 168 hours. Coagulation Profile: Recent Labs  Lab 09/12/20 0002  INR 1.1   CBC: Recent Labs  Lab 09/12/20 0002 09/12/20 0510 09/13/20 0501 09/13/20 1706 09/14/20 0550 09/15/20 0544  WBC 12.2* 10.6* 7.9 9.8 9.9 7.9  NEUTROABS 10.8*  --   --   --   --   --   HGB 12.5* 11.8* 11.2* 11.5* 10.9* 10.7*  HCT 41.5 39.4 38.4* 39.5 37.1* 37.2*  MCV 88.1 88.3 91.0 90.4 91.2 90.5  PLT 226 213 212 196 170 184   Cardiac Enzymes: No results for input(s): CKTOTAL, CKMB, CKMBINDEX, TROPONINI in the last 168 hours. BNP: Invalid input(s): POCBNP CBG: Recent Labs  Lab 09/11/20 2234 09/13/20 1246  GLUCAP 177* 87   HbA1C: No results for input(s): HGBA1C in the last 72 hours. Urine analysis:    Component Value Date/Time   COLORURINE YELLOW 05/09/2018 0937   APPEARANCEUR CLEAR 05/09/2018 0937   LABSPEC 1.020 05/09/2018 0937   PHURINE 6.0 05/09/2018 0937   GLUCOSEU NEGATIVE 05/09/2018 0937   HGBUR NEGATIVE 05/09/2018 0937   BILIRUBINUR NEGATIVE 05/09/2018 0937   KETONESUR NEGATIVE 05/09/2018 0937   PROTEINUR NEGATIVE 05/09/2018 0937   NITRITE NEGATIVE 05/09/2018 0937   LEUKOCYTESUR NEGATIVE 05/09/2018 0937   Sepsis Labs: @LABRCNTIP (procalcitonin:4,lacticidven:4) ) Recent Results (from the past 240 hour(s))  Resp Panel by RT-PCR (Flu A&B, Covid)  Nasopharyngeal Swab     Status: None   Collection Time: 09/12/20 12:18 AM   Specimen: Nasopharyngeal Swab; Nasopharyngeal(NP) swabs in vial transport medium  Result Value Ref Range Status   SARS Coronavirus 2 by RT PCR NEGATIVE NEGATIVE Final    Comment: (NOTE) SARS-CoV-2 target nucleic acids are NOT DETECTED.  The SARS-CoV-2 RNA is generally detectable in upper respiratory specimens during the acute phase of infection. The lowest concentration of SARS-CoV-2 viral copies this assay can detect is 138 copies/mL. A negative result does not preclude SARS-Cov-2 infection and should not be used as the sole basis for treatment or other patient management decisions. A negative result may occur with  improper specimen collection/handling, submission of specimen other than nasopharyngeal swab, presence of viral mutation(s) within the areas targeted by this assay, and inadequate number of viral copies(<138 copies/mL). A negative result must be combined with clinical observations, patient history, and epidemiological information. The expected result is Negative.  Fact Sheet for Patients:  11/13/20  Fact Sheet for Healthcare Providers:  BloggerCourse.com  This test is no t yet approved or cleared by the [place] FDA and  has been authorized for detection and/or diagnosis of SARS-CoV-2 by FDA under an Emergency Use Authorization (EUA). This EUA will remain  in effect (meaning this test can be used) for the duration of the COVID-19 declaration under Section 564(b)(1) of the Act, 21 U.S.C.section 360bbb-3(b)(1), unless the authorization is terminated  or revoked sooner.       Influenza A by PCR NEGATIVE NEGATIVE Final   Influenza B by PCR NEGATIVE NEGATIVE Final    Comment: (NOTE) The Xpert Xpress SARS-CoV-2/FLU/RSV plus assay is intended as an aid in the diagnosis of influenza from Nasopharyngeal swab specimens and should not be  used as  a sole basis for treatment. Nasal washings and aspirates are unacceptable for Xpert Xpress SARS-CoV-2/FLU/RSV testing.  Fact Sheet for Patients: BloggerCourse.com  Fact Sheet for Healthcare Providers: SeriousBroker.it  This test is not yet approved or cleared by the [place] FDA and has been authorized for detection and/or diagnosis of SARS-CoV-2 by FDA under an Emergency Use Authorization (EUA). This EUA will remain in effect (meaning this test can be used) for the duration of the COVID-19 declaration under Section 564(b)(1) of the Act, 21 U.S.C. section 360bbb-3(b)(1), unless the authorization is terminated or revoked.  Performed at [facility], [address], [place], [place] [address]   Surgical pcr screen     Status: None   Collection Time: 09/12/20  4:51 PM   Specimen: Nasal Mucosa; Nasal Swab  Result Value Ref Range Status   MRSA, PCR NEGATIVE NEGATIVE Final   Staphylococcus aureus NEGATIVE NEGATIVE Final    Comment: (NOTE) The Xpert SA Assay (FDA approved for NASAL specimens in patients 36 years of age and older), is one component of a comprehensive surveillance program. It is not intended to diagnose infection nor to guide or monitor treatment. Performed at [facility], [address], [place], [place] [address]   Resp Panel by RT-PCR (Flu A&B, Covid) Nasopharyngeal Swab     Status: None   Collection Time: 09/16/20 11:59 AM   Specimen: Nasopharyngeal Swab; Nasopharyngeal(NP) swabs in vial transport medium  Result Value Ref Range Status   SARS Coronavirus 2 by RT PCR NEGATIVE NEGATIVE Final    Comment: (NOTE) SARS-CoV-2 target nucleic acids are NOT DETECTED.  The SARS-CoV-2 RNA is generally detectable in upper respiratory specimens during the acute phase of infection. The lowest concentration of SARS-CoV-2 viral copies this assay can detect is 138 copies/mL. A negative result does not preclude  SARS-Cov-2 infection and should not be used as the sole basis for treatment or other patient management decisions. A negative result may occur with  improper specimen collection/handling, submission of specimen other than nasopharyngeal swab, presence of viral mutation(s) within the areas targeted by this assay, and inadequate number of viral copies(<138 copies/mL). A negative result must be combined with clinical observations, patient history, and epidemiological information. The expected result is Negative.  Fact Sheet for Patients:  BloggerCourse.com  Fact Sheet for Healthcare Providers:  [employer]ious[employer]ker[employer]it  This test is no t yet approved or cleared by the [place] FDA and  has been authorized for detection and/or diagnosis of SARS-CoV-2 by FDA under an Emergency Use Authorization (EUA). This EUA will remain  in effect (meaning this test can be used) for the duration of the COVID-19 declaration under Section 564(b)(1) of the Act, 21 U.S.C.section 360bbb-3(b)(1), unless the authorization is terminated  or revoked sooner.       Influenza A by PCR NEGATIVE NEGATIVE Final   Influenza B by PCR NEGATIVE NEGATIVE Final    Comment: (NOTE) The Xpert Xpress SARS-CoV-2/FLU/RSV plus assay is intended as an aid in the diagnosis of influenza from Nasopharyngeal swab specimens and should not be used as a sole basis for treatment. Nasal washings and aspirates are unacceptable for Xpert Xpress SARS-CoV-2/FLU/RSV testing.  Fact Sheet for Patients: BloggerCourse.com  Fact Sheet for Healthcare Providers: [employer]ious[employer]ker.it  This test is not yet approved or cleared by the [place] FDA and has been authorized for detection and/or diagnosis of SARS-CoV-2 by FDA under an Emergency Use Authorization (EUA). This EUA will remain in effect (meaning this test can be used) for the duration of  the COVID-19 declaration under Section 564(b)(1) of the Act, 21 U.S.C. section 360bbb-3(b)(1), unless the authorization is terminated or revoked.  Performed at [facility], [address] [place], [place] [address]      Scheduled Meds:  docusate sodium  100 mg Oral BID   empagliflozin  25 mg Oral q morning   enoxaparin (LOVENOX) injection  40 mg Subcutaneous Q24H   hydrocortisone cream   Topical TID   ipratropium-albuterol  3 mL Nebulization BID   metFORMIN  1,000 mg Oral BID WC   pantoprazole  40 mg Oral BID   polyethylene glycol  17 g Oral Daily   pravastatin  80 mg Oral q1800   senna-docusate  1 tablet Oral BID   tamsulosin  0.4 mg Oral Daily   traMADol  50 mg Oral Q6H   Continuous Infusions:  methocarbamol (ROBAXIN) IV      Procedures/Studies: DG Ankle 2 Views Left  Result Date: 09/13/2020 CLINICAL DATA:  Left ankle ORIF. EXAM: DG C-ARM 1-60 MIN; LEFT ANKLE - 2 VIEW COMPARISON:  Radiographs 09/12/2020. FINDINGS: C-arm fluoroscopy was provided in the operating room. 46 seconds of fluoroscopy time. 7 spot fluoroscopic images are submitted. These demonstrate open reduction and internal fixation of the distal fibular fracture using a lateral plate and screws. One of the screws protrudes through the distal syndesmosis. There is an anchor screw within the medial malleolus. There is near anatomic reduction of the fractures, and no residual widening of the ankle mortise or talar subluxation. No complications identified. IMPRESSION: Intraoperative views during left ankle ORIF. No demonstrated complications. Electronically Signed   By: Carey Bullocks M.D.   On: 09/13/2020 16:51   DG Ankle Complete Left  Result Date: 09/11/2020 CLINICAL DATA:  Trip and fall with left ankle pain, initial encounter EXAM: LEFT ANKLE COMPLETE - 3+ VIEW COMPARISON:  None. FINDINGS: Oblique fracture through the distal fibular metaphysis is noted. Lateral displacement of the talus with respect to the tibia is  noted measuring at least 2.3 cm. The talus is mildly posteriorly displaced as well. No definitive distal tibial fracture is seen. Mild tarsal degenerative changes are noted. IMPRESSION: Distal fibular fracture with lateral subluxation/dislocation of the talus with respect to the distal tibia. Electronically Signed   By: Alcide Clever M.D.   On: 09/11/2020 23:38   CT Angio Chest Pulmonary Embolism (PE) W or WO Contrast  Result Date: 09/12/2020 CLINICAL DATA:  Elevated D-dimer. EXAM: CT ANGIOGRAPHY CHEST WITH CONTRAST TECHNIQUE: Multidetector CT imaging of the chest was performed using the standard protocol during bolus administration of intravenous contrast. Multiplanar CT image reconstructions and MIPs were obtained to evaluate the vascular anatomy. CONTRAST:  100 mL OMNIPAQUE IOHEXOL 350 MG/ML SOLN COMPARISON:  Single-view of the chest 09/12/2020. CT chest, abdomen and pelvis 12/06/2014. FINDINGS: Cardiovascular: Satisfactory opacification of the pulmonary arteries to the segmental level. No evidence of pulmonary embolism. Normal heart size. No pericardial effusion. Calcific aortic and coronary atherosclerosis noted. Mediastinum/Nodes: No enlarged mediastinal, hilar, or axillary lymph nodes. Thyroid gland, trachea, and esophagus demonstrate no significant findings. Lungs/Pleura: Extensive calcified pleural plaquing is again seen. Mild dependent atelectasis. No consolidative process, nodule or mass. No pleural effusion. Upper Abdomen: The visualized liver demonstrates fatty infiltration. Musculoskeletal: No acute bony abnormality. Remote healed left rib fractures noted. Review of the MIP images confirms the above findings. IMPRESSION: Negative for pulmonary embolus or acute disease. 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 Calcific coronary artery disease. Fatty infiltration of the liver. Aortic Atherosclerosis (  ICD10-I70.0). Electronically Signed   By: Drusilla Kanner M.D.    On: 09/12/2020 19:38   DG Chest Port 1 View  Result Date: 09/12/2020 CLINICAL DATA:  Shortness of breath history of left ankle fracture EXAM: PORTABLE CHEST 1 VIEW COMPARISON:  12/06/2014 FINDINGS: Cardiac shadow is mildly enlarged but accentuated by the portable technique. Aortic calcifications are noted. Bibasilar atelectatic changes are seen. No confluent infiltrate or effusion is noted. No bony abnormality is seen. IMPRESSION: Bibasilar atelectatic changes as described. Electronically Signed   By: Alcide Clever M.D.   On: 09/12/2020 00:21   DG Ankle Left Port  Result Date: 09/12/2020 CLINICAL DATA:  Status post reduction EXAM: PORTABLE LEFT ANKLE - 2 VIEW COMPARISON:  Films from the previous day. FINDINGS: There has been some reduction with improved positioning of the talus although there remains significant lateral displacement. Distal fibular fracture is again seen. A few small bony densities are noted consistent with avulsions in the tibiotalar space, not well visualized on prior exam. IMPRESSION: Interval reduction although persistent lateral displacement of the talus with respect to the distal tibia is seen. Electronically Signed   By: Alcide Clever M.D.   On: 09/12/2020 01:44   DG C-Arm 1-60 Min  Result Date: 09/13/2020 CLINICAL DATA:  Left ankle ORIF. EXAM: DG C-ARM 1-60 MIN; LEFT ANKLE - 2 VIEW COMPARISON:  Radiographs 09/12/2020. FINDINGS: C-arm fluoroscopy was provided in the operating room. 46 seconds of fluoroscopy time. 7 spot fluoroscopic images are submitted. These demonstrate open reduction and internal fixation of the distal fibular fracture using a lateral plate and screws. One of the screws protrudes through the distal syndesmosis. There is an anchor screw within the medial malleolus. There is near anatomic reduction of the fractures, and no residual widening of the ankle mortise or talar subluxation. No complications identified. IMPRESSION: Intraoperative views during left ankle  ORIF. No demonstrated complications. Electronically Signed   By: Carey Bullocks M.D.   On: 09/13/2020 16:51   ECHOCARDIOGRAM COMPLETE  Result Date: 09/14/2020    ECHOCARDIOGRAM REPORT   Patient Name:   EDDISON SEARLS Date of Exam: 09/14/2020 Medical Rec #:  [id number]    Height:       75.0 in Accession #:    [id number]   Weight:       312.4 lb Date of Birth:  12-02-1932    BSA:          2.652 m Patient Age:    87 years     BP:           135/71 mmHg Patient Gender: M            HR:           96 bpm. [facility] Procedure: 2D Echo, Cardiac Doppler and Color Doppler Indications:    CAD Native Vessel  History:        Patient has no prior history of Echocardiogram examinations.                 CAD, Arrythmias:LBBB; Risk Factors:Diabetes. Acute respiratory                 distress.  Sonographer:    Mikki Harbor Referring Phys: [id number] [place] IMPRESSIONS  1. Left ventricular ejection fraction, by estimation, is 55 to 60%. The left ventricle has normal function. The left ventricle has no regional wall motion abnormalities. There is moderate left ventricular hypertrophy. Left ventricular diastolic parameters are consistent with Grade  I diastolic dysfunction (impaired relaxation).  2. Right ventricular systolic function is normal. The right ventricular size is normal. Tricuspid regurgitation signal is inadequate for assessing PA pressure.  3. Left atrial size was mildly dilated.  4. The mitral valve is normal in structure. No evidence of mitral valve regurgitation. No evidence of mitral stenosis.  5. The aortic valve is tricuspid. There is mild calcification of the aortic valve. There is mild thickening of the aortic valve. Aortic valve regurgitation is not visualized. No aortic stenosis is present. FINDINGS  Left Ventricle: Left ventricular ejection fraction, by estimation, is 55 to 60%. The left ventricle has normal function. The left ventricle has no regional wall motion abnormalities. Definity  contrast agent was given IV to delineate the left ventricular  endocardial borders. The left ventricular internal cavity size was normal in size. There is moderate left ventricular hypertrophy. Left ventricular diastolic parameters are consistent with Grade I diastolic dysfunction (impaired relaxation). Normal left  ventricular filling pressure. Right Ventricle: The right ventricular size is normal. No increase in right ventricular wall thickness. Right ventricular systolic function is normal. Tricuspid regurgitation signal is inadequate for assessing PA pressure. Left Atrium: Left atrial size was mildly dilated. Right Atrium: Right atrial size was normal in size. Pericardium: There is no evidence of pericardial effusion. Mitral Valve: The mitral valve is normal in structure. No evidence of mitral valve regurgitation. No evidence of mitral valve stenosis. MV peak gradient, 4.6 mmHg. The mean mitral valve gradient is 2.0 mmHg. Tricuspid Valve: The tricuspid valve is not well visualized. Tricuspid valve regurgitation is not demonstrated. No evidence of tricuspid stenosis. Aortic Valve: The aortic valve is tricuspid. There is mild calcification of the aortic valve. There is mild thickening of the aortic valve. There is mild aortic valve annular calcification. Aortic valve regurgitation is not visualized. No aortic stenosis  is present. Aortic valve mean gradient measures 5.0 mmHg. Aortic valve peak gradient measures 8.6 mmHg. Aortic valve area, by VTI measures 2.81 cm. Pulmonic Valve: The pulmonic valve was not well visualized. Pulmonic valve regurgitation is not visualized. No evidence of pulmonic stenosis. Aorta: The aortic root is normal in size and structure. Venous: The inferior vena cava was not well visualized. IAS/Shunts: The interatrial septum was not well visualized.  LEFT VENTRICLE PLAX 2D LVIDd:         4.51 cm  Diastology LVIDs:         3.12 cm  LV e' medial:    6.48 cm/s LV PW:         1.30 cm  LV E/e'  medial:  12.5 LV IVS:        1.42 cm  LV e' lateral:   9.20 cm/s LVOT diam:     2.00 cm  LV E/e' lateral: 8.8 LV SV:         86 LV SV Index:   32 LVOT Area:     3.14 cm  RIGHT VENTRICLE TAPSE (M-mode): 2.1 cm LEFT ATRIUM             Index       RIGHT ATRIUM           Index LA diam:        4.30 cm 1.62 cm/m  RA Area:     17.00 cm LA Vol (A2C):   62.8 ml 23.68 ml/m RA Volume:   46.30 ml  17.46 ml/m LA Vol (A4C):   91.6 ml 34.54 ml/m LA Biplane Vol: 83.3 ml 31.41 ml/m  AORTIC VALVE AV Area (Vmax):    2.52 cm AV Area (Vmean):   2.54 cm AV Area (VTI):     2.81 cm AV Vmax:           147.00 cm/s AV Vmean:          108.000 cm/s AV VTI:            0.305 m AV Peak Grad:      8.6 mmHg AV Mean Grad:      5.0 mmHg LVOT Vmax:         118.00 cm/s LVOT Vmean:        87.400 cm/s LVOT VTI:          0.273 m LVOT/AV VTI ratio: 0.90  AORTA Ao Root diam: 3.60 cm Ao Asc diam:  3.60 cm MITRAL VALVE MV Area (PHT): 6.27 cm    SHUNTS MV Area VTI:   4.02 cm    Systemic VTI:  0.27 m MV Peak grad:  4.6 mmHg    Systemic Diam: 2.00 cm MV Mean grad:  2.0 mmHg MV Vmax:       1.07 m/s MV Vmean:      65.4 cm/s MV Decel Time: 121 msec MV E velocity: 81.00 cm/s MV A velocity: 96.00 cm/s MV E/A ratio:  0.84 Dina Rich MD Electronically signed by Dina Rich MD Signature Date/Time: 09/14/2020/4:09:16 PM    Final     Catarina Hartshorn, DO  [facility]  If 7PM-7AM, please contact night-coverage [email or web address] Password [email or web address] 09/17/2020, 3:57 PM   LOS: 5 days

## 2020-09-17 NOTE — Progress Notes (Signed)
Subjective: 4 Days Post-Op Procedure(s) (LRB): OPEN REDUCTION INTERNAL FIXATION (ORIF) LEFT ANKLE FRACTURE (Left) Patient reports pain as mild.    Objective: Vital signs in last 24 hours: Temp:  [97.5 F (36.4 C)-98.6 F (37 C)] 98.4 F (36.9 C) (07/17 2102) Pulse Rate:  [80-92] 87 (07/17 2102) Resp:  [16-18] 18 (07/17 2102) BP: (116-152)/(55-76) 119/55 (07/17 2102) SpO2:  [91 %-96 %] 91 % (07/17 2102)  Intake/Output from previous day: 07/16 0701 - 07/17 0700 In: 1560 [P.O.:1560] Out: 2250 [Urine:2250] Intake/Output this shift: Total I/O In: -  Out: 400 [Urine:400]  Recent Labs    09/15/20 0544  HGB 10.7*   Recent Labs    09/15/20 0544  WBC 7.9  RBC 4.11*  HCT 37.2*  PLT 184   Recent Labs    09/15/20 0544  NA 134*  K 4.0  CL 99  CO2 24  BUN 15  CREATININE 1.14  GLUCOSE 109*  CALCIUM 7.9*   No results for input(s): LABPT, INR in the last 72 hours.  Sensation intact distally Compartment soft   Assessment/Plan: 4 Days Post-Op Procedure(s) (LRB): OPEN REDUCTION INTERNAL FIXATION (ORIF) LEFT ANKLE FRACTURE (Left) Advance diet Up with therapy D/C IV fluids Discharge to SNF, when bed available       Fuller Canada 09/17/2020, 10:15 PM

## 2020-09-18 DIAGNOSIS — J989 Respiratory disorder, unspecified: Secondary | ICD-10-CM | POA: Diagnosis not present

## 2020-09-18 DIAGNOSIS — N390 Urinary tract infection, site not specified: Secondary | ICD-10-CM | POA: Diagnosis not present

## 2020-09-18 DIAGNOSIS — E669 Obesity, unspecified: Secondary | ICD-10-CM | POA: Diagnosis not present

## 2020-09-18 DIAGNOSIS — E119 Type 2 diabetes mellitus without complications: Secondary | ICD-10-CM | POA: Diagnosis not present

## 2020-09-18 DIAGNOSIS — Z7401 Bed confinement status: Secondary | ICD-10-CM | POA: Diagnosis not present

## 2020-09-18 DIAGNOSIS — M6281 Muscle weakness (generalized): Secondary | ICD-10-CM | POA: Diagnosis not present

## 2020-09-18 DIAGNOSIS — N1831 Chronic kidney disease, stage 3a: Secondary | ICD-10-CM | POA: Diagnosis not present

## 2020-09-18 DIAGNOSIS — R279 Unspecified lack of coordination: Secondary | ICD-10-CM | POA: Diagnosis not present

## 2020-09-18 DIAGNOSIS — Z23 Encounter for immunization: Secondary | ICD-10-CM | POA: Diagnosis present

## 2020-09-18 DIAGNOSIS — R262 Difficulty in walking, not elsewhere classified: Secondary | ICD-10-CM | POA: Diagnosis not present

## 2020-09-18 DIAGNOSIS — M1990 Unspecified osteoarthritis, unspecified site: Secondary | ICD-10-CM | POA: Diagnosis not present

## 2020-09-18 DIAGNOSIS — S82892D Other fracture of left lower leg, subsequent encounter for closed fracture with routine healing: Secondary | ICD-10-CM | POA: Diagnosis not present

## 2020-09-18 DIAGNOSIS — N183 Chronic kidney disease, stage 3 unspecified: Secondary | ICD-10-CM

## 2020-09-18 DIAGNOSIS — J9601 Acute respiratory failure with hypoxia: Secondary | ICD-10-CM | POA: Diagnosis not present

## 2020-09-18 DIAGNOSIS — K219 Gastro-esophageal reflux disease without esophagitis: Secondary | ICD-10-CM | POA: Diagnosis not present

## 2020-09-18 DIAGNOSIS — E785 Hyperlipidemia, unspecified: Secondary | ICD-10-CM | POA: Diagnosis not present

## 2020-09-18 DIAGNOSIS — S82899A Other fracture of unspecified lower leg, initial encounter for closed fracture: Secondary | ICD-10-CM | POA: Diagnosis not present

## 2020-09-18 DIAGNOSIS — N400 Enlarged prostate without lower urinary tract symptoms: Secondary | ICD-10-CM | POA: Diagnosis not present

## 2020-09-18 DIAGNOSIS — F4323 Adjustment disorder with mixed anxiety and depressed mood: Secondary | ICD-10-CM | POA: Diagnosis not present

## 2020-09-18 DIAGNOSIS — D649 Anemia, unspecified: Secondary | ICD-10-CM | POA: Diagnosis not present

## 2020-09-18 DIAGNOSIS — S82892K Other fracture of left lower leg, subsequent encounter for closed fracture with nonunion: Secondary | ICD-10-CM | POA: Diagnosis not present

## 2020-09-18 DIAGNOSIS — R0902 Hypoxemia: Secondary | ICD-10-CM | POA: Diagnosis not present

## 2020-09-18 DIAGNOSIS — K5900 Constipation, unspecified: Secondary | ICD-10-CM | POA: Diagnosis not present

## 2020-09-18 DIAGNOSIS — Y93K9 Activity, other involving animal care: Secondary | ICD-10-CM | POA: Diagnosis not present

## 2020-09-18 DIAGNOSIS — N179 Acute kidney failure, unspecified: Secondary | ICD-10-CM | POA: Diagnosis not present

## 2020-09-18 DIAGNOSIS — I2510 Atherosclerotic heart disease of native coronary artery without angina pectoris: Secondary | ICD-10-CM | POA: Diagnosis not present

## 2020-09-18 DIAGNOSIS — W010XXA Fall on same level from slipping, tripping and stumbling without subsequent striking against object, initial encounter: Secondary | ICD-10-CM | POA: Diagnosis not present

## 2020-09-18 DIAGNOSIS — E1122 Type 2 diabetes mellitus with diabetic chronic kidney disease: Secondary | ICD-10-CM | POA: Diagnosis not present

## 2020-09-18 MED ORDER — TRAMADOL HCL 50 MG PO TABS: 50.0000 mg | ORAL_TABLET | Freq: Four times a day (QID) | ORAL | 0 refills | Status: AC | PRN

## 2020-09-18 NOTE — TOC Transition Note (Signed)
Transition of Care [facility]) - CM/SW Discharge Note   Patient Details  Name: Angel Norman MRN: [id number] Date of Birth: 1932-12-23  [facility] CM/SW Contact:  Karn Cassis, LCSW Phone Number: 09/18/2020, 2:53 PM   Clinical Narrative:  Pt's daughter, Phyliss initially requested [facility], but they are unable to accept. She accepts bed at [facility] in [place]. RN given number to call report. Daughter requests transport via [facility]. Authorization completed per CMA. COVID negative 09/16/20. LCSW will send d/c summary when completed.      Final next level of care: Skilled Nursing Facility Barriers to Discharge: Barriers Resolved   Patient Goals and CMS Choice Patient states their goals for this hospitalization and ongoing recovery are:: rehab with SNF CMS Medicare.gov Compare Post Acute Care list provided to:: Patient Choice offered to / list presented to : Patient  Discharge Placement   Existing PASRR number confirmed : 09/18/20          Patient chooses bed at: [facility] Patient to be transferred to facility by: [facility] Name of family member notified: Phyliss- daughter Patient and family notified of of transfer: 09/18/20  Discharge Plan and Services     Post Acute Care Choice: Skilled Nursing Facility                               Social Determinants of Health (SDOH) Interventions     Readmission Risk Interventions No flowsheet data found.

## 2020-09-18 NOTE — Progress Notes (Signed)
Physical Therapy Treatment Patient Details Name: Angel Norman MRN: [id number] DOB: 1932-07-28 Today's Date: 09/18/2020    History of Present Illness Angel Norman is a 87 y.o. 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  He denied any paresthesias or focal muscle weakness.  No head injuries or other injuries.  He had nausea and vomiting earlier.  He had headache in the ER without dizziness or blurred vision.  He denied presyncope or syncope.  No chest pain or palpitations.  No cough or wheezing or dyspnea.  No dysuria, oliguria or hematuria or flank pain.    PT Comments    Patient demonstrates good return for moving LLE during bed mobility, requires repeated verbal cueing for proper hand placement during sit to stands and transferring to [facility] with fair carryover, tends to attempt pull self up with RW instead of pushing down.  Patient limited to a few unsteady labored side steps with fair/poor return for keeping left foot off floor and requested to go back to bed after having bowel movement in BSC.  Patient will benefit from continued physical therapy in hospital and recommended venue below to increase strength, balance, endurance for safe ADLs and gait.     Follow Up Recommendations  SNF     Equipment Recommendations  None recommended by PT    Recommendations for Other Services       Precautions / Restrictions Precautions Precautions: Fall Restrictions Weight Bearing Restrictions: Yes LLE Weight Bearing: Non weight bearing    Mobility  Bed Mobility Overal bed mobility: Needs Assistance Bed Mobility: Supine to Sit;Sit to Supine     Supine to sit: Modified independent (Device/Increase time);Supervision Sit to supine: Modified independent  (Device/Increase time);Supervision        Transfers Overall transfer level: Needs assistance Equipment used: Rolling walker (2 wheeled) Transfers: Sit to/from [employer] Sit to Stand: Min assist;Mod assist Stand pivot transfers: Min assist;Mod assist       General transfer comment: very unsteady with fair return for keeping left foot off floor  Ambulation/Gait Ambulation/Gait assistance: Mod assist Gait Distance (Feet): 4 Feet Assistive device: Rolling walker (2 wheeled) Gait Pattern/deviations: Decreased step length - right;Decreased stride length;Antalgic Gait velocity: slow   General Gait Details: fair/poor return for keeing left foot off floor when taking steps and limited to a few steps at bedside due to fatigue, fair/poor standing balance   Stairs             Wheelchair Mobility    Modified Rankin (Stroke Patients Only)       Balance Overall balance assessment: Needs assistance Sitting-balance support: Feet supported;No upper extremity supported Sitting balance-Leahy Scale: Good Sitting balance - Comments: at EOB   Standing balance support: During functional activity;Bilateral upper extremity supported Standing balance-Leahy Scale: Poor Standing balance comment: fair/poor using RW with NWB LLE                            Cognition Arousal/Alertness: Awake/alert Behavior During Therapy: WFL for tasks assessed/performed Overall Cognitive Status: Within Functional Limits for tasks assessed  Exercises General Exercises - Lower Extremity Long Arc Quad: Seated;AROM;Strengthening;Both;10 reps Hip Flexion/Marching: Seated;AROM;Strengthening;Both;10 reps Toe Raises: Seated;AROM;Strengthening;Right;10 reps Heel Raises: Seated;AROM;Strengthening;Right;10 reps    General Comments        Pertinent Vitals/Pain Pain Assessment: No/denies pain    Home Living                       Prior Function            PT Goals (current goals can now be found in the care plan section) Acute Rehab PT Goals Patient Stated Goal: [place] farm PT Goal Formulation: With patient Time For Goal Achievement: 09/29/20 Potential to Achieve Goals: Good Progress towards PT goals: Progressing toward goals    Frequency    Min 3X/week      PT Plan      Co-evaluation              AM-PAC PT "6 Clicks" Mobility   Outcome Measure  Help needed turning from your back to your side while in a flat bed without using bedrails?: None Help needed moving from lying on your back to sitting on the side of a flat bed without using bedrails?: A Little Help needed moving to and from a bed to a chair (including a wheelchair)?: A Little Help needed standing up from a chair using your arms (e.g., wheelchair or bedside chair)?: A Lot Help needed to walk in hospital room?: A Lot Help needed climbing 3-5 steps with a railing? : Total 6 Click Score: 15    End of Session   Activity Tolerance: Patient tolerated treatment well;Patient limited by fatigue Patient left: in bed;with call bell/phone within reach Nurse Communication: Mobility status PT Visit Diagnosis: Unsteadiness on feet (R26.81);Muscle weakness (generalized) (M62.81);Difficulty in walking, not elsewhere classified (R26.2)     Time: 1355-1430 PT Time Calculation (min) (ACUTE ONLY): 35 min  Charges:  $Therapeutic Exercise: 8-22 mins $Therapeutic Activity: 8-22 mins                    3:52 PM, 09/18/20 Ocie Bob, MPT Physical Therapist with [facility] [phone] office [phone] mobile phone

## 2020-09-18 NOTE — Progress Notes (Signed)
Report called, awaiting EMS

## 2020-09-18 NOTE — Care Management Important Message (Signed)
Important Message  Patient Details  Name: Angel Norman MRN: [id number] Date of Birth: 10-06-1932   Medicare Important Message Given:  Yes     Corey Harold 09/18/2020, 11:10 AM

## 2020-09-18 NOTE — Discharge Summary (Signed)
Physician Discharge Summary  [name] ZOX:[id number] DOB: [birth year] DOA: 09/11/2020  PCP: Lupita Raider, MD  Admit date: 09/11/2020 Discharge date: 09/18/2020  Admitted From: Home Disposition:  SNF  Recommendations for Outpatient Follow-up:  Follow up with PCP in 1-2 weeks Please obtain BMP/CBC in one week Maintain patient on 2L [place] and wean for saturation >92%     Discharge Condition: Stable CODE STATUS:FULL Diet recommendation: Heart Healthy / Carb Modified / Dysphagia / Regular   Brief/Interim Summary: 87 y.o. 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  He denied any paresthesias or focal muscle weakness.  No head injuries or other injuries.  He had nausea and vomiting earlier.  He had headache in the ER without dizziness or blurred vision.  He denied presyncope or syncope.  No chest pain or palpitations.  No cough or wheezing or dyspnea.  No dysuria, oliguria or hematuria or flank pain.  ED Course: When he came to the ER his vital signs were within normal except approximately 84% on room air that was up to 93% on 5 L O2 by nasal cannula then 96% on 3 L.  Labs revealed blood glucose of 152, a creatinine 1.46 and BUN of 17 compared to 1 on 05/09/2018.  CBC showed cytosis of 12.12 and mild anemia and neutrophilia.  Influenza antigens and COVID-19 PCR came back negative.   EKG as reviewed by me : Showed sinus rhythm with a rate of 81 with PACs and T wave inversion laterally and slightly widened QRS Imaging: Portable chest ray showed bibasilar atelectasis with no acute cardiopulmonary disease.  Left ankle x-ray showed distal fibular fracture with lateral subluxation/dislocation of the talus with respect to the distal tibia.  The patient was given  50 mcg of IV fentanyl and 2 mg IV morphine sulfate, 40 mg of IV propofol and 500 mill IV normal saline as well as Tdap and 40 mg of IV Protonix.  Dr. Romeo Apple was notified and is aware about the patient.  He will be admitted to a medical-surgical bed for further evaluation and management.  He underwent ORIF of his left ankle on 09/13/20.  His post-operative course was largely unremarkable except for pain and his new oxygen requirement which was felt to be due to the finding of pleural plaques.  Patient had hx of prior asbestos exposure.  CTA chest was negative for PE.  He remained stable on 2L.  PT evaluated patient and recommended SNF with which patient and daughter agreed.     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 - Encouraged regular incentive spirometry with known atelectasis and immobility.  - Checked echocardiogram with calcific CAD on CT, no priors available, trial lasix with resolved crackles on today's exam.  -stable on 2 L - Wean oxygen as tolerated.  Echocardiogram 09/14/20: 1. Left ventricular ejection fraction, by estimation, is 55 to 60%. The left ventricle has normal function. The left ventricle has no regional wall motion abnormalities. There is moderate left ventricular hypertrophy. Left ventricular diastolic parameters are consistent with Grade I diastolic dysfunction (impaired  relaxation).  2. Right ventricular systolic function is normal. The right ventricular size is  normal. Tricuspid regurgitation signal is inadequate for assessing PA pressure.   3. Left atrial size was mildly dilated.   4. The mitral valve is normal in structure. No evidence of mitral valve regurgitation. No evidence of mitral stenosis.   5. The aortic valve is tricuspid. There is mild calcification of the aortic valve. There  is mild thickening of the aortic valve. Aortic valve regurgitation is not visualized. No aortic stenosis is present.    Postop s/p left ankle fracture repair - NWB to LLL for 12 weeks - Follow up with Dr. Romeo AppleHarrison in 2 weeks for suture removal and repeat xrays. -s/p ORIF 09/13/20 - see above postop care instructions from Dr. Romeo AppleHarrison.    CKD 3a - RESOLVED  - Hold NSAID and monitor -baseline creatinine 1.0-1.3   T2DM: - resume home metformin, continue jardiance. Blood sugars at inpatient goal.  -CBGs well controlled during hospitalization   CAD: Noted by CT. - Outpatient follow up and risk optimization. - no chest pain presently   Postop anemia - Hg stable, recheck in 1 weeks   HLD: - Continue statin   GERD: - Continue PPI   BPH: - Continue flomax, monitor UOP   Obesity: Estimated body mass index is 39.05 kg/m as calculated from the following:   Height as of this encounter: 6\' 3"  (1.905 m).   Weight as of this encounter: 141.7 kg.    Discharge Instructions   Allergies as of 09/18/2020   No Known Allergies      Medication List     TAKE these medications    acetaminophen 325 MG tablet Commonly known as: TYLENOL Take 2 tablets (650 mg total) by mouth every 6 (six) hours.   aspirin EC 81 MG tablet Take 81 mg by mouth daily as needed for mild pain. Swallow whole.   ipratropium-albuterol 0.5-2.5 (3) MG/3ML Soln Commonly known as: DUONEB Take 3 mLs by nebulization every 4 (four) hours as needed (wheezing, cough, SOB).   Jardiance 25 MG Tabs tablet Generic drug: empagliflozin Take 25 mg by mouth every morning.   metFORMIN 1000 MG tablet Commonly known as: GLUCOPHAGE Take 1,000 mg by mouth 2 (two) times daily.   pantoprazole 40 MG tablet Commonly known as: Protonix Take 1 tablet (40 mg total) by mouth daily. Needs to use twice a day for 12 weeks for gastritis What changed: when to take this   polyethylene glycol 17 g packet Commonly known as: MIRALAX /  GLYCOLAX Take 17 g by mouth daily.   pravastatin 80 MG tablet Commonly known as: PRAVACHOL Take 80 mg by mouth daily.   tamsulosin 0.4 MG Caps capsule Commonly known as: FLOMAX Take 0.4 mg by mouth daily.   traMADol 50 MG tablet Commonly known as: ULTRAM Take 1 tablet (50 mg total) by mouth every 6 (six) hours as needed for up to 3 days for moderate pain.        Contact information for follow-up providers     Vickki HearingHarrison, Stanley E, MD. Schedule an appointment as soon as possible for a visit on 09/27/2020.   Specialties: Orthopedic Surgery, Radiology Why: For suture removal, For wound re-check, Hospital Follow Up Contact information: [address] [place] [address] [phone]         Lupita RaiderShaw, Kimberlee, MD. Schedule an appointment as soon as possible for a visit in 2 week(s).   Specialty: Family Medicine Why: Hospital Follow Up, If symptoms worsen Contact information: [address] [place] [address] [phone]  Contact information for after-discharge care     Destination     [facility] .   Service: Skilled Nursing Contact information: [address] [place] [address] [phone]                    No Known Allergies  Consultations: Jonita Albee   Procedures/Studies: DG Ankle 2 Views Left  Result Date: 09/13/2020 CLINICAL DATA:  Left ankle ORIF. EXAM: DG C-ARM 1-60 MIN; LEFT ANKLE - 2 VIEW COMPARISON:  Radiographs 09/12/2020. FINDINGS: C-arm fluoroscopy was provided in the operating room. 46 seconds of fluoroscopy time. 7 spot fluoroscopic images are submitted. These demonstrate open reduction and internal fixation of the distal fibular fracture using a lateral plate and screws. One of the screws protrudes through the distal syndesmosis. There is an anchor screw within the medial malleolus. There is near anatomic reduction of the  fractures, and no residual widening of the ankle mortise or talar subluxation. No complications identified. IMPRESSION: Intraoperative views during left ankle ORIF. No demonstrated complications. Electronically Signed   By: Carey Bullocks M.D.   On: 09/13/2020 16:51   DG Ankle Complete Left  Result Date: 09/11/2020 CLINICAL DATA:  Trip and fall with left ankle pain, initial encounter EXAM: LEFT ANKLE COMPLETE - 3+ VIEW COMPARISON:  None. FINDINGS: Oblique fracture through the distal fibular metaphysis is noted. Lateral displacement of the talus with respect to the tibia is noted measuring at least 2.3 cm. The talus is mildly posteriorly displaced as well. No definitive distal tibial fracture is seen. Mild tarsal degenerative changes are noted. IMPRESSION: Distal fibular fracture with lateral subluxation/dislocation of the talus with respect to the distal tibia. Electronically Signed   By: Alcide Clever M.D.   On: 09/11/2020 23:38   CT Angio Chest Pulmonary Embolism (PE) W or WO Contrast  Result Date: 09/12/2020 CLINICAL DATA:  Elevated D-dimer. EXAM: CT ANGIOGRAPHY CHEST WITH CONTRAST TECHNIQUE: Multidetector CT imaging of the chest was performed using the standard protocol during bolus administration of intravenous contrast. Multiplanar CT image reconstructions and MIPs were obtained to evaluate the vascular anatomy. CONTRAST:  100 mL OMNIPAQUE IOHEXOL 350 MG/ML SOLN COMPARISON:  Single-view of the chest 09/12/2020. CT chest, abdomen and pelvis 12/06/2014. FINDINGS: Cardiovascular: Satisfactory opacification of the pulmonary arteries to the segmental level. No evidence of pulmonary embolism. Normal heart size. No pericardial effusion. Calcific aortic and coronary atherosclerosis noted. Mediastinum/Nodes: No enlarged mediastinal, hilar, or axillary lymph nodes. Thyroid gland, trachea, and esophagus demonstrate no significant findings. Lungs/Pleura: Extensive calcified pleural plaquing is again seen. Mild  dependent atelectasis. No consolidative process, nodule or mass. No pleural effusion. Upper Abdomen: The visualized liver demonstrates fatty infiltration. Musculoskeletal: No acute bony abnormality. Remote healed left rib fractures noted. Review of the MIP images confirms the above findings. IMPRESSION: Negative for pulmonary embolus or acute disease. 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 Calcific coronary artery disease. Fatty infiltration of the liver. Aortic Atherosclerosis (ICD10-I70.0). Electronically Signed   By: Drusilla Kanner M.D.   On: 09/12/2020 19:38   DG Chest Port 1 View  Result Date: 09/12/2020 CLINICAL DATA:  Shortness of breath history of left ankle fracture EXAM: PORTABLE CHEST 1 VIEW COMPARISON:  12/06/2014 FINDINGS: Cardiac shadow is mildly enlarged but accentuated by the portable technique. Aortic calcifications are noted. Bibasilar atelectatic changes are seen. No confluent infiltrate or effusion is noted. No bony abnormality is seen. IMPRESSION: Bibasilar atelectatic changes as described. Electronically Signed   By:  Alcide Clever M.D.   On: 09/12/2020 00:21   DG Ankle Left Port  Result Date: 09/12/2020 CLINICAL DATA:  Status post reduction EXAM: PORTABLE LEFT ANKLE - 2 VIEW COMPARISON:  Films from the previous day. FINDINGS: There has been some reduction with improved positioning of the talus although there remains significant lateral displacement. Distal fibular fracture is again seen. A few small bony densities are noted consistent with avulsions in the tibiotalar space, not well visualized on prior exam. IMPRESSION: Interval reduction although persistent lateral displacement of the talus with respect to the distal tibia is seen. Electronically Signed   By: Alcide Clever M.D.   On: 09/12/2020 01:44   DG C-Arm 1-60 Min  Result Date: 09/13/2020 CLINICAL DATA:  Left ankle ORIF. EXAM: DG C-ARM 1-60 MIN; LEFT ANKLE - 2 VIEW COMPARISON:   Radiographs 09/12/2020. FINDINGS: C-arm fluoroscopy was provided in the operating room. 46 seconds of fluoroscopy time. 7 spot fluoroscopic images are submitted. These demonstrate open reduction and internal fixation of the distal fibular fracture using a lateral plate and screws. One of the screws protrudes through the distal syndesmosis. There is an anchor screw within the medial malleolus. There is near anatomic reduction of the fractures, and no residual widening of the ankle mortise or talar subluxation. No complications identified. IMPRESSION: Intraoperative views during left ankle ORIF. No demonstrated complications. Electronically Signed   By: Carey Bullocks M.D.   On: 09/13/2020 16:51   ECHOCARDIOGRAM COMPLETE  Result Date: 09/14/2020    ECHOCARDIOGRAM REPORT   Patient Name:   Angel Norman Date of Exam: 09/14/2020 Medical Rec #:  [id number]    Height:       75.0 in Accession #:    [id number]   Weight:       312.4 lb Date of Birth:  [birth year]    BSA:          2.652 m Patient Age:    87 years     BP:           135/71 mmHg Patient Gender: M            HR:           96 bpm. [facility] Procedure: 2D Echo, Cardiac Doppler and Color Doppler Indications:    CAD Native Vessel  History:        Patient has no prior history of Echocardiogram examinations.                 CAD, Arrythmias:LBBB; Risk Factors:Diabetes. Acute respiratory                 distress.  Sonographer:    Mikki Harbor Referring Phys: [phone] [place] Nine IMPRESSIONS  1. Left ventricular ejection fraction, by estimation, is 55 to 60%. The left ventricle has normal function. The left ventricle has no regional wall motion abnormalities. There is moderate left ventricular hypertrophy. Left ventricular diastolic parameters are consistent with Grade I diastolic dysfunction (impaired relaxation).  2. Right ventricular systolic function is normal. The right ventricular size is normal. Tricuspid regurgitation signal is inadequate for  assessing PA pressure.  3. Left atrial size was mildly dilated.  4. The mitral valve is normal in structure. No evidence of mitral valve regurgitation. No evidence of mitral stenosis.  5. The aortic valve is tricuspid. There is mild calcification of the aortic valve. There is mild thickening of the aortic valve. Aortic valve regurgitation is not visualized. No aortic stenosis is  present. FINDINGS  Left Ventricle: Left ventricular ejection fraction, by estimation, is 55 to 60%. The left ventricle has normal function. The left ventricle has no regional wall motion abnormalities. Definity contrast agent was given IV to delineate the left ventricular  endocardial borders. The left ventricular internal cavity size was normal in size. There is moderate left ventricular hypertrophy. Left ventricular diastolic parameters are consistent with Grade I diastolic dysfunction (impaired relaxation). Normal left  ventricular filling pressure. Right Ventricle: The right ventricular size is normal. No increase in right ventricular wall thickness. Right ventricular systolic function is normal. Tricuspid regurgitation signal is inadequate for assessing PA pressure. Left Atrium: Left atrial size was mildly dilated. Right Atrium: Right atrial size was normal in size. Pericardium: There is no evidence of pericardial effusion. Mitral Valve: The mitral valve is normal in structure. No evidence of mitral valve regurgitation. No evidence of mitral valve stenosis. MV peak gradient, 4.6 mmHg. The mean mitral valve gradient is 2.0 mmHg. Tricuspid Valve: The tricuspid valve is not well visualized. Tricuspid valve regurgitation is not demonstrated. No evidence of tricuspid stenosis. Aortic Valve: The aortic valve is tricuspid. There is mild calcification of the aortic valve. There is mild thickening of the aortic valve. There is mild aortic valve annular calcification. Aortic valve regurgitation is not visualized. No aortic stenosis  is present.  Aortic valve mean gradient measures 5.0 mmHg. Aortic valve peak gradient measures 8.6 mmHg. Aortic valve area, by VTI measures 2.81 cm. Pulmonic Valve: The pulmonic valve was not well visualized. Pulmonic valve regurgitation is not visualized. No evidence of pulmonic stenosis. Aorta: The aortic root is normal in size and structure. Venous: The inferior vena cava was not well visualized. IAS/Shunts: The interatrial septum was not well visualized.  LEFT VENTRICLE PLAX 2D LVIDd:         4.51 cm  Diastology LVIDs:         3.12 cm  LV e' medial:    6.48 cm/s LV PW:         1.30 cm  LV E/e' medial:  12.5 LV IVS:        1.42 cm  LV e' lateral:   9.20 cm/s LVOT diam:     2.00 cm  LV E/e' lateral: 8.8 LV SV:         86 LV SV Index:   32 LVOT Area:     3.14 cm  RIGHT VENTRICLE TAPSE (M-mode): 2.1 cm LEFT ATRIUM             Index       RIGHT ATRIUM           Index LA diam:        4.30 cm 1.62 cm/m  RA Area:     17.00 cm LA Vol (A2C):   62.8 ml 23.68 ml/m RA Volume:   46.30 ml  17.46 ml/m LA Vol (A4C):   91.6 ml 34.54 ml/m LA Biplane Vol: 83.3 ml 31.41 ml/m  AORTIC VALVE AV Area (Vmax):    2.52 cm AV Area (Vmean):   2.54 cm AV Area (VTI):     2.81 cm AV Vmax:           147.00 cm/s AV Vmean:          108.000 cm/s AV VTI:            0.305 m AV Peak Grad:      8.6 mmHg AV Mean Grad:      5.0 mmHg LVOT  Vmax:         118.00 cm/s LVOT Vmean:        87.400 cm/s LVOT VTI:          0.273 m LVOT/AV VTI ratio: 0.90  AORTA Ao Root diam: 3.60 cm Ao Asc diam:  3.60 cm MITRAL VALVE MV Area (PHT): 6.27 cm    SHUNTS MV Area VTI:   4.02 cm    Systemic VTI:  0.27 m MV Peak grad:  4.6 mmHg    Systemic Diam: 2.00 cm MV Mean grad:  2.0 mmHg MV Vmax:       1.07 m/s MV Vmean:      65.4 cm/s MV Decel Time: 121 msec MV E velocity: 81.00 cm/s MV A velocity: 96.00 cm/s MV E/A ratio:  0.84 Dina Rich MD Electronically signed by Dina Rich MD Signature Date/Time: 09/14/2020/4:09:16 PM    Final         Discharge Exam: Vitals:    09/18/20 0617 09/18/20 0758  BP: (!) 153/77   Pulse: 80   Resp: 18   Temp: 98.1 F (36.7 C)   SpO2: 98% (!) 83%   Vitals:   09/17/20 1953 09/17/20 2102 09/18/20 0617 09/18/20 0758  BP:  (!) 119/55 (!) 153/77   Pulse: 80 87 80   Resp: Temp:  98.4 F (36.9 C) 98.1 F (36.7 C)   TempSrc:  Oral Oral   SpO2: 96% 91% 98% (!) 83%  Weight:      Height:        General: Pt is alert, awake, not in acute distress Cardiovascular: RRR, S1/S2 +, no rubs, no gallops Respiratory: bibasilar rales. No wheeze Abdominal: Soft, NT, ND, bowel sounds + Extremities: LLE no edema, no cyanosis;  trace edema RLE   The results of significant diagnostics from this hospitalization (including imaging, microbiology, ancillary and laboratory) are listed below for reference.    Significant Diagnostic Studies: DG Ankle 2 Views Left  Result Date: 09/13/2020 CLINICAL DATA:  Left ankle ORIF. EXAM: DG C-ARM 1-60 MIN; LEFT ANKLE - 2 VIEW COMPARISON:  Radiographs 09/12/2020. FINDINGS: C-arm fluoroscopy was provided in the operating room. 46 seconds of fluoroscopy time. 7 spot fluoroscopic images are submitted. These demonstrate open reduction and internal fixation of the distal fibular fracture using a lateral plate and screws. One of the screws protrudes through the distal syndesmosis. There is an anchor screw within the medial malleolus. There is near anatomic reduction of the fractures, and no residual widening of the ankle mortise or talar subluxation. No complications identified. IMPRESSION: Intraoperative views during left ankle ORIF. No demonstrated complications. Electronically Signed   By: Carey Bullocks M.D.   On: 09/13/2020 16:51   DG Ankle Complete Left  Result Date: 09/11/2020 CLINICAL DATA:  Trip and fall with left ankle pain, initial encounter EXAM: LEFT ANKLE COMPLETE - 3+ VIEW COMPARISON:  None. FINDINGS: Oblique fracture through the distal fibular metaphysis is noted. Lateral displacement  of the talus with respect to the tibia is noted measuring at least 2.3 cm. The talus is mildly posteriorly displaced as well. No definitive distal tibial fracture is seen. Mild tarsal degenerative changes are noted. IMPRESSION: Distal fibular fracture with lateral subluxation/dislocation of the talus with respect to the distal tibia. Electronically Signed   By: Alcide Clever M.D.   On: 09/11/2020 23:38   CT Angio Chest Pulmonary Embolism (PE) W or WO Contrast  Result Date: 09/12/2020 CLINICAL DATA:  Elevated D-dimer. EXAM: CT  ANGIOGRAPHY CHEST WITH CONTRAST TECHNIQUE: Multidetector CT imaging of the chest was performed using the standard protocol during bolus administration of intravenous contrast. Multiplanar CT image reconstructions and MIPs were obtained to evaluate the vascular anatomy. CONTRAST:  100 mL OMNIPAQUE IOHEXOL 350 MG/ML SOLN COMPARISON:  Single-view of the chest 09/12/2020. CT chest, abdomen and pelvis 12/06/2014. FINDINGS: Cardiovascular: Satisfactory opacification of the pulmonary arteries to the segmental level. No evidence of pulmonary embolism. Normal heart size. No pericardial effusion. Calcific aortic and coronary atherosclerosis noted. Mediastinum/Nodes: No enlarged mediastinal, hilar, or axillary lymph nodes. Thyroid gland, trachea, and esophagus demonstrate no significant findings. Lungs/Pleura: Extensive calcified pleural plaquing is again seen. Mild dependent atelectasis. No consolidative process, nodule or mass. No pleural effusion. Upper Abdomen: The visualized liver demonstrates fatty infiltration. Musculoskeletal: No acute bony abnormality. Remote healed left rib fractures noted. Review of the MIP images confirms the above findings. IMPRESSION: Negative for pulmonary embolus or acute disease. 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 Calcific coronary artery disease. Fatty infiltration of the liver. Aortic Atherosclerosis (ICD10-I70.0).  Electronically Signed   By: Drusilla Kanner M.D.   On: 09/12/2020 19:38   DG Chest Port 1 View  Result Date: 09/12/2020 CLINICAL DATA:  Shortness of breath history of left ankle fracture EXAM: PORTABLE CHEST 1 VIEW COMPARISON:  12/06/2014 FINDINGS: Cardiac shadow is mildly enlarged but accentuated by the portable technique. Aortic calcifications are noted. Bibasilar atelectatic changes are seen. No confluent infiltrate or effusion is noted. No bony abnormality is seen. IMPRESSION: Bibasilar atelectatic changes as described. Electronically Signed   By: Alcide Clever M.D.   On: 09/12/2020 00:21   DG Ankle Left Port  Result Date: 09/12/2020 CLINICAL DATA:  Status post reduction EXAM: PORTABLE LEFT ANKLE - 2 VIEW COMPARISON:  Films from the previous day. FINDINGS: There has been some reduction with improved positioning of the talus although there remains significant lateral displacement. Distal fibular fracture is again seen. A few small bony densities are noted consistent with avulsions in the tibiotalar space, not well visualized on prior exam. IMPRESSION: Interval reduction although persistent lateral displacement of the talus with respect to the distal tibia is seen. Electronically Signed   By: Alcide Clever M.D.   On: 09/12/2020 01:44   DG C-Arm 1-60 Min  Result Date: 09/13/2020 CLINICAL DATA:  Left ankle ORIF. EXAM: DG C-ARM 1-60 MIN; LEFT ANKLE - 2 VIEW COMPARISON:  Radiographs 09/12/2020. FINDINGS: C-arm fluoroscopy was provided in the operating room. 46 seconds of fluoroscopy time. 7 spot fluoroscopic images are submitted. These demonstrate open reduction and internal fixation of the distal fibular fracture using a lateral plate and screws. One of the screws protrudes through the distal syndesmosis. There is an anchor screw within the medial malleolus. There is near anatomic reduction of the fractures, and no residual widening of the ankle mortise or talar subluxation. No complications identified.  IMPRESSION: Intraoperative views during left ankle ORIF. No demonstrated complications. Electronically Signed   By: Carey Bullocks M.D.   On: 09/13/2020 16:51   ECHOCARDIOGRAM COMPLETE  Result Date: 09/14/2020    ECHOCARDIOGRAM REPORT   Patient Name:   Angel Norman Date of Exam: 09/14/2020 Medical Rec #:  [id number]    Height:       75.0 in Accession #:    [id number]   Weight:       312.4 lb Date of Birth:  07/29/1932    BSA:          2.652 m Patient  Age:    87 years     BP:           135/71 mmHg Patient Gender: M            HR:           96 bpm. [facility] Procedure: 2D Echo, Cardiac Doppler and Color Doppler Indications:    CAD Native Vessel  History:        Patient has no prior history of Echocardiogram examinations.                 CAD, Arrythmias:LBBB; Risk Factors:Diabetes. Acute respiratory                 distress.  Sonographer:    Mikki Harbor Referring Phys: [phone] Tyrone Nine IMPRESSIONS  1. Left ventricular ejection fraction, by estimation, is 55 to 60%. The left ventricle has normal function. The left ventricle has no regional wall motion abnormalities. There is moderate left ventricular hypertrophy. Left ventricular diastolic parameters are consistent with Grade I diastolic dysfunction (impaired relaxation).  2. Right ventricular systolic function is normal. The right ventricular size is normal. Tricuspid regurgitation signal is inadequate for assessing PA pressure.  3. Left atrial size was mildly dilated.  4. The mitral valve is normal in structure. No evidence of mitral valve regurgitation. No evidence of mitral stenosis.  5. The aortic valve is tricuspid. There is mild calcification of the aortic valve. There is mild thickening of the aortic valve. Aortic valve regurgitation is not visualized. No aortic stenosis is present. FINDINGS  Left Ventricle: Left ventricular ejection fraction, by estimation, is 55 to 60%. The left ventricle has normal function. The left ventricle has no  regional wall motion abnormalities. Definity contrast agent was given IV to delineate the left ventricular  endocardial borders. The left ventricular internal cavity size was normal in size. There is moderate left ventricular hypertrophy. Left ventricular diastolic parameters are consistent with Grade I diastolic dysfunction (impaired relaxation). Normal left  ventricular filling pressure. Right Ventricle: The right ventricular size is normal. No increase in right ventricular wall thickness. Right ventricular systolic function is normal. Tricuspid regurgitation signal is inadequate for assessing PA pressure. Left Atrium: Left atrial size was mildly dilated. Right Atrium: Right atrial size was normal in size. Pericardium: There is no evidence of pericardial effusion. Mitral Valve: The mitral valve is normal in structure. No evidence of mitral valve regurgitation. No evidence of mitral valve stenosis. MV peak gradient, 4.6 mmHg. The mean mitral valve gradient is 2.0 mmHg. Tricuspid Valve: The tricuspid valve is not well visualized. Tricuspid valve regurgitation is not demonstrated. No evidence of tricuspid stenosis. Aortic Valve: The aortic valve is tricuspid. There is mild calcification of the aortic valve. There is mild thickening of the aortic valve. There is mild aortic valve annular calcification. Aortic valve regurgitation is not visualized. No aortic stenosis  is present. Aortic valve mean gradient measures 5.0 mmHg. Aortic valve peak gradient measures 8.6 mmHg. Aortic valve area, by VTI measures 2.81 cm. Pulmonic Valve: The pulmonic valve was not well visualized. Pulmonic valve regurgitation is not visualized. No evidence of pulmonic stenosis. Aorta: The aortic root is normal in size and structure. Venous: The inferior vena cava was not well visualized. IAS/Shunts: The interatrial septum was not well visualized.  LEFT VENTRICLE PLAX 2D LVIDd:         4.51 cm  Diastology LVIDs:         3.12 cm  LV e' medial:     6.48 cm/s LV PW:         1.30 cm  LV E/e' medial:  12.5 LV IVS:        1.42 cm  LV e' lateral:   9.20 cm/s LVOT diam:     2.00 cm  LV E/e' lateral: 8.8 LV SV:         86 LV SV Index:   32 LVOT Area:     3.14 cm  RIGHT VENTRICLE TAPSE (M-mode): 2.1 cm LEFT ATRIUM             Index       RIGHT ATRIUM           Index LA diam:        4.30 cm 1.62 cm/m  RA Area:     17.00 cm LA Vol (A2C):   62.8 ml 23.68 ml/m RA Volume:   46.30 ml  17.46 ml/m LA Vol (A4C):   91.6 ml 34.54 ml/m LA Biplane Vol: 83.3 ml 31.41 ml/m  AORTIC VALVE AV Area (Vmax):    2.52 cm AV Area (Vmean):   2.54 cm AV Area (VTI):     2.81 cm AV Vmax:           147.00 cm/s AV Vmean:          108.000 cm/s AV VTI:            0.305 m AV Peak Grad:      8.6 mmHg AV Mean Grad:      5.0 mmHg LVOT Vmax:         118.00 cm/s LVOT Vmean:        87.400 cm/s LVOT VTI:          0.273 m LVOT/AV VTI ratio: 0.90  AORTA Ao Root diam: 3.60 cm Ao Asc diam:  3.60 cm MITRAL VALVE MV Area (PHT): 6.27 cm    SHUNTS MV Area VTI:   4.02 cm    Systemic VTI:  0.27 m MV Peak grad:  4.6 mmHg    Systemic Diam: 2.00 cm MV Mean grad:  2.0 mmHg MV Vmax:       1.07 m/s MV Vmean:      65.4 cm/s MV Decel Time: 121 msec MV E velocity: 81.00 cm/s MV A velocity: 96.00 cm/s MV E/A ratio:  0.84 Dina Rich MD Electronically signed by Dina Rich MD Signature Date/Time: 09/14/2020/4:09:16 PM    Final     Microbiology: Recent Results (from the past 240 hour(s))  Resp Panel by RT-PCR (Flu A&B, Covid) Nasopharyngeal Swab     Status: None   Collection Time: 09/12/20 12:18 AM   Specimen: Nasopharyngeal Swab; Nasopharyngeal(NP) swabs in vial transport medium  Result Value Ref Range Status   SARS Coronavirus 2 by RT PCR NEGATIVE NEGATIVE Final    Comment: (NOTE) SARS-CoV-2 target nucleic acids are NOT DETECTED.  The SARS-CoV-2 RNA is generally detectable in upper respiratory specimens during the acute phase of infection. The lowest concentration of SARS-CoV-2 viral copies  this assay can detect is 138 copies/mL. A negative result does not preclude SARS-Cov-2 infection and should not be used as the sole basis for treatment or other patient management decisions. A negative result may occur with  improper specimen collection/handling, submission of specimen other than nasopharyngeal swab, presence of viral mutation(s) within the areas targeted by this assay, and inadequate number of viral copies(<138 copies/mL). A negative result must be combined with clinical observations, patient  history, and epidemiological information. The expected result is Negative.  Fact Sheet for Patients:  BloggerCourse.com  Fact Sheet for Healthcare Providers:  SeriousBroker.it  This test is no t yet approved or cleared by the [place] FDA and  has been authorized for detection and/or diagnosis of SARS-CoV-2 by FDA under an Emergency Use Authorization (EUA). This EUA will remain  in effect (meaning this test can be used) for the duration of the COVID-19 declaration under Section 564(b)(1) of the Act, 21 U.S.C.section 360bbb-3(b)(1), unless the authorization is terminated  or revoked sooner.       Influenza A by PCR NEGATIVE NEGATIVE Final   Influenza B by PCR NEGATIVE NEGATIVE Final    Comment: (NOTE) The Xpert Xpress SARS-CoV-2/FLU/RSV plus assay is intended as an aid in the diagnosis of influenza from Nasopharyngeal swab specimens and should not be used as a sole basis for treatment. Nasal washings and aspirates are unacceptable for Xpert Xpress SARS-CoV-2/FLU/RSV testing.  Fact Sheet for Patients: BloggerCourse.com  Fact Sheet for Healthcare Providers: SeriousBroker.it  This test is not yet approved or cleared by the [place] FDA and has been authorized for detection and/or diagnosis of SARS-CoV-2 by FDA under an Emergency Use Authorization (EUA). This EUA  will remain in effect (meaning this test can be used) for the duration of the COVID-19 declaration under Section 564(b)(1) of the Act, 21 U.S.C. section 360bbb-3(b)(1), unless the authorization is terminated or revoked.  Performed at [facility], [address], [place], [place] [address]   Surgical pcr screen     Status: None   Collection Time: 09/12/20  4:51 PM   Specimen: Nasal Mucosa; Nasal Swab  Result Value Ref Range Status   MRSA, PCR NEGATIVE NEGATIVE Final   Staphylococcus aureus NEGATIVE NEGATIVE Final    Comment: (NOTE) The Xpert SA Assay (FDA approved for NASAL specimens in patients 15 years of age and older), is one component of a comprehensive surveillance program. It is not intended to diagnose infection nor to guide or monitor treatment. Performed at [facility], [address], [place], [place] [address]   Resp Panel by RT-PCR (Flu A&B, Covid) Nasopharyngeal Swab     Status: None   Collection Time: 09/16/20 11:59 AM   Specimen: Nasopharyngeal Swab; Nasopharyngeal(NP) swabs in vial transport medium  Result Value Ref Range Status   SARS Coronavirus 2 by RT PCR NEGATIVE NEGATIVE Final    Comment: (NOTE) SARS-CoV-2 target nucleic acids are NOT DETECTED.  The SARS-CoV-2 RNA is generally detectable in upper respiratory specimens during the acute phase of infection. The lowest concentration of SARS-CoV-2 viral copies this assay can detect is 138 copies/mL. A negative result does not preclude SARS-Cov-2 infection and should not be used as the sole basis for treatment or other patient management decisions. A negative result may occur with  improper specimen collection/handling, submission of specimen other than nasopharyngeal swab, presence of viral mutation(s) within the areas targeted by this assay, and inadequate number of viral copies(<138 copies/mL). A negative result must be combined with clinical observations, patient history, and epidemiological information.  The expected result is Negative.  Fact Sheet for Patients:  BloggerCourse.com  Fact Sheet for Healthcare Providers:  SeriousBroker.it  This test is no t yet approved or cleared by the [place] FDA and  has been authorized for detection and/or diagnosis of SARS-CoV-2 by FDA under an Emergency Use Authorization (EUA). This EUA will remain  in effect (meaning this test can be used) for the duration of the  COVID-19 declaration under Section 564(b)(1) of the Act, 21 U.S.C.section 360bbb-3(b)(1), unless the authorization is terminated  or revoked sooner.       Influenza A by PCR NEGATIVE NEGATIVE Final   Influenza B by PCR NEGATIVE NEGATIVE Final    Comment: (NOTE) The Xpert Xpress SARS-CoV-2/FLU/RSV plus assay is intended as an aid in the diagnosis of influenza from Nasopharyngeal swab specimens and should not be used as a sole basis for treatment. Nasal washings and aspirates are unacceptable for Xpert Xpress SARS-CoV-2/FLU/RSV testing.  Fact Sheet for Patients: BloggerCourse.com  Fact Sheet for Healthcare Providers: SeriousBroker.it  This test is not yet approved or cleared by the [place] FDA and has been authorized for detection and/or diagnosis of SARS-CoV-2 by FDA under an Emergency Use Authorization (EUA). This EUA will remain in effect (meaning this test can be used) for the duration of the COVID-19 declaration under Section 564(b)(1) of the Act, 21 U.S.C. section 360bbb-3(b)(1), unless the authorization is terminated or revoked.  Performed at [facility], [address] [place], [place] [address]      Labs: Basic Metabolic Panel: Recent Labs  Lab 09/12/20 0002 09/12/20 0510 09/13/20 0501 09/13/20 1706 09/14/20 0550 09/15/20 0544  NA 139 141 135  --  136 134*  K 4.2 4.1 4.2  --  4.2 4.0  CL 104 104 103  --  104 99  CO2 --  25 24  GLUCOSE  152* 138* 116*  --  121* 109*  BUN --  15 15  CREATININE 1.46* 1.22 1.29* 1.14 1.17 1.14  CALCIUM 8.7* 8.8* 7.5*  --  7.7* 7.9*  PHOS  --   --  2.8  --   --   --    Liver Function Tests: Recent Labs  Lab 09/13/20 0501 09/14/20 0550  AST  --  31  ALT  --  16  ALKPHOS  --  47  BILITOT  --  0.8  PROT  --  6.6  ALBUMIN 3.4* 3.3*   No results for input(s): LIPASE, AMYLASE in the last 168 hours. No results for input(s): AMMONIA in the last 168 hours. CBC: Recent Labs  Lab 09/12/20 0002 09/12/20 0510 09/13/20 0501 09/13/20 1706 09/14/20 0550 09/15/20 0544  WBC 12.2* 10.6* 7.9 9.8 9.9 7.9  NEUTROABS 10.8*  --   --   --   --   --   HGB 12.5* 11.8* 11.2* 11.5* 10.9* 10.7*  HCT 41.5 39.4 38.4* 39.5 37.1* 37.2*  MCV 88.1 88.3 91.0 90.4 91.2 90.5  PLT 226 213 212 196 170 184   Cardiac Enzymes: No results for input(s): CKTOTAL, CKMB, CKMBINDEX, TROPONINI in the last 168 hours. BNP: Invalid input(s): POCBNP CBG: Recent Labs  Lab 09/11/20 2234 09/13/20 1246  GLUCAP 177* 87    Time coordinating discharge:  36 minutes  Signed:  Catarina Hartshorn, DO [facility] Pager: [phone] 09/18/2020, 2:54 PM

## 2020-09-19 DIAGNOSIS — E669 Obesity, unspecified: Secondary | ICD-10-CM | POA: Diagnosis not present

## 2020-09-19 DIAGNOSIS — M1990 Unspecified osteoarthritis, unspecified site: Secondary | ICD-10-CM | POA: Diagnosis not present

## 2020-09-19 DIAGNOSIS — E119 Type 2 diabetes mellitus without complications: Secondary | ICD-10-CM | POA: Diagnosis not present

## 2020-09-19 DIAGNOSIS — K219 Gastro-esophageal reflux disease without esophagitis: Secondary | ICD-10-CM | POA: Diagnosis not present

## 2020-09-19 DIAGNOSIS — K5900 Constipation, unspecified: Secondary | ICD-10-CM | POA: Diagnosis not present

## 2020-09-19 DIAGNOSIS — J989 Respiratory disorder, unspecified: Secondary | ICD-10-CM | POA: Diagnosis not present

## 2020-09-19 DIAGNOSIS — D649 Anemia, unspecified: Secondary | ICD-10-CM | POA: Diagnosis not present

## 2020-09-19 DIAGNOSIS — E785 Hyperlipidemia, unspecified: Secondary | ICD-10-CM | POA: Diagnosis not present

## 2020-09-19 DIAGNOSIS — I2510 Atherosclerotic heart disease of native coronary artery without angina pectoris: Secondary | ICD-10-CM | POA: Diagnosis not present

## 2020-09-21 DIAGNOSIS — E119 Type 2 diabetes mellitus without complications: Secondary | ICD-10-CM | POA: Diagnosis not present

## 2020-09-21 DIAGNOSIS — S82892D Other fracture of left lower leg, subsequent encounter for closed fracture with routine healing: Secondary | ICD-10-CM | POA: Diagnosis not present

## 2020-09-21 DIAGNOSIS — M1990 Unspecified osteoarthritis, unspecified site: Secondary | ICD-10-CM | POA: Diagnosis not present

## 2020-09-21 DIAGNOSIS — J989 Respiratory disorder, unspecified: Secondary | ICD-10-CM | POA: Diagnosis not present

## 2020-09-26 DIAGNOSIS — N1831 Chronic kidney disease, stage 3a: Secondary | ICD-10-CM | POA: Diagnosis not present

## 2020-09-26 DIAGNOSIS — E1122 Type 2 diabetes mellitus with diabetic chronic kidney disease: Secondary | ICD-10-CM | POA: Diagnosis not present

## 2020-09-26 DIAGNOSIS — S82892D Other fracture of left lower leg, subsequent encounter for closed fracture with routine healing: Secondary | ICD-10-CM | POA: Diagnosis not present

## 2020-09-26 DIAGNOSIS — S82899A Other fracture of unspecified lower leg, initial encounter for closed fracture: Secondary | ICD-10-CM | POA: Diagnosis not present

## 2020-09-26 DIAGNOSIS — J989 Respiratory disorder, unspecified: Secondary | ICD-10-CM | POA: Diagnosis not present

## 2020-09-26 DIAGNOSIS — E785 Hyperlipidemia, unspecified: Secondary | ICD-10-CM | POA: Diagnosis not present

## 2020-10-02 ENCOUNTER — Ambulatory Visit (INDEPENDENT_AMBULATORY_CARE_PROVIDER_SITE_OTHER): Payer: Medicare PPO | Admitting: Orthopedic Surgery

## 2020-10-02 ENCOUNTER — Ambulatory Visit: Payer: Medicare PPO

## 2020-10-02 ENCOUNTER — Other Ambulatory Visit: Payer: Self-pay

## 2020-10-02 DIAGNOSIS — S82892D Other fracture of left lower leg, subsequent encounter for closed fracture with routine healing: Secondary | ICD-10-CM | POA: Diagnosis not present

## 2020-10-02 NOTE — Progress Notes (Signed)
Post op visit #1  Chief Complaint  Patient presents with   Ankle Pain    L/ DOS 09/13/20/its burning some and having some pain.   Postop 19 days Postop x-ray is intact  He has a medial eschar above the medial wound flap he has erythema from the subcutaneous hemorrhage lateral wound looks fine  We placed Xeroform dressings posterior splint  Continue nonweightbearing  His x-ray looks good  He is in a nursing home  Recommend follow-up in 2 weeks to check the wound again

## 2020-10-03 DIAGNOSIS — M1990 Unspecified osteoarthritis, unspecified site: Secondary | ICD-10-CM | POA: Diagnosis not present

## 2020-10-03 DIAGNOSIS — S82892D Other fracture of left lower leg, subsequent encounter for closed fracture with routine healing: Secondary | ICD-10-CM | POA: Diagnosis not present

## 2020-10-03 DIAGNOSIS — N390 Urinary tract infection, site not specified: Secondary | ICD-10-CM | POA: Diagnosis not present

## 2020-10-03 DIAGNOSIS — J989 Respiratory disorder, unspecified: Secondary | ICD-10-CM | POA: Diagnosis not present

## 2020-10-05 ENCOUNTER — Telehealth: Payer: Self-pay | Admitting: Orthopedic Surgery

## 2020-10-05 NOTE — Telephone Encounter (Signed)
The insurance company and facility decide. I called her to advise Left message advised her if he does go home to make sure the facility sets him up for home physical therapy

## 2020-10-05 NOTE — Telephone Encounter (Signed)
Call via voice message received from patient's daughter Angel Norman asking about rehab relaying he is to go home tomorrow. States when patient saw Dr Romeo Apple, they understood he had two more weeks stay. Asking "who makes decision about discharge"  (I left message at ph# [phone] to acknowledge message; left message (will need to re-verify contact on file information)

## 2020-10-10 ENCOUNTER — Telehealth: Payer: Self-pay | Admitting: Orthopedic Surgery

## 2020-10-10 DIAGNOSIS — J989 Respiratory disorder, unspecified: Secondary | ICD-10-CM | POA: Diagnosis not present

## 2020-10-10 DIAGNOSIS — M1990 Unspecified osteoarthritis, unspecified site: Secondary | ICD-10-CM | POA: Diagnosis not present

## 2020-10-10 DIAGNOSIS — S82892D Other fracture of left lower leg, subsequent encounter for closed fracture with routine healing: Secondary | ICD-10-CM | POA: Diagnosis not present

## 2020-10-10 NOTE — Telephone Encounter (Signed)
Patient's daughter Dossie Arbour, [phone], relays that patient may be getting discharged from [place] facility soon, said it may or may not be by appointment date on Monday, 10/16/20. Asking if Dr Romeo Apple can order home health for daily personal needs to include bathing and dressing. Please advise.

## 2020-10-11 NOTE — Telephone Encounter (Signed)
Home health is normally ordered upon d/c from facility  If they don't order it, told her to let me know and we can. He likely cant get a daily aide, but will ask what he qualifies for.   Left message

## 2020-10-13 DIAGNOSIS — M1990 Unspecified osteoarthritis, unspecified site: Secondary | ICD-10-CM | POA: Diagnosis not present

## 2020-10-13 DIAGNOSIS — J989 Respiratory disorder, unspecified: Secondary | ICD-10-CM | POA: Diagnosis not present

## 2020-10-13 DIAGNOSIS — S82892D Other fracture of left lower leg, subsequent encounter for closed fracture with routine healing: Secondary | ICD-10-CM | POA: Diagnosis not present

## 2020-10-16 ENCOUNTER — Ambulatory Visit: Payer: Medicare PPO

## 2020-10-16 ENCOUNTER — Encounter: Payer: Self-pay | Admitting: Orthopedic Surgery

## 2020-10-16 ENCOUNTER — Ambulatory Visit (INDEPENDENT_AMBULATORY_CARE_PROVIDER_SITE_OTHER): Payer: Medicare PPO | Admitting: Orthopedic Surgery

## 2020-10-16 ENCOUNTER — Inpatient Hospital Stay (HOSPITAL_COMMUNITY)
Admission: AD | Admit: 2020-10-16 | Discharge: 2020-10-20 | DRG: 603 | Disposition: A | Discharge status: Home Health | Payer: Medicare PPO | Source: Ambulatory Visit | Attending: Orthopedic Surgery | Admitting: Orthopedic Surgery

## 2020-10-16 ENCOUNTER — Other Ambulatory Visit: Payer: Self-pay

## 2020-10-16 DIAGNOSIS — M1990 Unspecified osteoarthritis, unspecified site: Secondary | ICD-10-CM | POA: Diagnosis present

## 2020-10-16 DIAGNOSIS — S82892D Other fracture of left lower leg, subsequent encounter for closed fracture with routine healing: Secondary | ICD-10-CM

## 2020-10-16 DIAGNOSIS — Z20822 Contact with and (suspected) exposure to covid-19: Secondary | ICD-10-CM | POA: Diagnosis present

## 2020-10-16 DIAGNOSIS — Z79899 Other long term (current) drug therapy: Secondary | ICD-10-CM

## 2020-10-16 DIAGNOSIS — Z96652 Presence of left artificial knee joint: Secondary | ICD-10-CM | POA: Diagnosis present

## 2020-10-16 DIAGNOSIS — L03116 Cellulitis of left lower limb: Principal | ICD-10-CM

## 2020-10-16 DIAGNOSIS — Z7982 Long term (current) use of aspirin: Secondary | ICD-10-CM | POA: Diagnosis not present

## 2020-10-16 DIAGNOSIS — E119 Type 2 diabetes mellitus without complications: Secondary | ICD-10-CM | POA: Diagnosis present

## 2020-10-16 DIAGNOSIS — K219 Gastro-esophageal reflux disease without esophagitis: Secondary | ICD-10-CM | POA: Diagnosis not present

## 2020-10-16 DIAGNOSIS — T8142XA Infection following a procedure, deep incisional surgical site, initial encounter: Secondary | ICD-10-CM | POA: Diagnosis present

## 2020-10-16 DIAGNOSIS — E785 Hyperlipidemia, unspecified: Secondary | ICD-10-CM | POA: Diagnosis present

## 2020-10-16 DIAGNOSIS — S82892A Other fracture of left lower leg, initial encounter for closed fracture: Secondary | ICD-10-CM | POA: Diagnosis present

## 2020-10-16 DIAGNOSIS — Z7984 Long term (current) use of oral hypoglycemic drugs: Secondary | ICD-10-CM | POA: Diagnosis not present

## 2020-10-16 LAB — COMPREHENSIVE METABOLIC PANEL
ALT: 17 U/L (ref 0–44)
AST: 18 U/L (ref 15–41)
Albumin: 3.1 g/dL — ABNORMAL LOW (ref 3.5–5.0)
Alkaline Phosphatase: 71 U/L (ref 38–126)
Anion gap: 4 — ABNORMAL LOW (ref 5–15)
BUN: 18 mg/dL (ref 8–23)
CO2: 27 mmol/L (ref 22–32)
Calcium: 8.5 mg/dL — ABNORMAL LOW (ref 8.9–10.3)
Chloride: 103 mmol/L (ref 98–111)
Creatinine, Ser: 1.11 mg/dL (ref 0.61–1.24)
GFR, Estimated: >60 mL/min (ref >60)
Glucose, Bld: 97 mg/dL (ref 70–99)
Potassium: 4.2 mmol/L (ref 3.5–5.1)
Sodium: 134 mmol/L — ABNORMAL LOW (ref 135–145)
Total Bilirubin: 0.4 mg/dL (ref 0.3–1.2)
Total Protein: 7.7 g/dL (ref 6.5–8.1)

## 2020-10-16 LAB — CBC
HCT: 38.5 % — ABNORMAL LOW (ref 39.0–52.0)
Hemoglobin: 11.6 g/dL — ABNORMAL LOW (ref 13.0–17.0)
MCH: 27.3 pg (ref 26.0–34.0)
MCHC: 30.1 g/dL (ref 30.0–36.0)
MCV: 90.6 fL (ref 80.0–100.0)
Platelets: 247 10*3/uL (ref 150–400)
RBC: 4.25 MIL/uL (ref 4.22–5.81)
RDW: 18.8 % — ABNORMAL HIGH (ref 11.5–15.5)
WBC: 7.3 10*3/uL (ref 4.0–10.5)
nRBC: 0 % (ref 0.0–0.2)

## 2020-10-16 LAB — SEDIMENTATION RATE: Sed Rate: 75 mm/hr — ABNORMAL HIGH (ref 0–16)

## 2020-10-16 LAB — C-REACTIVE PROTEIN: CRP: 2.9 mg/dL — ABNORMAL HIGH (ref <1.0)

## 2020-10-16 MED ORDER — VANCOMYCIN HCL 1500 MG/300ML IV SOLN
1500.0000 mg | INTRAVENOUS | Status: DC
  Administered 2020-10-17 – 2020-10-19 (×3): 1500 mg via INTRAVENOUS
  Filled 2020-10-16 (×3): qty 300

## 2020-10-16 MED ORDER — ASPIRIN EC 81 MG PO TBEC: 81.0000 mg | DELAYED_RELEASE_TABLET | Freq: Every day | ORAL | Status: DC | PRN

## 2020-10-16 MED ORDER — VANCOMYCIN HCL 2000 MG/400ML IV SOLN
2000.0000 mg | Freq: Once | INTRAVENOUS | Status: AC
  Administered 2020-10-16: 2000 mg via INTRAVENOUS
  Filled 2020-10-16 (×2): qty 400

## 2020-10-16 MED ORDER — PRAVASTATIN SODIUM 40 MG PO TABS
80.0000 mg | ORAL_TABLET | Freq: Every day | ORAL | Status: DC
  Administered 2020-10-17 – 2020-10-20 (×4): 80 mg via ORAL
  Filled 2020-10-16 (×4): qty 2

## 2020-10-16 MED ORDER — POLYETHYLENE GLYCOL 3350 17 G PO PACK
17.0000 g | PACK | Freq: Every day | ORAL | Status: DC
  Administered 2020-10-16 – 2020-10-19 (×3): 17 g via ORAL
  Filled 2020-10-16 (×5): qty 1

## 2020-10-16 MED ORDER — HYDROCODONE-ACETAMINOPHEN 5-325 MG PO TABS
1.0000 | ORAL_TABLET | ORAL | Status: DC | PRN
  Administered 2020-10-16 – 2020-10-19 (×5): 2 via ORAL
  Administered 2020-10-19: 1 via ORAL
  Administered 2020-10-19 – 2020-10-20 (×2): 2 via ORAL
  Filled 2020-10-16 (×5): qty 2
  Filled 2020-10-16: qty 1
  Filled 2020-10-16 (×3): qty 2

## 2020-10-16 MED ORDER — MORPHINE SULFATE (PF) 2 MG/ML IV SOLN: 0.5000 mg | INTRAVENOUS | Status: DC | PRN

## 2020-10-16 MED ORDER — VANCOMYCIN HCL IN DEXTROSE 1-5 GM/200ML-% IV SOLN: 1000.0000 mg | Freq: Once | INTRAVENOUS | Status: DC

## 2020-10-16 MED ORDER — SENNA 8.6 MG PO TABS
1.0000 | ORAL_TABLET | Freq: Two times a day (BID) | ORAL | Status: DC
  Administered 2020-10-16 – 2020-10-20 (×7): 8.6 mg via ORAL
  Filled 2020-10-16 (×8): qty 1

## 2020-10-16 MED ORDER — HYDROCODONE-ACETAMINOPHEN 5-325 MG PO TABS: 1.0000 | ORAL_TABLET | ORAL | Status: DC | PRN

## 2020-10-16 MED ORDER — EMPAGLIFLOZIN 25 MG PO TABS
25.0000 mg | ORAL_TABLET | Freq: Every morning | ORAL | Status: DC
  Administered 2020-10-17 – 2020-10-20 (×4): 25 mg via ORAL
  Filled 2020-10-16 (×7): qty 1

## 2020-10-16 MED ORDER — TRAMADOL HCL 50 MG PO TABS
50.0000 mg | ORAL_TABLET | Freq: Four times a day (QID) | ORAL | Status: DC | PRN
  Administered 2020-10-16 – 2020-10-17 (×2): 50 mg via ORAL
  Filled 2020-10-16 (×2): qty 1

## 2020-10-16 MED ORDER — TAMSULOSIN HCL 0.4 MG PO CAPS
0.4000 mg | ORAL_CAPSULE | Freq: Every day | ORAL | Status: DC
  Administered 2020-10-17 – 2020-10-20 (×4): 0.4 mg via ORAL
  Filled 2020-10-16 (×4): qty 1

## 2020-10-16 MED ORDER — TRAMADOL HCL 50 MG PO TABS: 50.0000 mg | ORAL_TABLET | Freq: Four times a day (QID) | ORAL | Status: DC

## 2020-10-16 MED ORDER — PANTOPRAZOLE SODIUM 40 MG PO TBEC
40.0000 mg | DELAYED_RELEASE_TABLET | Freq: Two times a day (BID) | ORAL | Status: DC
  Administered 2020-10-16 – 2020-10-20 (×8): 40 mg via ORAL
  Filled 2020-10-16 (×8): qty 1

## 2020-10-16 MED ORDER — HEPARIN SODIUM (PORCINE) 5000 UNIT/ML IJ SOLN
5000.0000 [IU] | Freq: Three times a day (TID) | INTRAMUSCULAR | Status: DC
  Administered 2020-10-16 – 2020-10-20 (×11): 5000 [IU] via SUBCUTANEOUS
  Filled 2020-10-16 (×10): qty 1

## 2020-10-16 NOTE — Progress Notes (Signed)
Chief Complaint  Patient presents with   Post-op Follow-up    09/13/20 left ankle ORIF/ having a difficult time not weight bearing on left ankle/ drainage noted on banadge     This is an 87 year old male with diabetes had an ORIF of his left ankle 4 weeks ago developed a pressure ulcer medially and after 2 weeks he has increased redness drainage and swelling of his left medial wound  I debrided an eschar today  I took an x-ray the fixation is intact  The patient does not monitor his glucose  Recommend admission  Currently the hospital has no beds available  I have personally spoken to the admission coordinator and they are waiting beds they have no beds and the entire system  We redressed the wound and put him in a posterior splint and are awaiting further disposition

## 2020-10-16 NOTE — Progress Notes (Signed)
Patient and daughter request information regarding Advance Directives,information booklet provided and left with patient.Will continue to monitor patient.

## 2020-10-16 NOTE — H&P (Signed)
Angel Norman is an 87 y.o. male.    Chief Complaint: Drainage left ankle  HPI: 87 year old male non-insulin-dependent diabetic status post ORIF left ankle 4 weeks ago admitted for drainage from the medial wound  He has not had fever or malaise  He has not checked his sugar since he left the nursing home  No change in his overall pain levels  The medial ankle wound eschar was debrided in the office today  Past Medical History:  Diagnosis Date   Arthritis    GERD (gastroesophageal reflux disease)    Hyperlipidemia    Rib fractures 12/06/2014    Past Surgical History:  Procedure Laterality Date   ESOPHAGOGASTRODUODENOSCOPY (EGD) WITH PROPOFOL N/A 05/08/2018   Procedure: ESOPHAGOGASTRODUODENOSCOPY (EGD) WITH PROPOFOL;  Surgeon: Malissa Hippo, MD;  Location: [facility];  Service: Endoscopy;  Laterality: N/A;   goiter removal     ORIF ANKLE FRACTURE Left 09/13/2020   Procedure: OPEN REDUCTION INTERNAL FIXATION (ORIF) LEFT ANKLE FRACTURE;  Surgeon: Vickki Hearing, MD;  Location: [facility] ORS;  Service: Orthopedics;  Laterality: Left;   TOTAL KNEE ARTHROPLASTY Left 08/18/2012   Dr Lajoyce Corners   TOTAL KNEE ARTHROPLASTY Left 08/19/2012   Procedure: TOTAL KNEE ARTHROPLASTY;  Surgeon: Nadara Mustard, MD;  Location: [facility] OR;  Service: Orthopedics;  Laterality: Left;  Left Total Knee Arthroplasty    No family history on file. Social History:  reports that he has never smoked. He has never used smokeless tobacco. He reports that he does not drink alcohol and does not use drugs.  Allergies: No Known Allergies  Medications Prior to Admission  Medication Sig Dispense Refill   acetaminophen (TYLENOL) 325 MG tablet Take 2 tablets (650 mg total) by mouth every 6 (six) hours.     aspirin EC 81 MG tablet Take 81 mg by mouth daily as needed for mild pain. Swallow whole.     JARDIANCE 25 MG TABS tablet Take 25 mg by mouth every morning.     metFORMIN (GLUCOPHAGE) 1000 MG tablet Take 1,000 mg by mouth 2  (two) times daily.     pantoprazole (PROTONIX) 40 MG tablet Take 1 tablet (40 mg total) by mouth daily. Needs to use twice a day for 12 weeks for gastritis 60 tablet 0   polyethylene glycol (MIRALAX / GLYCOLAX) 17 g packet Take 17 g by mouth daily. 14 each 0   pravastatin (PRAVACHOL) 80 MG tablet Take 80 mg by mouth daily.     tamsulosin (FLOMAX) 0.4 MG CAPS capsule Take 0.4 mg by mouth daily.      No results found for this or any previous visit (from the past 48 hour(s)). DG Ankle Complete Left  Result Date: 10/16/2020 3 views left ankle Previous ankle fracture We see a lateral plate and interfrag screw and a syndesmosis screw and then a suture anchor medially.  The ankle mortise is intact There is a lot of soft tissue swelling Impression ORIF left ankle medial and lateral fixation with intact ankle mortise and soft tissue swelling   Review of Systems  Constitutional:  Negative for activity change, appetite change, chills and fever.  Respiratory:  Negative for shortness of breath.   Cardiovascular:  Negative for chest pain.  Musculoskeletal:  Positive for gait problem and joint swelling.  All other systems reviewed and are negative.  Blood pressure (!) 146/81, pulse 78, temperature 97.6 F (36.4 C), temperature source Oral, resp. rate 19, weight 133.4 kg, SpO2 96 %. Physical Exam  General appearance he is a rather large male somewhat unkept but overall otherwise normal  He is oriented to person place and time  His mood is normal his affect is normal  He is nonweightbearing on the left  His lateral ankle wound on the left is normal the medial 1 has drainage of what looks like serosanguineous and some purulent material there is an eschar near the medial malleolus and the area in question is red tender to palpation he has decreased range of motion in the ankle but it is stable his muscle tone is normal the skin is red and erythematous  There is edema on the foot there is no  lymphadenopathy sensation is diminished from diabetes pathologic reflexes none coordination balance cannot be tested   Assessment/Plan X-rays of the ankle show stable fixation with lateral plate and interfrag screw and syndesmosis screw and then medial suture anchor for ligament repair that was done at the time of surgery  This is most likely a postop wound infection and this 86 year old male diabetic  I think he should be admitted and I will admit him for IV antibiotics and control of his sugar    Fuller Canada, MD 10/16/2020, 5:19 PM

## 2020-10-16 NOTE — Progress Notes (Signed)
Pharmacy Antibiotic Note  Angel Norman is a 87 y.o. male admitted on 10/16/2020 with 4 week post-op ORIF ankle wound infection s/p I&D in office.  Pharmacy has been consulted for vancomycin dosing.  All labs pending.   8/15 Vancomycin 1500mg  Q 24 hr Scr used: 1.14 mg/dL (from ) Weight: 9/31 kg Vd coeff: 0.5 L/kg Est AUC: 437  Plan: Vancomycin 2000mg  x1 then 1500 mg Q24 hr  Monitor cultures, clinical status, renal fx Narrow abx as able and f/u duration   Weight: 133.4 kg (294 lb 1.5 oz)  Temp (24hrs), Avg:97.6 F (36.4 C), Min:97.6 F (36.4 C), Max:97.6 F (36.4 C)  No results for input(s): WBC, CREATININE, LATICACIDVEN, VANCOTROUGH, VANCOPEAK, VANCORANDOM, GENTTROUGH, GENTPEAK, GENTRANDOM, TOBRATROUGH, TOBRAPEAK, TOBRARND, AMIKACINPEAK, AMIKACINTROU, AMIKACIN in the last 168 hours.  CrCl cannot be calculated (Patient's most recent lab result is older than the maximum 21 days allowed.).    No Known Allergies  Antimicrobials this admission: Vanc 8/15 >>   Dose adjustments this admission:   Microbiology results:  Thank you for allowing pharmacy to be a part of this patient's care.   , PharmD, BCPS, BCCP Clinical Pharmacist  Please check AMION for all [facility] phone numbers After 10:00 PM, call Main Pharmacy [phone]

## 2020-10-17 LAB — SARS CORONAVIRUS 2 (TAT 6-24 HRS): SARS Coronavirus 2: NEGATIVE

## 2020-10-17 MED ORDER — MELATONIN 3 MG PO TABS
3.0000 mg | ORAL_TABLET | Freq: Every evening | ORAL | Status: DC | PRN
  Administered 2020-10-17 – 2020-10-19 (×3): 3 mg via ORAL
  Filled 2020-10-17 (×4): qty 1

## 2020-10-17 NOTE — Plan of Care (Signed)
  Problem: Acute Rehab PT Goals(only PT should resolve) Goal: Pt Will Go Supine/Side To Sit Outcome: Progressing Flowsheets (Taken 10/17/2020 1451) Pt will go Supine/Side to Sit: with modified independence Goal: Patient Will Transfer Sit To/From Stand Outcome: Progressing Flowsheets (Taken 10/17/2020 1451) Patient will transfer sit to/from stand:  with min guard assist  with supervision Goal: Pt Will Transfer Bed To Chair/Chair To Bed Outcome: Progressing Flowsheets (Taken 10/17/2020 1451) Pt will Transfer Bed to Chair/Chair to Bed:  with supervision  min guard assist Goal: Pt Will Ambulate Outcome: Progressing Flowsheets (Taken 10/17/2020 1451) Pt will Ambulate:  15 feet  with supervision  with rolling walker Note: NWB LLE   2:52 PM, 10/17/20 Ocie Bob, MPT Physical Therapist with [facility] [phone] office [phone] mobile phone

## 2020-10-17 NOTE — Evaluation (Signed)
Physical Therapy Evaluation Patient Details Name: Angel Norman MRN: [id number] DOB: 1932/09/18 Today's Date: 10/17/2020   History of Present Illness  87 year old male non-insulin-dependent diabetic status post ORIF left ankle 4 weeks ago admitted for drainage from the medial wound     He has not had fever or malaise     He has not checked his sugar since he left the nursing home     No change in his overall pain levels     The medial ankle wound eschar was debrided in the office today   Clinical Impression   Patient demonstrates good return for maintaining NWB LLE for very short distances, but had to touch left foot down frequently when walking to bathroom.  Patient states he does not put body weight through left foot when touching down and advised to only walk very short distances to avoid damaging left ankle surgery.  Patient tolerated staying up in chair after sitting up on commode in bathroom - RN/NT notified.  Patient will benefit from continued physical therapy in hospital and recommended venue below to increase strength, balance, endurance for safe ADLs and gait.      Follow Up Recommendations Home health PT;Supervision for mobility/OOB;Supervision - Intermittent    Equipment Recommendations  None recommended by PT    Recommendations for Other Services       Precautions / Restrictions Precautions Precautions: Fall Restrictions Weight Bearing Restrictions: Yes LLE Weight Bearing: Non weight bearing      Mobility  Bed Mobility Overal bed mobility: Needs Assistance Bed Mobility: Supine to Sit     Supine to sit: Supervision     General bed mobility comments: increased time, required assistance for moving bed sheets    Transfers Overall transfer level: Needs assistance   Transfers: Sit to/from Stand;Stand Pivot Transfers Sit to Stand: Min guard Stand pivot transfers: Min guard       General transfer comment: fair/good return for keeping LLE off floor during  transfer to [facility]  Ambulation/Gait Ambulation/Gait assistance: Min guard Gait Distance (Feet): 15 Feet Assistive device: Rolling walker (2 wheeled) Gait Pattern/deviations: Decreased step length - right;Decreased stride length Gait velocity: decreased   General Gait Details: labored movement with fair return for keeping LLE off floor, frequently touches left foot down without putting bodyweight on foot, no c/o increased pain  Stairs            Wheelchair Mobility    Modified Rankin (Stroke Patients Only)       Balance Overall balance assessment: Needs assistance Sitting-balance support: Feet supported;No upper extremity supported Sitting balance-Leahy Scale: Good Sitting balance - Comments: seated at EOB   Standing balance support: During functional activity;Bilateral upper extremity supported Standing balance-Leahy Scale: Fair Standing balance comment: using RW with NWB LLE                             Pertinent Vitals/Pain Pain Assessment: Faces Faces Pain Scale: Hurts a little bit Pain Location: left ankle/foot Pain Descriptors / Indicators: Sore Pain Intervention(s): Limited activity within patient's tolerance;Monitored during session;Repositioned    Home Living Family/patient expects to be discharged to:: Private residence   Available Help at Discharge: Available PRN/intermittently;Family Type of Home: House Home Access: Stairs to enter;Ramped entrance Entrance Stairs-Rails: Right;Left Entrance Stairs-Number of Steps: 3 Home Layout: Multi-level;Able to live on main level with bedroom/bathroom Home Equipment: Dan Humphreys - 2 wheels;Cane - single point;Bedside commode;Shower seat;Grab bars - tub/shower;Grab bars - toilet  Prior Function Level of Independence: Needs assistance   Gait / Transfers Assistance Needed: very short household distances using RW with NWB LLE, uses wheelchair mostly  ADL's / Homemaking Assistance Needed: assisted by  family        Hand Dominance   Dominant Hand: Left    Extremity/Trunk Assessment   Upper Extremity Assessment Upper Extremity Assessment: Overall WFL for tasks assessed    Lower Extremity Assessment Lower Extremity Assessment: Generalized weakness    Cervical / Trunk Assessment Cervical / Trunk Assessment: Normal  Communication   Communication: HOH  Cognition Arousal/Alertness: Awake/alert Behavior During Therapy: WFL for tasks assessed/performed Overall Cognitive Status: Within Functional Limits for tasks assessed                                        General Comments      Exercises     Assessment/Plan    PT Assessment Patient needs continued PT services  PT Problem List Decreased strength;Decreased activity tolerance;Decreased balance;Decreased mobility       PT Treatment Interventions DME instruction;Gait training;Stair training;Functional mobility training;Therapeutic activities;Therapeutic exercise;Patient/family education;Balance training    PT Goals (Current goals can be found in the Care Plan section)  Acute Rehab PT Goals Patient Stated Goal: return home family to assist PT Goal Formulation: With patient Time For Goal Achievement: 10/20/20 Potential to Achieve Goals: Good    Frequency Min 3X/week   Barriers to discharge        Co-evaluation               AM-PAC PT "6 Clicks" Mobility  Outcome Measure Help needed turning from your back to your side while in a flat bed without using bedrails?: None Help needed moving from lying on your back to sitting on the side of a flat bed without using bedrails?: A Little Help needed moving to and from a bed to a chair (including a wheelchair)?: A Little Help needed standing up from a chair using your arms (e.g., wheelchair or bedside chair)?: A Little Help needed to walk in hospital room?: A Little Help needed climbing 3-5 steps with a railing? : A Lot 6 Click Score: 18    End  of Session Equipment Utilized During Treatment: Other (comment) (splint left ankle foot) Activity Tolerance: Patient tolerated treatment well;Patient limited by fatigue Patient left: in chair;with call bell/phone within reach Nurse Communication: Mobility status PT Visit Diagnosis: Unsteadiness on feet (R26.81);Other abnormalities of gait and mobility (R26.89);Muscle weakness (generalized) (M62.81)    Time: 1000-1037 PT Time Calculation (min) (ACUTE ONLY): 37 min   Charges:   PT Evaluation $PT Eval Moderate Complexity: 1 Mod PT Treatments $Therapeutic Activity: 23-37 mins        2:49 PM, 10/17/20 Ocie Bob, MPT Physical Therapist with [facility] [phone] office [phone] mobile phone

## 2020-10-18 ENCOUNTER — Telehealth: Payer: Self-pay | Admitting: Orthopedic Surgery

## 2020-10-18 LAB — CREATININE, SERUM
Creatinine, Ser: 0.97 mg/dL (ref 0.61–1.24)
GFR, Estimated: >60 mL/min (ref >60)

## 2020-10-18 NOTE — Progress Notes (Signed)
Patient ID: Angel Norman, male   DOB: 1932-05-12, 87 y.o.   MRN: [id number]  HD2 Vanco day 2   Cellulitis left ankle   Dressing change   Still having some drainage but his erythema and swelling are definitely much less   CBC Latest Ref Rng & Units 10/16/2020 09/15/2020 09/14/2020  WBC 4.0 - 10.5 K/uL 7.3 7.9 9.9  Hemoglobin 13.0 - 17.0 g/dL 11.6(L) 10.7(L) 10.9(L)  Hematocrit 39.0 - 52.0 % 38.5(L) 37.2(L) 37.1(L)  Platelets 150 - 400 K/uL 247 184 170   BMP Latest Ref Rng & Units 10/18/2020 10/16/2020 09/15/2020  Glucose 70 - 99 mg/dL - 97 742(V)  BUN 8 - 23 mg/dL - 18 15  Creatinine 9.56 - 1.24 mg/dL 3.87 5.64 3.32  Sodium 135 - 145 mmol/L - 134(L) 134(L)  Potassium 3.5 - 5.1 mmol/L - 4.2 4.0  Chloride 98 - 111 mmol/L - 103 99  CO2 22 - 32 mmol/L - 27 24  Calcium 8.9 - 10.3 mg/dL - 8.5(L) 7.9(L)    Repeat the CBC C-reactive protein and the sed rate tomorrow  Plan for discharge Friday

## 2020-10-18 NOTE — Telephone Encounter (Signed)
Yes and I ll be changing it soon

## 2020-10-18 NOTE — Telephone Encounter (Signed)
Patient's daughter Angel Norman, designated contact on file, called to ask if Dr Romeo Apple will be taking cast off, or what his plan is for Angel Norman, Norman that she can try to be there when Dr Romeo Apple comes to see him in the hospital  - [phone]

## 2020-10-18 NOTE — Telephone Encounter (Signed)
Done

## 2020-10-18 NOTE — Progress Notes (Signed)
Physical Therapy Treatment Patient Details Name: Angel Norman MRN: [id number] DOB: 1933/02/11 Today's Date: 10/18/2020    History of Present Illness 87 yo male s/o L ankle ORIF 09/13/20 readmitted for drained from medial wound, no fever or malaise, no change in overall pain. The medial ankle wound eschar was debrided in the office 10/16/20. X-rays of the ankle show stable fixation. PMH: GERD, arthritis L TKA 2014    PT Comments    Pt tolerates BLE strengthening exercises. Pt ambualtes 16 ft with RW in room, prefers to "rest" LLE on floor with rest break despite cues for NWB- pt adamant he isn't putting any weight on it and denies pain. Pt reports he has a w/c at home that he can use if he needs. Pt states he is going home today, declines further ambulation or additional exercises.    Follow Up Recommendations  Home health PT;Supervision for mobility/OOB;Supervision - Intermittent     Equipment Recommendations  None recommended by PT    Recommendations for Other Services       Precautions / Restrictions Precautions Precautions: Fall Restrictions Weight Bearing Restrictions: Yes LLE Weight Bearing: Non weight bearing    Mobility  Bed Mobility Overal bed mobility: Needs Assistance Bed Mobility: Supine to Sit  Supine to sit: Supervision  General bed mobility comments: use of bedrail to upright trunk and scoot out to EOB, mod I to return to supine    Transfers Overall transfer level: Needs assistance Equipment used: Rolling walker (2 wheeled) Transfers: Sit to/from Stand Sit to Stand: Min guard  General transfer comment: BUE assisting to power up, VCs to maintain LLE off of floor with transfer with good adherence  Ambulation/Gait Ambulation/Gait assistance: Min guard Gait Distance (Feet): 16 Feet Assistive device: Rolling walker (2 wheeled) Gait Pattern/deviations: Step-to pattern Gait velocity: decreased   General Gait Details: step to pattern, pt tends to "rest" LLE on  floor for rest breaks but adamant no weight is on it despite VCs for NWB, no increased pain   Stairs             Wheelchair Mobility    Modified Rankin (Stroke Patients Only)       Balance Overall balance assessment: Needs assistance Sitting-balance support: Feet supported;No upper extremity supported Sitting balance-Leahy Scale: Good Sitting balance - Comments: seated at EOB   Standing balance support: During functional activity;Bilateral upper extremity supported Standing balance-Leahy Scale: Poor Standing balance comment: reliant on UE support, NWB LLE         Cognition Arousal/Alertness: Awake/alert Behavior During Therapy: WFL for tasks assessed/performed Overall Cognitive Status: Within Functional Limits for tasks assessed     Exercises General Exercises - Lower Extremity Ankle Circles/Pumps: Supine;AROM;Right;20 reps Short Arc Quad: Supine;AROM;Strengthening;Both;20 reps Straight Leg Raises: Supine;AROM;Strengthening;Both;15 reps    General Comments        Pertinent Vitals/Pain Pain Assessment: 0-10 Pain Score: 7  ("It's just a little bit, like a 7") Pain Location: left ankle/foot Pain Descriptors / Indicators: Sore Pain Intervention(s): Limited activity within patient's tolerance;Monitored during session;Repositioned    Home Living                      Prior Function            PT Goals (current goals can now be found in the care plan section) Acute Rehab PT Goals Patient Stated Goal: return home family to assist PT Goal Formulation: With patient Time For Goal Achievement: 10/20/20 Potential to Achieve Goals:  Good Progress towards PT goals: Progressing toward goals    Frequency    Min 3X/week      PT Plan Current plan remains appropriate    Co-evaluation              AM-PAC PT "6 Clicks" Mobility   Outcome Measure  Help needed turning from your back to your side while in a flat bed without using bedrails?:  None Help needed moving from lying on your back to sitting on the side of a flat bed without using bedrails?: A Little Help needed moving to and from a bed to a chair (including a wheelchair)?: A Little Help needed standing up from a chair using your arms (e.g., wheelchair or bedside chair)?: A Little Help needed to walk in hospital room?: A Little Help needed climbing 3-5 steps with a railing? : A Lot 6 Click Score: 18    End of Session Equipment Utilized During Treatment: Other (comment) (splint left ankle foot) Activity Tolerance: Patient tolerated treatment well;Patient limited by fatigue Patient left: in bed;with call bell/phone within reach;with bed alarm set Nurse Communication: Mobility status PT Visit Diagnosis: Unsteadiness on feet (R26.81);Other abnormalities of gait and mobility (R26.89);Muscle weakness (generalized) (M62.81)     Time: 4132-4401 PT Time Calculation (min) (ACUTE ONLY): 12 min  Charges:  $Gait Training: 8-22 mins                      Tori Jdyn Parkerson PT, DPT 10/18/20, 10:51 AM

## 2020-10-18 NOTE — Telephone Encounter (Signed)
I called to advise.  She is taking care of the farm for him Wants to know if you can call her or if I can call her when you know what the plan is for him

## 2020-10-19 LAB — SEDIMENTATION RATE: Sed Rate: 76 mm/hr — ABNORMAL HIGH (ref 0–16)

## 2020-10-19 LAB — C-REACTIVE PROTEIN: CRP: 2.6 mg/dL — ABNORMAL HIGH (ref <1.0)

## 2020-10-19 NOTE — Progress Notes (Signed)
Pharmacy Antibiotic Note  Angel Norman is a 87 y.o. male admitted on 10/16/2020 with 4 week post-op ORIF ankle wound infection s/p I&D in office.  Pharmacy has been consulted for vancomycin dosing.   Plan: Continue vancomycin 1500 mg Q24 hr  Monitor cultures, clinical status, renal fx Plan for discharge 8/19  Weight: 133.4 kg (294 lb 1.5 oz)  Temp (24hrs), Avg:97.9 F (36.6 C), Min:97.5 F (36.4 C), Max:98.3 F (36.8 C)  Recent Labs  Lab 10/16/20 1744 10/18/20 0424  WBC 7.3  --   CREATININE 1.11 0.97     Estimated Creatinine Clearance: 82 mL/min (by C-G formula based on SCr of 0.97 mg/dL).    No Known Allergies  Antimicrobials this admission: Vanc 8/15 >>    Thank you for allowing pharmacy to be a part of this patient's care.   Judeth Cornfield, PharmD Clinical Pharmacist 10/19/2020 10:40 AM

## 2020-10-19 NOTE — Progress Notes (Signed)
Patient ID: Angel Norman, male   DOB: 1932/06/13, 87 y.o.   MRN: [id number]  HD # 3   CELLULITIS LEFT FOOT   S/O orif LEFT ANKLE   BP 135/60 (BP Location: Right Arm)   Pulse 69   Temp 98.3 F (36.8 C) (Oral)   Resp 19   Wt 133.4 kg   SpO2 95%   BMI 36.76 kg/m   CBC Latest Ref Rng & Units 10/16/2020 09/15/2020 09/14/2020  WBC 4.0 - 10.5 K/uL 7.3 7.9 9.9  Hemoglobin 13.0 - 17.0 g/dL 11.6(L) 10.7(L) 10.9(L)  Hematocrit 39.0 - 52.0 % 38.5(L) 37.2(L) 37.1(L)  Platelets 150 - 400 K/uL 247 184 170   BMP Latest Ref Rng & Units 10/18/2020 10/16/2020 09/15/2020  Glucose 70 - 99 mg/dL - 97 001(V)  BUN 8 - 23 mg/dL - 18 15  Creatinine 4.94 - 1.24 mg/dL 4.96 7.59 1.63  Sodium 135 - 145 mmol/L - 134(L) 134(L)  Potassium 3.5 - 5.1 mmol/L - 4.2 4.0  Chloride 98 - 111 mmol/L - 103 99  CO2 22 - 32 mmol/L - 27 24  Calcium 8.9 - 10.3 mg/dL - 8.5(L) 7.9(L)    DRESSING CHANGED SEE PHOTOS   LOOKS BETTER EACH DAY   THERE IS NO ABSCESS   REC IV MEDS X 24 HRS THEN D/C ON PO TOMORROW

## 2020-10-20 ENCOUNTER — Encounter (HOSPITAL_COMMUNITY): Payer: Self-pay | Admitting: Orthopedic Surgery

## 2020-10-20 LAB — CREATININE, SERUM
Creatinine, Ser: 1.03 mg/dL (ref 0.61–1.24)
GFR, Estimated: >60 mL/min (ref >60)

## 2020-10-20 MED ORDER — CLINDAMYCIN HCL 300 MG PO CAPS: 300.0000 mg | ORAL_CAPSULE | Freq: Four times a day (QID) | ORAL | 1 refills | Status: AC

## 2020-10-20 NOTE — Discharge Summary (Signed)
Physician Discharge Summary  Patient ID: Angel Norman MRN: [id number] DOB/AGE: December 12, 1932 87 y.o.  Admit date: 10/16/2020 Discharge date: 10/20/2020  Admission Diagnoses: CELLULITIS LEFT FOOT, S.P ORIF LEFT ANKLE   Discharge Diagnoses: SAME Active Problems:   Closed fracture of left ankle   Cellulitis of left ankle   Discharged Condition: good  Hospital Course: I STARTED HIM ON VANCOMYCIN ON AUG 15TH. HE CONTINUED THAT UNTIL AUG 19 WITH SIGNIFICANT WOUND IMPROVEMENT DAILY   THERE WAS NEVER AN ABSCESS   HIS LAB RESULTS NEVER SHOWED ANY SIGNIF SYSTEMIC SIGNS OF INFECTION   Consults:  PHARMACY  Significant Diagnostic Studies: labs:  CBC Latest Ref Rng & Units 10/16/2020 09/15/2020 09/14/2020  WBC 4.0 - 10.5 K/uL 7.3 7.9 9.9  Hemoglobin 13.0 - 17.0 g/dL 11.6(L) 10.7(L) 10.9(L)  Hematocrit 39.0 - 52.0 % 38.5(L) 37.2(L) 37.1(L)  Platelets 150 - 400 K/uL 247 184 170     Treatments: antibiotics: vancomycin  Discharge Exam: Blood pressure (!) 141/80, pulse 73, temperature 97.8 F (36.6 C), temperature source Oral, resp. rate 16, weight 133.4 kg, SpO2 95 %. Incision/Wound:  SEE MEDIA FOR DAILY PICTURES   ESR 78 AND CRP 2.6   WBC 7  Disposition: Discharge disposition: 01-Home or Self Care      Discharge Instructions     Call MD / Call 911   Complete by: As directed    If you experience chest pain or shortness of breath, CALL 911 and be transported to the hospital emergency room.  If you develope a fever above 101 F, pus (white drainage) or increased drainage or redness at the wound, or calf pain, call your surgeon's office.   Constipation Prevention   Complete by: As directed    Drink plenty of fluids.  Prune juice may be helpful.  You may use a stool softener, such as Colace (over the counter) 100 mg twice a day.  Use MiraLax (over the counter) for constipation as needed.   Diet - low sodium heart healthy   Complete by: As directed    Discharge instructions   Complete  by: As directed    NO WEIGHT BEARING ON HIS LEFT LEG AND FOOT   CHANGE DRESSING DAILY  TELFA (2) 4 X 4  CURLEX WRAP SPLINT  TAKE ANTIBIOTICS AS ORDERED   Increase activity slowly as tolerated   Complete by: As directed    Post-operative opioid taper instructions:   Complete by: As directed    POST-OPERATIVE OPIOID TAPER INSTRUCTIONS: It is important to wean off of your opioid medication as soon as possible. If you do not need pain medication after your surgery it is ok to stop day one. Opioids include: Codeine, Hydrocodone(Norco, Vicodin), Oxycodone(Percocet, oxycontin) and hydromorphone amongst others.  Long term and even short term use of opiods can cause: Increased pain response Dependence Constipation Depression Respiratory depression And more.  Withdrawal symptoms can include Flu like symptoms Nausea, vomiting And more Techniques to manage these symptoms Hydrate well Eat regular healthy meals Stay active Use relaxation techniques(deep breathing, meditating, yoga) Do Not substitute Alcohol to help with tapering If you have been on opioids for less than two weeks and do not have pain than it is ok to stop all together.  Plan to wean off of opioids This plan should start within one week post op of your joint replacement. Maintain the same interval or time between taking each dose and first decrease the dose.  Cut the total daily intake of opioids by one tablet  each day Next start to increase the time between doses. The last dose that should be eliminated is the evening dose.         Allergies as of 10/20/2020   No Known Allergies      Medication List     TAKE these medications    acetaminophen 325 MG tablet Commonly known as: TYLENOL Take 2 tablets (650 mg total) by mouth every 6 (six) hours.   aspirin EC 81 MG tablet Take 81 mg by mouth daily as needed for mild pain. Swallow whole.   clindamycin 300 MG capsule Commonly known as: CLEOCIN Take 1  capsule (300 mg total) by mouth 4 (four) times daily for 28 days.   Jardiance 25 MG Tabs tablet Generic drug: empagliflozin Take 25 mg by mouth every morning.   metFORMIN 1000 MG tablet Commonly known as: GLUCOPHAGE Take 1,000 mg by mouth 2 (two) times daily.   pantoprazole 40 MG tablet Commonly known as: Protonix Take 1 tablet (40 mg total) by mouth daily. Needs to use twice a day for 12 weeks for gastritis   polyethylene glycol 17 g packet Commonly known as: MIRALAX / GLYCOLAX Take 17 g by mouth daily.   pravastatin 80 MG tablet Commonly known as: PRAVACHOL Take 80 mg by mouth daily.   tamsulosin 0.4 MG Caps capsule Commonly known as: FLOMAX Take 0.4 mg by mouth daily.   traMADol 50 MG tablet Commonly known as: ULTRAM Take 50 mg by mouth every 6 (six) hours as needed.        Follow-up Information     Carole Civil, MD Follow up on 10/25/2020.   Specialties: Orthopedic Surgery, Radiology Why: For wound re-check Contact information: [address] [place] [address] [phone]                 Signed: Arther Abbott 10/20/2020, 9:29 AM

## 2020-10-20 NOTE — Care Management Important Message (Signed)
Important Message  Patient Details  Name: Angel Norman MRN: [id number] Date of Birth: 07/09/1932   Medicare Important Message Given:  Yes     Corey Harold 10/20/2020, 12:08 PM

## 2020-10-22 DIAGNOSIS — K219 Gastro-esophageal reflux disease without esophagitis: Secondary | ICD-10-CM | POA: Diagnosis not present

## 2020-10-22 DIAGNOSIS — N400 Enlarged prostate without lower urinary tract symptoms: Secondary | ICD-10-CM | POA: Diagnosis not present

## 2020-10-22 DIAGNOSIS — M1990 Unspecified osteoarthritis, unspecified site: Secondary | ICD-10-CM | POA: Diagnosis not present

## 2020-10-22 DIAGNOSIS — I2510 Atherosclerotic heart disease of native coronary artery without angina pectoris: Secondary | ICD-10-CM | POA: Diagnosis not present

## 2020-10-22 DIAGNOSIS — S82892D Other fracture of left lower leg, subsequent encounter for closed fracture with routine healing: Secondary | ICD-10-CM | POA: Diagnosis not present

## 2020-10-22 DIAGNOSIS — E7800 Pure hypercholesterolemia, unspecified: Secondary | ICD-10-CM | POA: Diagnosis not present

## 2020-10-22 DIAGNOSIS — N189 Chronic kidney disease, unspecified: Secondary | ICD-10-CM | POA: Diagnosis not present

## 2020-10-22 DIAGNOSIS — L03116 Cellulitis of left lower limb: Secondary | ICD-10-CM | POA: Diagnosis not present

## 2020-10-22 DIAGNOSIS — E1122 Type 2 diabetes mellitus with diabetic chronic kidney disease: Secondary | ICD-10-CM | POA: Diagnosis not present

## 2020-10-23 ENCOUNTER — Telehealth: Payer: Self-pay | Admitting: Orthopedic Surgery

## 2020-10-23 NOTE — Telephone Encounter (Signed)
Call from patient's daughter [name], designated contact, following up after [facility] visit over weekend. Said Dr Romeo Apple was to receive a call or communication about his wound, and described information about the wound 'oozing'.  I offered appointment today, 10/23/20, Vs. Waiting till Wednesday, 10/25/20, per hospital discharge instructions.  Daughter is awaiting for [place] to receive the information from [facility]. Please call daughter at #[phone].

## 2020-10-23 NOTE — Telephone Encounter (Signed)
Call received from [facility], nurse with [facility], ph# [phone], asking for orders for:  - skilled nursing wound care: 2 times a week for 1 week                                        Then 1 time a week for 8 weeks.  - also requests to use Thera-honey to debride?  Also provided a cell number to talk with her personally (said does not normally provide): [phone]

## 2020-10-23 NOTE — Telephone Encounter (Signed)
Called nurse Archie Patten to let her know that Dr Romeo Apple approves the frequency of the wound care, and the Thera honey which she states will debride the wound  She said she could order do not need to send in rx

## 2020-10-23 NOTE — Telephone Encounter (Signed)
Done

## 2020-10-24 DIAGNOSIS — E7800 Pure hypercholesterolemia, unspecified: Secondary | ICD-10-CM | POA: Diagnosis not present

## 2020-10-24 DIAGNOSIS — I2510 Atherosclerotic heart disease of native coronary artery without angina pectoris: Secondary | ICD-10-CM | POA: Diagnosis not present

## 2020-10-24 DIAGNOSIS — S82892D Other fracture of left lower leg, subsequent encounter for closed fracture with routine healing: Secondary | ICD-10-CM | POA: Diagnosis not present

## 2020-10-24 DIAGNOSIS — E1122 Type 2 diabetes mellitus with diabetic chronic kidney disease: Secondary | ICD-10-CM | POA: Diagnosis not present

## 2020-10-24 DIAGNOSIS — K219 Gastro-esophageal reflux disease without esophagitis: Secondary | ICD-10-CM | POA: Diagnosis not present

## 2020-10-24 DIAGNOSIS — N189 Chronic kidney disease, unspecified: Secondary | ICD-10-CM | POA: Diagnosis not present

## 2020-10-24 DIAGNOSIS — N400 Enlarged prostate without lower urinary tract symptoms: Secondary | ICD-10-CM | POA: Diagnosis not present

## 2020-10-24 DIAGNOSIS — M1990 Unspecified osteoarthritis, unspecified site: Secondary | ICD-10-CM | POA: Diagnosis not present

## 2020-10-24 DIAGNOSIS — L03116 Cellulitis of left lower limb: Secondary | ICD-10-CM | POA: Diagnosis not present

## 2020-10-24 NOTE — Telephone Encounter (Signed)
I spoke back with patient's daughter; appointment for Wednesday, 10/25/20 scheduled and confirmed.

## 2020-10-25 ENCOUNTER — Other Ambulatory Visit: Payer: Self-pay

## 2020-10-25 ENCOUNTER — Telehealth: Payer: Self-pay | Admitting: Radiology

## 2020-10-25 ENCOUNTER — Ambulatory Visit (INDEPENDENT_AMBULATORY_CARE_PROVIDER_SITE_OTHER): Payer: Medicare PPO | Admitting: Orthopedic Surgery

## 2020-10-25 DIAGNOSIS — S82892D Other fracture of left lower leg, subsequent encounter for closed fracture with routine healing: Secondary | ICD-10-CM

## 2020-10-25 NOTE — Progress Notes (Signed)
Chief Complaint  Patient presents with   Routine Post Op    DOS 09/13/20    Hospital follow-up status post IV antibiotics with vancomycin for 4 to 5 days now on clindamycin 304 times a day  He also has ORIF of his ankle on July 13 with lateral fixation and medial ligament repair  Recommend he start the honey dressing as recommended by wound care we placed a Xeroform nonstick dressing on  I will see him in 2 weeks continue nonweightbearing  X-ray in 2 weeks

## 2020-10-25 NOTE — Telephone Encounter (Signed)
Spoke to physical therapy gave verbal orders for him qoweek to help him maintain NWB with walker   Asked for her to check with the nurse about honey dressings, if delay let us know, may need to change to something different.   She will check with the nurse.

## 2020-10-25 NOTE — Telephone Encounter (Signed)
Supervisor Maggie states the dressings / honey and telfa have been shipped and should arrive soon, as soon as they arrive they will start the honey dressings  FYI only

## 2020-10-27 DIAGNOSIS — N189 Chronic kidney disease, unspecified: Secondary | ICD-10-CM | POA: Diagnosis not present

## 2020-10-27 DIAGNOSIS — S82892D Other fracture of left lower leg, subsequent encounter for closed fracture with routine healing: Secondary | ICD-10-CM | POA: Diagnosis not present

## 2020-10-27 DIAGNOSIS — K219 Gastro-esophageal reflux disease without esophagitis: Secondary | ICD-10-CM | POA: Diagnosis not present

## 2020-10-27 DIAGNOSIS — N400 Enlarged prostate without lower urinary tract symptoms: Secondary | ICD-10-CM | POA: Diagnosis not present

## 2020-10-27 DIAGNOSIS — E7800 Pure hypercholesterolemia, unspecified: Secondary | ICD-10-CM | POA: Diagnosis not present

## 2020-10-27 DIAGNOSIS — L03116 Cellulitis of left lower limb: Secondary | ICD-10-CM | POA: Diagnosis not present

## 2020-10-27 DIAGNOSIS — I2510 Atherosclerotic heart disease of native coronary artery without angina pectoris: Secondary | ICD-10-CM | POA: Diagnosis not present

## 2020-10-27 DIAGNOSIS — E1122 Type 2 diabetes mellitus with diabetic chronic kidney disease: Secondary | ICD-10-CM | POA: Diagnosis not present

## 2020-10-27 DIAGNOSIS — M1990 Unspecified osteoarthritis, unspecified site: Secondary | ICD-10-CM | POA: Diagnosis not present

## 2020-10-30 DIAGNOSIS — E1122 Type 2 diabetes mellitus with diabetic chronic kidney disease: Secondary | ICD-10-CM | POA: Diagnosis not present

## 2020-10-30 DIAGNOSIS — E7800 Pure hypercholesterolemia, unspecified: Secondary | ICD-10-CM | POA: Diagnosis not present

## 2020-10-30 DIAGNOSIS — L03116 Cellulitis of left lower limb: Secondary | ICD-10-CM | POA: Diagnosis not present

## 2020-10-30 DIAGNOSIS — N189 Chronic kidney disease, unspecified: Secondary | ICD-10-CM | POA: Diagnosis not present

## 2020-10-30 DIAGNOSIS — N400 Enlarged prostate without lower urinary tract symptoms: Secondary | ICD-10-CM | POA: Diagnosis not present

## 2020-10-30 DIAGNOSIS — K219 Gastro-esophageal reflux disease without esophagitis: Secondary | ICD-10-CM | POA: Diagnosis not present

## 2020-10-30 DIAGNOSIS — S82892D Other fracture of left lower leg, subsequent encounter for closed fracture with routine healing: Secondary | ICD-10-CM | POA: Diagnosis not present

## 2020-10-30 DIAGNOSIS — M1990 Unspecified osteoarthritis, unspecified site: Secondary | ICD-10-CM | POA: Diagnosis not present

## 2020-10-30 DIAGNOSIS — I2510 Atherosclerotic heart disease of native coronary artery without angina pectoris: Secondary | ICD-10-CM | POA: Diagnosis not present

## 2020-10-31 ENCOUNTER — Telehealth: Payer: Self-pay | Admitting: Radiology

## 2020-10-31 DIAGNOSIS — L03116 Cellulitis of left lower limb: Secondary | ICD-10-CM | POA: Diagnosis not present

## 2020-10-31 DIAGNOSIS — K219 Gastro-esophageal reflux disease without esophagitis: Secondary | ICD-10-CM | POA: Diagnosis not present

## 2020-10-31 DIAGNOSIS — E7800 Pure hypercholesterolemia, unspecified: Secondary | ICD-10-CM | POA: Diagnosis not present

## 2020-10-31 DIAGNOSIS — E1122 Type 2 diabetes mellitus with diabetic chronic kidney disease: Secondary | ICD-10-CM | POA: Diagnosis not present

## 2020-10-31 DIAGNOSIS — S82892D Other fracture of left lower leg, subsequent encounter for closed fracture with routine healing: Secondary | ICD-10-CM | POA: Diagnosis not present

## 2020-10-31 DIAGNOSIS — M1990 Unspecified osteoarthritis, unspecified site: Secondary | ICD-10-CM | POA: Diagnosis not present

## 2020-10-31 DIAGNOSIS — N189 Chronic kidney disease, unspecified: Secondary | ICD-10-CM | POA: Diagnosis not present

## 2020-10-31 DIAGNOSIS — N400 Enlarged prostate without lower urinary tract symptoms: Secondary | ICD-10-CM | POA: Diagnosis not present

## 2020-10-31 DIAGNOSIS — I2510 Atherosclerotic heart disease of native coronary artery without angina pectoris: Secondary | ICD-10-CM | POA: Diagnosis not present

## 2020-10-31 NOTE — Telephone Encounter (Signed)
Daughter [name] called, wants to clarify dressing change times per week.  They had been told originally to change it daily.  Now wound care RN is to come once a week, and [name] says the nurse told her that the family is to change the dressing once a week as well, making total of 2 x's week dressing changes.  Please call her to clarify.  She will be at patient's house in 10 minutes or so.  Please call her and clarify?  Thanks.

## 2020-10-31 NOTE — Telephone Encounter (Signed)
I called we discussed if honey dressings twice week if not they need to be changed more often, she said she can do it, but needs ace wraps, I left some for her at desk.

## 2020-11-01 ENCOUNTER — Telehealth: Payer: Self-pay | Admitting: Orthopedic Surgery

## 2020-11-01 ENCOUNTER — Telehealth: Payer: Self-pay

## 2020-11-01 NOTE — Telephone Encounter (Signed)
I called daughter [name] to let her know and she states the honey dressings just arrived, she will see if they can go out tomorrow to put the honey dressing on. If not she will let me know.

## 2020-11-01 NOTE — Telephone Encounter (Signed)
Home Health Nurse needs order for [facility] OT.  1x 3wk Once every 2 weeks x 4

## 2020-11-01 NOTE — Telephone Encounter (Signed)
Patient daughter came in to get the supplies and she asked to show a picture to the doctor of her dads foot.  I advised her we do not have a doctor in the office this afternoon.    She walked out.    Please call the daughter back.

## 2020-11-01 NOTE — Telephone Encounter (Signed)
Called her with the verbal orders  Asked her when honey dressings will start, hopefully soon

## 2020-11-01 NOTE — Telephone Encounter (Signed)
We thought the honey dressings would be there by now, I had left message to see when they would be in, but [place] states the honey dressing shipment was lost by [employer]< they can do silver alginate until the honey dressing arrives, gave the order ok to do the silver alginate until then.

## 2020-11-01 NOTE — Telephone Encounter (Signed)
Angel Norman from [facility] called and left a message at 2:17 pm and states the patient is irate and the medication has not been delivered yet and she is wanting to know if we can do something else until the medicine comes in.  She is requesting a call back at [phone].

## 2020-11-01 NOTE — Telephone Encounter (Signed)
I called daughter we discussed The honey dressings arrived Someone will go out tomorrow to put the honey dressing on and show them how it goes on.

## 2020-11-02 ENCOUNTER — Telehealth: Payer: Self-pay | Admitting: Orthopedic Surgery

## 2020-11-02 DIAGNOSIS — K219 Gastro-esophageal reflux disease without esophagitis: Secondary | ICD-10-CM | POA: Diagnosis not present

## 2020-11-02 DIAGNOSIS — E1122 Type 2 diabetes mellitus with diabetic chronic kidney disease: Secondary | ICD-10-CM | POA: Diagnosis not present

## 2020-11-02 DIAGNOSIS — S82892D Other fracture of left lower leg, subsequent encounter for closed fracture with routine healing: Secondary | ICD-10-CM | POA: Diagnosis not present

## 2020-11-02 DIAGNOSIS — N400 Enlarged prostate without lower urinary tract symptoms: Secondary | ICD-10-CM | POA: Diagnosis not present

## 2020-11-02 DIAGNOSIS — I2510 Atherosclerotic heart disease of native coronary artery without angina pectoris: Secondary | ICD-10-CM | POA: Diagnosis not present

## 2020-11-02 DIAGNOSIS — E7800 Pure hypercholesterolemia, unspecified: Secondary | ICD-10-CM | POA: Diagnosis not present

## 2020-11-02 DIAGNOSIS — L03116 Cellulitis of left lower limb: Secondary | ICD-10-CM | POA: Diagnosis not present

## 2020-11-02 DIAGNOSIS — N189 Chronic kidney disease, unspecified: Secondary | ICD-10-CM | POA: Diagnosis not present

## 2020-11-02 DIAGNOSIS — M1990 Unspecified osteoarthritis, unspecified site: Secondary | ICD-10-CM | POA: Diagnosis not present

## 2020-11-02 NOTE — Telephone Encounter (Signed)
Call received from [facility] per [facility] - states has received nursing orders, but is needing verbal orders for occupational therapy. Please advise.

## 2020-11-03 NOTE — Telephone Encounter (Signed)
Noted - was not aware message sent as urgent, and at time of message was not aware of clinic staffing Friday, 11/03/20. Thank you

## 2020-11-03 NOTE — Telephone Encounter (Signed)
Called to give verbal orders  FYI this is not urgent can always wait until the next day, they get written orders, then have to confirm with verbal, never urgent message when they need verbal, unless more that a couple days I am out.

## 2020-11-08 ENCOUNTER — Ambulatory Visit: Payer: Medicare PPO

## 2020-11-08 ENCOUNTER — Other Ambulatory Visit: Payer: Self-pay

## 2020-11-08 ENCOUNTER — Ambulatory Visit (INDEPENDENT_AMBULATORY_CARE_PROVIDER_SITE_OTHER): Payer: Medicare PPO | Admitting: Orthopedic Surgery

## 2020-11-08 ENCOUNTER — Encounter: Payer: Self-pay | Admitting: Orthopedic Surgery

## 2020-11-08 ENCOUNTER — Telehealth: Payer: Self-pay | Admitting: Orthopedic Surgery

## 2020-11-08 DIAGNOSIS — S82892D Other fracture of left lower leg, subsequent encounter for closed fracture with routine healing: Secondary | ICD-10-CM | POA: Diagnosis not present

## 2020-11-08 DIAGNOSIS — E7800 Pure hypercholesterolemia, unspecified: Secondary | ICD-10-CM | POA: Diagnosis not present

## 2020-11-08 DIAGNOSIS — E1122 Type 2 diabetes mellitus with diabetic chronic kidney disease: Secondary | ICD-10-CM | POA: Diagnosis not present

## 2020-11-08 DIAGNOSIS — L03116 Cellulitis of left lower limb: Secondary | ICD-10-CM | POA: Diagnosis not present

## 2020-11-08 DIAGNOSIS — K219 Gastro-esophageal reflux disease without esophagitis: Secondary | ICD-10-CM | POA: Diagnosis not present

## 2020-11-08 DIAGNOSIS — N189 Chronic kidney disease, unspecified: Secondary | ICD-10-CM | POA: Diagnosis not present

## 2020-11-08 DIAGNOSIS — I2510 Atherosclerotic heart disease of native coronary artery without angina pectoris: Secondary | ICD-10-CM | POA: Diagnosis not present

## 2020-11-08 DIAGNOSIS — M1990 Unspecified osteoarthritis, unspecified site: Secondary | ICD-10-CM | POA: Diagnosis not present

## 2020-11-08 DIAGNOSIS — N400 Enlarged prostate without lower urinary tract symptoms: Secondary | ICD-10-CM | POA: Diagnosis not present

## 2020-11-08 MED ORDER — TRAMADOL HCL 50 MG PO TABS: 50.0000 mg | ORAL_TABLET | Freq: Four times a day (QID) | ORAL | 0 refills | Status: DC | PRN

## 2020-11-08 MED ORDER — SULFAMETHOXAZOLE-TRIMETHOPRIM 800-160 MG PO TABS: 1.0000 | ORAL_TABLET | Freq: Two times a day (BID) | ORAL | 0 refills | Status: DC

## 2020-11-08 NOTE — Progress Notes (Signed)
Chief Complaint  Patient presents with   Wound Check    Honey dressings by home health seem to be helping decreased drainage noted on bandage today    Post-op Follow-up    ORIF left ankle 09/13/20   His wound is doing much better   See media pics  New post splint   Xrays ankle fixation is intact   Meds ordered this encounter  Medications   traMADol (ULTRAM) 50 MG tablet    Sig: Take 1 tablet (50 mg total) by mouth every 6 (six) hours as needed.    Dispense:  30 tablet    Refill:  0   sulfamethoxazole-trimethoprim (BACTRIM DS) 800-160 MG tablet    Sig: Take 1 tablet by mouth 2 (two) times daily.    Dispense:  60 tablet    Refill:  0     Fu 2 weeks wound check   Continue honey dressing

## 2020-11-08 NOTE — Telephone Encounter (Signed)
Angel Norman daughter [name] called and left a voicemail.  She said that Dr Romeo Apple gave her Dad a different antibiotic, Sulfa.  She said he had been on Clindamycin.  She wants to know why the medicine was changed.  Would you call her?  Thanks

## 2020-11-09 ENCOUNTER — Telehealth: Payer: Self-pay | Admitting: Orthopedic Surgery

## 2020-11-09 DIAGNOSIS — E1122 Type 2 diabetes mellitus with diabetic chronic kidney disease: Secondary | ICD-10-CM | POA: Diagnosis not present

## 2020-11-09 DIAGNOSIS — N189 Chronic kidney disease, unspecified: Secondary | ICD-10-CM | POA: Diagnosis not present

## 2020-11-09 DIAGNOSIS — I2510 Atherosclerotic heart disease of native coronary artery without angina pectoris: Secondary | ICD-10-CM | POA: Diagnosis not present

## 2020-11-09 DIAGNOSIS — L03116 Cellulitis of left lower limb: Secondary | ICD-10-CM | POA: Diagnosis not present

## 2020-11-09 DIAGNOSIS — E7800 Pure hypercholesterolemia, unspecified: Secondary | ICD-10-CM | POA: Diagnosis not present

## 2020-11-09 DIAGNOSIS — K219 Gastro-esophageal reflux disease without esophagitis: Secondary | ICD-10-CM | POA: Diagnosis not present

## 2020-11-09 DIAGNOSIS — S82892D Other fracture of left lower leg, subsequent encounter for closed fracture with routine healing: Secondary | ICD-10-CM | POA: Diagnosis not present

## 2020-11-09 DIAGNOSIS — M1990 Unspecified osteoarthritis, unspecified site: Secondary | ICD-10-CM | POA: Diagnosis not present

## 2020-11-09 DIAGNOSIS — N400 Enlarged prostate without lower urinary tract symptoms: Secondary | ICD-10-CM | POA: Diagnosis not present

## 2020-11-09 NOTE — Telephone Encounter (Signed)
Also received a call back from patient's daughter/designated contact, [name], [phone], asking if she would need to discontinue the previous antibiotic and use only the newly prescribed antibiotic. Please advise.

## 2020-11-09 NOTE — Telephone Encounter (Signed)
Returned phone call advising patient to give patient both Bactrim and Cleocin

## 2020-11-11 DIAGNOSIS — E1122 Type 2 diabetes mellitus with diabetic chronic kidney disease: Secondary | ICD-10-CM | POA: Diagnosis not present

## 2020-11-11 DIAGNOSIS — M1990 Unspecified osteoarthritis, unspecified site: Secondary | ICD-10-CM | POA: Diagnosis not present

## 2020-11-11 DIAGNOSIS — E7800 Pure hypercholesterolemia, unspecified: Secondary | ICD-10-CM | POA: Diagnosis not present

## 2020-11-11 DIAGNOSIS — I2510 Atherosclerotic heart disease of native coronary artery without angina pectoris: Secondary | ICD-10-CM | POA: Diagnosis not present

## 2020-11-11 DIAGNOSIS — L03116 Cellulitis of left lower limb: Secondary | ICD-10-CM | POA: Diagnosis not present

## 2020-11-11 DIAGNOSIS — N189 Chronic kidney disease, unspecified: Secondary | ICD-10-CM | POA: Diagnosis not present

## 2020-11-11 DIAGNOSIS — N400 Enlarged prostate without lower urinary tract symptoms: Secondary | ICD-10-CM | POA: Diagnosis not present

## 2020-11-11 DIAGNOSIS — S82892D Other fracture of left lower leg, subsequent encounter for closed fracture with routine healing: Secondary | ICD-10-CM | POA: Diagnosis not present

## 2020-11-11 DIAGNOSIS — K219 Gastro-esophageal reflux disease without esophagitis: Secondary | ICD-10-CM | POA: Diagnosis not present

## 2020-11-13 DIAGNOSIS — E7800 Pure hypercholesterolemia, unspecified: Secondary | ICD-10-CM | POA: Diagnosis not present

## 2020-11-13 DIAGNOSIS — M1990 Unspecified osteoarthritis, unspecified site: Secondary | ICD-10-CM | POA: Diagnosis not present

## 2020-11-13 DIAGNOSIS — K219 Gastro-esophageal reflux disease without esophagitis: Secondary | ICD-10-CM | POA: Diagnosis not present

## 2020-11-13 DIAGNOSIS — L03116 Cellulitis of left lower limb: Secondary | ICD-10-CM | POA: Diagnosis not present

## 2020-11-13 DIAGNOSIS — E1122 Type 2 diabetes mellitus with diabetic chronic kidney disease: Secondary | ICD-10-CM | POA: Diagnosis not present

## 2020-11-13 DIAGNOSIS — N189 Chronic kidney disease, unspecified: Secondary | ICD-10-CM | POA: Diagnosis not present

## 2020-11-13 DIAGNOSIS — S82892D Other fracture of left lower leg, subsequent encounter for closed fracture with routine healing: Secondary | ICD-10-CM | POA: Diagnosis not present

## 2020-11-13 DIAGNOSIS — I2510 Atherosclerotic heart disease of native coronary artery without angina pectoris: Secondary | ICD-10-CM | POA: Diagnosis not present

## 2020-11-13 DIAGNOSIS — N400 Enlarged prostate without lower urinary tract symptoms: Secondary | ICD-10-CM | POA: Diagnosis not present

## 2020-11-14 ENCOUNTER — Telehealth: Payer: Self-pay | Admitting: Orthopedic Surgery

## 2020-11-14 NOTE — Telephone Encounter (Signed)
Angel Norman from [facility] called and wanted Dr. Romeo Apple to know they were holding off on therapy for Angel Norman until he is seen again and his weight bearing status is increased.  If any questions you may call Angel Norman at [phone]  Thanks

## 2020-11-14 NOTE — Telephone Encounter (Signed)
Randie Heinz will let her know when WB status changes

## 2020-11-20 DIAGNOSIS — E7800 Pure hypercholesterolemia, unspecified: Secondary | ICD-10-CM | POA: Diagnosis not present

## 2020-11-20 DIAGNOSIS — E1122 Type 2 diabetes mellitus with diabetic chronic kidney disease: Secondary | ICD-10-CM | POA: Diagnosis not present

## 2020-11-20 DIAGNOSIS — N400 Enlarged prostate without lower urinary tract symptoms: Secondary | ICD-10-CM | POA: Diagnosis not present

## 2020-11-20 DIAGNOSIS — I2510 Atherosclerotic heart disease of native coronary artery without angina pectoris: Secondary | ICD-10-CM | POA: Diagnosis not present

## 2020-11-20 DIAGNOSIS — M1990 Unspecified osteoarthritis, unspecified site: Secondary | ICD-10-CM | POA: Diagnosis not present

## 2020-11-20 DIAGNOSIS — K219 Gastro-esophageal reflux disease without esophagitis: Secondary | ICD-10-CM | POA: Diagnosis not present

## 2020-11-20 DIAGNOSIS — S82892D Other fracture of left lower leg, subsequent encounter for closed fracture with routine healing: Secondary | ICD-10-CM | POA: Diagnosis not present

## 2020-11-20 DIAGNOSIS — L03116 Cellulitis of left lower limb: Secondary | ICD-10-CM | POA: Diagnosis not present

## 2020-11-20 DIAGNOSIS — N189 Chronic kidney disease, unspecified: Secondary | ICD-10-CM | POA: Diagnosis not present

## 2020-11-21 DIAGNOSIS — K219 Gastro-esophageal reflux disease without esophagitis: Secondary | ICD-10-CM | POA: Diagnosis not present

## 2020-11-21 DIAGNOSIS — N189 Chronic kidney disease, unspecified: Secondary | ICD-10-CM | POA: Diagnosis not present

## 2020-11-21 DIAGNOSIS — S82892D Other fracture of left lower leg, subsequent encounter for closed fracture with routine healing: Secondary | ICD-10-CM | POA: Diagnosis not present

## 2020-11-21 DIAGNOSIS — E1122 Type 2 diabetes mellitus with diabetic chronic kidney disease: Secondary | ICD-10-CM | POA: Diagnosis not present

## 2020-11-21 DIAGNOSIS — M1990 Unspecified osteoarthritis, unspecified site: Secondary | ICD-10-CM | POA: Diagnosis not present

## 2020-11-21 DIAGNOSIS — E7800 Pure hypercholesterolemia, unspecified: Secondary | ICD-10-CM | POA: Diagnosis not present

## 2020-11-21 DIAGNOSIS — I2510 Atherosclerotic heart disease of native coronary artery without angina pectoris: Secondary | ICD-10-CM | POA: Diagnosis not present

## 2020-11-21 DIAGNOSIS — N400 Enlarged prostate without lower urinary tract symptoms: Secondary | ICD-10-CM | POA: Diagnosis not present

## 2020-11-21 DIAGNOSIS — L03116 Cellulitis of left lower limb: Secondary | ICD-10-CM | POA: Diagnosis not present

## 2020-11-22 ENCOUNTER — Encounter: Payer: Self-pay | Admitting: Orthopedic Surgery

## 2020-11-22 ENCOUNTER — Ambulatory Visit (INDEPENDENT_AMBULATORY_CARE_PROVIDER_SITE_OTHER): Payer: Medicare PPO | Admitting: Orthopedic Surgery

## 2020-11-22 ENCOUNTER — Other Ambulatory Visit: Payer: Self-pay

## 2020-11-22 DIAGNOSIS — L02419 Cutaneous abscess of limb, unspecified: Secondary | ICD-10-CM

## 2020-11-22 DIAGNOSIS — S82899A Other fracture of unspecified lower leg, initial encounter for closed fracture: Secondary | ICD-10-CM

## 2020-11-22 DIAGNOSIS — S82892D Other fracture of left lower leg, subsequent encounter for closed fracture with routine healing: Secondary | ICD-10-CM

## 2020-11-22 NOTE — Patient Instructions (Signed)
While we are working on your approval for MRI please go ahead and call to schedule your appointment with [facility] within at least one (1) week.   Central Scheduling [phone]  

## 2020-11-22 NOTE — Progress Notes (Signed)
Chief Complaint  Patient presents with   Post-op Follow-up    Left ankle DOS 09/13/20   Patient has been on Bactrim since September 70 been on clindamycin since early in the postop.  He is postop day #70 which is 10 weeks postop.  His x-rays of the left great he has a medial soft tissue anchor  There is a small ulcer medially with a lot of surrounding erythema and tenderness without obvious abscess  The question is now aware that he should go on something IV continue the p.o. perhaps get an imaging study to see if there is a fluid collection that can be drained  I think at this time I am going to order an MRI of his ankle to see if he has an abscess and if he does not then to continue with a PICC line outpatient IV antibiotics  Encounter Diagnoses  Name Primary?   Closed fracture of left ankle with routine healing, subsequent encounter s/o ORIF 09/13/20    Other fracture of unspecified lower leg, initial encounter for closed fracture    Abscess of ankle Yes

## 2020-11-28 ENCOUNTER — Telehealth: Payer: Self-pay | Admitting: Orthopedic Surgery

## 2020-11-28 ENCOUNTER — Other Ambulatory Visit: Payer: Self-pay

## 2020-11-28 ENCOUNTER — Encounter (HOSPITAL_COMMUNITY): Payer: Self-pay

## 2020-11-28 ENCOUNTER — Emergency Department (HOSPITAL_COMMUNITY): Payer: Medicare PPO

## 2020-11-28 ENCOUNTER — Observation Stay (HOSPITAL_COMMUNITY)
Admission: EM | Admit: 2020-11-28 | Discharge: 2020-11-29 | Disposition: A | Discharge status: Home / Self Care | Payer: Medicare PPO | Attending: Family Medicine | Admitting: Family Medicine

## 2020-11-28 ENCOUNTER — Other Ambulatory Visit: Payer: Self-pay | Admitting: Radiology

## 2020-11-28 DIAGNOSIS — E785 Hyperlipidemia, unspecified: Secondary | ICD-10-CM

## 2020-11-28 DIAGNOSIS — Z7982 Long term (current) use of aspirin: Secondary | ICD-10-CM | POA: Diagnosis not present

## 2020-11-28 DIAGNOSIS — E782 Mixed hyperlipidemia: Secondary | ICD-10-CM | POA: Diagnosis not present

## 2020-11-28 DIAGNOSIS — R0902 Hypoxemia: Secondary | ICD-10-CM | POA: Diagnosis not present

## 2020-11-28 DIAGNOSIS — G4489 Other headache syndrome: Secondary | ICD-10-CM | POA: Diagnosis not present

## 2020-11-28 DIAGNOSIS — Z7984 Long term (current) use of oral hypoglycemic drugs: Secondary | ICD-10-CM | POA: Insufficient documentation

## 2020-11-28 DIAGNOSIS — R197 Diarrhea, unspecified: Secondary | ICD-10-CM

## 2020-11-28 DIAGNOSIS — E1165 Type 2 diabetes mellitus with hyperglycemia: Secondary | ICD-10-CM | POA: Diagnosis not present

## 2020-11-28 DIAGNOSIS — Z20822 Contact with and (suspected) exposure to covid-19: Secondary | ICD-10-CM | POA: Insufficient documentation

## 2020-11-28 DIAGNOSIS — K219 Gastro-esophageal reflux disease without esophagitis: Secondary | ICD-10-CM | POA: Diagnosis present

## 2020-11-28 DIAGNOSIS — J42 Unspecified chronic bronchitis: Secondary | ICD-10-CM | POA: Diagnosis not present

## 2020-11-28 DIAGNOSIS — N183 Chronic kidney disease, stage 3 unspecified: Secondary | ICD-10-CM | POA: Insufficient documentation

## 2020-11-28 DIAGNOSIS — Z96652 Presence of left artificial knee joint: Secondary | ICD-10-CM | POA: Insufficient documentation

## 2020-11-28 DIAGNOSIS — R55 Syncope and collapse: Secondary | ICD-10-CM | POA: Diagnosis not present

## 2020-11-28 DIAGNOSIS — Z79899 Other long term (current) drug therapy: Secondary | ICD-10-CM | POA: Diagnosis not present

## 2020-11-28 DIAGNOSIS — S91002S Unspecified open wound, left ankle, sequela: Secondary | ICD-10-CM | POA: Diagnosis not present

## 2020-11-28 DIAGNOSIS — E669 Obesity, unspecified: Secondary | ICD-10-CM | POA: Diagnosis not present

## 2020-11-28 DIAGNOSIS — R1110 Vomiting, unspecified: Secondary | ICD-10-CM | POA: Diagnosis not present

## 2020-11-28 DIAGNOSIS — E1122 Type 2 diabetes mellitus with diabetic chronic kidney disease: Secondary | ICD-10-CM | POA: Diagnosis not present

## 2020-11-28 DIAGNOSIS — M1990 Unspecified osteoarthritis, unspecified site: Secondary | ICD-10-CM

## 2020-11-28 DIAGNOSIS — R4182 Altered mental status, unspecified: Secondary | ICD-10-CM | POA: Diagnosis not present

## 2020-11-28 DIAGNOSIS — R112 Nausea with vomiting, unspecified: Secondary | ICD-10-CM | POA: Diagnosis present

## 2020-11-28 DIAGNOSIS — R1111 Vomiting without nausea: Secondary | ICD-10-CM | POA: Diagnosis not present

## 2020-11-28 DIAGNOSIS — R52 Pain, unspecified: Secondary | ICD-10-CM | POA: Diagnosis not present

## 2020-11-28 LAB — URINALYSIS, ROUTINE W REFLEX MICROSCOPIC
Bacteria, UA: NONE SEEN
Bilirubin Urine: NEGATIVE
Glucose, UA: >=500 mg/dL — AB
Hgb urine dipstick: NEGATIVE
Ketones, ur: 5 mg/dL — AB
Leukocytes,Ua: NEGATIVE
Nitrite: NEGATIVE
Protein, ur: NEGATIVE mg/dL
Specific Gravity, Urine: 1.028 (ref 1.005–1.030)
pH: 5 (ref 5.0–8.0)

## 2020-11-28 LAB — COMPREHENSIVE METABOLIC PANEL
ALT: 21 U/L (ref 0–44)
AST: 21 U/L (ref 15–41)
Albumin: 3.9 g/dL (ref 3.5–5.0)
Alkaline Phosphatase: 82 U/L (ref 38–126)
Anion gap: 9 (ref 5–15)
BUN: 18 mg/dL (ref 8–23)
CO2: 23 mmol/L (ref 22–32)
Calcium: 9.1 mg/dL (ref 8.9–10.3)
Chloride: 103 mmol/L (ref 98–111)
Creatinine, Ser: 1.16 mg/dL (ref 0.61–1.24)
GFR, Estimated: >60 mL/min (ref >60)
Glucose, Bld: 150 mg/dL — ABNORMAL HIGH (ref 70–99)
Potassium: 4.2 mmol/L (ref 3.5–5.1)
Sodium: 135 mmol/L (ref 135–145)
Total Bilirubin: 0.5 mg/dL (ref 0.3–1.2)
Total Protein: 8.4 g/dL — ABNORMAL HIGH (ref 6.5–8.1)

## 2020-11-28 LAB — CBC WITH DIFFERENTIAL/PLATELET
Abs Immature Granulocytes: 0.03 10*3/uL (ref 0.00–0.07)
Basophils Absolute: 0 10*3/uL (ref 0.0–0.1)
Basophils Relative: 1 %
Eosinophils Absolute: 0.1 10*3/uL (ref 0.0–0.5)
Eosinophils Relative: 2 %
HCT: 43.7 % (ref 39.0–52.0)
Hemoglobin: 12.8 g/dL — ABNORMAL LOW (ref 13.0–17.0)
Immature Granulocytes: 1 %
Lymphocytes Relative: 11 %
Lymphs Abs: 0.7 10*3/uL (ref 0.7–4.0)
MCH: 26.3 pg (ref 26.0–34.0)
MCHC: 29.3 g/dL — ABNORMAL LOW (ref 30.0–36.0)
MCV: 89.9 fL (ref 80.0–100.0)
Monocytes Absolute: 0.3 10*3/uL (ref 0.1–1.0)
Monocytes Relative: 5 %
Neutro Abs: 5.1 10*3/uL (ref 1.7–7.7)
Neutrophils Relative %: 80 %
Platelets: 245 10*3/uL (ref 150–400)
RBC: 4.86 MIL/uL (ref 4.22–5.81)
RDW: 18.4 % — ABNORMAL HIGH (ref 11.5–15.5)
WBC: 6.3 10*3/uL (ref 4.0–10.5)
nRBC: 0 % (ref 0.0–0.2)

## 2020-11-28 LAB — LIPASE, BLOOD: Lipase: 29 U/L (ref 11–51)

## 2020-11-28 LAB — RESP PANEL BY RT-PCR (FLU A&B, COVID) ARPGX2
Influenza A by PCR: NEGATIVE
Influenza B by PCR: NEGATIVE
SARS Coronavirus 2 by RT PCR: NEGATIVE

## 2020-11-28 LAB — TROPONIN I (HIGH SENSITIVITY): Troponin I (High Sensitivity): 3 ng/L (ref <18)

## 2020-11-28 LAB — CBG MONITORING, ED: Glucose-Capillary: 144 mg/dL — ABNORMAL HIGH (ref 70–99)

## 2020-11-28 IMAGING — CT CT HEAD W/O CM
3 series · 16 of 47 positions shown, 19 images · non-contrast
Comparison: None.

CLINICAL DATA: Mental status changes.

EXAM:
CT HEAD WITHOUT CONTRAST
TECHNIQUE: Contiguous axial images were obtained from the base of the skull
through the vertex without intravenous contrast.

[Series 2: head w o · axial · 0.47mm/px · z∈[+8,+133]mm · 10 of 31 slices shown, 13 images]
[im 3/31  brain]
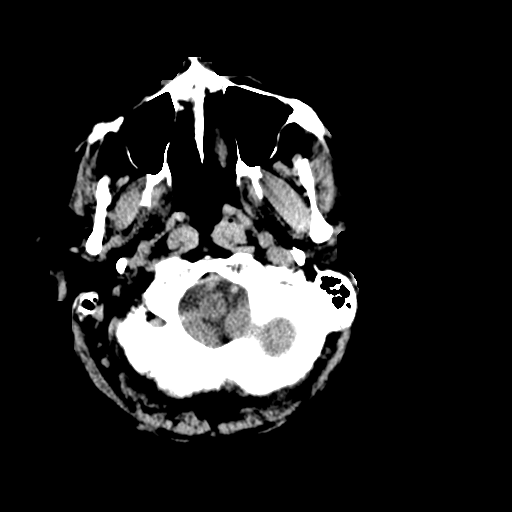
[im 3/31  bone]
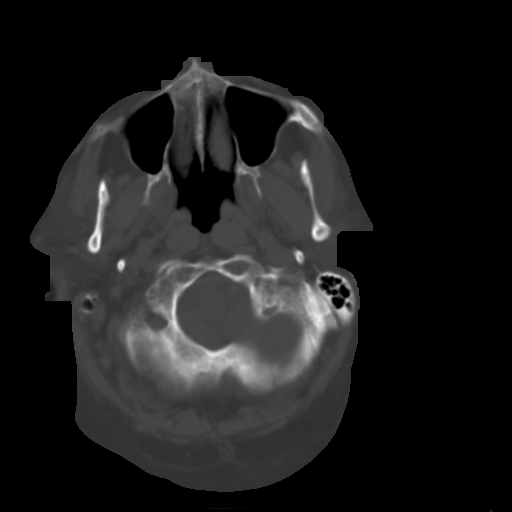
[im 6/31  brain]
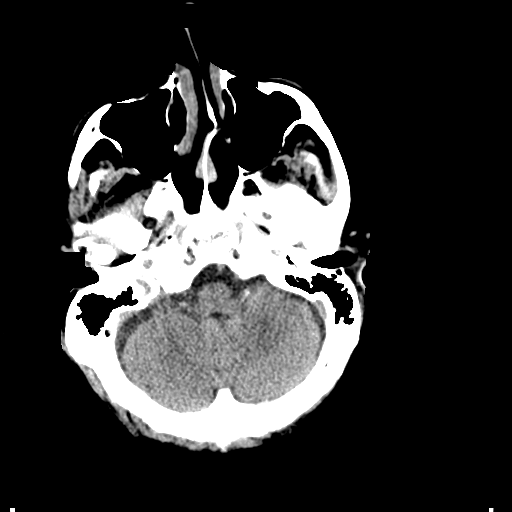
[im 9/31  brain]
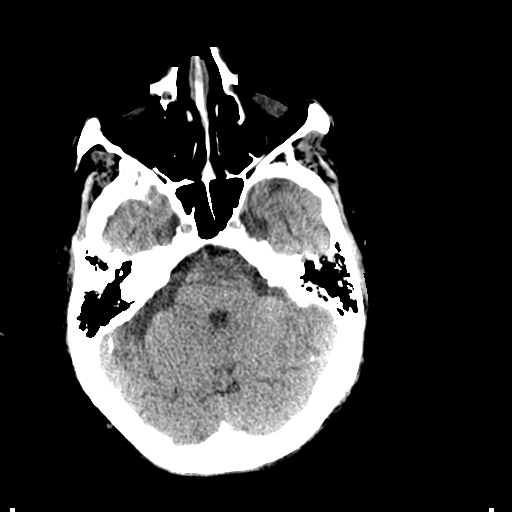
[im 11/31  brain]
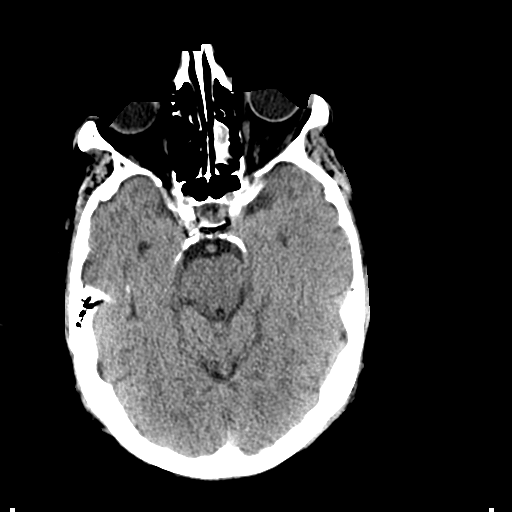
[im 14/31  brain]
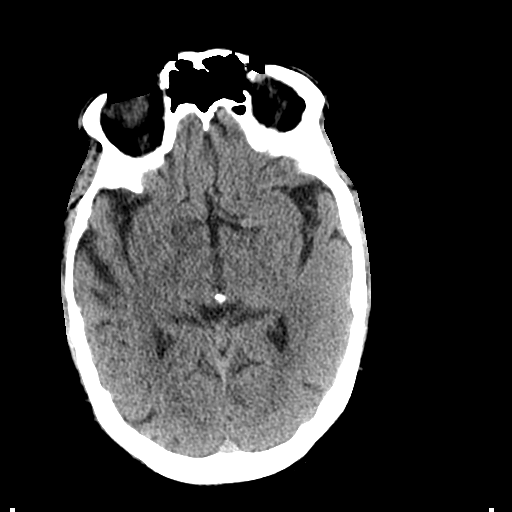
[im 14/31  bone]
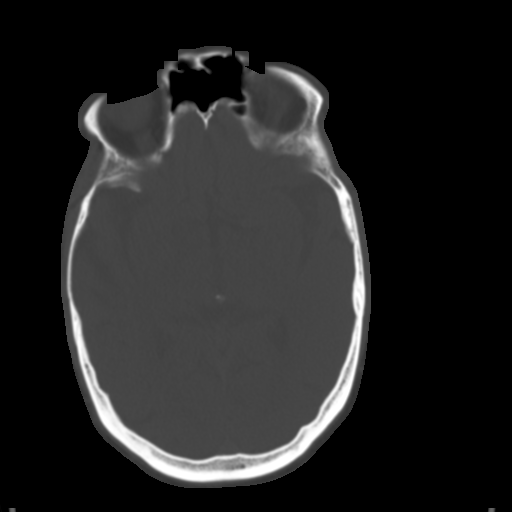
[im 17/31  brain]
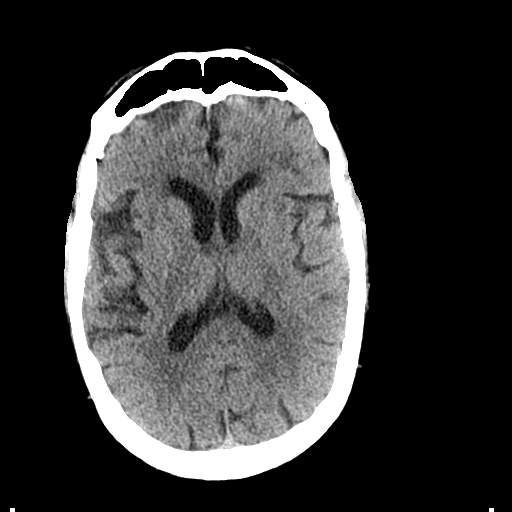
[im 20/31  brain]
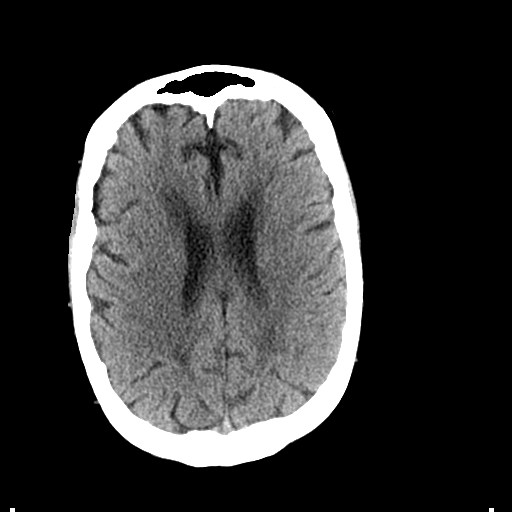
[im 23/31  brain]
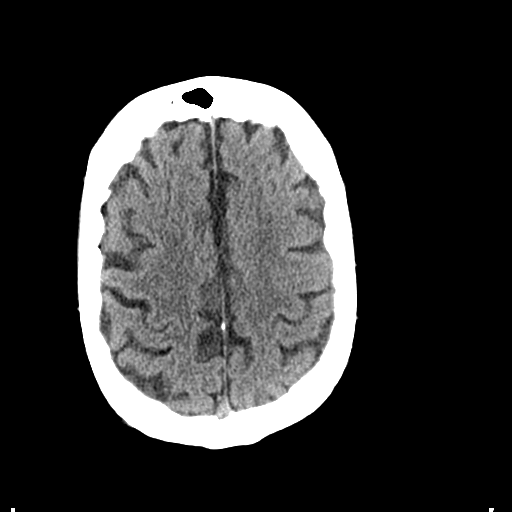
[im 25/31  brain]
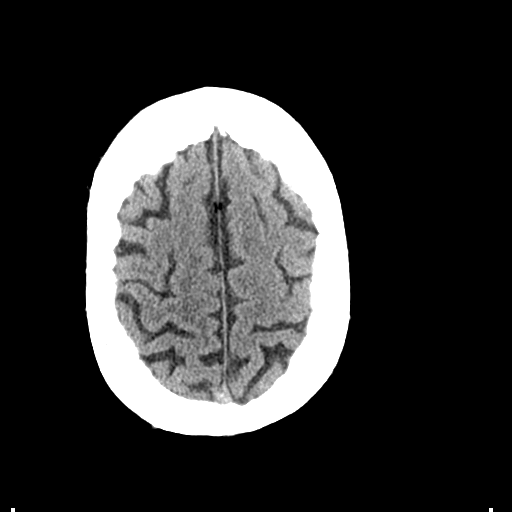
[im 25/31  bone]
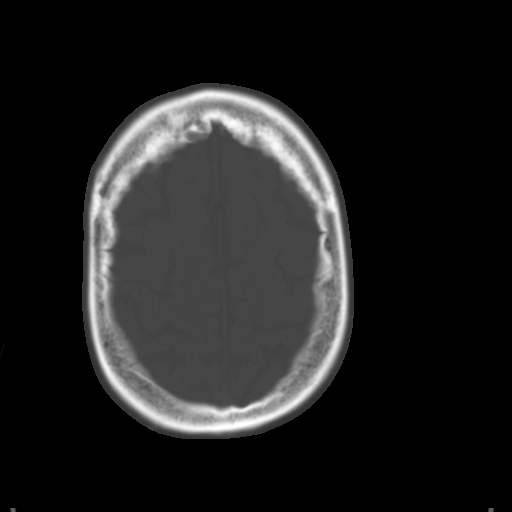
[im 28/31  brain]
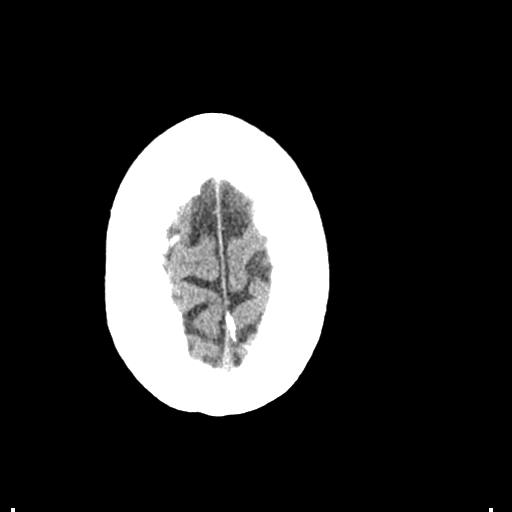

[Series 4: coronal soft · coronal · 0.34mm/px · 3 of 73 slices shown]
[im 25/73  brain]
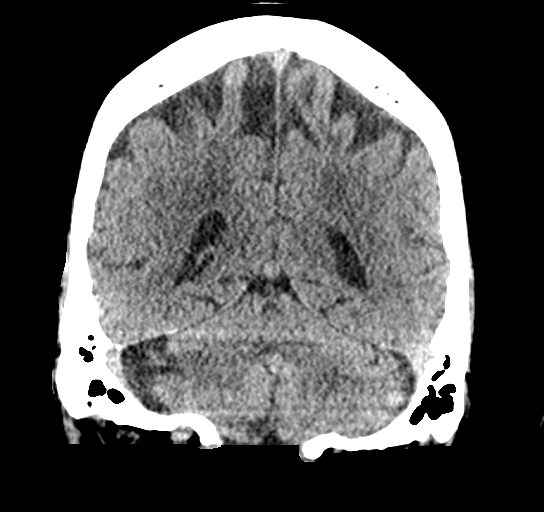
[im 33/73  brain]
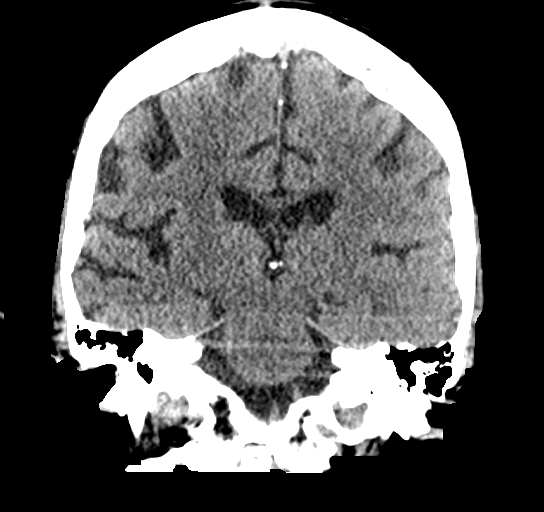
[im 41/73  brain]
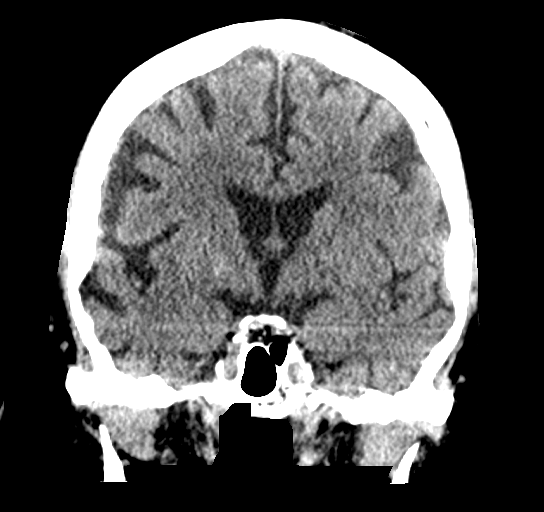

[Series 5: sagittal soft · sagittal · 0.33mm/px · 3 of 53 slices shown]
[im 18/53  brain]
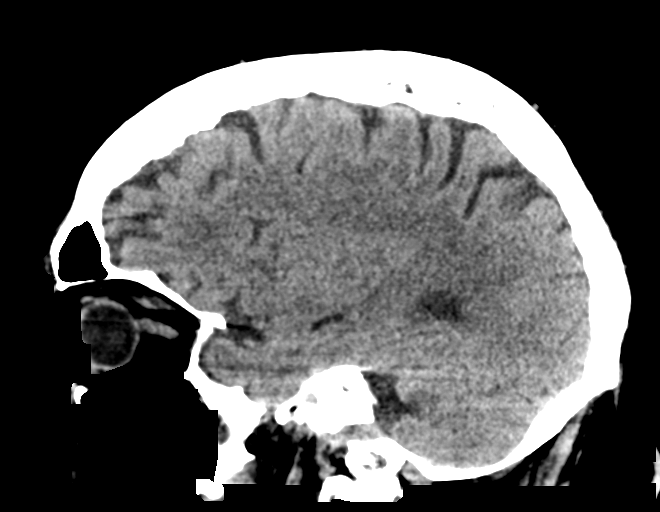
[im 27/53  brain]
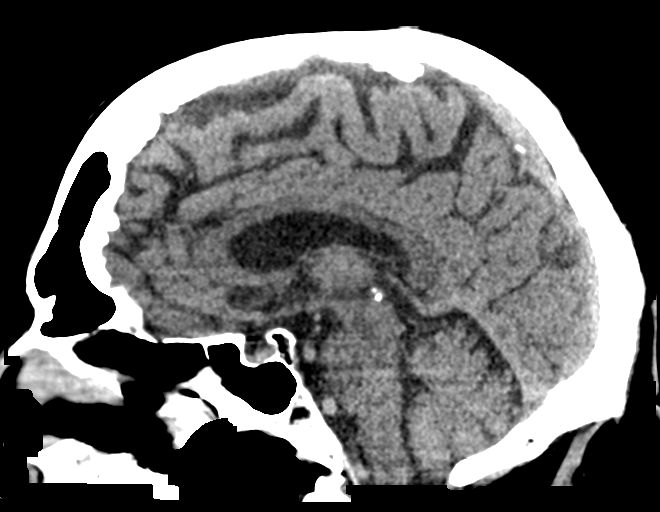
[im 35/53  brain]
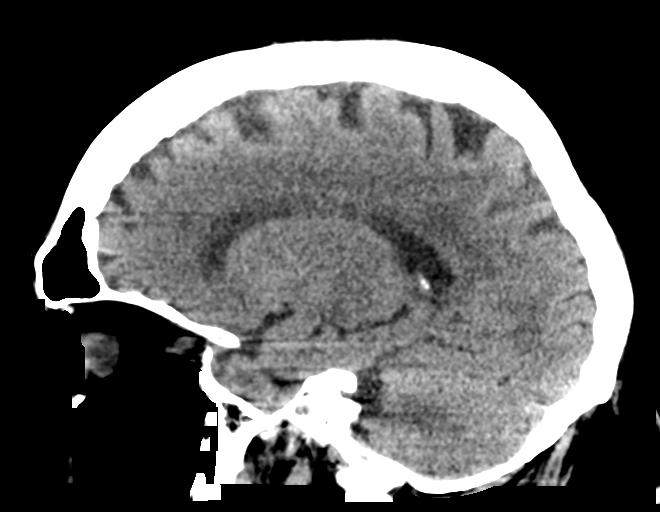

[16 of 47 positions shown; findings below may reference images not displayed]

FINDINGS: Brain: Age related cerebral atrophy, ventriculomegaly and
periventricular white matter disease. No extra-axial fluid
collections are identified. No CT findings for acute hemispheric
infarction or intracranial hemorrhage. No mass lesions. The
brainstem and cerebellum are normal.

Vascular: Vascular calcifications but no aneurysm or hyperdense
vessels.

Skull: No skull fracture or bone lesions.

Sinuses/Orbits: The paranasal sinuses and mastoid air cells are
clear. The globes are intact.

Other: No scalp lesions or scalp hematoma.
IMPRESSION: 1. Age related cerebral atrophy, ventriculomegaly and
periventricular white matter disease.
2. No acute intracranial findings or mass lesions.

## 2020-11-28 IMAGING — DX DG ABDOMEN ACUTE W/ 1V CHEST
4 series · 4 of 4 positions shown · non-contrast
Comparison: [DATE]

CLINICAL DATA: Vomiting and diarrhea, syncope episdode last
nightvomiting and diarrhea

EXAM:
DG ABDOMEN ACUTE WITH 1 VIEW CHEST

[chest ap grid]
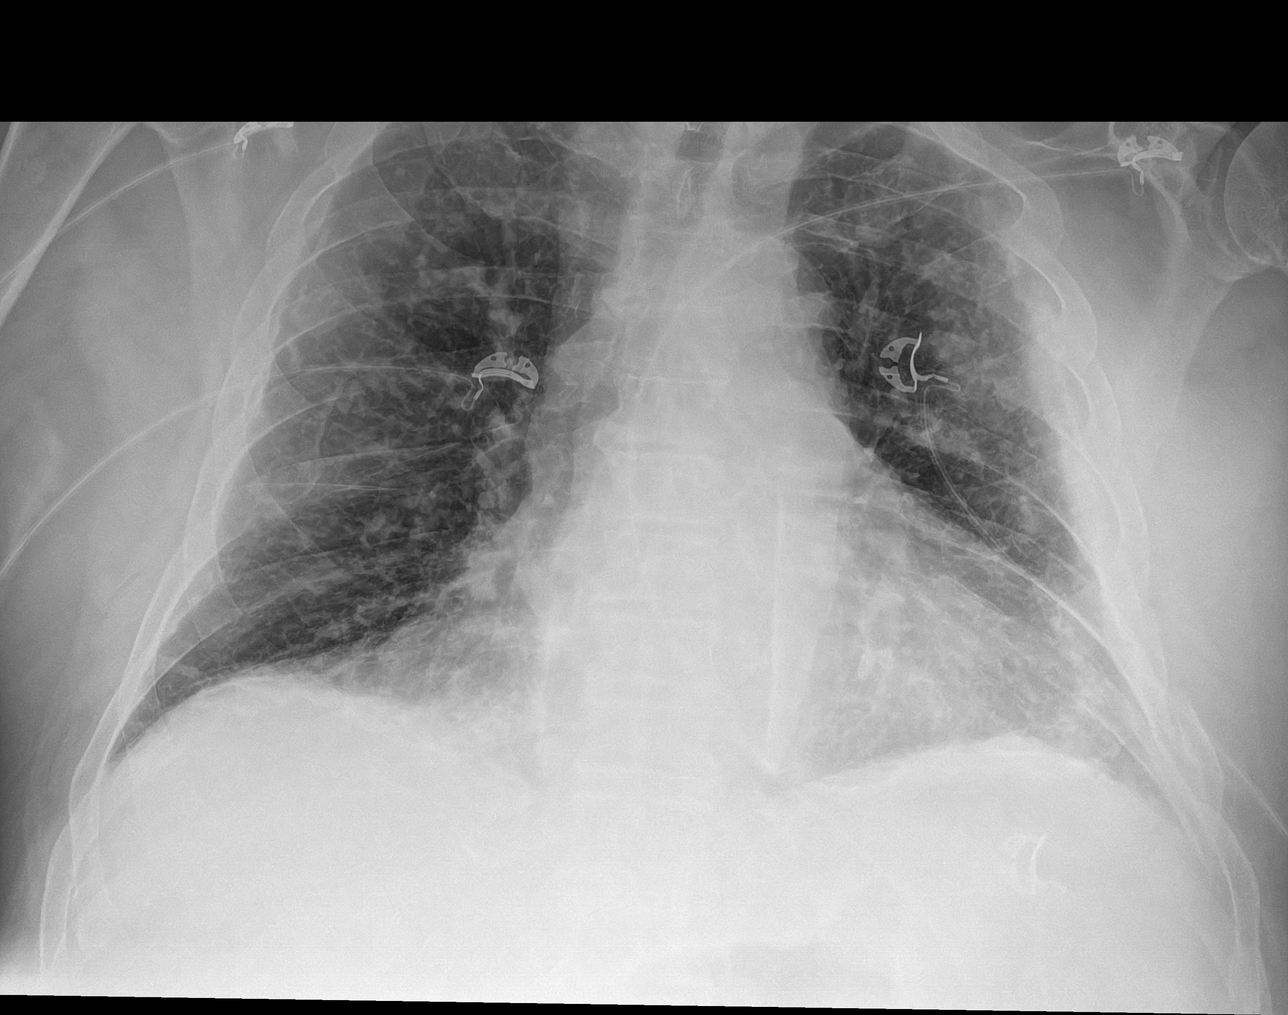

[abdomen erect grid]
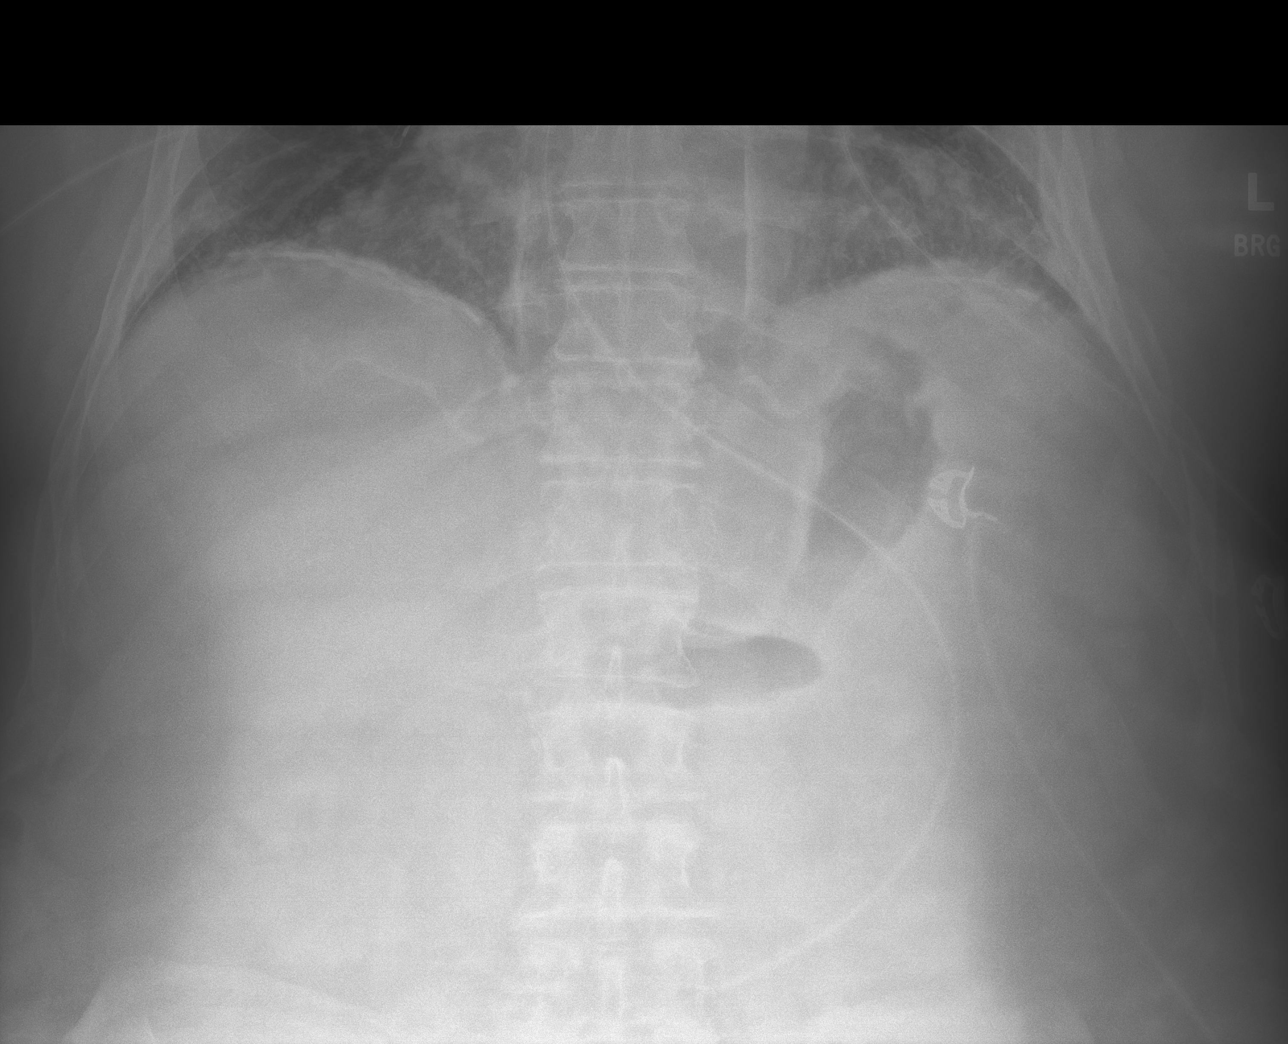

[abdomen supine grid (1 of 2)]
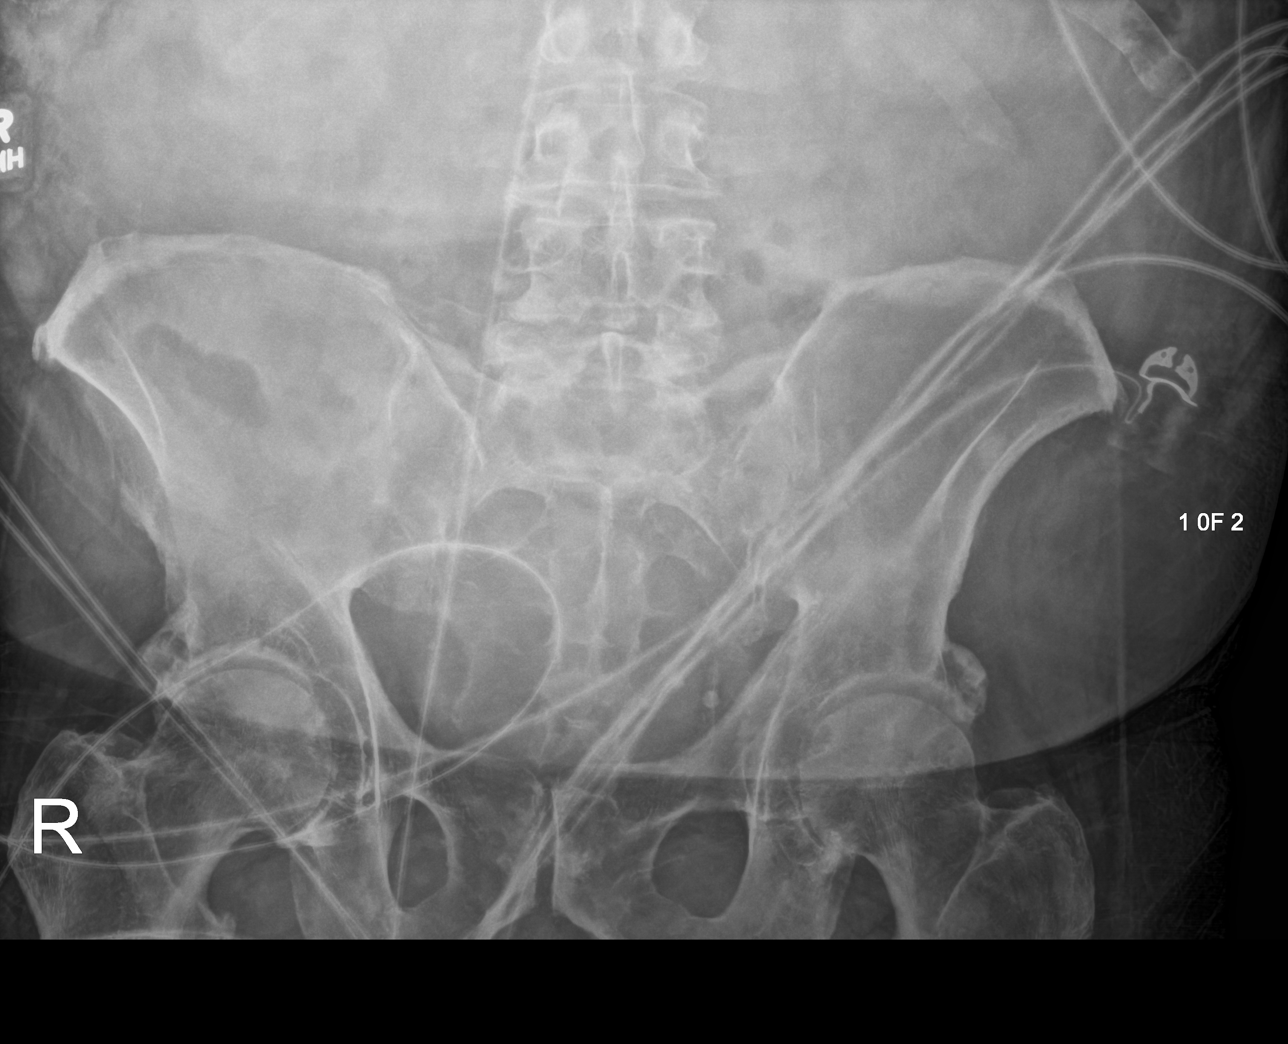

[abdomen supine grid (2 of 2)]
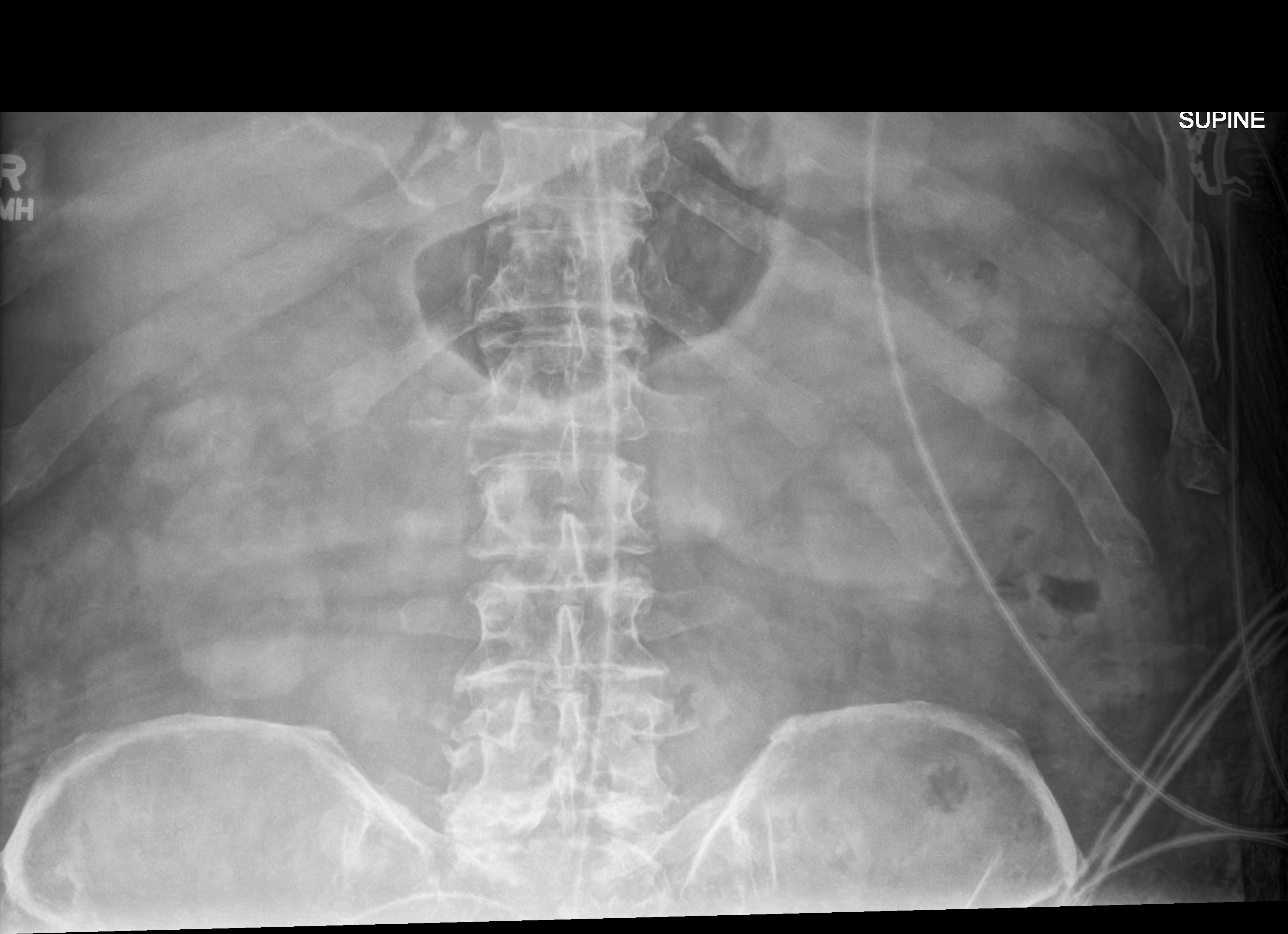

[4 of 4 positions shown; findings below may reference images not displayed]

FINDINGS: Normal mediastinum and cardiac silhouette. Chronic central
bronchitic markings. Normal pulmonary vasculature. No effusion,
infiltrate, or pneumothorax.

No dilated loops of large or small bowel. Gas and stool in the
rectum. No pathologic calcifications. No organomegaly. No acute
osseous abnormality

Degenerate spurring of the imaged joints
IMPRESSION: 1. No acute cardiopulmonary findings.
2. No acute abdominal

## 2020-11-28 MED ORDER — ACETAMINOPHEN 325 MG PO TABS: 650.0000 mg | ORAL_TABLET | Freq: Four times a day (QID) | ORAL | Status: DC | PRN

## 2020-11-28 MED ORDER — ONDANSETRON HCL 4 MG/2ML IJ SOLN: 4.0000 mg | Freq: Four times a day (QID) | INTRAMUSCULAR | Status: DC | PRN

## 2020-11-28 MED ORDER — SODIUM CHLORIDE 0.9 % IV BOLUS
1000.0000 mL | Freq: Once | INTRAVENOUS | Status: AC
  Administered 2020-11-28: 1000 mL via INTRAVENOUS

## 2020-11-28 MED ORDER — SODIUM CHLORIDE 0.9 % IV SOLN: INTRAVENOUS | Status: DC

## 2020-11-28 MED ORDER — ENOXAPARIN SODIUM 80 MG/0.8ML IJ SOSY
68.0000 mg | PREFILLED_SYRINGE | INTRAMUSCULAR | Status: DC
  Administered 2020-11-28 – 2020-11-29 (×2): 68 mg via SUBCUTANEOUS
  Filled 2020-11-28 (×2): qty 0.8

## 2020-11-28 MED ORDER — ONDANSETRON HCL 4 MG/2ML IJ SOLN
4.0000 mg | Freq: Once | INTRAMUSCULAR | Status: AC
  Administered 2020-11-28: 4 mg via INTRAVENOUS
  Filled 2020-11-28: qty 2

## 2020-11-28 MED ORDER — PRAVASTATIN SODIUM 40 MG PO TABS
80.0000 mg | ORAL_TABLET | Freq: Every day | ORAL | Status: DC
  Administered 2020-11-29: 80 mg via ORAL
  Filled 2020-11-28: qty 2

## 2020-11-28 MED ORDER — ENOXAPARIN SODIUM 40 MG/0.4ML IJ SOSY: 40.0000 mg | PREFILLED_SYRINGE | INTRAMUSCULAR | Status: DC

## 2020-11-28 MED ORDER — TAMSULOSIN HCL 0.4 MG PO CAPS
0.4000 mg | ORAL_CAPSULE | Freq: Every day | ORAL | Status: DC
  Administered 2020-11-29: 0.4 mg via ORAL
  Filled 2020-11-28: qty 1

## 2020-11-28 NOTE — ED Notes (Signed)
Pt daughter took pt belongings with her except for his ring.

## 2020-11-28 NOTE — ED Notes (Signed)
Redressed foot dressing after DR evaluated wound. This was a pressure area about the size of a quarter that the surgeon is aware of. Wound and foot were not warm to the touch. Follow up MRI discussed.

## 2020-11-28 NOTE — ED Notes (Signed)
Pt placed on cardiac monitor with BP to set cycle every 30 minutes. Continuous pulse oximeter applied.  

## 2020-11-28 NOTE — H&P (Signed)
History and Physical  Klein Willcox [facility] WJX:[id number] DOB: May 30, 1932 DOA: 11/28/2020  Referring physician: Linwood Dibbles, MD PCP: Lupita Raider, MD  Patient coming from: Home  Chief Complaint: Loss of consciousness  HPI: Angel Norman is a 87 y.o. male with medical history significant for  type 2 diabetes mellitus, GERD, dyslipidemia and osteoarthritis who presents to the emergency department with a syncopal episode at home.  Patient was unable to provide history, history was obtained from daughter at bedside and granddaughter by phone, per report, patient lives at home with wife who has Alzheimer's disease, so daughter and her 3 daughters (granddaughters to patient) usually check on patient on daily basis.  Today, one of the granddaughters was with patient to help with shower and shaving.  Patient was noted to suddenly slump his head backwards with eyes rolling and unconscious for about 2 minutes during which he had a urinary incontinence, and was confused for about 10 minutes (about the time it took for EMS to arrive) prior to returning to his baseline mental status.  He was also reported to have a loose bowel movement after regaining consciousness and this was associated with diaphoresis, nausea and dry heaves.  He denies headache, fever, chills, chest pain or shortness of breath.  ED Course:  In the emergency department, he was hemodynamically stable.  Work-up in the ED showed normocytic anemia, normal BMP except for hyperglycemia, influenza A, B, SARS coronavirus 2 was negative. Abdominal x-ray with 1 view chest showed no acute cardiopulmonary findings and no acute abdominal findings. CT of head without contrast showed no acute intracranial findings or mass lesions IV Zofran was given, IV hydration was provided.  Hospitalist was asked to admit patient for further evaluation and management.  Review of Systems: Constitutional: Negative for chills and fever.  HENT: Negative for ear pain and sore  throat.   Eyes: Negative for pain and visual disturbance.  Respiratory: Negative for cough, chest tightness and shortness of breath.   Cardiovascular: Negative for chest pain and palpitations.  Gastrointestinal: Positive for nausea, vomiting.  Negative for abdominal pain  Endocrine: Negative for polyphagia and polyuria.  Genitourinary: Negative for decreased urine volume, dysuria, enuresis Musculoskeletal: Negative for arthralgias and back pain.  Skin: Negative for color change and rash.  Allergic/Immunologic: Negative for immunocompromised state.  Neurological: Negative for tremors, syncope, speech difficulty, weakness, light-headedness and headaches.  Hematological: Does not bruise/bleed easily.  All other systems reviewed and are negative   Past Medical History:  Diagnosis Date   Arthritis    GERD (gastroesophageal reflux disease)    Hyperlipidemia    Rib fractures 12/06/2014   Past Surgical History:  Procedure Laterality Date   ESOPHAGOGASTRODUODENOSCOPY (EGD) WITH PROPOFOL N/A 05/08/2018   Procedure: ESOPHAGOGASTRODUODENOSCOPY (EGD) WITH PROPOFOL;  Surgeon: Malissa Hippo, MD;  Location: [facility]ITE;  Service: Endoscopy;  Laterality: N/A;   goiter removal     ORIF ANKLE FRACTURE Left 09/13/2020   Procedure: OPEN REDUCTION INTERNAL FIXATION (ORIF) LEFT ANKLE FRACTURE;  Surgeon: Vickki Hearing, MD;  Location: [facility] ORS;  Service: Orthopedics;  Laterality: Left;   TOTAL KNEE ARTHROPLASTY Left 08/18/2012   Dr Lajoyce Corners   TOTAL KNEE ARTHROPLASTY Left 08/19/2012   Procedure: TOTAL KNEE ARTHROPLASTY;  Surgeon: Nadara Mustard, MD;  Location: [facility] OR;  Service: Orthopedics;  Laterality: Left;  Left Total Knee Arthroplasty    Social History:  reports that he has never smoked. He has never used smokeless tobacco. He reports that he does not drink  alcohol and does not use drugs.   No Known Allergies  Family History  Family history unknown: Yes     Prior to Admission medications    Medication Sig Start Date End Date Taking? Authorizing Provider  acetaminophen (TYLENOL) 325 MG tablet Take 2 tablets (650 mg total) by mouth every 6 (six) hours. 09/15/20  Yes Johnson, Clanford L, MD  JARDIANCE 25 MG TABS tablet Take 25 mg by mouth every morning. 07/11/20  Yes [provider]  metFORMIN (GLUCOPHAGE) 1000 MG tablet Take 1,000 mg by mouth 2 (two) times daily. 04/23/20  Yes [provider]  OVER THE COUNTER MEDICATION Take 1 tablet by mouth daily. Cera-Vite   Yes [provider]  pravastatin (PRAVACHOL) 80 MG tablet Take 80 mg by mouth daily.   Yes [provider]  sulfamethoxazole-trimethoprim (BACTRIM DS) 800-160 MG tablet Take 1 tablet by mouth 2 (two) times daily. 11/08/20  Yes Vickki Hearing, MD  tamsulosin (FLOMAX) 0.4 MG CAPS capsule Take 0.4 mg by mouth daily.   Yes [provider]  traMADol (ULTRAM) 50 MG tablet Take 1 tablet (50 mg total) by mouth every 6 (six) hours as needed. 11/08/20  Yes Vickki Hearing, MD  aspirin EC 81 MG tablet Take 81 mg by mouth daily as needed for mild pain. Swallow whole. Patient not taking: No sig reported    [provider]  pantoprazole (PROTONIX) 40 MG tablet Take 1 tablet (40 mg total) by mouth daily. Needs to use twice a day for 12 weeks for gastritis Patient not taking: No sig reported 09/15/20 09/15/21  Johnson, Clanford L, MD  polyethylene glycol (MIRALAX / GLYCOLAX) 17 g packet Take 17 g by mouth daily. Patient not taking: No sig reported 09/16/20   Cleora Fleet, MD    Physical Exam: BP 138/81   Pulse 96   Temp (!) 97.5 F (36.4 C) (Oral)   Resp 17   Ht 6\' 3"  (1.905 m)   Wt 136.1 kg   SpO2 94%   BMI 37.50 kg/m   General: 87 y.o. year-old male well developed well nourished in no acute distress.  Alert and oriented x3. HEENT: NCAT, EOMI Neck: Supple, trachea medial Cardiovascular: Regular rate and rhythm with no rubs or gallops.  No thyromegaly or JVD noted.  No  lower extremity edema. 2/4 pulses in all 4 extremities. Respiratory: Clear to auscultation with no wheezes or rales. Good inspiratory effort. Abdomen: Soft, nontender nondistended with normal bowel sounds x4 quadrants. Muskuloskeletal: No cyanosis, clubbing or edema noted bilaterally Neuro: CN II-XII intact, strength 5/5 x 4, sensation, reflexes intact Skin: No ulcerative lesions noted or rashes Psychiatry: Judgement and insight appear normal. Mood is appropriate for condition and setting          Labs on Admission:  Basic Metabolic Panel: Recent Labs  Lab 11/28/20 1605  NA 135  K 4.2  CL 103  CO2 23  GLUCOSE 150*  BUN 18  CREATININE 1.16  CALCIUM 9.1   Liver Function Tests: Recent Labs  Lab 11/28/20 1605  AST 21  ALT 21  ALKPHOS 82  BILITOT 0.5  PROT 8.4*  ALBUMIN 3.9   Recent Labs  Lab 11/28/20 1605  LIPASE 29   No results for input(s): AMMONIA in the last 168 hours. CBC: Recent Labs  Lab 11/28/20 1605  WBC 6.3  NEUTROABS 5.1  HGB 12.8*  HCT 43.7  MCV 89.9  PLT 245   Cardiac Enzymes: No results for input(s): CKTOTAL,  CKMB, CKMBINDEX, TROPONINI in the last 168 hours.  BNP (last 3 results) No results for input(s): BNP in the last 8760 hours.  ProBNP (last 3 results) No results for input(s): PROBNP in the last 8760 hours.  CBG: Recent Labs  Lab 11/28/20 1545  GLUCAP 144*    Radiological Exams on Admission: CT HEAD WO CONTRAST ( )  Result Date: 11/28/2020 CLINICAL DATA:  Mental status changes. EXAM: CT HEAD WITHOUT CONTRAST TECHNIQUE: Contiguous axial images were obtained from the base of the skull through the vertex without intravenous contrast. COMPARISON:  None. FINDINGS: Brain: Age related cerebral atrophy, ventriculomegaly and periventricular white matter disease. No extra-axial fluid collections are identified. No CT findings for acute hemispheric infarction or intracranial hemorrhage. No mass lesions. The brainstem and cerebellum are  normal. Vascular: Vascular calcifications but no aneurysm or hyperdense vessels. Skull: No skull fracture or bone lesions. Sinuses/Orbits: The paranasal sinuses and mastoid air cells are clear. The globes are intact. Other: No scalp lesions or scalp hematoma. IMPRESSION: 1. Age related cerebral atrophy, ventriculomegaly and periventricular white matter disease. 2. No acute intracranial findings or mass lesions. Electronically Signed   By: Rudie Meyer M.D.   On: 11/28/2020 16:53   DG Abd Acute W/Chest  Result Date: 11/28/2020 CLINICAL DATA:  Vomiting and diarrhea, syncope episdode last nightvomiting and diarrhea EXAM: DG ABDOMEN ACUTE WITH 1 VIEW CHEST COMPARISON:  07/07/2018 FINDINGS: Normal mediastinum and cardiac silhouette. Chronic central bronchitic markings. Normal pulmonary vasculature. No effusion, infiltrate, or pneumothorax. No dilated loops of large or small bowel. Gas and stool in the rectum. No pathologic calcifications. No organomegaly. No acute osseous abnormality Degenerate spurring of the imaged joints IMPRESSION: 1. No acute cardiopulmonary findings. 2. No acute abdominal Electronically Signed   By: Genevive Bi M.D.   On: 11/28/2020 18:05    EKG: I independently viewed the EKG done and my findings are as followed: Normal sinus rhythm at rate of 85 bpm with APCs and increased PR and LBBB (similar to EKG done on 09/12/2020)  Assessment/Plan Present on Admission:  Syncope  GERD (gastroesophageal reflux disease)  Principal Problem:   Syncope Active Problems:   GERD (gastroesophageal reflux disease)   Nausea & vomiting   Obesity (BMI 30-39.9)   Diarrhea   Hyperglycemia due to diabetes mellitus (HCC)   Mixed hyperlipidemia   Ankle wound, left, sequela   Osteoarthritis  Syncopal episode, rule out seizures Continue telemetry and watch for arrhythmias Troponins pending; continue to trend troponin EKG showed Normal sinus rhythm at a rate of 85 bpm with APCs and increased PR  and LBBB (similar to EKG done on 09/12/2020) Echocardiogram done on LVEF of 55 to 60%.  LV as no RWMA.  LV diastolic parameters are consistent with G1 DD.  E Echocardiogram will be done to rule out significant aortic stenosis or other outflow obstruction, and also to evaluate EF and to rule out segmental/Regional wall motion abnormalities.  Carotid artery Dopplers will be done to rule out hemodynamically significant stenosis EEG will be done in the morning  Nausea and vomiting Patient complained more of dry heaves rather than vomiting Continue Zofran as needed  ?? Diarrhea Patient had only 1 episode PTA, he has not had any diarrheal episodes since arrival to the ED  Hyperglycemia secondary to type 2 diabetes mellitus Continue ISS and hypoglycemic protocol Metformin and Jardiance will be held at this time  Left ankle wound Continue wound care Patient was scheduled to have MRI of left ankle as an outpatient,  this will be done in the morning  Mixed hyperlipidemia Continue Pravachol  BPH Continue Flomax  Osteoarthritis Continue Tylenol as needed   DVT prophylaxis: Lovenox  Code Status: Full code  Family Communication: Daughter at bedside, granddaughter by phone (all questions answered to satisfaction)  Disposition Plan:  Patient is from:                        home Anticipated DC to:                   SNF or family members home Anticipated DC date:               2-3 days Anticipated DC barriers:        Patient requires inpatient management for syncope work-up    Consults called: None  Admission status: Observation    Frankey Shown MD [facility]  11/28/2020, 7:40 PM

## 2020-11-28 NOTE — Telephone Encounter (Signed)
Refill request received via fax for  Clindamycin, but I am seeing he is on Bactrim DS

## 2020-11-28 NOTE — ED Notes (Signed)
ED Provider at bedside. 

## 2020-11-28 NOTE — ED Triage Notes (Signed)
Pt had syncopal episode lasting 2-3 minutes. Has N/V that started today.

## 2020-11-28 NOTE — ED Provider Notes (Signed)
[facility] EMERGENCY DEPARTMENT Provider Note   CSN: [id number] Arrival date & time: 11/28/20  1506     History Chief Complaint  Patient presents with   Loss of Consciousness    N/V/D, headache, syncope    Angel Norman is a 87 y.o. male.  HPI  Patient presents to the ED for evaluation of nausea vomiting and diarrhea.  Patient states he started having nausea and vomiting today.  He has had multiple episodes as well as multiple episodes of diarrhea.  Patient has not been able to keep anything down.  He also had a syncopal episode today lasting for couple of minutes.  Patient denies injuring himself.  Denies any abdominal pain.  He does have a headache.  He thinks it started before he had a syncopal episode.  He denies any chest pain or shortness of breath.  No known fevers or chills.  Patient injured his ankle and had open reduction internal fixation on July 13.  This has been complicated by wound infection.   He has been on antibiotics.  Past Medical History:  Diagnosis Date   Arthritis    GERD (gastroesophageal reflux disease)    Hyperlipidemia    Rib fractures 12/06/2014    Patient Active Problem List   Diagnosis Date Noted   Cellulitis of left ankle 10/16/2020   CKD (chronic kidney disease) stage 3, GFR 30-59 ml/min (HCC) 09/18/2020   Acute respiratory failure with hypoxia (HCC) 09/16/2020   Closed fracture of left ankle 09/12/2020   UGIB (upper gastrointestinal bleed) 05/09/2018   Anemia due to acute blood loss 05/09/2018   Obesity, Class III, BMI 40-49.9 (morbid obesity) (HCC) 05/09/2018   Acute lower UTI 05/09/2018   GERD (gastroesophageal reflux disease) 05/08/2018   AKI (acute kidney injury) (HCC) 05/07/2018   Blunt chest trauma 12/07/2014   Ileus (HCC) 12/07/2014   Fracture of multiple ribs of left side 12/06/2014    Past Surgical History:  Procedure Laterality Date   ESOPHAGOGASTRODUODENOSCOPY (EGD) WITH PROPOFOL N/A 05/08/2018   Procedure:  ESOPHAGOGASTRODUODENOSCOPY (EGD) WITH PROPOFOL;  Surgeon: Malissa Hippo, MD;  Location: [facility] ENDO SUITE;  Service: Endoscopy;  Laterality: N/A;   goiter removal     ORIF ANKLE FRACTURE Left 09/13/2020   Procedure: OPEN REDUCTION INTERNAL FIXATION (ORIF) LEFT ANKLE FRACTURE;  Surgeon: Vickki Hearing, MD;  Location: [facility] ORS;  Service: Orthopedics;  Laterality: Left;   TOTAL KNEE ARTHROPLASTY Left 08/18/2012   Dr Lajoyce Corners   TOTAL KNEE ARTHROPLASTY Left 08/19/2012   Procedure: TOTAL KNEE ARTHROPLASTY;  Surgeon: Nadara Mustard, MD;  Location: [facility] OR;  Service: Orthopedics;  Laterality: Left;  Left Total Knee Arthroplasty       Family History  Family history unknown: Yes    Social History   Tobacco Use   Smoking status: Never   Smokeless tobacco: Never  Vaping Use   Vaping Use: Never used  Substance Use Topics   Alcohol use: No   Drug use: No    Home Medications Prior to Admission medications   Medication Sig Start Date End Date Taking? Authorizing Provider  acetaminophen (TYLENOL) 325 MG tablet Take 2 tablets (650 mg total) by mouth every 6 (six) hours. 09/15/20   Cleora Fleet, MD  aspirin EC 81 MG tablet Take 81 mg by mouth daily as needed for mild pain. Swallow whole.    [provider]  JARDIANCE 25 MG TABS tablet Take 25 mg by mouth every morning. 07/11/20   [provider]  metFORMIN (GLUCOPHAGE) 1000 MG tablet Take 1,000 mg by mouth 2 (two) times daily. 04/23/20   [provider]  pantoprazole (PROTONIX) 40 MG tablet Take 1 tablet (40 mg total) by mouth daily. Needs to use twice a day for 12 weeks for gastritis 09/15/20 09/15/21  Johnson, Clanford L, MD  polyethylene glycol (MIRALAX / GLYCOLAX) 17 g packet Take 17 g by mouth daily. 09/16/20   Johnson, Clanford L, MD  pravastatin (PRAVACHOL) 80 MG tablet Take 80 mg by mouth daily.    [provider]  sulfamethoxazole-trimethoprim (BACTRIM DS) 800-160 MG tablet Take 1 tablet by mouth 2 (two) times  daily. 11/08/20   Vickki Hearing, MD  tamsulosin (FLOMAX) 0.4 MG CAPS capsule Take 0.4 mg by mouth daily.    [provider]  traMADol (ULTRAM) 50 MG tablet Take 1 tablet (50 mg total) by mouth every 6 (six) hours as needed. 11/08/20   Vickki Hearing, MD    Allergies    Patient has no known allergies.  Review of Systems   Review of Systems  All other systems reviewed and are negative.  Physical Exam Updated Vital Signs BP (!) 143/84   Pulse 89   Temp (!) 97.5 F (36.4 C) (Oral)   Resp 15   Ht 1.905 m (6\' 3" )   Wt 136.1 kg   SpO2 100%   BMI 37.50 kg/m   Physical Exam Vitals and nursing note reviewed.  Constitutional:      Appearance: He is well-developed. He is ill-appearing.  HENT:     Head: Normocephalic and atraumatic.     Right Ear: External ear normal.     Left Ear: External ear normal.  Eyes:     General: No scleral icterus.       Right eye: No discharge.        Left eye: No discharge.     Conjunctiva/sclera: Conjunctivae normal.  Neck:     Trachea: No tracheal deviation.  Cardiovascular:     Rate and Rhythm: Normal rate and regular rhythm.  Pulmonary:     Effort: Pulmonary effort is normal. No respiratory distress.     Breath sounds: Normal breath sounds. No stridor. No wheezing or rales.  Abdominal:     General: Bowel sounds are normal. There is no distension.     Palpations: Abdomen is soft.     Tenderness: There is no abdominal tenderness. There is no guarding or rebound.  Musculoskeletal:        General: No tenderness or deformity.     Cervical back: Neck supple.     Comments: Left lower extremity in a splint, dressing removed small ulceration noted on the lateral aspect of the ankle, no erythema or purulent drainage  Skin:    General: Skin is warm and dry.     Findings: No rash.  Neurological:     General: No focal deficit present.     Mental Status: He is alert.     Cranial Nerves: No cranial nerve deficit (no facial droop,  extraocular movements intact, no slurred speech).     Sensory: No sensory deficit.     Motor: Weakness (generalized) present. No abnormal muscle tone or seizure activity.     Coordination: Coordination normal.  Psychiatric:        Mood and Affect: Mood normal.    ED Results / Procedures / Treatments   Labs (all labs ordered are listed, but only abnormal results are displayed) Labs Reviewed  COMPREHENSIVE METABOLIC  PANEL - Abnormal; Notable for the following components:      Result Value   Glucose, Bld 150 (*)    Total Protein 8.4 (*)    All other components within normal limits  CBC WITH DIFFERENTIAL/PLATELET - Abnormal; Notable for the following components:   Hemoglobin 12.8 (*)    MCHC 29.3 (*)    RDW 18.4 (*)    All other components within normal limits  URINALYSIS, ROUTINE W REFLEX MICROSCOPIC - Abnormal; Notable for the following components:   Glucose, UA >=500 (*)    Ketones, ur 5 (*)    All other components within normal limits  CBG MONITORING, ED - Abnormal; Notable for the following components:   Glucose-Capillary 144 (*)    All other components within normal limits  RESP PANEL BY RT-PCR (FLU A&B, COVID) [id number]  C DIFFICILE QUICK SCREEN W PCR REFLEX    LIPASE, BLOOD    EKG EKG Interpretation  Date/Time:  Tuesday November 28 2020 15:27:04 EDT Ventricular Rate:  85 PR Interval:  232 QRS Duration: 123 QT Interval:  406 QTC Calculation: 483 R Axis:   -23 Text Interpretation: Sinus rhythm Atrial premature complex Prolonged PR interval Left bundle branch block No significant change since last tracing Confirmed by Linwood Dibbles [id number]) on 11/28/2020 3:51:03 PM  Radiology CT HEAD WO CONTRAST ( )  Result Date: 11/28/2020 CLINICAL DATA:  Mental status changes. EXAM: CT HEAD WITHOUT CONTRAST TECHNIQUE: Contiguous axial images were obtained from the base of the skull through the vertex without intravenous contrast. COMPARISON:  None. FINDINGS: Brain: Age related cerebral  atrophy, ventriculomegaly and periventricular white matter disease. No extra-axial fluid collections are identified. No CT findings for acute hemispheric infarction or intracranial hemorrhage. No mass lesions. The brainstem and cerebellum are normal. Vascular: Vascular calcifications but no aneurysm or hyperdense vessels. Skull: No skull fracture or bone lesions. Sinuses/Orbits: The paranasal sinuses and mastoid air cells are clear. The globes are intact. Other: No scalp lesions or scalp hematoma. IMPRESSION: 1. Age related cerebral atrophy, ventriculomegaly and periventricular white matter disease. 2. No acute intracranial findings or mass lesions. Electronically Signed   By: Rudie Meyer M.D.   On: 11/28/2020 16:53   DG Abd Acute W/Chest  Result Date: 11/28/2020 CLINICAL DATA:  Vomiting and diarrhea, syncope episdode last nightvomiting and diarrhea EXAM: DG ABDOMEN ACUTE WITH 1 VIEW CHEST COMPARISON:  07/07/2018 FINDINGS: Normal mediastinum and cardiac silhouette. Chronic central bronchitic markings. Normal pulmonary vasculature. No effusion, infiltrate, or pneumothorax. No dilated loops of large or small bowel. Gas and stool in the rectum. No pathologic calcifications. No organomegaly. No acute osseous abnormality Degenerate spurring of the imaged joints IMPRESSION: 1. No acute cardiopulmonary findings. 2. No acute abdominal Electronically Signed   By: Genevive Bi M.D.   On: 11/28/2020 18:05    Procedures Procedures   Medications Ordered in ED Medications  sodium chloride 0.9 % bolus 1,000 mL (0 mLs Intravenous Stopped 11/28/20 1800)    And  0.9 %  sodium chloride infusion ( Intravenous New Bag/Given 11/28/20 1801)  ondansetron (ZOFRAN) injection 4 mg (4 mg Intravenous Given 11/28/20 1611)    ED Course  I have reviewed the triage vital signs and the nursing notes.  Pertinent labs & imaging results that were available during my care of the patient were reviewed by me and considered in my  medical decision making (see chart for details).  Clinical Course as of 11/28/20 1855  Tue Nov 28, 2020  1738 CBC is normal.  Metabolic panel shows slight hyperglycemia [JK]  1739 CT without acute findings [JK]    Clinical Course User Index [JK] Linwood Dibbles, MD   MDM Rules/Calculators/A&P                           Patient presented to the ED for syncopal episode.  Initially he told me that he had been having issues with vomiting and diarrhea and subsequently had a syncopal episode.  Family however clarified that the patient was being bathed with the assistance of family members.  He then had a syncopal episode.  After he came to's when he had some vomiting and diarrhea.  Possible this could have been a vasovagal event I am concerned about the possibility of cardiac etiology.  Patient's ED work-up does not show any signs of severe dehydration.  No signs of significant anemia.  Urinalysis does not suggest infection.  Acute abdominal series and head CT without significant abnormality.  Patient was scheduled for an MRI of his extremity tomorrow, wound was examined and does not appear to be cellulitic or having any purulent drainage.  With his syncope and comorbidities I do think he would benefit from admission to the hospital for cardiac monitoring and observation. Final Clinical Impression(s) / ED Diagnoses Final diagnoses:  Syncope, unspecified syncope type     Linwood Dibbles, MD 11/28/20 [phone]

## 2020-11-28 NOTE — ED Notes (Signed)
Pt received 4mg  zofran in EMS

## 2020-11-28 NOTE — Telephone Encounter (Signed)
Patient's daughter Angel Norman, designated contact, called to relay she is with patient at [facility] due to having 'passed out' - states called EMS who transported patient to hospital. Asked about the Clindomycin (per previous telephone note) and about MRI scheduled for tomorrow, 11/29/20. Aware all is pending today's emergency room evaluation.

## 2020-11-29 ENCOUNTER — Observation Stay (HOSPITAL_COMMUNITY)
Admit: 2020-11-29 | Discharge: 2020-11-29 | Disposition: A | Discharge status: Home / Self Care | Payer: Medicare PPO | Attending: Internal Medicine | Admitting: Internal Medicine

## 2020-11-29 ENCOUNTER — Observation Stay (HOSPITAL_COMMUNITY): Payer: Medicare PPO

## 2020-11-29 ENCOUNTER — Observation Stay: Payer: Medicare PPO

## 2020-11-29 ENCOUNTER — Observation Stay (HOSPITAL_BASED_OUTPATIENT_CLINIC_OR_DEPARTMENT_OTHER): Payer: Medicare PPO

## 2020-11-29 DIAGNOSIS — S91002S Unspecified open wound, left ankle, sequela: Secondary | ICD-10-CM | POA: Diagnosis not present

## 2020-11-29 DIAGNOSIS — R55 Syncope and collapse: Secondary | ICD-10-CM

## 2020-11-29 DIAGNOSIS — E1165 Type 2 diabetes mellitus with hyperglycemia: Secondary | ICD-10-CM

## 2020-11-29 DIAGNOSIS — K219 Gastro-esophageal reflux disease without esophagitis: Secondary | ICD-10-CM

## 2020-11-29 DIAGNOSIS — E782 Mixed hyperlipidemia: Secondary | ICD-10-CM

## 2020-11-29 DIAGNOSIS — I6523 Occlusion and stenosis of bilateral carotid arteries: Secondary | ICD-10-CM | POA: Diagnosis not present

## 2020-11-29 LAB — CBC
HCT: 39.1 % (ref 39.0–52.0)
Hemoglobin: 11.5 g/dL — ABNORMAL LOW (ref 13.0–17.0)
MCH: 26.5 pg (ref 26.0–34.0)
MCHC: 29.4 g/dL — ABNORMAL LOW (ref 30.0–36.0)
MCV: 90.1 fL (ref 80.0–100.0)
Platelets: 240 10*3/uL (ref 150–400)
RBC: 4.34 MIL/uL (ref 4.22–5.81)
RDW: 18.1 % — ABNORMAL HIGH (ref 11.5–15.5)
WBC: 5.2 10*3/uL (ref 4.0–10.5)
nRBC: 0 % (ref 0.0–0.2)

## 2020-11-29 LAB — COMPREHENSIVE METABOLIC PANEL
ALT: 17 U/L (ref 0–44)
AST: 19 U/L (ref 15–41)
Albumin: 3.2 g/dL — ABNORMAL LOW (ref 3.5–5.0)
Alkaline Phosphatase: 64 U/L (ref 38–126)
Anion gap: 6 (ref 5–15)
BUN: 14 mg/dL (ref 8–23)
CO2: 25 mmol/L (ref 22–32)
Calcium: 8.6 mg/dL — ABNORMAL LOW (ref 8.9–10.3)
Chloride: 105 mmol/L (ref 98–111)
Creatinine, Ser: 0.92 mg/dL (ref 0.61–1.24)
GFR, Estimated: >60 mL/min (ref >60)
Glucose, Bld: 98 mg/dL (ref 70–99)
Potassium: 4.3 mmol/L (ref 3.5–5.1)
Sodium: 136 mmol/L (ref 135–145)
Total Bilirubin: 0.2 mg/dL — ABNORMAL LOW (ref 0.3–1.2)
Total Protein: 7 g/dL (ref 6.5–8.1)

## 2020-11-29 LAB — TROPONIN I (HIGH SENSITIVITY): Troponin I (High Sensitivity): 4 ng/L (ref <18)

## 2020-11-29 LAB — PHOSPHORUS: Phosphorus: 3.8 mg/dL (ref 2.5–4.6)

## 2020-11-29 LAB — ECHOCARDIOGRAM LIMITED
Calc EF: 54.1 %
Height: 75 in
S' Lateral: 2.8 cm
Single Plane A2C EF: 53 %
Single Plane A4C EF: 53.1 %
Weight: 4800 oz

## 2020-11-29 LAB — APTT: aPTT: 33 seconds (ref 24–36)

## 2020-11-29 LAB — MAGNESIUM: Magnesium: 2.1 mg/dL (ref 1.7–2.4)

## 2020-11-29 LAB — PROTIME-INR
INR: 1.1 (ref 0.8–1.2)
Prothrombin Time: 14.4 seconds (ref 11.4–15.2)

## 2020-11-29 IMAGING — MR MR ANKLE*L* W/O CM
5 series · 34 of 40 positions shown · non-contrast
Comparison: Left ankle x-rays dated [DATE].

CLINICAL DATA: Ankle fracture status post ORIF in [REDACTED], complicated
by wound infection.

EXAM:
MRI OF THE LEFT ANKLE WITHOUT CONTRAST
TECHNIQUE: Multiplanar, multisequence MR imaging of the ankle was performed. No
intravenous contrast was administered.

[Series 4: T2 fat-sat · axial · left · 3.0mm · 0.43mm/px · z∈[-78,+58]mm · 9 of 40 slices shown (1 of 2)]
[im 1/40]
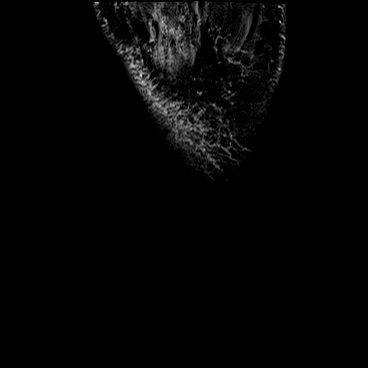
[im 5/40]
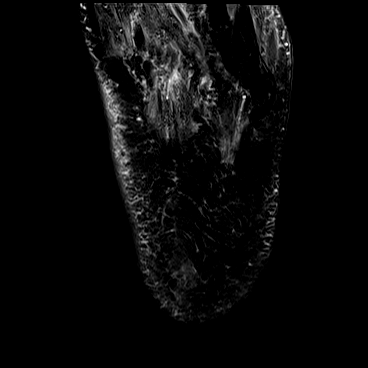
[im 10/40]
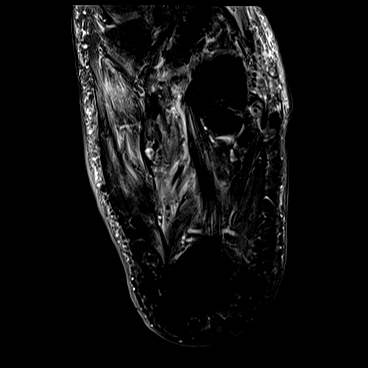
[im 15/40]
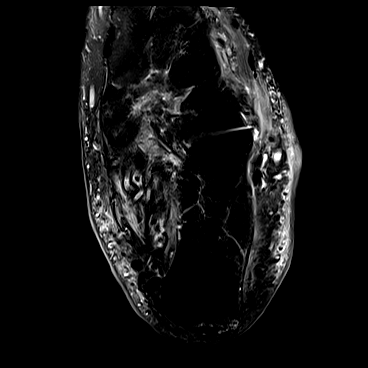
[im 20/40]
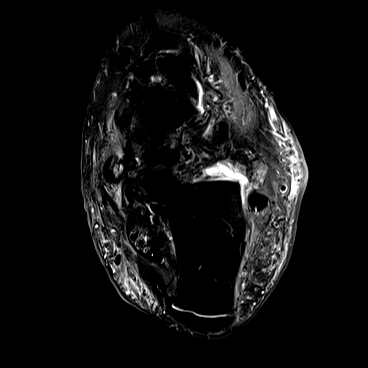
[im 25/40]
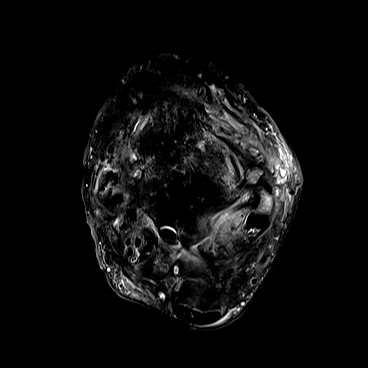
[im 30/40]
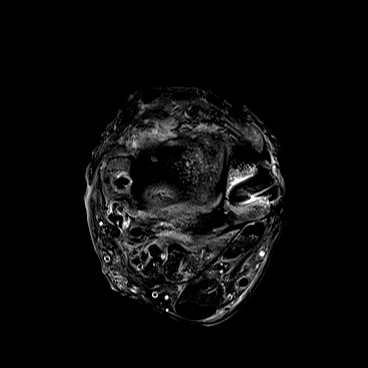
[im 35/40]
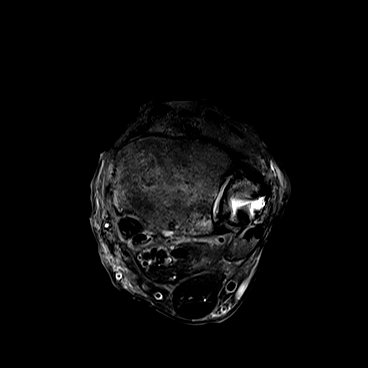
[im 40/40]
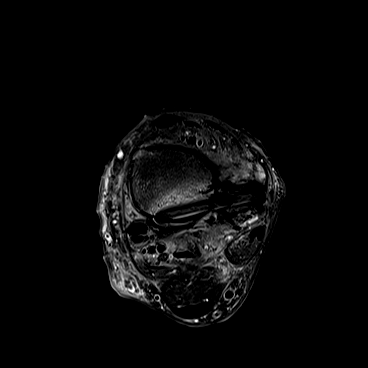

[Series 5: PD fat-sat · axial · left · 3.0mm · 0.42mm/px · z∈[-78,+58]mm · 8 of 40 slices shown]
[im 1/40]
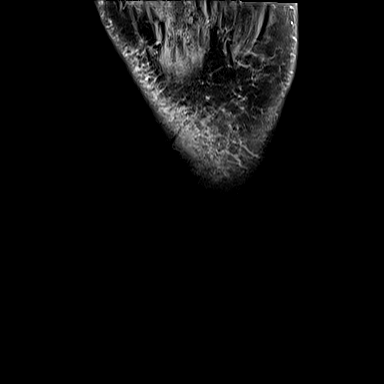
[im 5/40]
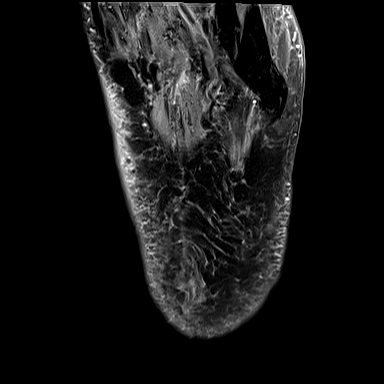
[im 14/40]
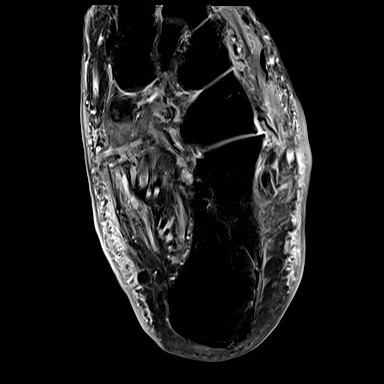
[im 18/40]
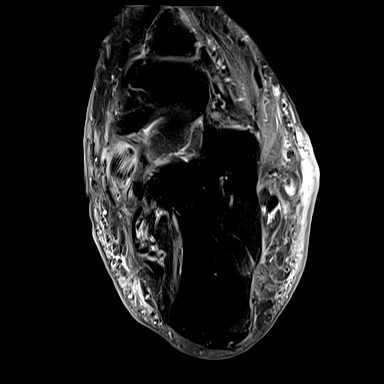
[im 22/40]
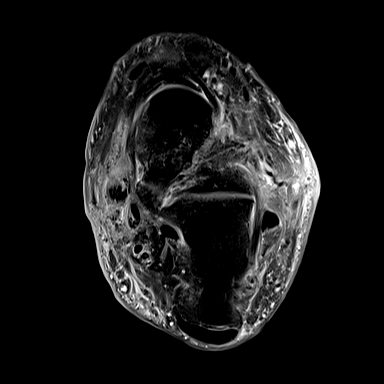
[im 27/40]
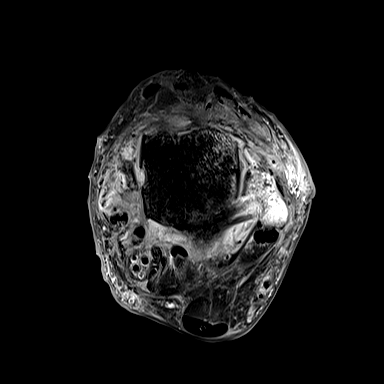
[im 35/40]
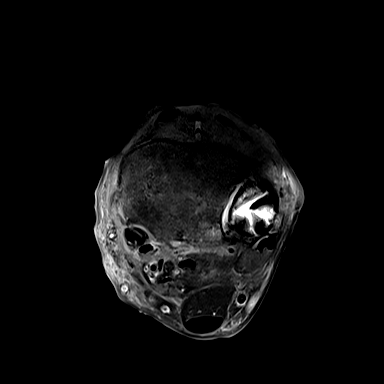
[im 40/40]
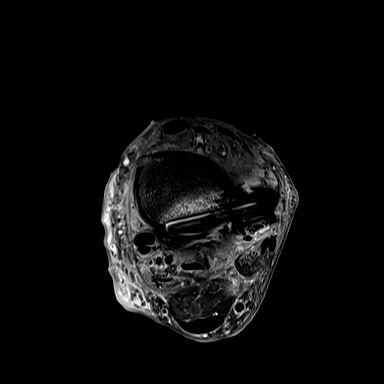

[Series 6: T2 fat-sat · coronal · left · 3.0mm · 0.45mm/px · 9 of 35 slices shown (2 of 2)]
[im 1/35]
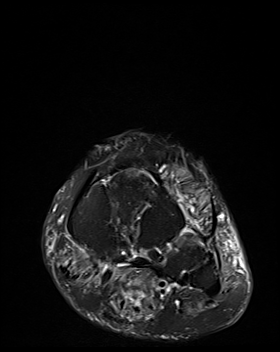
[im 5/35]
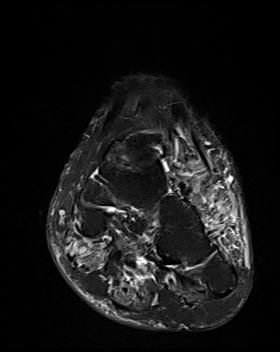
[im 9/35]
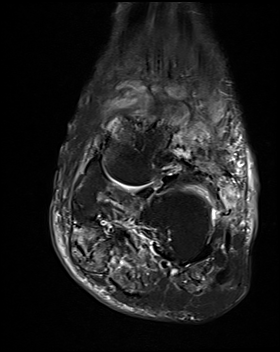
[im 13/35]
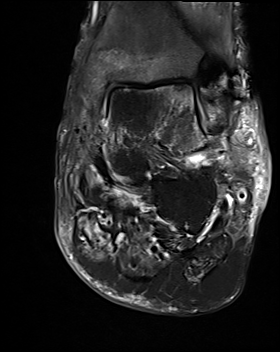
[im 18/35]
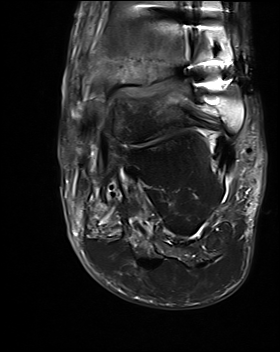
[im 22/35]
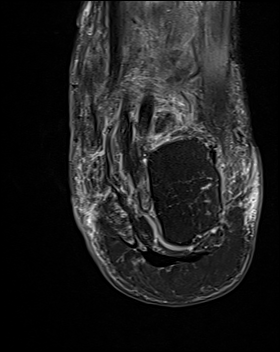
[im 26/35]
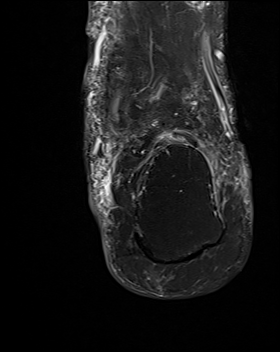
[im 30/35]
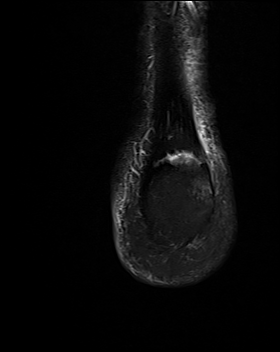
[im 35/35]
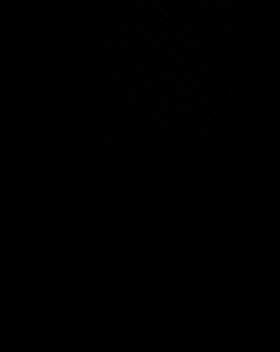

[Series 7: T1 · sagittal · left · 4.0mm · 0.42mm/px · 6 of 23 slices shown]
[im 1/23]
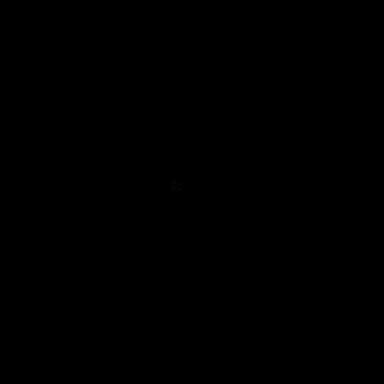
[im 5/23]
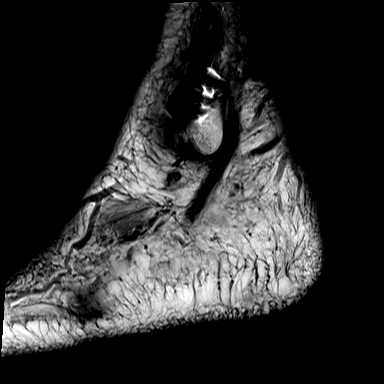
[im 9/23]
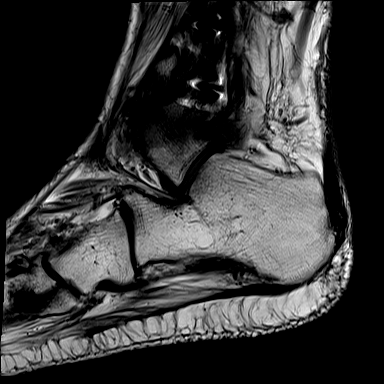
[im 14/23]
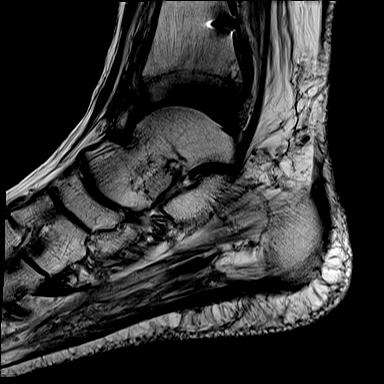
[im 18/23]
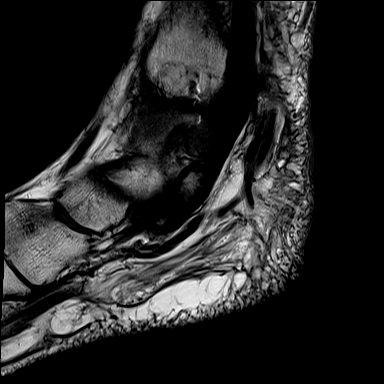
[im 23/23]
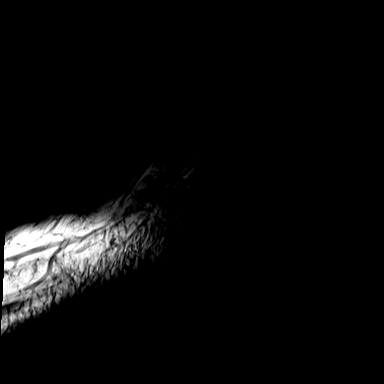

[Series 8: STIR · sagittal · left · 4.0mm · 0.31mm/px · 2 of 23 slices shown]
[im 1/23]
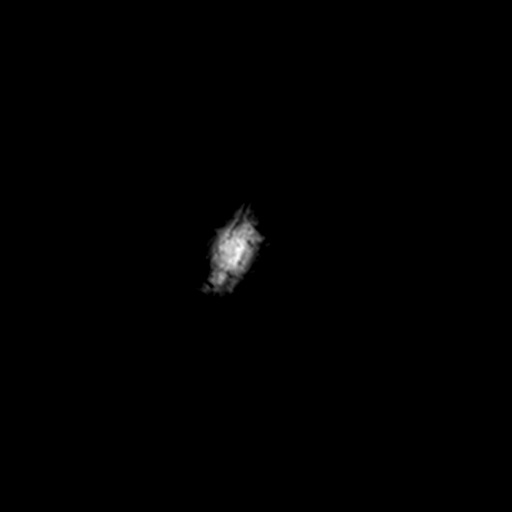
[im 5/23]
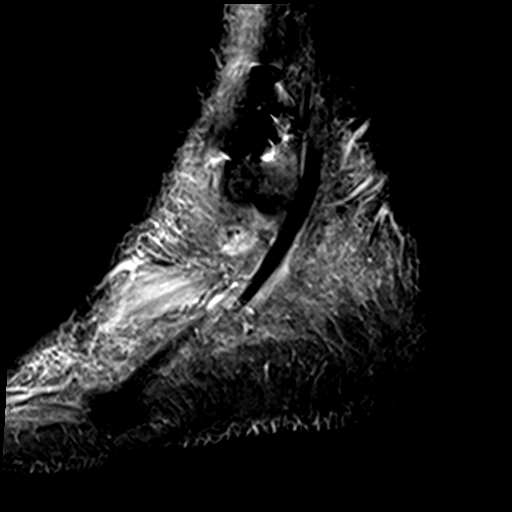

[34 of 40 positions shown; findings below may reference images not displayed]

FINDINGS: TENDONS

Peroneal: Peroneal longus tendon intact. Peroneal brevis intact.

Posteromedial: Tendinosis and high-grade partial tear of the
posterior tibial tendon. Flexor digitorum longus tendon intact.
Flexor hallucis longus tendon intact.

Anterior: Tibialis anterior tendon intact. Extensor hallucis longus
tendon intact Extensor digitorum longus tendon intact.

Achilles:  Intact.

Plantar Fascia: Intact.

LIGAMENTS

Lateral: Anterior talofibular ligament intact. Calcaneofibular
ligament intact. Posterior talofibular ligament intact. Anterior and
posterior tibiofibular ligaments intact.

Medial: Deltoid ligament intact. Spring ligament intact.

CARTILAGE

Ankle Joint: No significant joint effusion. Slight anterior
subluxation of the talus with respect to the tibial plafond. Mild
partial-thickness cartilage loss.

Subtalar Joints/Sinus Tarsi: Normal subtalar joints. No subtalar
joint effusion. Normal sinus tarsi.

Bones: Erosion of the posterior malleolus (series 7, image 12) with
prominent marrow edema throughout the tibial plafond. Subchondral
fracture in the lateral talar dome with surrounding marrow edema
(series 8, image 14). Postsurgical changes of the distal fibula and
medial malleolus.

Soft Tissue: Bimalleolar soft tissue swelling, greater medially.
Superficial ulceration over the medial malleolus. The no soft tissue
mass or fluid collection.
IMPRESSION: 1. Erosion of the posterior malleolus with prominent marrow edema
throughout the tibial plafond, concerning for osteomyelitis. Given
involvement of the articular surface, tibiotalar septic arthritis is
also a concern despite the lack of a significant joint effusion.
2. Subchondral fracture in the lateral talar dome.
3. Superficial ulceration of the medial malleolus.  No abscess.
4. Tendinosis and high-grade partial tear of the posterior tibial
tendon.

## 2020-11-29 IMAGING — US US CAROTID DUPLEX BILAT
1 series · 13 of 24 positions shown · non-contrast
Comparison: None.

CLINICAL DATA: Syncope

EXAM:
BILATERAL CAROTID DUPLEX ULTRASOUND
TECHNIQUE: Gray scale imaging, color Doppler and duplex ultrasound were
performed of bilateral carotid and vertebral arteries in the neck.

[Series 1: us carotid bilateral · 13 of 68 slices shown]
[im 1/68]
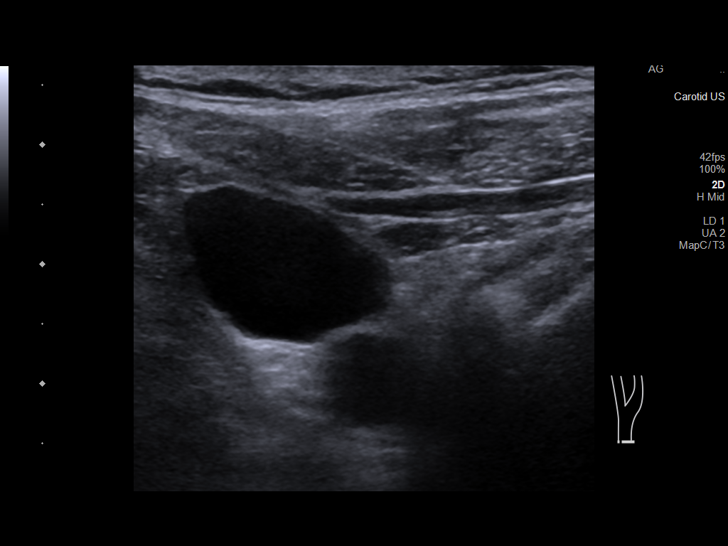
[im 6/68]
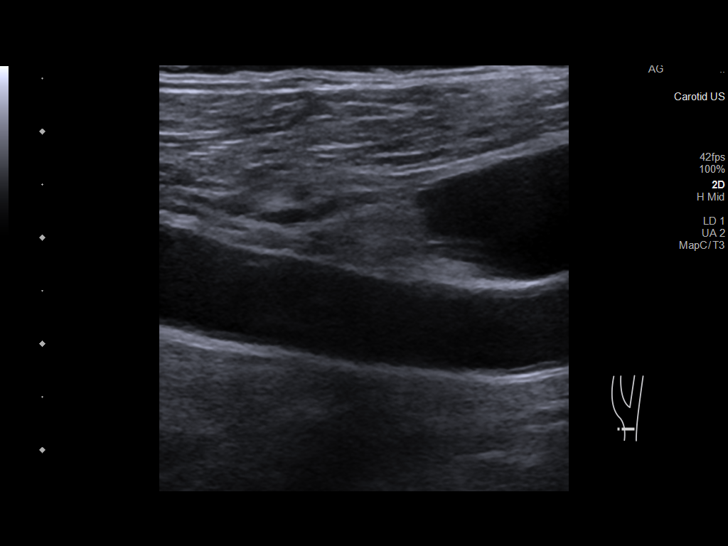
[im 12/68]
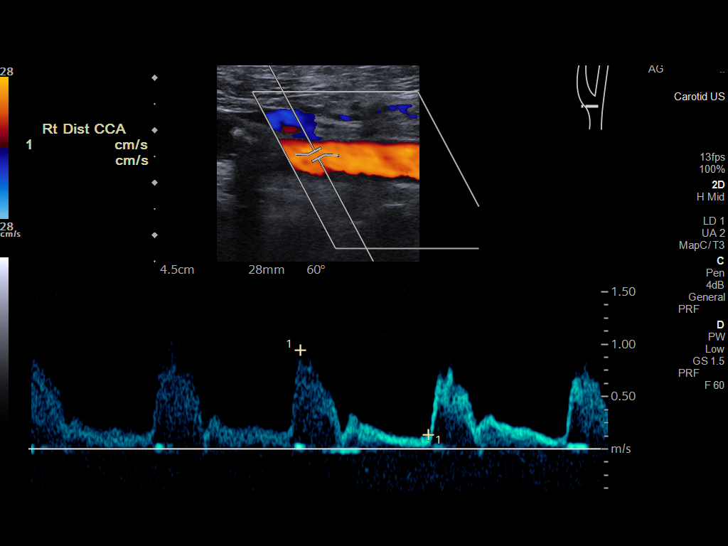
[im 18/68]
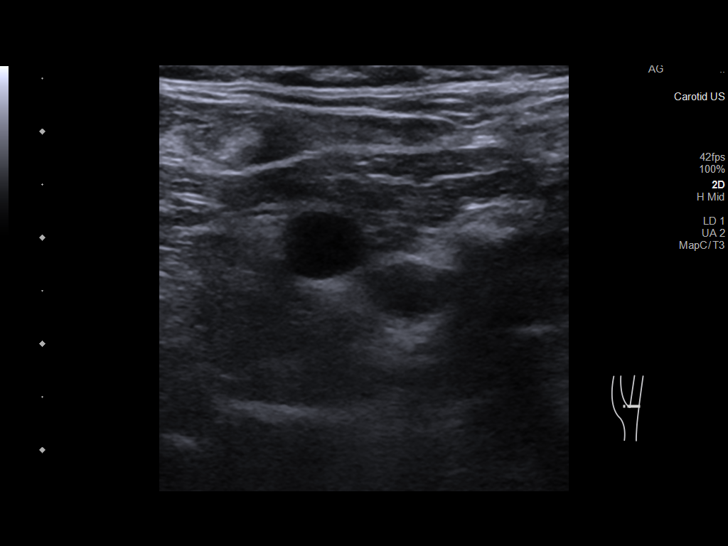
[im 24/68]
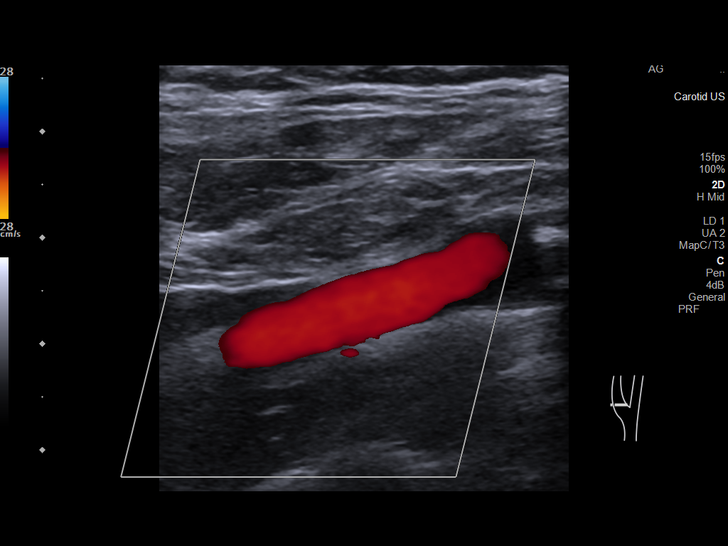
[im 30/68]
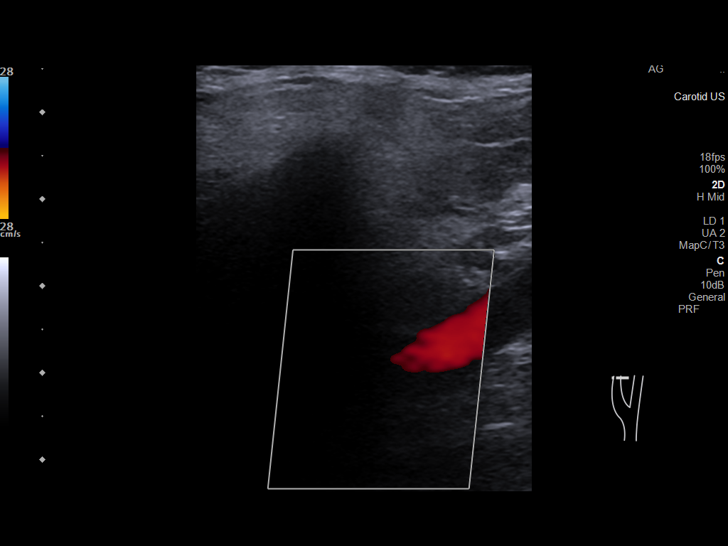
[im 35/68]
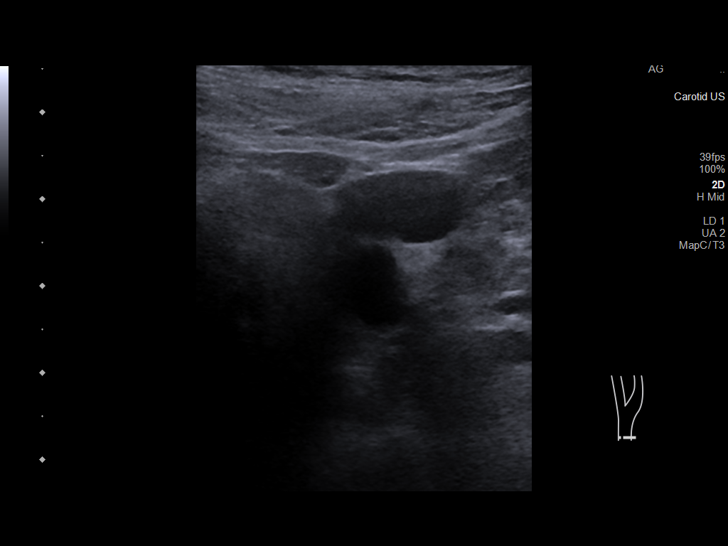
[im 38/68]
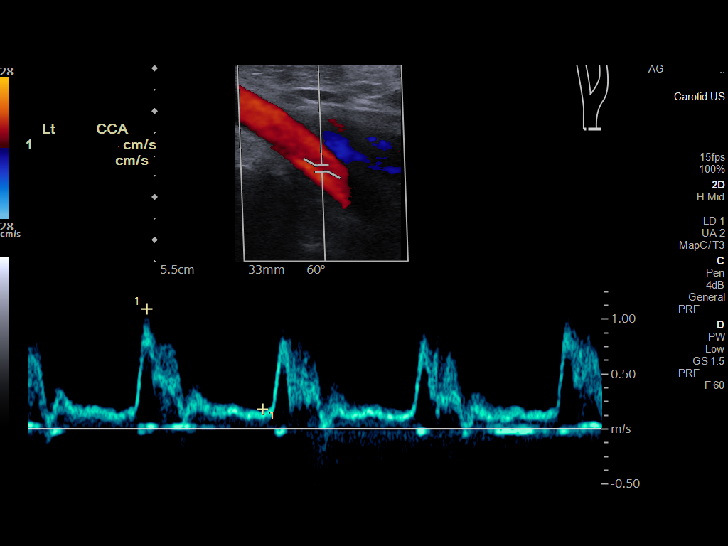
[im 44/68]
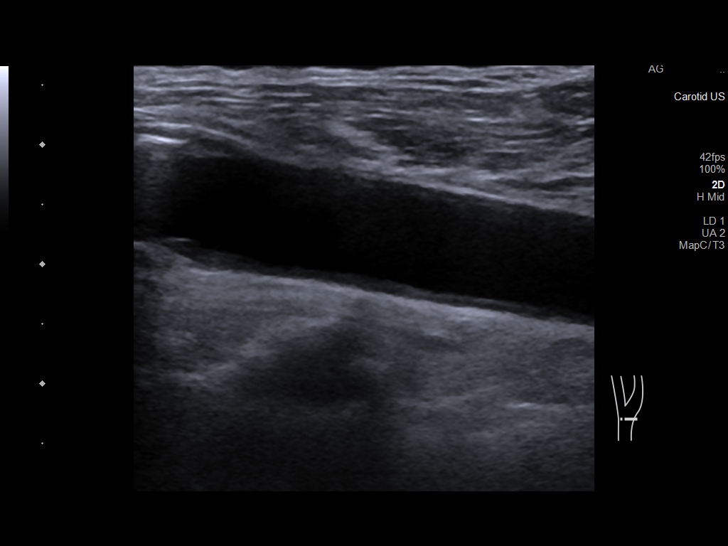
[im 50/68]
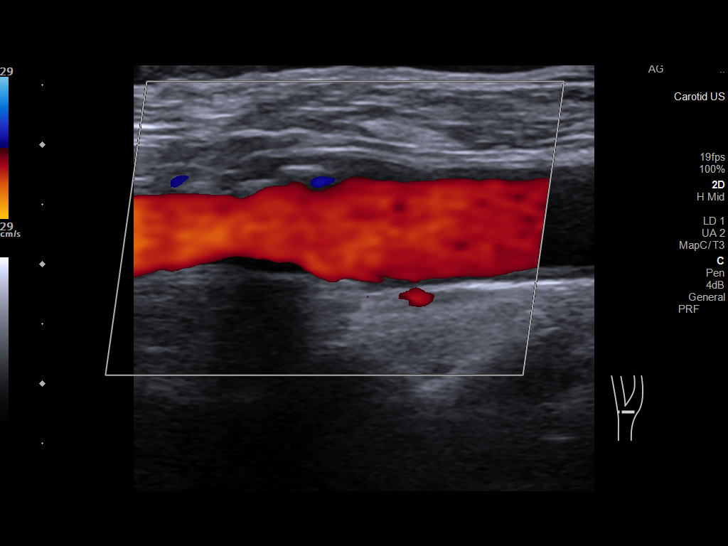
[im 56/68]
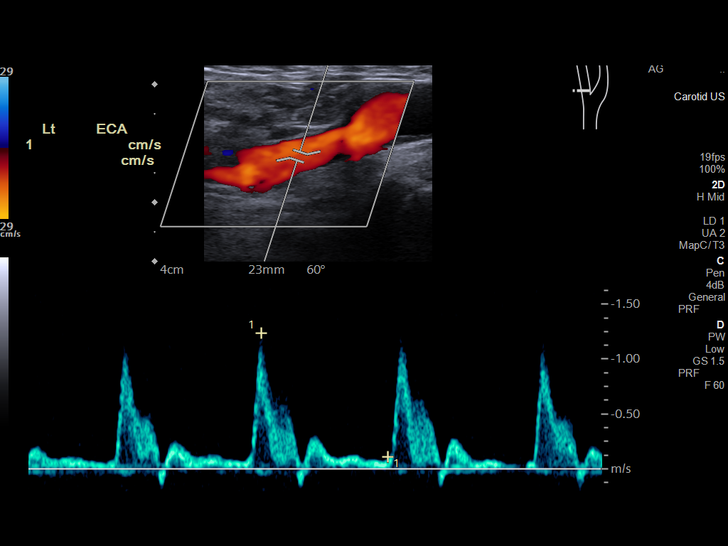
[im 62/68]
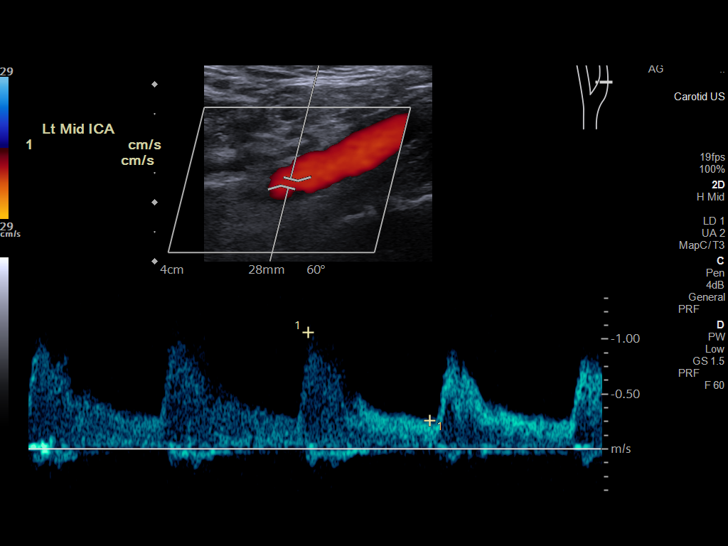
[im 68/68]
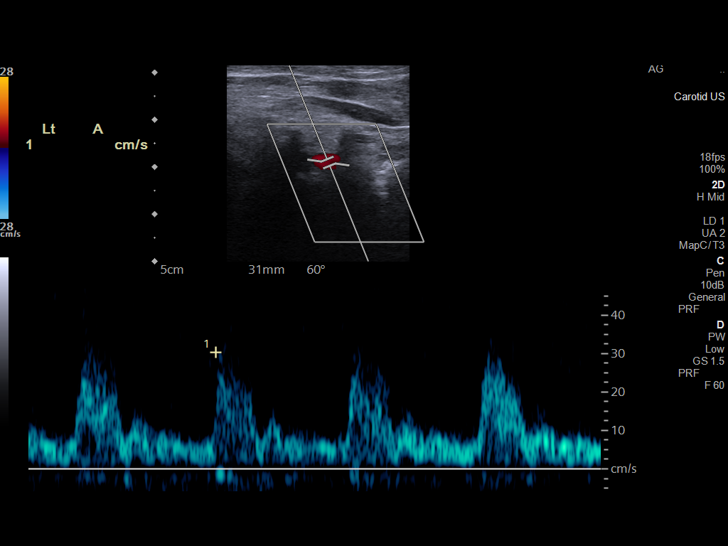

[13 of 24 positions shown; findings below may reference images not displayed]

FINDINGS: Criteria: Quantification of carotid stenosis is based on velocity
parameters that correlate the residual internal carotid diameter
with NASCET-based stenosis levels, using the diameter of the distal
internal carotid lumen as the denominator for stenosis measurement.

The following velocity measurements were obtained:

RIGHT

ICA: 80/22 cm/sec

CCA: 76/13 cm/sec

SYSTOLIC ICA/CCA RATIO:

ECA: 129 cm/sec

LEFT

ICA: 106/26 cm/sec

CCA: 111/19 cm/sec

SYSTOLIC ICA/CCA RATIO:

ECA: 124 cm/sec

RIGHT CAROTID ARTERY: Mild atherosclerotic disease in the carotid
bulb results in less than 50% stenosis. Normal low resistance
waveforms in the internal carotid artery.

RIGHT VERTEBRAL ARTERY:  Antegrade flow

LEFT CAROTID ARTERY: Minimal plaque in the left carotid bulb results
in less than 50% stenosis. Normal low resistance waveforms in the
internal carotid artery.

LEFT VERTEBRAL ARTERY:  Antegrade flow
IMPRESSION: No evidence of hemodynamically significant stenosis involving either
the right or left carotid circulation in the neck by Doppler
criteria. Mild bilateral atherosclerotic plaque in the carotid bulbs
results in less than 50% stenosis.

## 2020-11-29 MED ORDER — CLINDAMYCIN HCL 300 MG PO CAPS: 300.0000 mg | ORAL_CAPSULE | Freq: Four times a day (QID) | ORAL | 1 refills | Status: DC

## 2020-11-29 MED ORDER — PERFLUTREN LIPID MICROSPHERE
1.0000 mL | INTRAVENOUS | Status: AC | PRN
  Administered 2020-11-29: 2 mL via INTRAVENOUS

## 2020-11-29 NOTE — Progress Notes (Signed)
*  PRELIMINARY RESULTS* Echocardiogram 2D Echocardiogram has been performed.  Carolyne Fiscal 11/29/2020, 1:01 PM

## 2020-11-29 NOTE — Discharge Summary (Signed)
Physician Discharge Summary  [name] [facility] ZDG:[id number] DOB: 06-11-1932 DOA: 11/28/2020  PCP: Lupita Raider, MD Orthopedics: Dr. Romeo Apple  Admit date: 11/28/2020 Discharge date: 11/29/2020  Admitted From:  Home  Disposition: Home with [facility]   Recommendations for Outpatient Follow-up:  Follow up with PCP in 1 weeks, review EEG results Follow up with neurology for further evaluation of syncope, review EEG Follow up with Dr. Romeo Apple to review MRI ankle results   Home Health:  PT, RN   Discharge Condition: STABLE   CODE STATUS: FULL DIET: heart healthy    Brief Hospitalization Summary: Please see all hospital notes, images, labs for full details of the hospitalization. ADMISSION HPI: Angel Norman is a 87 y.o. male with medical history significant for  type 2 diabetes mellitus, GERD, dyslipidemia and osteoarthritis who presents to the emergency department with a syncopal episode at home.  Patient was unable to provide history, history was obtained from daughter at bedside and granddaughter by phone, per report, patient lives at home with wife who has Alzheimer's disease, so daughter and her 3 daughters (granddaughters to patient) usually check on patient on daily basis.  Today, one of the granddaughters was with patient to help with shower and shaving.  Patient was noted to suddenly slump his head backwards with eyes rolling and unconscious for about 2 minutes during which he had a urinary incontinence, and was confused for about 10 minutes (about the time it took for EMS to arrive) prior to returning to his baseline mental status.  He was also reported to have a loose bowel movement after regaining consciousness and this was associated with diaphoresis, nausea and dry heaves.  He denies headache, fever, chills, chest pain or shortness of breath.   ED Course:  In the emergency department, he was hemodynamically stable.  Work-up in the ED showed normocytic anemia, normal BMP except for  hyperglycemia, influenza A, B, SARS coronavirus 2 was negative.  Abdominal x-ray with 1 view chest showed no acute cardiopulmonary findings and no acute abdominal findings.  CT of head without contrast showed no acute intracranial findings or mass lesions IV Zofran was given, IV hydration was provided.  Hospitalist was asked to admit patient for further evaluation and management.  HOSPITAL COURSE  Pt was admitted after he had reported syncopal episode in the shower while bathing.  He quickly recovered and has been stable since that time.  He has been admitted for observation and for that he had a 2D echocardiogram noted below and has been stable from previous testing and no findings to indicate cause for syncopal event.  He also had carotid Doppler studies with no hemodynamically significant stenosis noted.  He has been monitored on telemetry with no significant abnormalities noted with arrhythmias.  In addition, EEG was ordered.  He has remained stable.  I have made a referral for him to follow-up with neurology outpatient for further evaluation.  I feel that this likely was a vasovagal event versus orthostatic hypotension.  He is feeling much better.  I have arranged for him to follow-up with his PCP as recommended in the next week.  He also had an MRI done of his ankle as part of an outpatient work-up that is being completed by Dr. Romeo Apple.  The results of MRI are pending at this time.  Patient has an outpatient follow-up appointment with Dr. Romeo Apple on 12/05/2020.  I have encouraged him to follow-up at that time for his results and for further management as recommended by  Dr. Romeo Apple.  Home health services ordered.  I have ordered for him to have PT and RN home health services.  Patient is anxious to go home.  I think that he is stable to discharge home at this time with close outpatient follow-up as recommended. Pt was advised to follow up EEG results with PCP and neurologist.    Discharge Diagnoses:   Principal Problem:   Syncope Active Problems:   GERD (gastroesophageal reflux disease)   Nausea & vomiting   Obesity (BMI 30-39.9)   Diarrhea   Hyperglycemia due to diabetes mellitus (HCC)   Mixed hyperlipidemia   Ankle wound, left, sequela   Osteoarthritis   Discharge Instructions: Discharge Instructions     Ambulatory referral to Neurology   Complete by: As directed    An appointment is requested in approximately: 2 weeks      Allergies as of 11/29/2020   No Known Allergies      Medication List     STOP taking these medications    aspirin EC 81 MG tablet   pantoprazole 40 MG tablet Commonly known as: Protonix   polyethylene glycol 17 g packet Commonly known as: MIRALAX / GLYCOLAX       TAKE these medications    acetaminophen 325 MG tablet Commonly known as: TYLENOL Take 2 tablets (650 mg total) by mouth every 6 (six) hours.   clindamycin 300 MG capsule Commonly known as: CLEOCIN Take 1 capsule (300 mg total) by mouth 4 (four) times daily.   Jardiance 25 MG Tabs tablet Generic drug: empagliflozin Take 25 mg by mouth every morning.   metFORMIN 1000 MG tablet Commonly known as: GLUCOPHAGE Take 1,000 mg by mouth 2 (two) times daily.   OVER THE COUNTER MEDICATION Take 1 tablet by mouth daily. Cera-Vite   pravastatin 80 MG tablet Commonly known as: PRAVACHOL Take 80 mg by mouth daily.   sulfamethoxazole-trimethoprim 800-160 MG tablet Commonly known as: BACTRIM DS Take 1 tablet by mouth 2 (two) times daily.   tamsulosin 0.4 MG Caps capsule Commonly known as: FLOMAX Take 0.4 mg by mouth daily.   traMADol 50 MG tablet Commonly known as: ULTRAM Take 1 tablet (50 mg total) by mouth every 6 (six) hours as needed.        Follow-up Information     Lupita Raider, MD. Schedule an appointment as soon as possible for a visit.   Specialty: Family Medicine Why: Hospital Follow Up Contact information: [address] [place]  [place] [address] [phone]         Vickki Hearing, MD. Schedule an appointment as soon as possible for a visit in 1 week(s).   Specialties: Orthopedic Surgery, Radiology Why: Hospital Follow Up and review MRI results Contact information: [address] [place] [address] [phone]                No Known Allergies Allergies as of 11/29/2020   No Known Allergies      Medication List     STOP taking these medications    aspirin EC 81 MG tablet   pantoprazole 40 MG tablet Commonly known as: Protonix   polyethylene glycol 17 g packet Commonly known as: MIRALAX / GLYCOLAX       TAKE these medications    acetaminophen 325 MG tablet Commonly known as: TYLENOL Take 2 tablets (650 mg total) by mouth every 6 (six) hours.   clindamycin 300 MG capsule Commonly known as: CLEOCIN Take 1 capsule (300  mg total) by mouth 4 (four) times daily.   Jardiance 25 MG Tabs tablet Generic drug: empagliflozin Take 25 mg by mouth every morning.   metFORMIN 1000 MG tablet Commonly known as: GLUCOPHAGE Take 1,000 mg by mouth 2 (two) times daily.   OVER THE COUNTER MEDICATION Take 1 tablet by mouth daily. Cera-Vite   pravastatin 80 MG tablet Commonly known as: PRAVACHOL Take 80 mg by mouth daily.   sulfamethoxazole-trimethoprim 800-160 MG tablet Commonly known as: BACTRIM DS Take 1 tablet by mouth 2 (two) times daily.   tamsulosin 0.4 MG Caps capsule Commonly known as: FLOMAX Take 0.4 mg by mouth daily.   traMADol 50 MG tablet Commonly known as: ULTRAM Take 1 tablet (50 mg total) by mouth every 6 (six) hours as needed.        Procedures/Studies: DG Ankle Complete Left  Result Date: 11/08/2020 Left ankle surgery fixation Interfrag screw seen on the lateral malleolus Multiple screws in the lateral plate including the syndesmosis screw There is also a medial anchor from a deltoid ligament injury The ankle mortise is reduced and intact the fracture  fixation remains stable Impression stable fixation bimalleolar equivalent ankle fracture with internal fixation  CT HEAD WO CONTRAST ( )  Result Date: 11/28/2020 CLINICAL DATA:  Mental status changes. EXAM: CT HEAD WITHOUT CONTRAST TECHNIQUE: Contiguous axial images were obtained from the base of the skull through the vertex without intravenous contrast. COMPARISON:  None. FINDINGS: Brain: Age related cerebral atrophy, ventriculomegaly and periventricular white matter disease. No extra-axial fluid collections are identified. No CT findings for acute hemispheric infarction or intracranial hemorrhage. No mass lesions. The brainstem and cerebellum are normal. Vascular: Vascular calcifications but no aneurysm or hyperdense vessels. Skull: No skull fracture or bone lesions. Sinuses/Orbits: The paranasal sinuses and mastoid air cells are clear. The globes are intact. Other: No scalp lesions or scalp hematoma. IMPRESSION: 1. Age related cerebral atrophy, ventriculomegaly and periventricular white matter disease. 2. No acute intracranial findings or mass lesions. Electronically Signed   By: Rudie Meyer M.D.   On: 11/28/2020 16:53   US Carotid Bilateral  Result Date: 11/29/2020 CLINICAL DATA:  Syncope EXAM: BILATERAL CAROTID DUPLEX ULTRASOUND TECHNIQUE: Wallace Cullens scale imaging, color Doppler and duplex ultrasound were performed of bilateral carotid and vertebral arteries in the neck. COMPARISON:  None. FINDINGS: Criteria: Quantification of carotid stenosis is based on velocity parameters that correlate the residual internal carotid diameter with NASCET-based stenosis levels, using the diameter of the distal internal carotid lumen as the denominator for stenosis measurement. The following velocity measurements were obtained: RIGHT ICA: 80/22 cm/sec CCA: 76/13 cm/sec SYSTOLIC ICA/CCA RATIO:  1.0 ECA: 129 cm/sec LEFT ICA: 106/26 cm/sec CCA: 111/19 cm/sec SYSTOLIC ICA/CCA RATIO:  1.1 ECA: 124 cm/sec RIGHT CAROTID ARTERY:  Mild atherosclerotic disease in the carotid bulb results in less than 50% stenosis. Normal low resistance waveforms in the internal carotid artery. RIGHT VERTEBRAL ARTERY:  Antegrade flow LEFT CAROTID ARTERY: Minimal plaque in the left carotid bulb results in less than 50% stenosis. Normal low resistance waveforms in the internal carotid artery. LEFT VERTEBRAL ARTERY:  Antegrade flow IMPRESSION: No evidence of hemodynamically significant stenosis involving either the right or left carotid circulation in the neck by Doppler criteria. Mild bilateral atherosclerotic plaque in the carotid bulbs results in less than 50% stenosis. Electronically Signed   By: Olive Bass M.D.   On: 11/29/2020 14:46   DG Abd Acute W/Chest  Result Date: 11/28/2020 CLINICAL DATA:  Vomiting and diarrhea, syncope episdode last  nightvomiting and diarrhea EXAM: DG ABDOMEN ACUTE WITH 1 VIEW CHEST COMPARISON:  07/07/2018 FINDINGS: Normal mediastinum and cardiac silhouette. Chronic central bronchitic markings. Normal pulmonary vasculature. No effusion, infiltrate, or pneumothorax. No dilated loops of large or small bowel. Gas and stool in the rectum. No pathologic calcifications. No organomegaly. No acute osseous abnormality Degenerate spurring of the imaged joints IMPRESSION: 1. No acute cardiopulmonary findings. 2. No acute abdominal Electronically Signed   By: Genevive Bi M.D.   On: 11/28/2020 18:05   ECHOCARDIOGRAM LIMITED  Result Date: 11/29/2020    ECHOCARDIOGRAM LIMITED REPORT   Patient Name:   Angel Norman Date of Exam: 11/29/2020 Medical Rec #:  [id number]    Height:       75.0 in Accession #:    [id number]   Weight:       300.0 lb Date of Birth:  [birth year]    BSA:          2.607 m Patient Age:    87 years     BP:           119/94 mmHg Patient Gender: M            HR:           73 bpm. [facility] Procedure: Limited Echo Indications:    Syncope  History:        Patient has prior history of Echocardiogram  examinations, most                 recent 09/14/2020. CAD, Signs/Symptoms:Syncope; Risk                 Factors:Diabetes. CKD, Morbid Obesity.  Sonographer:    Mikki Harbor Referring Phys: [id number] OLADAPO ADEFESO IMPRESSIONS  1. Limited study, no Doppler performed.  2. Left ventricular ejection fraction, by estimation, is approximately 55%. The left ventricle has normal function. The left ventricle has no regional wall motion abnormalities. There is moderate left ventricular hypertrophy.  3. Right ventricular systolic function is low normal. The right ventricular size is mildly enlarged.  4. The mitral valve is grossly normal.  5. The aortic valve is tricuspid. Comparison(s): Prior images reviewed side by side. No signficant change in LVEF. FINDINGS  Left Ventricle: Left ventricular ejection fraction, by estimation, is 55%. The left ventricle has normal function. The left ventricle has no regional wall motion abnormalities. Definity contrast agent was given IV to delineate the left ventricular endocardial borders. The left ventricular internal cavity size was normal in size. There is moderate left ventricular hypertrophy. Right Ventricle: The right ventricular size is mildly enlarged. No increase in right ventricular wall thickness. Right ventricular systolic function is low normal. Pericardium: There is no evidence of pericardial effusion. Presence of pericardial fat pad. Mitral Valve: The mitral valve is grossly normal. Mild mitral annular calcification. Aortic Valve: The aortic valve is tricuspid. There is mild aortic valve annular calcification. Aorta: The aortic root is normal in size and structure. LEFT VENTRICLE PLAX 2D LVIDd:         5.30 cm LVIDs:         2.80 cm LV PW:         1.40 cm LV IVS:        1.40 cm LVOT diam:     2.00 cm LVOT Area:     3.14 cm  LV Volumes (MOD) LV vol d, MOD A2C: 74.2 ml LV vol d, MOD A4C: 60.3 ml LV vol s, MOD A2C:  34.9 ml LV vol s, MOD A4C: 28.3 ml LV SV MOD A2C:     39.3 ml  LV SV MOD A4C:     60.3 ml LV SV MOD BP:      37.0 ml LEFT ATRIUM         Index LA diam:    4.10 cm 1.57 cm/m   AORTA Ao Root diam: 3.20 cm  SHUNTS Systemic Diam: 2.00 cm Nona Dell MD Electronically signed by Nona Dell MD Signature Date/Time: 11/29/2020/1:19:35 PM    Final      Subjective: Pt reports that he feels well and he wants to go home.  He has had no further events, no more syncope, no chest pain and no SOB. No seizure activity.    Discharge Exam: Vitals:   11/29/20 0836 11/29/20 1400  BP: 138/73 133/72  Pulse: 74 74  Resp:  18  Temp:  98.5 F (36.9 C)  SpO2: 95% 96%   Vitals:   11/28/20 2146 11/29/20 0145 11/29/20 0836 11/29/20 1400  BP: 134/65 (!) 119/94 138/73 133/72  Pulse: 88 73 74 74  Resp: 17 20  18   Temp: 98.3 F (36.8 C) 98.1 F (36.7 C)  98.5 F (36.9 C)  TempSrc: Oral Oral  Oral  SpO2: 98% 93% 95% 96%  Weight:      Height:       General: Pt is alert, awake, not in acute distress Cardiovascular: RRR, S1/S2 +, no rubs, no gallops Respiratory: CTA bilaterally, no wheezing, no rhonchi Abdominal: Soft, NT, ND, bowel sounds + Extremities: no edema, no cyanosis Neurological: all CN intact, nonfocal exam.     The results of significant diagnostics from this hospitalization (including imaging, microbiology, ancillary and laboratory) are listed below for reference.     Microbiology: Recent Results (from the past 240 hour(s))  Resp Panel by RT-PCR (Flu A&B, Covid) Nasopharyngeal Swab     Status: None   Collection Time: 11/28/20  4:02 PM   Specimen: Nasopharyngeal Swab; Nasopharyngeal(NP) swabs in vial transport medium  Result Value Ref Range Status   SARS Coronavirus 2 by RT PCR NEGATIVE NEGATIVE Final    Comment: (NOTE) SARS-CoV-2 target nucleic acids are NOT DETECTED.  The SARS-CoV-2 RNA is generally detectable in upper respiratory specimens during the acute phase of infection. The lowest concentration of SARS-CoV-2 viral copies this assay can  detect is 138 copies/mL. A negative result does not preclude SARS-Cov-2 infection and should not be used as the sole basis for treatment or other patient management decisions. A negative result may occur with  improper specimen collection/handling, submission of specimen other than nasopharyngeal swab, presence of viral mutation(s) within the areas targeted by this assay, and inadequate number of viral copies(<138 copies/mL). A negative result must be combined with clinical observations, patient history, and epidemiological information. The expected result is Negative.  Fact Sheet for Patients:  11/30/20  Fact Sheet for Healthcare Providers:  BloggerCourse.com  This test is no t yet approved or cleared by the [place] FDA and  has been authorized for detection and/or diagnosis of SARS-CoV-2 by FDA under an Emergency Use Authorization (EUA). This EUA will remain  in effect (meaning this test can be used) for the duration of the COVID-19 declaration under Section 564(b)(1) of the Act, 21 U.S.C.section 360bbb-3(b)(1), unless the authorization is terminated  or revoked sooner.       Influenza A by PCR NEGATIVE NEGATIVE Final   Influenza B by PCR NEGATIVE NEGATIVE Final  Comment: (NOTE) The Xpert Xpress SARS-CoV-2/FLU/RSV plus assay is intended as an aid in the diagnosis of influenza from Nasopharyngeal swab specimens and should not be used as a sole basis for treatment. Nasal washings and aspirates are unacceptable for Xpert Xpress SARS-CoV-2/FLU/RSV testing.  Fact Sheet for Patients: BloggerCourse.com  Fact Sheet for Healthcare Providers: SeriousBroker.it  This test is not yet approved or cleared by the [place] FDA and has been authorized for detection and/or diagnosis of SARS-CoV-2 by FDA under an Emergency Use Authorization (EUA). This EUA will remain in  effect (meaning this test can be used) for the duration of the COVID-19 declaration under Section 564(b)(1) of the Act, 21 U.S.C. section 360bbb-3(b)(1), unless the authorization is terminated or revoked.  Performed at [facility], [address], [place], [place] [address]      Labs: BNP (last 3 results) No results for input(s): BNP in the last 8760 hours. Basic Metabolic Panel: Recent Labs  Lab 11/28/20 1605 11/29/20 0508  NA 135 136  K 4.2 4.3  CL 103 105  CO2 23 25  GLUCOSE 150* 98  BUN 18 14  CREATININE 1.16 0.92  CALCIUM 9.1 8.6*  MG  --  2.1  PHOS  --  3.8   Liver Function Tests: Recent Labs  Lab 11/28/20 1605 11/29/20 0508  AST 21 19  ALT 21 17  ALKPHOS 82 64  BILITOT 0.5 0.2*  PROT 8.4* 7.0  ALBUMIN 3.9 3.2*   Recent Labs  Lab 11/28/20 1605  LIPASE 29   No results for input(s): AMMONIA in the last 168 hours. CBC: Recent Labs  Lab 11/28/20 1605 11/29/20 0508  WBC 6.3 5.2  NEUTROABS 5.1  --   HGB 12.8* 11.5*  HCT 43.7 39.1  MCV 89.9 90.1  PLT 245 240   Cardiac Enzymes: No results for input(s): CKTOTAL, CKMB, CKMBINDEX, TROPONINI in the last 168 hours. BNP: Invalid input(s): POCBNP CBG: Recent Labs  Lab 11/28/20 1545  GLUCAP 144*   D-Dimer No results for input(s): DDIMER in the last 72 hours. Hgb A1c No results for input(s): HGBA1C in the last 72 hours. Lipid Profile No results for input(s): CHOL, HDL, LDLCALC, TRIG, CHOLHDL, LDLDIRECT in the last 72 hours. Thyroid function studies No results for input(s): TSH, T4TOTAL, T3FREE, THYROIDAB in the last 72 hours.  Invalid input(s): FREET3 Anemia work up No results for input(s): VITAMINB12, FOLATE, FERRITIN, TIBC, IRON, RETICCTPCT in the last 72 hours. Urinalysis    Component Value Date/Time   COLORURINE YELLOW 11/28/2020 1752   APPEARANCEUR CLEAR 11/28/2020 1752   LABSPEC 1.028 11/28/2020 1752   PHURINE 5.0 11/28/2020 1752   GLUCOSEU >=500 (A) 11/28/2020 1752   HGBUR NEGATIVE  11/28/2020 1752   BILIRUBINUR NEGATIVE 11/28/2020 1752   KETONESUR 5 (A) 11/28/2020 1752   PROTEINUR NEGATIVE 11/28/2020 1752   NITRITE NEGATIVE 11/28/2020 1752   LEUKOCYTESUR NEGATIVE 11/28/2020 1752   Sepsis Labs Invalid input(s): PROCALCITONIN,  WBC,  LACTICIDVEN Microbiology Recent Results (from the past 240 hour(s))  Resp Panel by RT-PCR (Flu A&B, Covid) Nasopharyngeal Swab     Status: None   Collection Time: 11/28/20  4:02 PM   Specimen: Nasopharyngeal Swab; Nasopharyngeal(NP) swabs in vial transport medium  Result Value Ref Range Status   SARS Coronavirus 2 by RT PCR NEGATIVE NEGATIVE Final    Comment: (NOTE) SARS-CoV-2 target nucleic acids are NOT DETECTED.  The SARS-CoV-2 RNA is generally detectable in upper respiratory specimens during the acute phase of infection. The lowest concentration of SARS-CoV-2  viral copies this assay can detect is 138 copies/mL. A negative result does not preclude SARS-Cov-2 infection and should not be used as the sole basis for treatment or other patient management decisions. A negative result may occur with  improper specimen collection/handling, submission of specimen other than nasopharyngeal swab, presence of viral mutation(s) within the areas targeted by this assay, and inadequate number of viral copies(<138 copies/mL). A negative result must be combined with clinical observations, patient history, and epidemiological information. The expected result is Negative.  Fact Sheet for Patients:  BloggerCourse.com  Fact Sheet for Healthcare Providers:  [employer]iousBroker.it  This test is no t yet approved or cleared by the [place] FDA and  has been authorized for detection and/or diagnosis of SARS-CoV-2 by FDA under an Emergency Use Authorization (EUA). This EUA will remain  in effect (meaning this test can be used) for the duration of the COVID-19 declaration under Section 564(b)(1) of the  Act, 21 U.S.C.section 360bbb-3(b)(1), unless the authorization is terminated  or revoked sooner.       Influenza A by PCR NEGATIVE NEGATIVE Final   Influenza B by PCR NEGATIVE NEGATIVE Final    Comment: (NOTE) The Xpert Xpress SARS-CoV-2/FLU/RSV plus assay is intended as an aid in the diagnosis of influenza from Nasopharyngeal swab specimens and should not be used as a sole basis for treatment. Nasal washings and aspirates are unacceptable for Xpert Xpress SARS-CoV-2/FLU/RSV testing.  Fact Sheet for Patients: BloggerCourse.com  Fact Sheet for Healthcare Providers: SeriousBroker.it  This test is not yet approved or cleared by the [place] FDA and has been authorized for detection and/or diagnosis of SARS-CoV-2 by FDA under an Emergency Use Authorization (EUA). This EUA will remain in effect (meaning this test can be used) for the duration of the COVID-19 declaration under Section 564(b)(1) of the Act, 21 U.S.C. section 360bbb-3(b)(1), unless the authorization is terminated or revoked.  Performed at [facility], [address] [place], [place] [address]    Time coordinating discharge:   SIGNED:  Standley Dakins, MD  [facility] 11/29/2020, 3:43 PM How to contact the [facility] Attending or Consulting provider 7A - 7P or covering provider during after hours 7P -7A, for this patient?  Check the care team in [facility] and look for a) attending/consulting [facility] provider listed and b) the [facility] team listed Log into [email or web address] and use [facility] universal password to access. If you do not have the password, please contact the hospital operator. Locate the [facility] provider you are looking for under [facility] and page to a number that you can be directly reached. If you still have difficulty reaching the provider, please page the [facility] (Director on Call) for the Hospitalists listed on amion for assistance.

## 2020-11-29 NOTE — Discharge Instructions (Signed)
Please follow up results of EEG with your primary care provider and neurologist.   Please follow up results of MRI with your orthopedist Dr. Romeo Apple.      IMPORTANT INFORMATION: PAY CLOSE ATTENTION   PHYSICIAN DISCHARGE INSTRUCTIONS  Follow with Primary care provider  Lupita Raider, MD  and other consultants as instructed by your Hospitalist Physician  SEEK MEDICAL CARE OR RETURN TO EMERGENCY ROOM IF SYMPTOMS COME BACK, WORSEN OR NEW PROBLEM DEVELOPS   Please note: You were cared for by a hospitalist during your hospital stay. Every effort will be made to forward records to your primary care provider.  You can request that your primary care provider send for your hospital records if they have not received them.  Once you are discharged, your primary care physician will handle any further medical issues. Please note that NO REFILLS for any discharge medications will be authorized once you are discharged, as it is imperative that you return to your primary care physician (or establish a relationship with a primary care physician if you do not have one) for your post hospital discharge needs so that they can reassess your need for medications and monitor your lab values.  Please get a complete blood count and chemistry panel checked by your Primary MD at your next visit, and again as instructed by your Primary MD.  Get Medicines reviewed and adjusted: Please take all your medications with you for your next visit with your Primary MD  Laboratory/radiological data: Please request your Primary MD to go over all hospital tests and procedure/radiological results at the follow up, please ask your primary care provider to get all Hospital records sent to his/her office.  In some cases, they will be blood work, cultures and biopsy results pending at the time of your discharge. Please request that your primary care provider follow up on these results.  If you are diabetic, please bring your blood  sugar readings with you to your follow up appointment with primary care.    Please call and make your follow up appointments as soon as possible.    Also Note the following: If you experience worsening of your admission symptoms, develop shortness of breath, life threatening emergency, suicidal or homicidal thoughts you must seek medical attention immediately by calling 911 or calling your MD immediately  if symptoms less severe.  You must read complete instructions/literature along with all the possible adverse reactions/side effects for all the Medicines you take and that have been prescribed to you. Take any new Medicines after you have completely understood and accpet all the possible adverse reactions/side effects.   Do not drive when taking Pain medications or sleeping medications (Benzodiazepines)  Do not take more than prescribed Pain, Sleep and Anxiety Medications. It is not advisable to combine anxiety,sleep and pain medications without talking with your primary care practitioner  Special Instructions: If you have smoked or chewed Tobacco  in the last 2 yrs please stop smoking, stop any regular Alcohol  and or any Recreational drug use.  Wear Seat belts while driving.  Do not drive if taking any narcotic, mind altering or controlled substances or recreational drugs or alcohol.

## 2020-11-29 NOTE — Evaluation (Signed)
Physical Therapy Evaluation Patient Details Name: Angel Norman MRN: [id number] DOB: December 19, 1932 Today's Date: 11/29/2020  History of Present Illness  KARIM AIELLO is a 87 y.o. male with medical history significant for  type 2 diabetes mellitus, GERD, dyslipidemia and osteoarthritis who presents to the emergency department with a syncopal episode at home.  Patient was unable to provide history, history was obtained from daughter at bedside and granddaughter by phone, per report, patient lives at home with wife who has Alzheimer's disease, so daughter and her 3 daughters (granddaughters to patient) usually check on patient on daily basis.  Today, one of the granddaughters was with patient to help with shower and shaving.  Patient was noted to suddenly slump his head backwards with eyes rolling and unconscious for about 2 minutes during which he had a urinary incontinence, and was confused for about 10 minutes (about the time it took for EMS to arrive) prior to returning to his baseline mental status.  He was also reported to have a loose bowel movement after regaining consciousness and this was associated with diaphoresis, nausea and dry heaves.  He denies headache, fever, chills, chest pain or shortness of breath.   Clinical Impression  Patient functioning near baseline for functional mobility and gait demonstrating good return for ambulation in room without loss of balance, but occasional touching down of left foot without putting his body weight through leg.  Patient encouraged to have nursing staff or family assisting when needing to go to bathroom.  Plan:  Patient discharged from physical therapy to care of nursing for ambulation daily as tolerated for length of stay.         Recommendations for follow up therapy are one component of a multi-disciplinary discharge planning process, led by the attending physician.  Recommendations may be updated based on patient status, additional functional criteria  and insurance authorization.  Follow Up Recommendations Home health PT;Supervision for mobility/OOB;Supervision - Intermittent    Equipment Recommendations  None recommended by PT    Recommendations for Other Services       Precautions / Restrictions Precautions Precautions: Fall Restrictions Weight Bearing Restrictions: Yes LLE Weight Bearing: Non weight bearing      Mobility  Bed Mobility Overal bed mobility: Modified Independent                  Transfers Overall transfer level: Modified independent                  Ambulation/Gait Ambulation/Gait assistance: Supervision;Min guard Gait Distance (Feet): 22 Feet Assistive device: Rolling walker (2 wheeled) Gait Pattern/deviations: Decreased step length - right;Decreased stride length Gait velocity: decreased   General Gait Details: slow slightly labored cadence without loss of balance, fair return for keeping left foot off floor, occasionally touches left foot down  Stairs            Wheelchair Mobility    Modified Rankin (Stroke Patients Only)       Balance Overall balance assessment: Needs assistance Sitting-balance support: Feet supported;No upper extremity supported Sitting balance-Leahy Scale: Good Sitting balance - Comments: seated at EOB   Standing balance support: During functional activity;Bilateral upper extremity supported Standing balance-Leahy Scale: Fair Standing balance comment: using RW with NWB LLE                             Pertinent Vitals/Pain Pain Assessment: No/denies pain    Home Living Family/patient expects to be  discharged to:: Private residence Living Arrangements: Spouse/significant other Available Help at Discharge: Available PRN/intermittently;Family Type of Home: House Home Access: Stairs to enter;Ramped entrance Entrance Stairs-Rails: Right;Left Entrance Stairs-Number of Steps: 3 Home Layout: Multi-level;Able to live on main level with  bedroom/bathroom Home Equipment: Gilford Rile - 2 wheels;Cane - single point;Bedside commode;Shower seat;Grab bars - tub/shower;Grab bars - toilet;Wheelchair - manual      Prior Function Level of Independence: Needs assistance   Gait / Transfers Assistance Needed: very short household distances using RW with NWB LLE, uses wheelchair mostly  ADL's / Homemaking Assistance Needed: assisted by family        Hand Dominance   Dominant Hand: Left    Extremity/Trunk Assessment   Upper Extremity Assessment Upper Extremity Assessment: Overall WFL for tasks assessed    Lower Extremity Assessment Lower Extremity Assessment: Overall WFL for tasks assessed;LLE deficits/detail LLE Deficits / Details: grossly 4-/5 except ankle/foot not tested due splint LLE: Unable to fully assess due to immobilization LLE Sensation: WNL LLE Coordination: WNL    Cervical / Trunk Assessment Cervical / Trunk Assessment: Normal  Communication   Communication: No difficulties  Cognition Arousal/Alertness: Awake/alert Behavior During Therapy: WFL for tasks assessed/performed Overall Cognitive Status: Within Functional Limits for tasks assessed                                        General Comments      Exercises     Assessment/Plan    PT Assessment All further PT needs can be met in the next venue of care  PT Problem List Decreased strength;Decreased activity tolerance;Decreased balance;Decreased mobility       PT Treatment Interventions      PT Goals (Current goals can be found in the Care Plan section)  Acute Rehab PT Goals Patient Stated Goal: return home with family to assist PT Goal Formulation: With patient/family Time For Goal Achievement: 11/29/20 Potential to Achieve Goals: Good    Frequency     Barriers to discharge        Co-evaluation               AM-PAC PT "6 Clicks" Mobility  Outcome Measure Help needed turning from your back to your side while in a  flat bed without using bedrails?: None Help needed moving from lying on your back to sitting on the side of a flat bed without using bedrails?: None Help needed moving to and from a bed to a chair (including a wheelchair)?: A Little Help needed standing up from a chair using your arms (e.g., wheelchair or bedside chair)?: A Little Help needed to walk in hospital room?: A Little Help needed climbing 3-5 steps with a railing? : A Lot 6 Click Score: 19    End of Session   Activity Tolerance: Patient tolerated treatment well;Patient limited by fatigue Patient left: in bed;with call bell/phone within reach;with family/visitor present Nurse Communication: Mobility status PT Visit Diagnosis: Unsteadiness on feet (R26.81);Other abnormalities of gait and mobility (R26.89);Muscle weakness (generalized) (M62.81)    Time: 1531-1550 PT Time Calculation (min) (ACUTE ONLY): 19 min   Charges:   PT Evaluation $PT Eval Low Complexity: 1 Low PT Treatments $Therapeutic Activity: 8-22 mins        4:12 PM, 11/29/20 Lonell Grandchild, MPT Physical Therapist with [facility] [phone] office [phone] mobile phone

## 2020-11-29 NOTE — Progress Notes (Signed)
EEG complete - results pending 

## 2020-11-29 NOTE — TOC Transition Note (Signed)
Transition of Care [facility]) - CM/SW Discharge Note   Patient Details  Name: Angel Norman MRN: [id number] Date of Birth: [birth year]  [facility] CM/SW Contact:  Leitha Bleak, RN Phone Number: 11/29/2020, 4:04 PM   Clinical Narrative:   Patient admitted with Syncope. PT is recommending HHPT. Patient is active with [facility]. MD placed new orders HHPT/OT/RN. Clifton Custard updated.    Final next level of care: Home w Home Health Services Barriers to Discharge: Barriers Resolved   Patient Goals and CMS Choice Patient states their goals for this hospitalization and ongoing recovery are:: to go home. CMS Medicare.gov Compare Post Acute Care list provided to:: Patient Represenative (must comment) Choice offered to / list presented to : Adult Children  Discharge Placement         Name of family member notified: Daughter Patient and family notified of of transfer: 11/29/20  Discharge Plan and Services      HH Arranged: PT, OT, RN [facility] Agency: [facility] Date [facility] Agency Contacted: 11/29/20 Time HH Agency Contacted: [phone] Representative spoke with at [facility] Agency: Clifton Custard  Readmission Risk Interventions No flowsheet data found.

## 2020-11-29 NOTE — Procedures (Signed)
Patient Name: HULON FERRON  MRN: [id number]  Epilepsy Attending: Charlsie Quest  Referring Physician/Provider: Dr Frankey Shown Date: 11/29/2020 Duration: 23.01 mins  Patient history: 87yo M with syncope. EEG to evaluate for seizure.   Level of alertness: Awake  AEDs during EEG study: None  Technical aspects: This EEG study was done with scalp electrodes positioned according to the 10-20 International system of electrode placement. Electrical activity was acquired at a sampling rate of 500Hz  and reviewed with a high frequency filter of 70Hz  and a low frequency filter of 1Hz . EEG data were recorded continuously and digitally stored.   Description: The posterior dominant rhythm consists of 9 Hz activity of moderate voltage (25-35 uV) seen predominantly in posterior head regions, symmetric and reactive to eye opening and eye closing. Hyperventilation and photic stimulation were not performed.     IMPRESSION: This study is within normal limits. No seizures or epileptiform discharges were seen throughout the recording.  Leniya Breit 

## 2020-11-30 DIAGNOSIS — M1990 Unspecified osteoarthritis, unspecified site: Secondary | ICD-10-CM | POA: Diagnosis not present

## 2020-11-30 DIAGNOSIS — E1122 Type 2 diabetes mellitus with diabetic chronic kidney disease: Secondary | ICD-10-CM | POA: Diagnosis not present

## 2020-11-30 DIAGNOSIS — I2510 Atherosclerotic heart disease of native coronary artery without angina pectoris: Secondary | ICD-10-CM | POA: Diagnosis not present

## 2020-11-30 DIAGNOSIS — N189 Chronic kidney disease, unspecified: Secondary | ICD-10-CM | POA: Diagnosis not present

## 2020-11-30 DIAGNOSIS — E7800 Pure hypercholesterolemia, unspecified: Secondary | ICD-10-CM | POA: Diagnosis not present

## 2020-11-30 DIAGNOSIS — L03116 Cellulitis of left lower limb: Secondary | ICD-10-CM | POA: Diagnosis not present

## 2020-11-30 DIAGNOSIS — K219 Gastro-esophageal reflux disease without esophagitis: Secondary | ICD-10-CM | POA: Diagnosis not present

## 2020-11-30 DIAGNOSIS — N400 Enlarged prostate without lower urinary tract symptoms: Secondary | ICD-10-CM | POA: Diagnosis not present

## 2020-11-30 DIAGNOSIS — S82892D Other fracture of left lower leg, subsequent encounter for closed fracture with routine healing: Secondary | ICD-10-CM | POA: Diagnosis not present

## 2020-11-30 NOTE — Telephone Encounter (Signed)
MRI IMPRESSION: 1. Erosion of the posterior malleolus with prominent marrow edema throughout the tibial plafond, concerning for osteomyelitis. Given involvement of the articular surface, tibiotalar septic arthritis is also a concern despite the lack of a significant joint effusion. 2. Subchondral fracture in the lateral talar dome. 3. Superficial ulceration of the medial malleolus.  No abscess. 4. Tendinosis and high-grade partial tear of the posterior tibial tendon.

## 2020-12-01 ENCOUNTER — Ambulatory Visit (HOSPITAL_COMMUNITY): Payer: Medicare PPO

## 2020-12-04 ENCOUNTER — Encounter: Payer: Self-pay | Admitting: Neurology

## 2020-12-04 ENCOUNTER — Ambulatory Visit: Payer: Medicare PPO | Admitting: Neurology

## 2020-12-04 ENCOUNTER — Other Ambulatory Visit: Payer: Self-pay

## 2020-12-04 VITALS — BP 114/73 | HR 88 | Ht 75.0 in | Wt 300.0 lb

## 2020-12-04 DIAGNOSIS — R519 Headache, unspecified: Secondary | ICD-10-CM

## 2020-12-04 DIAGNOSIS — R55 Syncope and collapse: Secondary | ICD-10-CM

## 2020-12-04 NOTE — Patient Instructions (Signed)
Schedule MRI brain with and without contrast  2. Our office will call with results from brain MRI. If normal, we will do a follow-up as needed, please call if any further episodes of loss of consciousness.  3. As per [place] driving laws, no driving after an episode of loss of consciousness until 6 months event-free
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
## 2020-12-05 ENCOUNTER — Ambulatory Visit (HOSPITAL_COMMUNITY): Payer: Medicare PPO

## 2020-12-06 ENCOUNTER — Ambulatory Visit: Payer: Medicare PPO

## 2020-12-06 ENCOUNTER — Ambulatory Visit
Admission: RE | Admit: 2020-12-06 | Discharge: 2020-12-06 | Disposition: A | Discharge status: Home / Self Care | Payer: Self-pay | Source: Ambulatory Visit | Attending: Orthopedic Surgery | Admitting: Orthopedic Surgery

## 2020-12-06 ENCOUNTER — Other Ambulatory Visit: Payer: Self-pay | Admitting: Orthopedic Surgery

## 2020-12-06 ENCOUNTER — Other Ambulatory Visit: Payer: Self-pay | Admitting: Radiology

## 2020-12-06 ENCOUNTER — Other Ambulatory Visit: Payer: Self-pay

## 2020-12-06 ENCOUNTER — Ambulatory Visit (HOSPITAL_COMMUNITY)
Admission: RE | Admit: 2020-12-06 | Discharge: 2020-12-06 | Disposition: A | Discharge status: Home / Self Care | Payer: Medicare PPO | Source: Ambulatory Visit | Attending: Orthopedic Surgery | Admitting: Orthopedic Surgery

## 2020-12-06 ENCOUNTER — Ambulatory Visit (INDEPENDENT_AMBULATORY_CARE_PROVIDER_SITE_OTHER): Payer: Medicare PPO | Admitting: Orthopedic Surgery

## 2020-12-06 VITALS — Wt 300.0 lb

## 2020-12-06 DIAGNOSIS — M65072 Abscess of tendon sheath, left ankle and foot: Secondary | ICD-10-CM

## 2020-12-06 DIAGNOSIS — S82892D Other fracture of left lower leg, subsequent encounter for closed fracture with routine healing: Secondary | ICD-10-CM

## 2020-12-06 DIAGNOSIS — L03119 Cellulitis of unspecified part of limb: Secondary | ICD-10-CM | POA: Insufficient documentation

## 2020-12-06 DIAGNOSIS — M868X6 Other osteomyelitis, lower leg: Secondary | ICD-10-CM | POA: Diagnosis not present

## 2020-12-06 DIAGNOSIS — M869 Osteomyelitis, unspecified: Secondary | ICD-10-CM

## 2020-12-06 DIAGNOSIS — S8292XD Unspecified fracture of left lower leg, subsequent encounter for closed fracture with routine healing: Secondary | ICD-10-CM | POA: Diagnosis not present

## 2020-12-06 MED ORDER — VANCOMYCIN HCL 1000 MG IV SOLR: 1000.0000 mg | INTRAVENOUS | 0 refills | Status: DC

## 2020-12-06 NOTE — Addendum Note (Signed)
Addended byCaffie Damme on: 12/06/2020 02:29 PM   Modules accepted: Orders

## 2020-12-06 NOTE — Progress Notes (Signed)
Peripherally Inserted Central Catheter Placement  The IV Nurse has discussed with the patient and/or persons authorized to consent for the patient, the purpose of this procedure and the potential benefits and risks involved with this procedure.  The benefits include less needle sticks, lab draws from the catheter, and the patient may be discharged home with the catheter. Risks include, but not limited to, infection, bleeding, blood clot (thrombus formation), and puncture of an artery; nerve damage and irregular heartbeat and possibility to perform a PICC exchange if needed/ordered by physician.  Alternatives to this procedure were also discussed.  Bard Power PICC patient education guide, fact sheet on infection prevention and patient information card has been provided to patient /or left at bedside.    PICC Placement Documentation  PICC Single Lumen 12/06/20 Right Basilic 52 cm 1 cm (Active)  Indication for Insertion or Continuance of Line Home intravenous therapies (PICC only) 12/06/20 1433  Exposed Catheter (cm) 1 cm 12/06/20 1433  Site Assessment Clean;Dry;Intact 12/06/20 1433  Line Status Flushed;Saline locked;Blood return noted 12/06/20 1433  Dressing Type Transparent;Securing device 12/06/20 1433  Dressing Status Clean;Dry;Intact 12/06/20 1433  Antimicrobial disc in place? Yes 12/06/20 1433  Line Care Connections checked and tightened 12/06/20 1433  Dressing Intervention New dressing 12/06/20 1433  Dressing Change Due 12/13/20 12/06/20 1433       Bing Plume 12/06/2020, 2:34 PM

## 2020-12-06 NOTE — Progress Notes (Signed)
Chief Complaint  Patient presents with   Results    MRI   Ankle Injury    Left Wound check//DOS 09/13/20   Encounter Diagnoses  Name Primary?   Closed fracture of left ankle with routine healing, subsequent encounter s/o ORIF 09/13/20 Yes   Abscess of tendon sheath of left ankle    Osteomyelitis of left ankle, unspecified type [facility])     [name] is 87 years old he is status post open treatment internal fixation of his left ankle back on July 13.  He had a lateral plate with syndesmosis fixation and a medial deltoid ligament repair with a Arthrex corkscrew anchor  In the postop period he developed medial wound and was treated with oral antibiotics and dressing changes including clindamycin and Bactrim  However he did not improve after 4 to 6-week course of oral medication and dressing changes  He had an MRI which shows a possible osteomyelitis although there is no abscess.  The osteomyelitis appears to be in the posterior malleolus  Risk factors are diabetes  The medial wound is closed but he still has erythema and tenderness medially none laterally that wound looks good  X-rays show the fracture appears to have healed  The MRI was reviewed with its corresponding report and I agree there is inflammation bone edema in the posteromedial tibia most likely this is osteomyelitis based on the overall clinical picture there is also cellulitis  Recommend PICC line with vancomycin start 1 g every 24 plan for 6 weeks of treatment  Last BUN/creatinine were normal

## 2020-12-07 ENCOUNTER — Telehealth: Payer: Self-pay | Admitting: Orthopedic Surgery

## 2020-12-07 DIAGNOSIS — I2510 Atherosclerotic heart disease of native coronary artery without angina pectoris: Secondary | ICD-10-CM | POA: Diagnosis not present

## 2020-12-07 DIAGNOSIS — E1122 Type 2 diabetes mellitus with diabetic chronic kidney disease: Secondary | ICD-10-CM | POA: Diagnosis not present

## 2020-12-07 DIAGNOSIS — N189 Chronic kidney disease, unspecified: Secondary | ICD-10-CM | POA: Diagnosis not present

## 2020-12-07 DIAGNOSIS — E7800 Pure hypercholesterolemia, unspecified: Secondary | ICD-10-CM | POA: Diagnosis not present

## 2020-12-07 DIAGNOSIS — K219 Gastro-esophageal reflux disease without esophagitis: Secondary | ICD-10-CM | POA: Diagnosis not present

## 2020-12-07 DIAGNOSIS — L03116 Cellulitis of left lower limb: Secondary | ICD-10-CM | POA: Diagnosis not present

## 2020-12-07 DIAGNOSIS — S82892D Other fracture of left lower leg, subsequent encounter for closed fracture with routine healing: Secondary | ICD-10-CM | POA: Diagnosis not present

## 2020-12-07 DIAGNOSIS — M1990 Unspecified osteoarthritis, unspecified site: Secondary | ICD-10-CM | POA: Diagnosis not present

## 2020-12-07 DIAGNOSIS — N400 Enlarged prostate without lower urinary tract symptoms: Secondary | ICD-10-CM | POA: Diagnosis not present

## 2020-12-07 DIAGNOSIS — S82832D Other fracture of upper and lower end of left fibula, subsequent encounter for closed fracture with routine healing: Secondary | ICD-10-CM | POA: Diagnosis not present

## 2020-12-07 DIAGNOSIS — M65072 Abscess of tendon sheath, left ankle and foot: Secondary | ICD-10-CM | POA: Diagnosis not present

## 2020-12-07 DIAGNOSIS — M86179 Other acute osteomyelitis, unspecified ankle and foot: Secondary | ICD-10-CM | POA: Diagnosis not present

## 2020-12-07 NOTE — Telephone Encounter (Signed)
Done

## 2020-12-07 NOTE — Telephone Encounter (Signed)
done

## 2020-12-07 NOTE — Telephone Encounter (Signed)
[facility] will get for vanc. For him  How often do you want her to change bandage on his wound?

## 2020-12-07 NOTE — Telephone Encounter (Signed)
Call from patient's daughter/designated contact [name], with questions about IV medication Vancomycin - states [facility] does not carry; referred to [facility], and they do not have it either. Where can patient get medication? Asking also if patch is needed to be changed daily or twice a week? Also boot question - okay to take boot off when in recliner and at night?

## 2020-12-08 DIAGNOSIS — E1169 Type 2 diabetes mellitus with other specified complication: Secondary | ICD-10-CM | POA: Diagnosis not present

## 2020-12-08 DIAGNOSIS — D649 Anemia, unspecified: Secondary | ICD-10-CM | POA: Diagnosis not present

## 2020-12-08 DIAGNOSIS — S82892S Other fracture of left lower leg, sequela: Secondary | ICD-10-CM | POA: Diagnosis not present

## 2020-12-08 DIAGNOSIS — R55 Syncope and collapse: Secondary | ICD-10-CM | POA: Diagnosis not present

## 2020-12-08 DIAGNOSIS — Z452 Encounter for adjustment and management of vascular access device: Secondary | ICD-10-CM | POA: Diagnosis not present

## 2020-12-08 DIAGNOSIS — Z23 Encounter for immunization: Secondary | ICD-10-CM | POA: Diagnosis not present

## 2020-12-08 DIAGNOSIS — T8149XA Infection following a procedure, other surgical site, initial encounter: Secondary | ICD-10-CM | POA: Diagnosis not present

## 2020-12-11 ENCOUNTER — Telehealth: Payer: Self-pay | Admitting: Orthopedic Surgery

## 2020-12-11 DIAGNOSIS — S82892D Other fracture of left lower leg, subsequent encounter for closed fracture with routine healing: Secondary | ICD-10-CM | POA: Diagnosis not present

## 2020-12-11 DIAGNOSIS — K219 Gastro-esophageal reflux disease without esophagitis: Secondary | ICD-10-CM | POA: Diagnosis not present

## 2020-12-11 DIAGNOSIS — N400 Enlarged prostate without lower urinary tract symptoms: Secondary | ICD-10-CM | POA: Diagnosis not present

## 2020-12-11 DIAGNOSIS — E7800 Pure hypercholesterolemia, unspecified: Secondary | ICD-10-CM | POA: Diagnosis not present

## 2020-12-11 DIAGNOSIS — N189 Chronic kidney disease, unspecified: Secondary | ICD-10-CM | POA: Diagnosis not present

## 2020-12-11 DIAGNOSIS — L03116 Cellulitis of left lower limb: Secondary | ICD-10-CM | POA: Diagnosis not present

## 2020-12-11 DIAGNOSIS — E1122 Type 2 diabetes mellitus with diabetic chronic kidney disease: Secondary | ICD-10-CM | POA: Diagnosis not present

## 2020-12-11 DIAGNOSIS — I2510 Atherosclerotic heart disease of native coronary artery without angina pectoris: Secondary | ICD-10-CM | POA: Diagnosis not present

## 2020-12-11 DIAGNOSIS — M1990 Unspecified osteoarthritis, unspecified site: Secondary | ICD-10-CM | POA: Diagnosis not present

## 2020-12-11 NOTE — Telephone Encounter (Signed)
I called Mallorie we discussed And I asked her to have [name] call me

## 2020-12-11 NOTE — Telephone Encounter (Signed)
Mallorie with home care called she will ask [name] to call me back

## 2020-12-11 NOTE — Telephone Encounter (Signed)
I called [name] to tell her to change dressing 2 times weekly or when soiled Boot should stay on since he is confused to keep him from getting up during the night with it on and he Can d/c oral antibiotics since he is on IV antibiotics.   Left message for her to call me back.

## 2020-12-11 NOTE — Telephone Encounter (Signed)
Patient awaiting clarification - also received call from patient's primary care office, Dr Sherryll Burger, asking if we can let patient/family know if he is to stop the oral antibiotics, and continue with Vancomycin only?

## 2020-12-11 NOTE — Telephone Encounter (Signed)
Malori,   Needs to know what is the patients weight barring.  She is to see him first thing in the AM  Please call her back at [phone]

## 2020-12-11 NOTE — Telephone Encounter (Signed)
I called her we discussed She voiced understanding

## 2020-12-12 ENCOUNTER — Encounter: Payer: Self-pay | Admitting: Orthopedic Surgery

## 2020-12-12 DIAGNOSIS — K219 Gastro-esophageal reflux disease without esophagitis: Secondary | ICD-10-CM | POA: Diagnosis not present

## 2020-12-12 DIAGNOSIS — E1122 Type 2 diabetes mellitus with diabetic chronic kidney disease: Secondary | ICD-10-CM | POA: Diagnosis not present

## 2020-12-12 DIAGNOSIS — I2510 Atherosclerotic heart disease of native coronary artery without angina pectoris: Secondary | ICD-10-CM | POA: Diagnosis not present

## 2020-12-12 DIAGNOSIS — N400 Enlarged prostate without lower urinary tract symptoms: Secondary | ICD-10-CM | POA: Diagnosis not present

## 2020-12-12 DIAGNOSIS — M1990 Unspecified osteoarthritis, unspecified site: Secondary | ICD-10-CM | POA: Diagnosis not present

## 2020-12-12 DIAGNOSIS — L03116 Cellulitis of left lower limb: Secondary | ICD-10-CM | POA: Diagnosis not present

## 2020-12-12 DIAGNOSIS — S82892D Other fracture of left lower leg, subsequent encounter for closed fracture with routine healing: Secondary | ICD-10-CM | POA: Diagnosis not present

## 2020-12-12 DIAGNOSIS — E7800 Pure hypercholesterolemia, unspecified: Secondary | ICD-10-CM | POA: Diagnosis not present

## 2020-12-12 DIAGNOSIS — N189 Chronic kidney disease, unspecified: Secondary | ICD-10-CM | POA: Diagnosis not present

## 2020-12-13 ENCOUNTER — Telehealth: Payer: Self-pay | Admitting: Orthopedic Surgery

## 2020-12-13 DIAGNOSIS — U071 COVID-19: Secondary | ICD-10-CM | POA: Diagnosis not present

## 2020-12-13 DIAGNOSIS — E1169 Type 2 diabetes mellitus with other specified complication: Secondary | ICD-10-CM | POA: Diagnosis not present

## 2020-12-13 NOTE — Telephone Encounter (Signed)
MALORI WITH [facility] CALLED AND SAID SHE NEEDS A VERBAL ON THIS PATIENT.    PLEASE CALL HER BACK AT [phone]

## 2020-12-13 NOTE — Telephone Encounter (Signed)
Called to give her verbal orders.

## 2020-12-14 DIAGNOSIS — N189 Chronic kidney disease, unspecified: Secondary | ICD-10-CM | POA: Diagnosis not present

## 2020-12-14 DIAGNOSIS — E1122 Type 2 diabetes mellitus with diabetic chronic kidney disease: Secondary | ICD-10-CM | POA: Diagnosis not present

## 2020-12-14 DIAGNOSIS — S82892D Other fracture of left lower leg, subsequent encounter for closed fracture with routine healing: Secondary | ICD-10-CM | POA: Diagnosis not present

## 2020-12-14 DIAGNOSIS — M86179 Other acute osteomyelitis, unspecified ankle and foot: Secondary | ICD-10-CM | POA: Diagnosis not present

## 2020-12-14 DIAGNOSIS — L03116 Cellulitis of left lower limb: Secondary | ICD-10-CM | POA: Diagnosis not present

## 2020-12-14 DIAGNOSIS — I2510 Atherosclerotic heart disease of native coronary artery without angina pectoris: Secondary | ICD-10-CM | POA: Diagnosis not present

## 2020-12-14 DIAGNOSIS — M1990 Unspecified osteoarthritis, unspecified site: Secondary | ICD-10-CM | POA: Diagnosis not present

## 2020-12-14 DIAGNOSIS — K219 Gastro-esophageal reflux disease without esophagitis: Secondary | ICD-10-CM | POA: Diagnosis not present

## 2020-12-14 DIAGNOSIS — E7800 Pure hypercholesterolemia, unspecified: Secondary | ICD-10-CM | POA: Diagnosis not present

## 2020-12-14 DIAGNOSIS — M65072 Abscess of tendon sheath, left ankle and foot: Secondary | ICD-10-CM | POA: Diagnosis not present

## 2020-12-14 DIAGNOSIS — N400 Enlarged prostate without lower urinary tract symptoms: Secondary | ICD-10-CM | POA: Diagnosis not present

## 2020-12-14 DIAGNOSIS — S82832D Other fracture of upper and lower end of left fibula, subsequent encounter for closed fracture with routine healing: Secondary | ICD-10-CM | POA: Diagnosis not present

## 2020-12-18 ENCOUNTER — Telehealth: Payer: Self-pay | Admitting: Orthopedic Surgery

## 2020-12-18 ENCOUNTER — Other Ambulatory Visit: Payer: Self-pay | Admitting: Orthopedic Surgery

## 2020-12-18 ENCOUNTER — Telehealth: Payer: Self-pay | Admitting: Radiology

## 2020-12-18 ENCOUNTER — Encounter: Payer: Self-pay | Admitting: Orthopedic Surgery

## 2020-12-18 DIAGNOSIS — N400 Enlarged prostate without lower urinary tract symptoms: Secondary | ICD-10-CM | POA: Diagnosis not present

## 2020-12-18 DIAGNOSIS — K219 Gastro-esophageal reflux disease without esophagitis: Secondary | ICD-10-CM | POA: Diagnosis not present

## 2020-12-18 DIAGNOSIS — I2510 Atherosclerotic heart disease of native coronary artery without angina pectoris: Secondary | ICD-10-CM | POA: Diagnosis not present

## 2020-12-18 DIAGNOSIS — M1990 Unspecified osteoarthritis, unspecified site: Secondary | ICD-10-CM | POA: Diagnosis not present

## 2020-12-18 DIAGNOSIS — L03116 Cellulitis of left lower limb: Secondary | ICD-10-CM | POA: Diagnosis not present

## 2020-12-18 DIAGNOSIS — E1122 Type 2 diabetes mellitus with diabetic chronic kidney disease: Secondary | ICD-10-CM | POA: Diagnosis not present

## 2020-12-18 DIAGNOSIS — S82892D Other fracture of left lower leg, subsequent encounter for closed fracture with routine healing: Secondary | ICD-10-CM | POA: Diagnosis not present

## 2020-12-18 DIAGNOSIS — N189 Chronic kidney disease, unspecified: Secondary | ICD-10-CM | POA: Diagnosis not present

## 2020-12-18 DIAGNOSIS — E7800 Pure hypercholesterolemia, unspecified: Secondary | ICD-10-CM | POA: Diagnosis not present

## 2020-12-18 NOTE — Telephone Encounter (Signed)
Call received from [facility] nurse, who called from patient's home - please call home phone#[phone]. Relays that patient's PICC lind is no longer returning blood; said no sign of swelling or other issues - please advise.

## 2020-12-18 NOTE — Telephone Encounter (Signed)
Voice message from [facility] about patient's appt, need to move it to earlier.  Patient gets his Abx between 2-3 pm.   Also new Dx Covid, and taking meds, need to discuss with her regarding that as far as appt. I called back and LM asking her to call back and ask for me.

## 2020-12-18 NOTE — Telephone Encounter (Signed)
PICC line not working  Ok to send to hospital for exchange?

## 2020-12-18 NOTE — Addendum Note (Signed)
Addended byCaffie Damme on: 12/18/2020 04:51 PM   Modules accepted: Orders, SmartSet

## 2020-12-18 NOTE — Telephone Encounter (Signed)
I spoke to [facility] she said she prefers to flush the picc line with anti clotting agent instead of exchange. Dr Romeo Apple has put in orders for this [facility]) said to call Eber Jones to schedule a time, will have to be tomorrow they do not have a nurse to do it today  I have messaged Eber Jones and Selena Batten to see if he can get in tomorrow early am   Called his daughter to advise, she is aware to await my phone call in the am  I left message in for the PICC exchange in case they can not get line flushed but they would like to try to flush it first.

## 2020-12-19 ENCOUNTER — Encounter: Payer: Self-pay | Admitting: Neurology

## 2020-12-19 ENCOUNTER — Encounter (HOSPITAL_COMMUNITY): Payer: Medicare PPO

## 2020-12-19 ENCOUNTER — Telehealth: Payer: Self-pay | Admitting: Orthopedic Surgery

## 2020-12-19 MED FILL — Perflutren Lipid Microsphere IV Susp 1.1 MG/ML: INTRAVENOUS | Qty: 10 | Status: AC

## 2020-12-19 NOTE — Telephone Encounter (Signed)
[facility] pharmacy is dosing the Vanc not Dr Romeo Apple  She needs to contact the pharmacy  The phone number in this message is incorrect I dont know number to call.   Okey Regal has advised it is [phone] / I called spoke to [place] and Christella Scheuermann is the pharmacist and Dorathy Daft will advise Ada we want pharmacy to adjust the dose, and they will take care of it.   Nothing further needed at this time To Dr Fran Lowes

## 2020-12-19 NOTE — Telephone Encounter (Addendum)
Voice message received (call at 11:00am) from [facility], per Ada(?spelling) with "critiical labs"- please call [phone]. Message states Vancomycin came back at 5.5. States would like to adjust dose. Please advise.

## 2020-12-19 NOTE — Telephone Encounter (Signed)
Called daughter to advise. He can go today at noon to have the PICC line flushed She states the nurse came back out late last night and was able to get the line to work  I told her to check with him and let me know if it is doing okay  If not I will have to send him for flush If okay will call to cancel.

## 2020-12-19 NOTE — Telephone Encounter (Signed)
To you FYI Nurse did get line flushed out and working last night It feels fine today  Cancelled the PICC flush/ vs exchange of PICC  If he has any further problems daughter and nurse will let us know  Daughter asking for Prayers for him and family.

## 2020-12-21 ENCOUNTER — Encounter: Payer: Self-pay | Admitting: Orthopedic Surgery

## 2020-12-21 ENCOUNTER — Other Ambulatory Visit: Payer: Self-pay

## 2020-12-21 ENCOUNTER — Ambulatory Visit (INDEPENDENT_AMBULATORY_CARE_PROVIDER_SITE_OTHER): Payer: Medicare PPO | Admitting: Orthopedic Surgery

## 2020-12-21 DIAGNOSIS — M86172 Other acute osteomyelitis, left ankle and foot: Secondary | ICD-10-CM | POA: Diagnosis not present

## 2020-12-21 DIAGNOSIS — K219 Gastro-esophageal reflux disease without esophagitis: Secondary | ICD-10-CM | POA: Diagnosis not present

## 2020-12-21 DIAGNOSIS — M869 Osteomyelitis, unspecified: Secondary | ICD-10-CM

## 2020-12-21 DIAGNOSIS — I2510 Atherosclerotic heart disease of native coronary artery without angina pectoris: Secondary | ICD-10-CM | POA: Diagnosis not present

## 2020-12-21 DIAGNOSIS — S82892D Other fracture of left lower leg, subsequent encounter for closed fracture with routine healing: Secondary | ICD-10-CM

## 2020-12-21 DIAGNOSIS — M1990 Unspecified osteoarthritis, unspecified site: Secondary | ICD-10-CM | POA: Diagnosis not present

## 2020-12-21 DIAGNOSIS — N189 Chronic kidney disease, unspecified: Secondary | ICD-10-CM | POA: Diagnosis not present

## 2020-12-21 DIAGNOSIS — N400 Enlarged prostate without lower urinary tract symptoms: Secondary | ICD-10-CM | POA: Diagnosis not present

## 2020-12-21 DIAGNOSIS — L03116 Cellulitis of left lower limb: Secondary | ICD-10-CM

## 2020-12-21 DIAGNOSIS — E1122 Type 2 diabetes mellitus with diabetic chronic kidney disease: Secondary | ICD-10-CM | POA: Diagnosis not present

## 2020-12-21 DIAGNOSIS — E7800 Pure hypercholesterolemia, unspecified: Secondary | ICD-10-CM | POA: Diagnosis not present

## 2020-12-21 NOTE — Progress Notes (Addendum)
Follow-up appointment  Chief Complaint  Patient presents with   Post-op Problem    Left ankle osteomyelitis  S/p ORIF 09/13/20     Encounter Diagnoses  Name Primary?   Closed fracture of left ankle with routine healing, subsequent encounter s/o ORIF 09/13/20    Osteomyelitis of left ankle, unspecified type (HCC) Yes   Cellulitis of left ankle     87 year old male had ORIF of his left ankle with lateral plate fixation and medial deltoid ligament repair, developed a medial cellulitis.  He had an MRI of the ankle to look for an abscess although no abscess was found he did have what appeared to be osteomyelitis of the posterior tibia.  He was sent to get a PICC line and is now on vancomycin  Is here for wound check  I took pictures of his wound and his wound looks better.  He has less cellulitis less swelling less tenderness  Addendum will be made regarding his laboratory studies  His initial 1000 mg dose of vancomycin was increased due to low vancomycin level  Current white count is in the 5.8-6.1 range his glucose has been trending at 100 which is much better  He is to be weightbearing as tolerated in the boot with a walker  Follow-up in 2 weeks continue vancomycin for 4 weeks  Addendum dated October 27 at 12 PM I am updating the lab results from [facility] starting with    October 11 white count 6.1 hemoglobin 11.9  BUN/creatinine 16 and 1.02 with a GFR of 72  October 17 white count 5.8 BUN/creatinine 16 and 0.99 with a 75 GFR  October 24 CBC white count 5.7

## 2020-12-22 ENCOUNTER — Telehealth: Payer: Self-pay | Admitting: Radiology

## 2020-12-22 NOTE — Telephone Encounter (Signed)
Patient declined physical therapy when they came to home, they will not go back  To you FYI

## 2020-12-25 ENCOUNTER — Encounter: Payer: Self-pay | Admitting: Orthopedic Surgery

## 2020-12-25 DIAGNOSIS — L03116 Cellulitis of left lower limb: Secondary | ICD-10-CM | POA: Diagnosis not present

## 2020-12-25 DIAGNOSIS — K219 Gastro-esophageal reflux disease without esophagitis: Secondary | ICD-10-CM | POA: Diagnosis not present

## 2020-12-25 DIAGNOSIS — E7800 Pure hypercholesterolemia, unspecified: Secondary | ICD-10-CM | POA: Diagnosis not present

## 2020-12-25 DIAGNOSIS — N400 Enlarged prostate without lower urinary tract symptoms: Secondary | ICD-10-CM | POA: Diagnosis not present

## 2020-12-25 DIAGNOSIS — S82892D Other fracture of left lower leg, subsequent encounter for closed fracture with routine healing: Secondary | ICD-10-CM | POA: Diagnosis not present

## 2020-12-25 DIAGNOSIS — M1990 Unspecified osteoarthritis, unspecified site: Secondary | ICD-10-CM | POA: Diagnosis not present

## 2020-12-25 DIAGNOSIS — N189 Chronic kidney disease, unspecified: Secondary | ICD-10-CM | POA: Diagnosis not present

## 2020-12-25 DIAGNOSIS — I2510 Atherosclerotic heart disease of native coronary artery without angina pectoris: Secondary | ICD-10-CM | POA: Diagnosis not present

## 2020-12-25 DIAGNOSIS — E1122 Type 2 diabetes mellitus with diabetic chronic kidney disease: Secondary | ICD-10-CM | POA: Diagnosis not present

## 2020-12-28 ENCOUNTER — Encounter: Payer: Self-pay | Admitting: Orthopedic Surgery

## 2020-12-28 DIAGNOSIS — I2510 Atherosclerotic heart disease of native coronary artery without angina pectoris: Secondary | ICD-10-CM | POA: Diagnosis not present

## 2020-12-28 DIAGNOSIS — E7800 Pure hypercholesterolemia, unspecified: Secondary | ICD-10-CM | POA: Diagnosis not present

## 2020-12-28 DIAGNOSIS — N400 Enlarged prostate without lower urinary tract symptoms: Secondary | ICD-10-CM | POA: Diagnosis not present

## 2020-12-28 DIAGNOSIS — S82892D Other fracture of left lower leg, subsequent encounter for closed fracture with routine healing: Secondary | ICD-10-CM | POA: Diagnosis not present

## 2020-12-28 DIAGNOSIS — E1122 Type 2 diabetes mellitus with diabetic chronic kidney disease: Secondary | ICD-10-CM | POA: Diagnosis not present

## 2020-12-28 DIAGNOSIS — M1990 Unspecified osteoarthritis, unspecified site: Secondary | ICD-10-CM | POA: Diagnosis not present

## 2020-12-28 DIAGNOSIS — N189 Chronic kidney disease, unspecified: Secondary | ICD-10-CM | POA: Diagnosis not present

## 2020-12-28 DIAGNOSIS — K219 Gastro-esophageal reflux disease without esophagitis: Secondary | ICD-10-CM | POA: Diagnosis not present

## 2020-12-28 DIAGNOSIS — L03116 Cellulitis of left lower limb: Secondary | ICD-10-CM | POA: Diagnosis not present

## 2021-01-01 ENCOUNTER — Encounter: Payer: Self-pay | Admitting: Orthopedic Surgery

## 2021-01-01 DIAGNOSIS — I2510 Atherosclerotic heart disease of native coronary artery without angina pectoris: Secondary | ICD-10-CM | POA: Diagnosis not present

## 2021-01-01 DIAGNOSIS — N400 Enlarged prostate without lower urinary tract symptoms: Secondary | ICD-10-CM | POA: Diagnosis not present

## 2021-01-01 DIAGNOSIS — E7800 Pure hypercholesterolemia, unspecified: Secondary | ICD-10-CM | POA: Diagnosis not present

## 2021-01-01 DIAGNOSIS — S82892D Other fracture of left lower leg, subsequent encounter for closed fracture with routine healing: Secondary | ICD-10-CM | POA: Diagnosis not present

## 2021-01-01 DIAGNOSIS — N189 Chronic kidney disease, unspecified: Secondary | ICD-10-CM | POA: Diagnosis not present

## 2021-01-01 DIAGNOSIS — L03116 Cellulitis of left lower limb: Secondary | ICD-10-CM | POA: Diagnosis not present

## 2021-01-01 DIAGNOSIS — M1990 Unspecified osteoarthritis, unspecified site: Secondary | ICD-10-CM | POA: Diagnosis not present

## 2021-01-01 DIAGNOSIS — K219 Gastro-esophageal reflux disease without esophagitis: Secondary | ICD-10-CM | POA: Diagnosis not present

## 2021-01-01 DIAGNOSIS — E1122 Type 2 diabetes mellitus with diabetic chronic kidney disease: Secondary | ICD-10-CM | POA: Diagnosis not present

## 2021-01-02 ENCOUNTER — Telehealth: Payer: Self-pay | Admitting: Physician Assistant

## 2021-01-02 NOTE — Telephone Encounter (Signed)
Done

## 2021-01-02 NOTE — Telephone Encounter (Signed)
Called with verbal orders.  

## 2021-01-02 NOTE — Telephone Encounter (Signed)
Call received from Joey, physical therapist, [facility] ph [phone] - requests verbal orders for:  - 1 time a week for 1 week  - 1 time every 2 weeks for 4 weeks      For gait training and endurance

## 2021-01-04 ENCOUNTER — Other Ambulatory Visit: Payer: Self-pay

## 2021-01-04 ENCOUNTER — Encounter: Payer: Self-pay | Admitting: Orthopedic Surgery

## 2021-01-04 ENCOUNTER — Ambulatory Visit (INDEPENDENT_AMBULATORY_CARE_PROVIDER_SITE_OTHER): Payer: Medicare PPO | Admitting: Orthopedic Surgery

## 2021-01-04 DIAGNOSIS — K219 Gastro-esophageal reflux disease without esophagitis: Secondary | ICD-10-CM | POA: Diagnosis not present

## 2021-01-04 DIAGNOSIS — H9190 Unspecified hearing loss, unspecified ear: Secondary | ICD-10-CM | POA: Insufficient documentation

## 2021-01-04 DIAGNOSIS — R609 Edema, unspecified: Secondary | ICD-10-CM | POA: Insufficient documentation

## 2021-01-04 DIAGNOSIS — E1169 Type 2 diabetes mellitus with other specified complication: Secondary | ICD-10-CM | POA: Insufficient documentation

## 2021-01-04 DIAGNOSIS — N189 Chronic kidney disease, unspecified: Secondary | ICD-10-CM | POA: Diagnosis not present

## 2021-01-04 DIAGNOSIS — N400 Enlarged prostate without lower urinary tract symptoms: Secondary | ICD-10-CM | POA: Diagnosis not present

## 2021-01-04 DIAGNOSIS — L03116 Cellulitis of left lower limb: Secondary | ICD-10-CM

## 2021-01-04 DIAGNOSIS — I2510 Atherosclerotic heart disease of native coronary artery without angina pectoris: Secondary | ICD-10-CM | POA: Diagnosis not present

## 2021-01-04 DIAGNOSIS — M1990 Unspecified osteoarthritis, unspecified site: Secondary | ICD-10-CM | POA: Diagnosis not present

## 2021-01-04 DIAGNOSIS — S82892D Other fracture of left lower leg, subsequent encounter for closed fracture with routine healing: Secondary | ICD-10-CM

## 2021-01-04 DIAGNOSIS — M869 Osteomyelitis, unspecified: Secondary | ICD-10-CM | POA: Diagnosis not present

## 2021-01-04 DIAGNOSIS — E7800 Pure hypercholesterolemia, unspecified: Secondary | ICD-10-CM | POA: Diagnosis not present

## 2021-01-04 DIAGNOSIS — E1122 Type 2 diabetes mellitus with diabetic chronic kidney disease: Secondary | ICD-10-CM | POA: Diagnosis not present

## 2021-01-04 NOTE — Progress Notes (Addendum)
Chief Complaint  Patient presents with   Ankle Problem    Left ankle ORIF 09/13/20 / walking better wound closing / still using IV ABX states feeling better    Last lab work looked good   Continue iv vanc   Wound looks good   The erythema seems to be dependent rubor   Cam walker   Iv vanc   Fu 2 weeks   Encounter Diagnoses  Name Primary?   Closed fracture of left ankle with routine healing, subsequent encounter s/o ORIF 09/13/20 Yes   Osteomyelitis of left ankle, unspecified type (HCC)    Cellulitis of left ankle    Addendum  Lab reports :  3:24 PM  Vanc tr on 10/31 == 6.0               On 10/27 = 5.7

## 2021-01-08 ENCOUNTER — Encounter: Payer: Self-pay | Admitting: Orthopedic Surgery

## 2021-01-08 DIAGNOSIS — I2510 Atherosclerotic heart disease of native coronary artery without angina pectoris: Secondary | ICD-10-CM | POA: Diagnosis not present

## 2021-01-08 DIAGNOSIS — E7800 Pure hypercholesterolemia, unspecified: Secondary | ICD-10-CM | POA: Diagnosis not present

## 2021-01-08 DIAGNOSIS — N400 Enlarged prostate without lower urinary tract symptoms: Secondary | ICD-10-CM | POA: Diagnosis not present

## 2021-01-08 DIAGNOSIS — M1990 Unspecified osteoarthritis, unspecified site: Secondary | ICD-10-CM | POA: Diagnosis not present

## 2021-01-08 DIAGNOSIS — N189 Chronic kidney disease, unspecified: Secondary | ICD-10-CM | POA: Diagnosis not present

## 2021-01-08 DIAGNOSIS — K219 Gastro-esophageal reflux disease without esophagitis: Secondary | ICD-10-CM | POA: Diagnosis not present

## 2021-01-08 DIAGNOSIS — S82892D Other fracture of left lower leg, subsequent encounter for closed fracture with routine healing: Secondary | ICD-10-CM | POA: Diagnosis not present

## 2021-01-08 DIAGNOSIS — L03116 Cellulitis of left lower limb: Secondary | ICD-10-CM | POA: Diagnosis not present

## 2021-01-08 DIAGNOSIS — E1122 Type 2 diabetes mellitus with diabetic chronic kidney disease: Secondary | ICD-10-CM | POA: Diagnosis not present

## 2021-01-11 ENCOUNTER — Encounter: Payer: Self-pay | Admitting: Orthopedic Surgery

## 2021-01-11 DIAGNOSIS — M1990 Unspecified osteoarthritis, unspecified site: Secondary | ICD-10-CM | POA: Diagnosis not present

## 2021-01-11 DIAGNOSIS — L03116 Cellulitis of left lower limb: Secondary | ICD-10-CM | POA: Diagnosis not present

## 2021-01-11 DIAGNOSIS — E1122 Type 2 diabetes mellitus with diabetic chronic kidney disease: Secondary | ICD-10-CM | POA: Diagnosis not present

## 2021-01-11 DIAGNOSIS — K219 Gastro-esophageal reflux disease without esophagitis: Secondary | ICD-10-CM | POA: Diagnosis not present

## 2021-01-11 DIAGNOSIS — N189 Chronic kidney disease, unspecified: Secondary | ICD-10-CM | POA: Diagnosis not present

## 2021-01-11 DIAGNOSIS — E7800 Pure hypercholesterolemia, unspecified: Secondary | ICD-10-CM | POA: Diagnosis not present

## 2021-01-11 DIAGNOSIS — I2510 Atherosclerotic heart disease of native coronary artery without angina pectoris: Secondary | ICD-10-CM | POA: Diagnosis not present

## 2021-01-11 DIAGNOSIS — N400 Enlarged prostate without lower urinary tract symptoms: Secondary | ICD-10-CM | POA: Diagnosis not present

## 2021-01-11 DIAGNOSIS — S82892D Other fracture of left lower leg, subsequent encounter for closed fracture with routine healing: Secondary | ICD-10-CM | POA: Diagnosis not present

## 2021-01-11 NOTE — Progress Notes (Signed)
Lab results update  Date of collection January 04, 2021 sed rate 52  C-reactive protein 7  Vancomycin trough 6.3

## 2021-01-15 DIAGNOSIS — S82892D Other fracture of left lower leg, subsequent encounter for closed fracture with routine healing: Secondary | ICD-10-CM | POA: Diagnosis not present

## 2021-01-15 DIAGNOSIS — N189 Chronic kidney disease, unspecified: Secondary | ICD-10-CM | POA: Diagnosis not present

## 2021-01-15 DIAGNOSIS — E1122 Type 2 diabetes mellitus with diabetic chronic kidney disease: Secondary | ICD-10-CM | POA: Diagnosis not present

## 2021-01-15 DIAGNOSIS — M1990 Unspecified osteoarthritis, unspecified site: Secondary | ICD-10-CM | POA: Diagnosis not present

## 2021-01-15 DIAGNOSIS — L03116 Cellulitis of left lower limb: Secondary | ICD-10-CM | POA: Diagnosis not present

## 2021-01-15 DIAGNOSIS — I2510 Atherosclerotic heart disease of native coronary artery without angina pectoris: Secondary | ICD-10-CM | POA: Diagnosis not present

## 2021-01-15 DIAGNOSIS — E7800 Pure hypercholesterolemia, unspecified: Secondary | ICD-10-CM | POA: Diagnosis not present

## 2021-01-15 DIAGNOSIS — K219 Gastro-esophageal reflux disease without esophagitis: Secondary | ICD-10-CM | POA: Diagnosis not present

## 2021-01-15 DIAGNOSIS — N400 Enlarged prostate without lower urinary tract symptoms: Secondary | ICD-10-CM | POA: Diagnosis not present

## 2021-01-16 ENCOUNTER — Telehealth: Payer: Self-pay | Admitting: Orthopedic Surgery

## 2021-01-16 DIAGNOSIS — N189 Chronic kidney disease, unspecified: Secondary | ICD-10-CM | POA: Diagnosis not present

## 2021-01-16 DIAGNOSIS — I2510 Atherosclerotic heart disease of native coronary artery without angina pectoris: Secondary | ICD-10-CM | POA: Diagnosis not present

## 2021-01-16 DIAGNOSIS — L03116 Cellulitis of left lower limb: Secondary | ICD-10-CM | POA: Diagnosis not present

## 2021-01-16 DIAGNOSIS — K219 Gastro-esophageal reflux disease without esophagitis: Secondary | ICD-10-CM | POA: Diagnosis not present

## 2021-01-16 DIAGNOSIS — S82892D Other fracture of left lower leg, subsequent encounter for closed fracture with routine healing: Secondary | ICD-10-CM | POA: Diagnosis not present

## 2021-01-16 DIAGNOSIS — E1122 Type 2 diabetes mellitus with diabetic chronic kidney disease: Secondary | ICD-10-CM | POA: Diagnosis not present

## 2021-01-16 DIAGNOSIS — M1990 Unspecified osteoarthritis, unspecified site: Secondary | ICD-10-CM | POA: Diagnosis not present

## 2021-01-16 DIAGNOSIS — E7800 Pure hypercholesterolemia, unspecified: Secondary | ICD-10-CM | POA: Diagnosis not present

## 2021-01-16 DIAGNOSIS — N400 Enlarged prostate without lower urinary tract symptoms: Secondary | ICD-10-CM | POA: Diagnosis not present

## 2021-01-16 NOTE — Telephone Encounter (Signed)
Angel Norman called and has some questions about her dads medicine.  He runs out of one RX today and the pharmacy won't refill it, because he has no refills on it.    She would like a call back, she has questions about his medicine.   She knows he has an appt this Thursday   Angel Norman number is [phone]

## 2021-01-17 ENCOUNTER — Encounter: Payer: Self-pay | Admitting: Orthopedic Surgery

## 2021-01-17 DIAGNOSIS — R809 Proteinuria, unspecified: Secondary | ICD-10-CM | POA: Diagnosis not present

## 2021-01-17 DIAGNOSIS — Z452 Encounter for adjustment and management of vascular access device: Secondary | ICD-10-CM | POA: Diagnosis not present

## 2021-01-17 DIAGNOSIS — E782 Mixed hyperlipidemia: Secondary | ICD-10-CM | POA: Diagnosis not present

## 2021-01-17 DIAGNOSIS — M869 Osteomyelitis, unspecified: Secondary | ICD-10-CM | POA: Diagnosis not present

## 2021-01-17 DIAGNOSIS — E1169 Type 2 diabetes mellitus with other specified complication: Secondary | ICD-10-CM | POA: Diagnosis not present

## 2021-01-17 NOTE — Telephone Encounter (Signed)
[phone]? [phone] Tried both, no answer at either, also called his home no answer.

## 2021-01-17 NOTE — Progress Notes (Signed)
Lab update  Collected 01/11/21  Esr 58  Crp 7

## 2021-01-17 NOTE — Telephone Encounter (Signed)
I called to see what he needs Phone she gave rings with no answer Phone at his home rings with no answer.  Just need to see which med he needs

## 2021-01-17 NOTE — Progress Notes (Signed)
Lab update  01/08/21  Wbc 4.3 Glu 107 Bun/cr 12/.98 Gfr 76 Tot prot 6.5

## 2021-01-18 ENCOUNTER — Other Ambulatory Visit: Payer: Self-pay

## 2021-01-18 ENCOUNTER — Encounter: Payer: Self-pay | Admitting: Orthopedic Surgery

## 2021-01-18 ENCOUNTER — Telehealth: Payer: Self-pay | Admitting: Orthopedic Surgery

## 2021-01-18 ENCOUNTER — Ambulatory Visit (INDEPENDENT_AMBULATORY_CARE_PROVIDER_SITE_OTHER): Payer: Medicare PPO | Admitting: Orthopedic Surgery

## 2021-01-18 DIAGNOSIS — N189 Chronic kidney disease, unspecified: Secondary | ICD-10-CM | POA: Diagnosis not present

## 2021-01-18 DIAGNOSIS — M869 Osteomyelitis, unspecified: Secondary | ICD-10-CM | POA: Diagnosis not present

## 2021-01-18 DIAGNOSIS — N400 Enlarged prostate without lower urinary tract symptoms: Secondary | ICD-10-CM | POA: Diagnosis not present

## 2021-01-18 DIAGNOSIS — E1122 Type 2 diabetes mellitus with diabetic chronic kidney disease: Secondary | ICD-10-CM | POA: Diagnosis not present

## 2021-01-18 DIAGNOSIS — S82892D Other fracture of left lower leg, subsequent encounter for closed fracture with routine healing: Secondary | ICD-10-CM | POA: Diagnosis not present

## 2021-01-18 DIAGNOSIS — K219 Gastro-esophageal reflux disease without esophagitis: Secondary | ICD-10-CM | POA: Diagnosis not present

## 2021-01-18 DIAGNOSIS — I2510 Atherosclerotic heart disease of native coronary artery without angina pectoris: Secondary | ICD-10-CM | POA: Diagnosis not present

## 2021-01-18 DIAGNOSIS — E7800 Pure hypercholesterolemia, unspecified: Secondary | ICD-10-CM | POA: Diagnosis not present

## 2021-01-18 DIAGNOSIS — M1990 Unspecified osteoarthritis, unspecified site: Secondary | ICD-10-CM | POA: Diagnosis not present

## 2021-01-18 DIAGNOSIS — L03116 Cellulitis of left lower limb: Secondary | ICD-10-CM | POA: Diagnosis not present

## 2021-01-18 NOTE — Telephone Encounter (Signed)
Done

## 2021-01-18 NOTE — Telephone Encounter (Signed)
Call received from [facility] care nurse, Tinnie Gens - states he is with patient now (3:49pm), asking if Dr Romeo Apple would like to have blood work drawn for this week? Please advise: PH# [phone]

## 2021-01-18 NOTE — Telephone Encounter (Signed)
I gave him the orders and had him add on a CRP and Sed Rate  He will go out Monday to draw the labs.  To you FYI

## 2021-01-18 NOTE — Progress Notes (Signed)
Chief Complaint  Patient presents with   Post-op Follow-up    Left ankle 09/13/20/ Ran out of Vancomycin yesterday     Encounter Diagnoses  Name Primary?   Closed fracture of left ankle with routine healing, subsequent encounter s/o ORIF 09/13/20 Yes   Osteomyelitis of left ankle, unspecified type [facility])    87 year old male he is completed his 6 weeks of IV vancomycin his most recent sed rate was 60 CRP was 7  His ankle shows mild redness over the medial side some swelling but no tenderness no pain  I am going to try a 1 week trial off antibiotics I put him in an ASO brace I will keep his PICC line and I will see him next Wednesday to see if we need to continue the antibiotics  We may take an x-ray at that time as well

## 2021-01-20 DIAGNOSIS — S82892D Other fracture of left lower leg, subsequent encounter for closed fracture with routine healing: Secondary | ICD-10-CM | POA: Diagnosis not present

## 2021-01-20 DIAGNOSIS — E7800 Pure hypercholesterolemia, unspecified: Secondary | ICD-10-CM | POA: Diagnosis not present

## 2021-01-20 DIAGNOSIS — N400 Enlarged prostate without lower urinary tract symptoms: Secondary | ICD-10-CM | POA: Diagnosis not present

## 2021-01-20 DIAGNOSIS — E1122 Type 2 diabetes mellitus with diabetic chronic kidney disease: Secondary | ICD-10-CM | POA: Diagnosis not present

## 2021-01-20 DIAGNOSIS — L03116 Cellulitis of left lower limb: Secondary | ICD-10-CM | POA: Diagnosis not present

## 2021-01-20 DIAGNOSIS — K219 Gastro-esophageal reflux disease without esophagitis: Secondary | ICD-10-CM | POA: Diagnosis not present

## 2021-01-20 DIAGNOSIS — I2510 Atherosclerotic heart disease of native coronary artery without angina pectoris: Secondary | ICD-10-CM | POA: Diagnosis not present

## 2021-01-20 DIAGNOSIS — N189 Chronic kidney disease, unspecified: Secondary | ICD-10-CM | POA: Diagnosis not present

## 2021-01-20 DIAGNOSIS — M1990 Unspecified osteoarthritis, unspecified site: Secondary | ICD-10-CM | POA: Diagnosis not present

## 2021-01-22 DIAGNOSIS — M1990 Unspecified osteoarthritis, unspecified site: Secondary | ICD-10-CM | POA: Diagnosis not present

## 2021-01-22 DIAGNOSIS — K219 Gastro-esophageal reflux disease without esophagitis: Secondary | ICD-10-CM | POA: Diagnosis not present

## 2021-01-22 DIAGNOSIS — L03116 Cellulitis of left lower limb: Secondary | ICD-10-CM | POA: Diagnosis not present

## 2021-01-22 DIAGNOSIS — E7800 Pure hypercholesterolemia, unspecified: Secondary | ICD-10-CM | POA: Diagnosis not present

## 2021-01-22 DIAGNOSIS — E1122 Type 2 diabetes mellitus with diabetic chronic kidney disease: Secondary | ICD-10-CM | POA: Diagnosis not present

## 2021-01-22 DIAGNOSIS — N189 Chronic kidney disease, unspecified: Secondary | ICD-10-CM | POA: Diagnosis not present

## 2021-01-22 DIAGNOSIS — S82892D Other fracture of left lower leg, subsequent encounter for closed fracture with routine healing: Secondary | ICD-10-CM | POA: Diagnosis not present

## 2021-01-22 DIAGNOSIS — N400 Enlarged prostate without lower urinary tract symptoms: Secondary | ICD-10-CM | POA: Diagnosis not present

## 2021-01-22 DIAGNOSIS — I2510 Atherosclerotic heart disease of native coronary artery without angina pectoris: Secondary | ICD-10-CM | POA: Diagnosis not present

## 2021-01-24 ENCOUNTER — Ambulatory Visit (INDEPENDENT_AMBULATORY_CARE_PROVIDER_SITE_OTHER): Payer: Medicare PPO | Admitting: Orthopedic Surgery

## 2021-01-24 ENCOUNTER — Other Ambulatory Visit: Payer: Self-pay

## 2021-01-24 ENCOUNTER — Encounter: Payer: Self-pay | Admitting: Orthopedic Surgery

## 2021-01-24 DIAGNOSIS — S82892D Other fracture of left lower leg, subsequent encounter for closed fracture with routine healing: Secondary | ICD-10-CM

## 2021-01-24 DIAGNOSIS — M65072 Abscess of tendon sheath, left ankle and foot: Secondary | ICD-10-CM | POA: Diagnosis not present

## 2021-01-24 NOTE — Progress Notes (Signed)
Chief Complaint  Patient presents with   Post-op Follow-up    ORIF 09/13/20    Encounter Diagnoses  Name Primary?   Closed fracture of left ankle with routine healing, subsequent encounter s/o ORIF 09/13/20 Yes   Abscess of tendon sheath of left ankle     Follow-up visit  Status post open reduction internal fixation left ankle September 13, 2020  Developed postop osteomyelitis was treated with vancomycin for 6 weeks  His sed rate was 60 and his CRP was 7 on November 10  Labs pending  Patient complains of dysuria  Followed by medicine  Photo was taken of his left ankle he looks very good his wound is closed his range of motion is okay there are no signs of infection at this time  Plan is to order physical therapy for him to transition from walker to cane and do some gait training for steps  Will keep his ASO brace on  The nurse can remove the PICC line  Follow-up in 3 weeks

## 2021-01-29 ENCOUNTER — Telehealth: Payer: Self-pay | Admitting: Orthopedic Surgery

## 2021-01-29 DIAGNOSIS — K219 Gastro-esophageal reflux disease without esophagitis: Secondary | ICD-10-CM | POA: Diagnosis not present

## 2021-01-29 DIAGNOSIS — N189 Chronic kidney disease, unspecified: Secondary | ICD-10-CM | POA: Diagnosis not present

## 2021-01-29 DIAGNOSIS — E1122 Type 2 diabetes mellitus with diabetic chronic kidney disease: Secondary | ICD-10-CM | POA: Diagnosis not present

## 2021-01-29 DIAGNOSIS — I2510 Atherosclerotic heart disease of native coronary artery without angina pectoris: Secondary | ICD-10-CM | POA: Diagnosis not present

## 2021-01-29 DIAGNOSIS — N400 Enlarged prostate without lower urinary tract symptoms: Secondary | ICD-10-CM | POA: Diagnosis not present

## 2021-01-29 DIAGNOSIS — L03116 Cellulitis of left lower limb: Secondary | ICD-10-CM | POA: Diagnosis not present

## 2021-01-29 DIAGNOSIS — S82892D Other fracture of left lower leg, subsequent encounter for closed fracture with routine healing: Secondary | ICD-10-CM | POA: Diagnosis not present

## 2021-01-29 DIAGNOSIS — E7800 Pure hypercholesterolemia, unspecified: Secondary | ICD-10-CM | POA: Diagnosis not present

## 2021-01-29 DIAGNOSIS — M1990 Unspecified osteoarthritis, unspecified site: Secondary | ICD-10-CM | POA: Diagnosis not present

## 2021-01-29 NOTE — Telephone Encounter (Signed)
Left message for Jeff to call back.

## 2021-01-29 NOTE — Telephone Encounter (Signed)
Gave verbal order for pull of PICC line

## 2021-01-29 NOTE — Telephone Encounter (Signed)
Question per call from nurse, Trey Paula, with [facility] ph#[phone] regarding picc line removal

## 2021-01-29 NOTE — Telephone Encounter (Signed)
Trey Paula with [facility] called back, please call him at [phone].

## 2021-01-31 DIAGNOSIS — K219 Gastro-esophageal reflux disease without esophagitis: Secondary | ICD-10-CM | POA: Diagnosis not present

## 2021-01-31 DIAGNOSIS — M1990 Unspecified osteoarthritis, unspecified site: Secondary | ICD-10-CM | POA: Diagnosis not present

## 2021-01-31 DIAGNOSIS — N189 Chronic kidney disease, unspecified: Secondary | ICD-10-CM | POA: Diagnosis not present

## 2021-01-31 DIAGNOSIS — S82892D Other fracture of left lower leg, subsequent encounter for closed fracture with routine healing: Secondary | ICD-10-CM | POA: Diagnosis not present

## 2021-01-31 DIAGNOSIS — E7800 Pure hypercholesterolemia, unspecified: Secondary | ICD-10-CM | POA: Diagnosis not present

## 2021-01-31 DIAGNOSIS — E1122 Type 2 diabetes mellitus with diabetic chronic kidney disease: Secondary | ICD-10-CM | POA: Diagnosis not present

## 2021-01-31 DIAGNOSIS — L03116 Cellulitis of left lower limb: Secondary | ICD-10-CM | POA: Diagnosis not present

## 2021-01-31 DIAGNOSIS — N400 Enlarged prostate without lower urinary tract symptoms: Secondary | ICD-10-CM | POA: Diagnosis not present

## 2021-01-31 DIAGNOSIS — I2510 Atherosclerotic heart disease of native coronary artery without angina pectoris: Secondary | ICD-10-CM | POA: Diagnosis not present

## 2021-02-05 DIAGNOSIS — M1990 Unspecified osteoarthritis, unspecified site: Secondary | ICD-10-CM | POA: Diagnosis not present

## 2021-02-05 DIAGNOSIS — E1122 Type 2 diabetes mellitus with diabetic chronic kidney disease: Secondary | ICD-10-CM | POA: Diagnosis not present

## 2021-02-05 DIAGNOSIS — E7800 Pure hypercholesterolemia, unspecified: Secondary | ICD-10-CM | POA: Diagnosis not present

## 2021-02-05 DIAGNOSIS — N189 Chronic kidney disease, unspecified: Secondary | ICD-10-CM | POA: Diagnosis not present

## 2021-02-05 DIAGNOSIS — K219 Gastro-esophageal reflux disease without esophagitis: Secondary | ICD-10-CM | POA: Diagnosis not present

## 2021-02-05 DIAGNOSIS — S82892D Other fracture of left lower leg, subsequent encounter for closed fracture with routine healing: Secondary | ICD-10-CM | POA: Diagnosis not present

## 2021-02-05 DIAGNOSIS — I2510 Atherosclerotic heart disease of native coronary artery without angina pectoris: Secondary | ICD-10-CM | POA: Diagnosis not present

## 2021-02-05 DIAGNOSIS — L03116 Cellulitis of left lower limb: Secondary | ICD-10-CM | POA: Diagnosis not present

## 2021-02-05 DIAGNOSIS — N400 Enlarged prostate without lower urinary tract symptoms: Secondary | ICD-10-CM | POA: Diagnosis not present

## 2021-02-06 DIAGNOSIS — N400 Enlarged prostate without lower urinary tract symptoms: Secondary | ICD-10-CM | POA: Diagnosis not present

## 2021-02-06 DIAGNOSIS — R350 Frequency of micturition: Secondary | ICD-10-CM | POA: Diagnosis not present

## 2021-02-06 DIAGNOSIS — N390 Urinary tract infection, site not specified: Secondary | ICD-10-CM | POA: Diagnosis not present

## 2021-02-13 DIAGNOSIS — S82892D Other fracture of left lower leg, subsequent encounter for closed fracture with routine healing: Secondary | ICD-10-CM | POA: Diagnosis not present

## 2021-02-13 DIAGNOSIS — E1122 Type 2 diabetes mellitus with diabetic chronic kidney disease: Secondary | ICD-10-CM | POA: Diagnosis not present

## 2021-02-13 DIAGNOSIS — N189 Chronic kidney disease, unspecified: Secondary | ICD-10-CM | POA: Diagnosis not present

## 2021-02-13 DIAGNOSIS — I2510 Atherosclerotic heart disease of native coronary artery without angina pectoris: Secondary | ICD-10-CM | POA: Diagnosis not present

## 2021-02-13 DIAGNOSIS — E7800 Pure hypercholesterolemia, unspecified: Secondary | ICD-10-CM | POA: Diagnosis not present

## 2021-02-13 DIAGNOSIS — K219 Gastro-esophageal reflux disease without esophagitis: Secondary | ICD-10-CM | POA: Diagnosis not present

## 2021-02-13 DIAGNOSIS — N400 Enlarged prostate without lower urinary tract symptoms: Secondary | ICD-10-CM | POA: Diagnosis not present

## 2021-02-13 DIAGNOSIS — L03116 Cellulitis of left lower limb: Secondary | ICD-10-CM | POA: Diagnosis not present

## 2021-02-13 DIAGNOSIS — M1990 Unspecified osteoarthritis, unspecified site: Secondary | ICD-10-CM | POA: Diagnosis not present

## 2021-02-14 ENCOUNTER — Ambulatory Visit (INDEPENDENT_AMBULATORY_CARE_PROVIDER_SITE_OTHER): Payer: Medicare PPO | Admitting: Orthopedic Surgery

## 2021-02-14 ENCOUNTER — Other Ambulatory Visit: Payer: Self-pay

## 2021-02-14 DIAGNOSIS — M869 Osteomyelitis, unspecified: Secondary | ICD-10-CM | POA: Diagnosis not present

## 2021-02-14 DIAGNOSIS — L03116 Cellulitis of left lower limb: Secondary | ICD-10-CM | POA: Diagnosis not present

## 2021-02-14 DIAGNOSIS — S82892D Other fracture of left lower leg, subsequent encounter for closed fracture with routine healing: Secondary | ICD-10-CM

## 2021-02-14 NOTE — Progress Notes (Signed)
Chief Complaint  Patient presents with   Cellulitis    DOS 09/13/20 S/p ORIF LEFT ankle, C/B cellulitis left ankle with osteomyelitis status post 6 weeks of vancomycin   Encounter Diagnoses  Name Primary?   Closed fracture of left ankle with routine healing, subsequent encounter s/o ORIF 09/13/20    Osteomyelitis of left ankle, unspecified type (HCC)    Cellulitis of left ankle Yes

## 2021-02-14 NOTE — Progress Notes (Signed)
Chief Complaint  Patient presents with   Cellulitis    DOS 09/13/20 S/p ORIF LEFT ankle, C/B cellulitis left ankle with osteomyelitis status post 6 weeks of vancomycin   87 year old male continues to improve after his postop complication of osteomyelitis after ORIF of his left ankle  He is starting to use his gaiter he started to do more walking  His ankle feels stable he looks fine other than some swelling which seems to be dependent edema  I do not see any signs of infection  He will come back to see me in 2 months just for routine checkup but otherwise he can remove his ASO brace

## 2021-02-15 DIAGNOSIS — I2510 Atherosclerotic heart disease of native coronary artery without angina pectoris: Secondary | ICD-10-CM | POA: Diagnosis not present

## 2021-02-15 DIAGNOSIS — N400 Enlarged prostate without lower urinary tract symptoms: Secondary | ICD-10-CM | POA: Diagnosis not present

## 2021-02-15 DIAGNOSIS — M1990 Unspecified osteoarthritis, unspecified site: Secondary | ICD-10-CM | POA: Diagnosis not present

## 2021-02-15 DIAGNOSIS — S82892D Other fracture of left lower leg, subsequent encounter for closed fracture with routine healing: Secondary | ICD-10-CM | POA: Diagnosis not present

## 2021-02-15 DIAGNOSIS — E1122 Type 2 diabetes mellitus with diabetic chronic kidney disease: Secondary | ICD-10-CM | POA: Diagnosis not present

## 2021-02-15 DIAGNOSIS — N189 Chronic kidney disease, unspecified: Secondary | ICD-10-CM | POA: Diagnosis not present

## 2021-02-15 DIAGNOSIS — L03116 Cellulitis of left lower limb: Secondary | ICD-10-CM | POA: Diagnosis not present

## 2021-02-15 DIAGNOSIS — K219 Gastro-esophageal reflux disease without esophagitis: Secondary | ICD-10-CM | POA: Diagnosis not present

## 2021-02-15 DIAGNOSIS — E7800 Pure hypercholesterolemia, unspecified: Secondary | ICD-10-CM | POA: Diagnosis not present

## 2021-04-02 ENCOUNTER — Telehealth: Payer: Self-pay

## 2021-04-02 DIAGNOSIS — S82892D Other fracture of left lower leg, subsequent encounter for closed fracture with routine healing: Secondary | ICD-10-CM

## 2021-04-02 NOTE — Telephone Encounter (Signed)
Message left on voicemail stating that patient has lost his cane and hasn't been able to find it. He would like to get a prescription to get another one.

## 2021-04-02 NOTE — Telephone Encounter (Signed)
Called daughter order at front desk

## 2021-04-12 ENCOUNTER — Ambulatory Visit (INDEPENDENT_AMBULATORY_CARE_PROVIDER_SITE_OTHER): Payer: Medicare PPO | Admitting: Orthopedic Surgery

## 2021-04-12 ENCOUNTER — Encounter: Payer: Self-pay | Admitting: Orthopedic Surgery

## 2021-04-12 ENCOUNTER — Other Ambulatory Visit: Payer: Self-pay

## 2021-04-12 DIAGNOSIS — S82892D Other fracture of left lower leg, subsequent encounter for closed fracture with routine healing: Secondary | ICD-10-CM

## 2021-04-12 NOTE — Progress Notes (Signed)
Chief Complaint  Patient presents with   Post-op Follow-up    Left ankle improving ORIF 09/13/20    Angel Norman had ORIF of his left ankle on July 13 to 20, 2022 developed postop osteomyelitis was treated with IV vancomycin for 6 weeks and wound care  He is doing well and is back to all his normal activities  His only complaint of that he occasionally has some burning in his feet is actually bilateral is just left worse than right  No recommendations at this time patient is discharge from care

## 2021-04-16 ENCOUNTER — Ambulatory Visit: Payer: Medicare PPO | Admitting: Orthopedic Surgery

## 2021-04-19 ENCOUNTER — Ambulatory Visit: Payer: Medicare PPO

## 2021-04-19 ENCOUNTER — Encounter: Payer: Self-pay | Admitting: Orthopedic Surgery

## 2021-04-19 ENCOUNTER — Other Ambulatory Visit: Payer: Self-pay

## 2021-04-19 ENCOUNTER — Ambulatory Visit: Payer: Medicare PPO | Admitting: Orthopedic Surgery

## 2021-04-19 DIAGNOSIS — L03116 Cellulitis of left lower limb: Secondary | ICD-10-CM | POA: Diagnosis not present

## 2021-04-19 DIAGNOSIS — S82892D Other fracture of left lower leg, subsequent encounter for closed fracture with routine healing: Secondary | ICD-10-CM | POA: Diagnosis not present

## 2021-04-19 MED ORDER — DOXYCYCLINE HYCLATE 100 MG PO TABS: 100.0000 mg | ORAL_TABLET | Freq: Two times a day (BID) | ORAL | 1 refills | Status: DC

## 2021-04-19 NOTE — Patient Instructions (Signed)
Dr. Aline Brochure will call as soon as he gets the results of the MRI and the lab studies and we will make further arrangements at that time

## 2021-04-19 NOTE — Progress Notes (Signed)
Chief Complaint  Patient presents with   Ankle Pain    S/p ORIF left ankle 09/13/20 now increased pain trouble WB     88 year old male had ORIF of his ankle September 13, 2020  He developed postop osteomyelitis was treated with IV vancomycin for [redacted] weeks along with local wound care  We released him on April 12, 2021  He comes in today with increased pain swelling and difficulty weightbearing   Clinical exam  Left ankle lateral incision looks good medial incision looks good he has swelling posterior to the medial malleolus Achilles tendon intact.  The tender areas include the posterior tibial region including the shaft and posterior tibial tendon he has a flatfoot he has some pain with range of motion of the ankle which appears to be in the same areas the ankle joint itself seems normal without swelling   Left ankle X-ray shows ORIF medial lateral fixation seems to be intact chronic changes of the posterior tibia from the osteomyelitis also noted  He will need further work-up to include CBC Sed rate C-reactive protein Be met basic metabolic panel for contrast MRI with contrast and without contrast  Start doxycycline  Brace protected weightbearing  Meds ordered this encounter  Medications   doxycycline (VIBRA-TABS) 100 MG tablet    Sig: Take 1 tablet (100 mg total) by mouth 2 (two) times daily.    Dispense:  28 tablet    Refill:  1    Past Medical History:  Diagnosis Date   Arthritis    GERD (gastroesophageal reflux disease)    Hyperlipidemia    Rib fractures 12/06/2014   Past Surgical History:  Procedure Laterality Date   ESOPHAGOGASTRODUODENOSCOPY (EGD) WITH PROPOFOL N/A 05/08/2018   Procedure: ESOPHAGOGASTRODUODENOSCOPY (EGD) WITH PROPOFOL;  Surgeon: Rogene Houston, MD;  Location: [facility];  Service: Endoscopy;  Laterality: N/A;   goiter removal     ORIF ANKLE FRACTURE Left 09/13/2020   Procedure: OPEN REDUCTION INTERNAL FIXATION (ORIF) LEFT ANKLE FRACTURE;   Surgeon: Carole Civil, MD;  Location: [facility];  Service: Orthopedics;  Laterality: Left;   TOTAL KNEE ARTHROPLASTY Left 08/18/2012   Dr Sharol Given   TOTAL KNEE ARTHROPLASTY Left 08/19/2012   Procedure: TOTAL KNEE ARTHROPLASTY;  Surgeon: Newt Minion, MD;  Location: [facility];  Service: Orthopedics;  Laterality: Left;  Left Total Knee Arthroplasty   Social History   Tobacco Use   Smoking status: Never   Smokeless tobacco: Never  Vaping Use   Vaping Use: Never used  Substance Use Topics   Alcohol use: No   Drug use: No    No Known Allergies

## 2021-04-20 LAB — CBC WITH DIFFERENTIAL/PLATELET
Absolute Monocytes: 540 cells/uL (ref 200–950)
Basophils Absolute: 42 cells/uL (ref 0–200)
Basophils Relative: 0.7 %
Eosinophils Absolute: 180 cells/uL (ref 15–500)
Eosinophils Relative: 3 %
HCT: 39.4 % (ref 38.5–50.0)
Hemoglobin: 12.2 g/dL — ABNORMAL LOW (ref 13.2–17.1)
Lymphs Abs: 978 cells/uL (ref 850–3900)
MCH: 25.8 pg — ABNORMAL LOW (ref 27.0–33.0)
MCHC: 31 g/dL — ABNORMAL LOW (ref 32.0–36.0)
MCV: 83.5 fL (ref 80.0–100.0)
MPV: 12.2 fL (ref 7.5–12.5)
Monocytes Relative: 9 %
Neutro Abs: 4260 cells/uL (ref 1500–7800)
Neutrophils Relative %: 71 %
Platelets: 235 10*3/uL (ref 140–400)
RBC: 4.72 10*6/uL (ref 4.20–5.80)
RDW: 16.5 % — ABNORMAL HIGH (ref 11.0–15.0)
Total Lymphocyte: 16.3 %
WBC: 6 10*3/uL (ref 3.8–10.8)

## 2021-04-20 LAB — COMPREHENSIVE METABOLIC PANEL
AG Ratio: 1.2 (calc) (ref 1.0–2.5)
ALT: 12 U/L (ref 9–46)
AST: 12 U/L (ref 10–35)
Albumin: 4 g/dL (ref 3.6–5.1)
Alkaline phosphatase (APISO): 65 U/L (ref 35–144)
BUN: 15 mg/dL (ref 7–25)
CO2: 25 mmol/L (ref 20–32)
Calcium: 9.3 mg/dL (ref 8.6–10.3)
Chloride: 104 mmol/L (ref 98–110)
Creat: 0.94 mg/dL (ref 0.70–1.22)
Globulin: 3.4 g/dL (calc) (ref 1.9–3.7)
Glucose, Bld: 86 mg/dL (ref 65–99)
Potassium: 4.5 mmol/L (ref 3.5–5.3)
Sodium: 139 mmol/L (ref 135–146)
Total Bilirubin: 0.7 mg/dL (ref 0.2–1.2)
Total Protein: 7.4 g/dL (ref 6.1–8.1)

## 2021-04-20 LAB — SEDIMENTATION RATE: Sed Rate: 38 mm/h — ABNORMAL HIGH (ref 0–20)

## 2021-04-20 LAB — C-REACTIVE PROTEIN: CRP: 28.8 mg/L — ABNORMAL HIGH (ref <8.0)

## 2021-04-21 ENCOUNTER — Inpatient Hospital Stay (HOSPITAL_COMMUNITY)
Admission: EM | Admit: 2021-04-21 | Discharge: 2021-04-26 | DRG: 502 | Disposition: A | Discharge status: Home Health | Payer: Medicare PPO | Attending: Internal Medicine | Admitting: Internal Medicine

## 2021-04-21 ENCOUNTER — Other Ambulatory Visit: Payer: Self-pay

## 2021-04-21 ENCOUNTER — Encounter (HOSPITAL_COMMUNITY): Payer: Self-pay | Admitting: *Deleted

## 2021-04-21 DIAGNOSIS — S86112A Strain of other muscle(s) and tendon(s) of posterior muscle group at lower leg level, left leg, initial encounter: Secondary | ICD-10-CM | POA: Diagnosis not present

## 2021-04-21 DIAGNOSIS — M659 Synovitis and tenosynovitis, unspecified: Secondary | ICD-10-CM | POA: Diagnosis present

## 2021-04-21 DIAGNOSIS — L03116 Cellulitis of left lower limb: Secondary | ICD-10-CM

## 2021-04-21 DIAGNOSIS — J449 Chronic obstructive pulmonary disease, unspecified: Secondary | ICD-10-CM | POA: Diagnosis not present

## 2021-04-21 DIAGNOSIS — E1122 Type 2 diabetes mellitus with diabetic chronic kidney disease: Secondary | ICD-10-CM | POA: Diagnosis not present

## 2021-04-21 DIAGNOSIS — Z20822 Contact with and (suspected) exposure to covid-19: Secondary | ICD-10-CM | POA: Diagnosis not present

## 2021-04-21 DIAGNOSIS — K219 Gastro-esophageal reflux disease without esophagitis: Secondary | ICD-10-CM | POA: Diagnosis not present

## 2021-04-21 DIAGNOSIS — Z6837 Body mass index (BMI) 37.0-37.9, adult: Secondary | ICD-10-CM

## 2021-04-21 DIAGNOSIS — H9190 Unspecified hearing loss, unspecified ear: Secondary | ICD-10-CM

## 2021-04-21 DIAGNOSIS — E669 Obesity, unspecified: Secondary | ICD-10-CM | POA: Diagnosis not present

## 2021-04-21 DIAGNOSIS — D630 Anemia in neoplastic disease: Secondary | ICD-10-CM | POA: Diagnosis not present

## 2021-04-21 DIAGNOSIS — M25472 Effusion, left ankle: Secondary | ICD-10-CM

## 2021-04-21 DIAGNOSIS — E1140 Type 2 diabetes mellitus with diabetic neuropathy, unspecified: Secondary | ICD-10-CM | POA: Diagnosis not present

## 2021-04-21 DIAGNOSIS — M79605 Pain in left leg: Secondary | ICD-10-CM | POA: Diagnosis not present

## 2021-04-21 DIAGNOSIS — M86179 Other acute osteomyelitis, unspecified ankle and foot: Secondary | ICD-10-CM | POA: Diagnosis present

## 2021-04-21 DIAGNOSIS — M65872 Other synovitis and tenosynovitis, left ankle and foot: Secondary | ICD-10-CM | POA: Diagnosis present

## 2021-04-21 DIAGNOSIS — M66872 Spontaneous rupture of other tendons, left ankle and foot: Secondary | ICD-10-CM | POA: Diagnosis not present

## 2021-04-21 DIAGNOSIS — E119 Type 2 diabetes mellitus without complications: Secondary | ICD-10-CM | POA: Diagnosis not present

## 2021-04-21 DIAGNOSIS — E1169 Type 2 diabetes mellitus with other specified complication: Secondary | ICD-10-CM | POA: Diagnosis present

## 2021-04-21 DIAGNOSIS — Z7984 Long term (current) use of oral hypoglycemic drugs: Secondary | ICD-10-CM | POA: Diagnosis not present

## 2021-04-21 DIAGNOSIS — Z79899 Other long term (current) drug therapy: Secondary | ICD-10-CM

## 2021-04-21 DIAGNOSIS — M1990 Unspecified osteoarthritis, unspecified site: Secondary | ICD-10-CM | POA: Diagnosis present

## 2021-04-21 DIAGNOSIS — D638 Anemia in other chronic diseases classified elsewhere: Secondary | ICD-10-CM | POA: Diagnosis not present

## 2021-04-21 DIAGNOSIS — S86119A Strain of other muscle(s) and tendon(s) of posterior muscle group at lower leg level, unspecified leg, initial encounter: Secondary | ICD-10-CM | POA: Diagnosis present

## 2021-04-21 DIAGNOSIS — N183 Chronic kidney disease, stage 3 unspecified: Secondary | ICD-10-CM | POA: Diagnosis not present

## 2021-04-21 DIAGNOSIS — S82892D Other fracture of left lower leg, subsequent encounter for closed fracture with routine healing: Secondary | ICD-10-CM

## 2021-04-21 DIAGNOSIS — S86112D Strain of other muscle(s) and tendon(s) of posterior muscle group at lower leg level, left leg, subsequent encounter: Secondary | ICD-10-CM | POA: Diagnosis not present

## 2021-04-21 DIAGNOSIS — E785 Hyperlipidemia, unspecified: Secondary | ICD-10-CM | POA: Diagnosis present

## 2021-04-21 DIAGNOSIS — N1831 Chronic kidney disease, stage 3a: Secondary | ICD-10-CM | POA: Diagnosis not present

## 2021-04-21 DIAGNOSIS — R600 Localized edema: Secondary | ICD-10-CM | POA: Diagnosis not present

## 2021-04-21 DIAGNOSIS — M65969 Unspecified synovitis and tenosynovitis, unspecified lower leg: Secondary | ICD-10-CM | POA: Diagnosis present

## 2021-04-21 DIAGNOSIS — E782 Mixed hyperlipidemia: Secondary | ICD-10-CM | POA: Diagnosis present

## 2021-04-21 LAB — CBC WITH DIFFERENTIAL/PLATELET
Abs Immature Granulocytes: 0.01 10*3/uL (ref 0.00–0.07)
Basophils Absolute: 0 10*3/uL (ref 0.0–0.1)
Basophils Relative: 1 %
Eosinophils Absolute: 0.1 10*3/uL (ref 0.0–0.5)
Eosinophils Relative: 2 %
HCT: 41.3 % (ref 39.0–52.0)
Hemoglobin: 12 g/dL — ABNORMAL LOW (ref 13.0–17.0)
Immature Granulocytes: 0 %
Lymphocytes Relative: 11 %
Lymphs Abs: 0.7 10*3/uL (ref 0.7–4.0)
MCH: 25.3 pg — ABNORMAL LOW (ref 26.0–34.0)
MCHC: 29.1 g/dL — ABNORMAL LOW (ref 30.0–36.0)
MCV: 87.1 fL (ref 80.0–100.0)
Monocytes Absolute: 0.5 10*3/uL (ref 0.1–1.0)
Monocytes Relative: 9 %
Neutro Abs: 4.8 10*3/uL (ref 1.7–7.7)
Neutrophils Relative %: 77 %
Platelets: 204 10*3/uL (ref 150–400)
RBC: 4.74 MIL/uL (ref 4.22–5.81)
RDW: 18.7 % — ABNORMAL HIGH (ref 11.5–15.5)
WBC: 6.2 10*3/uL (ref 4.0–10.5)
nRBC: 0 % (ref 0.0–0.2)

## 2021-04-21 LAB — C-REACTIVE PROTEIN: CRP: 6.9 mg/dL — ABNORMAL HIGH (ref <1.0)

## 2021-04-21 LAB — GLUCOSE, CAPILLARY: Glucose-Capillary: 129 mg/dL — ABNORMAL HIGH (ref 70–99)

## 2021-04-21 LAB — RESP PANEL BY RT-PCR (FLU A&B, COVID) ARPGX2
Influenza A by PCR: NEGATIVE
Influenza B by PCR: NEGATIVE
SARS Coronavirus 2 by RT PCR: NEGATIVE

## 2021-04-21 LAB — BASIC METABOLIC PANEL
Anion gap: 7 (ref 5–15)
BUN: 18 mg/dL (ref 8–23)
CO2: 26 mmol/L (ref 22–32)
Calcium: 8.6 mg/dL — ABNORMAL LOW (ref 8.9–10.3)
Chloride: 104 mmol/L (ref 98–111)
Creatinine, Ser: 1.14 mg/dL (ref 0.61–1.24)
GFR, Estimated: >60 mL/min (ref >60)
Glucose, Bld: 109 mg/dL — ABNORMAL HIGH (ref 70–99)
Potassium: 4.3 mmol/L (ref 3.5–5.1)
Sodium: 137 mmol/L (ref 135–145)

## 2021-04-21 LAB — SEDIMENTATION RATE: Sed Rate: 42 mm/hr — ABNORMAL HIGH (ref 0–16)

## 2021-04-21 MED ORDER — TAMSULOSIN HCL 0.4 MG PO CAPS
0.4000 mg | ORAL_CAPSULE | Freq: Every day | ORAL | Status: DC
  Administered 2021-04-22 – 2021-04-26 (×4): 0.4 mg via ORAL
  Filled 2021-04-21 (×5): qty 1

## 2021-04-21 MED ORDER — HEPARIN SODIUM (PORCINE) 5000 UNIT/ML IJ SOLN
5000.0000 [IU] | Freq: Three times a day (TID) | INTRAMUSCULAR | Status: DC
  Administered 2021-04-21 – 2021-04-23 (×6): 5000 [IU] via SUBCUTANEOUS
  Filled 2021-04-21 (×6): qty 1

## 2021-04-21 MED ORDER — INSULIN ASPART 100 UNIT/ML IJ SOLN: 0.0000 [IU] | Freq: Every day | INTRAMUSCULAR | Status: DC

## 2021-04-21 MED ORDER — ONDANSETRON HCL 4 MG/2ML IJ SOLN: 4.0000 mg | Freq: Four times a day (QID) | INTRAMUSCULAR | Status: DC | PRN

## 2021-04-21 MED ORDER — INSULIN ASPART 100 UNIT/ML IJ SOLN
3.0000 [IU] | Freq: Three times a day (TID) | INTRAMUSCULAR | Status: DC
  Administered 2021-04-22 – 2021-04-23 (×4): 3 [IU] via SUBCUTANEOUS

## 2021-04-21 MED ORDER — PRAVASTATIN SODIUM 40 MG PO TABS
80.0000 mg | ORAL_TABLET | Freq: Every day | ORAL | Status: DC
  Administered 2021-04-21 – 2021-04-23 (×3): 80 mg via ORAL
  Filled 2021-04-21 (×3): qty 2

## 2021-04-21 MED ORDER — ONDANSETRON HCL 4 MG PO TABS
4.0000 mg | ORAL_TABLET | Freq: Four times a day (QID) | ORAL | Status: DC | PRN
  Administered 2021-04-23: 4 mg via ORAL
  Filled 2021-04-21: qty 1

## 2021-04-21 MED ORDER — SODIUM CHLORIDE 0.9% FLUSH
3.0000 mL | Freq: Two times a day (BID) | INTRAVENOUS | Status: DC
  Administered 2021-04-21 – 2021-04-23 (×4): 3 mL via INTRAVENOUS

## 2021-04-21 MED ORDER — SODIUM CHLORIDE 0.9 % IV SOLN: 250.0000 mL | INTRAVENOUS | Status: DC | PRN

## 2021-04-21 MED ORDER — TRAZODONE HCL 50 MG PO TABS
50.0000 mg | ORAL_TABLET | Freq: Every evening | ORAL | Status: DC | PRN
  Administered 2021-04-21 – 2021-04-25 (×2): 50 mg via ORAL
  Filled 2021-04-21 (×2): qty 1

## 2021-04-21 MED ORDER — BISACODYL 10 MG RE SUPP: 10.0000 mg | Freq: Every day | RECTAL | Status: DC | PRN

## 2021-04-21 MED ORDER — TRAMADOL HCL 50 MG PO TABS
50.0000 mg | ORAL_TABLET | Freq: Four times a day (QID) | ORAL | Status: DC | PRN
  Administered 2021-04-24 – 2021-04-25 (×2): 50 mg via ORAL
  Filled 2021-04-21 (×2): qty 1

## 2021-04-21 MED ORDER — SODIUM CHLORIDE 0.9% FLUSH
3.0000 mL | Freq: Two times a day (BID) | INTRAVENOUS | Status: DC
  Administered 2021-04-21 – 2021-04-23 (×4): 3 mL via INTRAVENOUS

## 2021-04-21 MED ORDER — INSULIN ASPART 100 UNIT/ML IJ SOLN
0.0000 [IU] | Freq: Three times a day (TID) | INTRAMUSCULAR | Status: DC
  Administered 2021-04-22 – 2021-04-24 (×3): 1 [IU] via SUBCUTANEOUS

## 2021-04-21 MED ORDER — SODIUM CHLORIDE 0.9% FLUSH: 3.0000 mL | INTRAVENOUS | Status: DC | PRN

## 2021-04-21 MED ORDER — ACETAMINOPHEN 650 MG RE SUPP: 650.0000 mg | Freq: Four times a day (QID) | RECTAL | Status: DC | PRN

## 2021-04-21 MED ORDER — ACETAMINOPHEN 325 MG PO TABS: 650.0000 mg | ORAL_TABLET | Freq: Four times a day (QID) | ORAL | Status: DC | PRN

## 2021-04-21 MED ORDER — VANCOMYCIN HCL 2000 MG/400ML IV SOLN
2000.0000 mg | Freq: Once | INTRAVENOUS | Status: AC
  Administered 2021-04-21: 2000 mg via INTRAVENOUS
  Filled 2021-04-21: qty 400

## 2021-04-21 MED ORDER — VANCOMYCIN HCL 1250 MG/250ML IV SOLN
1250.0000 mg | Freq: Two times a day (BID) | INTRAVENOUS | Status: DC
  Administered 2021-04-22: 05:00:00 2000 mg via INTRAVENOUS
  Administered 2021-04-22 – 2021-04-26 (×8): 1250 mg via INTRAVENOUS
  Filled 2021-04-21 (×9): qty 250

## 2021-04-21 MED ORDER — POLYETHYLENE GLYCOL 3350 17 G PO PACK: 17.0000 g | PACK | Freq: Every day | ORAL | Status: DC | PRN

## 2021-04-21 NOTE — ED Triage Notes (Signed)
Pt with left ankle surgery recently,  with swelling and redness to ankle and pt was started on oral antibiotic recently.  Family member states ankle looks worse and concerned for possible DVT. Pt states he is able to walk on it but c/o burning to ankle.

## 2021-04-21 NOTE — ED Provider Notes (Signed)
[facility] Provider Note   CSN: [id number] Arrival date & time: 04/21/21  1515     History  Chief Complaint  Patient presents with   Ankle Pain    [name] is a 88 y.o. male.  HPI  Patient with medical history including GERD, hyperlipidemia, ORIF left ankle fracture osteomyelitis presents with chief complaint of left-sided ankle pain.  Patient states that his pain started on Thursday, describes as a burning-like sensation on the bottom of his foot, as well as a throbbing-like pain which is worsened with movement improved with rest, he denies any paresthesias or weakness in that foot, denies any recent trauma to the area, he notes that he had some slight swelling around the area for last couple days and it has gotten much worse, denies any skin changes, no drainage or discharge.  He states he went to his orthopedic doctor who showed him for an MRI.  Supposed get this on Thursday of next week.  Reviewed patient's chart had ORIF left ankle fracture in July 2022 subsequently developed osteomyelitis was started on vancomycin for 6 weeks, and was released from care from Dr. Aline Brochure on April 12, 2021.  He presented back on Thursday 02/16 for complaints labwork and imaging were obtained shows no acute osteomyelitis no leukocytosis but does show elevated sed rate as her C- reactant.  Home Medications Prior to Admission medications   Medication Sig Start Date End Date Taking? Authorizing Provider  acetaminophen (TYLENOL) 325 MG tablet Take 2 tablets (650 mg total) by mouth every 6 (six) hours. 09/15/20   Johnson, Clanford L, MD  doxycycline (VIBRA-TABS) 100 MG tablet Take 1 tablet (100 mg total) by mouth 2 (two) times daily. 04/19/21   Carole Civil, MD  JARDIANCE 25 MG TABS tablet Take 25 mg by mouth every morning. 07/11/20   [provider]  metFORMIN (GLUCOPHAGE) 1000 MG tablet Take 1,000 mg by mouth 2 (two) times daily. 04/23/20   [provider]  OVER THE COUNTER MEDICATION Take 1 tablet by mouth daily. Cera-Vite    [provider]  pravastatin (PRAVACHOL) 80 MG tablet Take 80 mg by mouth daily.    [provider]  tamsulosin (FLOMAX) 0.4 MG CAPS capsule Take 0.4 mg by mouth in the morning and at bedtime.    [provider]  traMADol (ULTRAM) 50 MG tablet Take 1 tablet (50 mg total) by mouth every 6 (six) hours as needed. 11/08/20   Carole Civil, MD      Allergies    Patient has no known allergies.    Review of Systems   Review of Systems  Constitutional:  Negative for chills and fever.  Respiratory:  Negative for shortness of breath.   Cardiovascular:  Negative for chest pain.  Gastrointestinal:  Negative for abdominal pain.  Musculoskeletal:        Left ankle pain.  Neurological:  Negative for headaches.   Physical Exam Updated Vital Signs BP 105/83 (BP Location: Left Arm)    Pulse 78    Temp (!) 97.5 F (36.4 C) (Oral)    Resp 16    Ht 6\' 3"  (1.905 m)    Wt 136.1 kg    SpO2 98%    BMI 37.50 kg/m  Physical Exam Vitals and nursing note reviewed.  Constitutional:      General: He is not in acute distress.    Appearance: Normal appearance. He is not ill-appearing or diaphoretic.  HENT:  Head: Normocephalic and atraumatic.     Nose: No congestion or rhinorrhea.  Eyes:     General: No scleral icterus.       Right eye: No discharge.        Left eye: No discharge.     Conjunctiva/sclera: Conjunctivae normal.  Cardiovascular:     Rate and Rhythm: Normal rate and regular rhythm.  Pulmonary:     Effort: Pulmonary effort is normal.     Breath sounds: Normal breath sounds.  Musculoskeletal:     Cervical back: Neck supple.     Right lower leg: No edema.     Left lower leg: No edema.     Comments: Left leg was visualized he has noted joint effusion around the ankle, he has surgical scar noted in the medial malleolus, no overlying skin changes, no erythema, no drainage or discharge  noted.  Area was slightly warm to the touch no fluctuance or induration, good dorsal pedal pulses neurovascular fully intact.  Full range of motion of his ankle and toes, pain located mainly around the medial malleolus.  No palpable cords noted in patient's calf, no calf tenderness, negative Thompson sign.  Skin:    General: Skin is warm and dry.     Coloration: Skin is not jaundiced or pale.  Neurological:     Mental Status: He is alert and oriented to person, place, and time.  Psychiatric:        Mood and Affect: Mood normal.    ED Results / Procedures / Treatments   Labs (all labs ordered are listed, but only abnormal results are displayed) Labs Reviewed  BASIC METABOLIC PANEL - Abnormal; Notable for the following components:      Result Value   Glucose, Bld 109 (*)    Calcium 8.6 (*)    All other components within normal limits  CBC WITH DIFFERENTIAL/PLATELET - Abnormal; Notable for the following components:   Hemoglobin 12.0 (*)    MCH 25.3 (*)    MCHC 29.1 (*)    RDW 18.7 (*)    All other components within normal limits  RESP PANEL BY RT-PCR (FLU A&B, COVID) ARPGX2  SEDIMENTATION RATE  C-REACTIVE PROTEIN    EKG None  Radiology No results found.  Procedures Procedures    Medications Ordered in ED Medications  vancomycin (VANCOREADY) IVPB 2000 mg/400 mL (has no administration in time range)  vancomycin (VANCOREADY) IVPB 1250 mg/250 mL (has no administration in time range)    ED Course/ Medical Decision Making/ A&P                           Medical Decision Making Amount and/or Complexity of Data Reviewed Labs: ordered. Radiology: ordered.   This patient presents to the ED for concern of left ankle pain, this involves an extensive number of treatment options, and is a complaint that carries with it a high risk of complications and morbidity.  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  The pertinent results include: CBC shows normocytic anemia at baseline   Imaging Studies ordered:  I ordered  imaging studies including MRI left ankle without contrast I independently visualized and interpreted imaging which showed pending at this time I agree with the radiologist interpretation    Reevaluation:  Presents with left ankle swelling, with obvious joint effusion, due to his hardware and history of osteomyelitis we will consult with orthopedic surgery for further recommendations.  Updated patient recommendation from orthopedic surgery and they are agreement this plan will consult hospital team for admission.  Consultation-spoke with Dr. Aline Brochure of orthopedics recommends hospital admissions, obtain MRI of ankle, started on vancomycin he will evaluate the patient.  Spoke with Dr. Joesph Fillers who has admitted the patient.   Rule out Low suspicion for systemic infection patient does not meet sepsis or SIRS criteria.  Low suspicion for fracture or dislocation there is been no recent trauma to the area, had imaging performed 2 days ago which was negative for these findings.  I have low suspicion for DVT he has no calf tenderness, no palpable cords, pain is located around medial malleolus and is more consistent with possible osteomyelitis versus joint effusion.  Low suspicion for PE denies pleuritic chest pain, shortness of breath, vital signs are reassuring.    Dispostion and problem list  After consideration of the diagnostic results and the patients response to treatment, I feel that the patent would benefit from  admission.  Left ankle effusion-unclear etiology but concerning for early osteomyelitis, will need IV antibiotics, MRI and further evaluation by orthopedics.            Final Clinical Impression(s) / ED Diagnoses Final diagnoses:  Left ankle effusion    Rx / DC Orders ED Discharge Orders     None         Marcello Fennel, PA-C 04/21/21 1754    Daleen Bo, MD 04/22/21 [phone]

## 2021-04-21 NOTE — ED Provider Notes (Signed)
°  Face-to-face evaluation   History: He presents for evaluation of worsening pain and swelling left ankle.  Onset about a week and a half ago.  He saw his orthopedist on 04/12/2021, then again on 04/19/2021.  On second visit he was started on doxycycline after imaging, labs and x-rays, apparently for treatment of possible infection.  Physical exam: Left ankle tender and swollen consistent with joint effusion.  Diffuse tenderness of the left ankle.  No significant erythema or warmth of the ankle.  No notable pain, swelling or deformity of the left lower leg.  Neurovascular intact distally in the toes of the left foot.  MDM: Evaluation for  Chief Complaint  Patient presents with   Ankle Pain     Pain swelling left ankle with prior history of ORIF, and subsequent postoperative osteomyelitis treated with vancomycin.  He is presenting now with signs and symptoms of left ankle joint infection.  It appears he needs to have an ankle joint tap, to obtain fluid and check for infection.  Medical screening examination/treatment/procedure(s) were conducted as a shared visit with non-physician practitioner(s) and myself.  I personally evaluated the patient during the encounter    Mancel Bale, MD 04/22/21 [phone]

## 2021-04-21 NOTE — H&P (Signed)
Patient Demographics:    Angel Norman, is a 87 y.o. male  MRN: [id number]   DOB - June 25, 1932  Admit Date - 04/21/2021  Outpatient Primary MD for the patient is Mayra Neer, MD   Assessment & Plan:   Assessment and Plan:   1) acute on chronic osteomyelitis of the left ankle--- had ORIF of his ankle September 13, 2020 -Postop developed osteomyelitis was treated for 6 to 8 weeks with IV antibiotics at that time -Now with increased swelling, pain warmth and tenderness over the left ankle -Orthopedic surgeon Dr. Aline Brochure recommends IV vancomycin and MRI ESR 42, up from 38 on 04/19/2021 -CRP was 28.8 on 04/19/2021, repeat CRP pending -WBC 6.2 --Left ankle x-rays from 04/19/2021 noted-- intact hardware status post ORIF left ankle,  Bone changes from prior osteomyelitis -MRI of the left ankle with and without contrast to be done on Monday, 04/23/2021  2)DM2--no recent A1c, hold metformin and Jardiance Use Novolog/Humalog Sliding scale insulin with Accu-Cheks/Fingersticks as ordered   3) chronic anemia--- Hgb stable at 12.0, no bleeding concerns at this time monitor closely  4)HLD--continue pravastatin  Disposition/Need for in-Hospital Stay- patient unable to be discharged at this time due to acute on chronic left ankle osteomyelitis requiring IV antibiotics and possible operative intervention pending MRI findings  Status is: Inpatient  Remains inpatient appropriate because:   Dispo: The patient is from: Home              Anticipated d/c is to: Home              Anticipated d/c date is: 3 days              Patient currently is not medically stable to d/c. Barriers: Not Clinically Stable-    With History of - Reviewed by me  Past Medical History:  Diagnosis Date   Arthritis    GERD (gastroesophageal reflux  disease)    Hyperlipidemia    Rib fractures 12/06/2014      Past Surgical History:  Procedure Laterality Date   ESOPHAGOGASTRODUODENOSCOPY (EGD) WITH PROPOFOL N/A 05/08/2018   Procedure: ESOPHAGOGASTRODUODENOSCOPY (EGD) WITH PROPOFOL;  Surgeon: Rogene Houston, MD;  Location: [facility];  Service: Endoscopy;  Laterality: N/A;   goiter removal     ORIF ANKLE FRACTURE Left 09/13/2020   Procedure: OPEN REDUCTION INTERNAL FIXATION (ORIF) LEFT ANKLE FRACTURE;  Surgeon: Carole Civil, MD;  Location: [facility];  Service: Orthopedics;  Laterality: Left;   TOTAL KNEE ARTHROPLASTY Left 08/18/2012   Dr Sharol Given   TOTAL KNEE ARTHROPLASTY Left 08/19/2012   Procedure: TOTAL KNEE ARTHROPLASTY;  Surgeon: Newt Minion, MD;  Location: [place];  Service: Orthopedics;  Laterality: Left;  Left Total Knee Arthroplasty      Chief Complaint  Patient presents with   Ankle Pain      HPI:    Angel Norman  is a 88 y.o. male with past medical history relevant for  obesity, GERD, chronic anemia, HLD, who previously underwent  ORIF of his ankle September 13, 2020 -Postop developed osteomyelitis was treated for 6 to 8 weeks with IV antibiotics at that time - Additional history obtained from patient's daughter at bedside -No fever  Or chills   No Nausea, Vomiting or Diarrhea -- Complains of increasing discomfort/pain especially with weightbearing over the left ankle area, concerns of back left ankle area paresthesia as well- -No recent traumas or falls -No chest pains no palpitations no dizziness no shortness of breath -No leg pain otherwise except for left ankle pain diarrhea and no pleuritic type symptoms - ESR 42, up from 38 on 04/19/2021 -CRP was 28.8 on 04/19/2021, repeat CRP pending -WBC 6.2 Creatinine is 1.1 -Left ankle x-rays from 04/19/2021 noted-- intact hardware status post ORIF left ankle,  Bone changes from prior osteomyelitis -MRI of the left ankle with and without contrast to be done on Monday,  04/23/2021 - EDP discussed case with patient's orthopedic surgeon Dr. Aline Brochure who recommends admission to the hospital for IV vancomycin with possible operative intervention pending MRI findings from 04/23/2021     Review of systems:    In addition to the HPI above,   A full Review of  Systems was done, all other systems reviewed are negative except as noted above in HPI , .    Social History:  Reviewed by me    Social History   Tobacco Use   Smoking status: Never   Smokeless tobacco: Never  Substance Use Topics   Alcohol use: No       Family History :  Reviewed by me    Family History  Family history unknown: Yes     Home Medications:   Prior to Admission medications   Medication Sig Start Date End Date Taking? Authorizing Provider  acetaminophen (TYLENOL) 325 MG tablet Take 2 tablets (650 mg total) by mouth every 6 (six) hours. 09/15/20   Johnson, Clanford L, MD  doxycycline (VIBRA-TABS) 100 MG tablet Take 1 tablet (100 mg total) by mouth 2 (two) times daily. 04/19/21   Carole Civil, MD  JARDIANCE 25 MG TABS tablet Take 25 mg by mouth every morning. 07/11/20   [provider]  metFORMIN (GLUCOPHAGE) 1000 MG tablet Take 1,000 mg by mouth 2 (two) times daily. 04/23/20   [provider]  OVER THE COUNTER MEDICATION Take 1 tablet by mouth daily. Cera-Vite    [provider]  pravastatin (PRAVACHOL) 80 MG tablet Take 80 mg by mouth daily.    [provider]  tamsulosin (FLOMAX) 0.4 MG CAPS capsule Take 0.4 mg by mouth in the morning and at bedtime.    [provider]  traMADol (ULTRAM) 50 MG tablet Take 1 tablet (50 mg total) by mouth every 6 (six) hours as needed. 11/08/20   Carole Civil, MD     Allergies:    No Known Allergies   Physical Exam:   Vitals  Blood pressure 126/69, pulse 76, temperature 98.2 F (36.8 C), temperature source Oral, resp. rate 20, height $RemoveBe'6\' 3"'HwdTrbDpj$  (1.905 m), weight 136.1 kg, SpO2 98  %.  Physical Examination: General appearance - alert,  and in no distress  Mental status - alert, oriented to person, place, and time,  Eyes - sclera anicteric Ears- HOH Neck - supple, no JVD elevation , Chest - clear  to auscultation bilaterally, symmetrical air movement,  Heart - S1 and S2 normal, regular  Abdomen - soft, nontender, nondistended, no masses  or organomegaly Neurological - screening mental status exam normal, neck supple without rigidity, cranial nerves II through XII intact, DTR's normal and symmetric Extremities -  , intact peripheral pulses  Skin - warm, dry MSK-left ankle with swelling, warmth, tenderness, please see photos in epic below -  Media Information Document Information  Photos  Lt fibula side ankle swelling   04/21/2021 17:55  Attached To:  Hospital Encounter on 04/21/21   Source Information  Roxan Hockey, MD   [facility]       Media Information Document Information  Photos  Lt medial ankle swelling   04/21/2021 17:54  Attached To:  Hospital Encounter on 04/21/21   Source Information  Roxan Hockey, MD   [facility]      Data Review:    CBC Recent Labs  Lab 04/19/21 1111 04/21/21 1653  WBC 6.0 6.2  HGB 12.2* 12.0*  HCT 39.4 41.3  PLT 235 204  MCV 83.5 87.1  MCH 25.8* 25.3*  MCHC 31.0* 29.1*  RDW 16.5* 18.7*  LYMPHSABS 978 0.7  MONOABS  --  0.5  EOSABS 180 0.1  BASOSABS 42 0.0   Chemistries  Recent Labs  Lab 04/19/21 1111 04/21/21 1653  NA 139 137  K 4.5 4.3  CL 104 104  CO2 25 26  GLUCOSE 86 109*  BUN 15 18  CREATININE 0.94 1.14  CALCIUM 9.3 8.6*  AST 12  --   ALT 12  --   BILITOT 0.7  --    ------------------------------------------------------------------------------------------------------------------ estimated creatinine clearance is 69.1 mL/min (by C-G formula based on SCr of 1.14  mg/dL). ------------------------------------------------------------------------------------------------------------------ No results for input(s): TSH, T4TOTAL, T3FREE, THYROIDAB in the last 72 hours.  Invalid input(s): FREET3   Coagulation profile No results for input(s): INR, PROTIME in the last 168 hours. ------------------------------------------------------------------------------------------------------------------- No results for input(s): DDIMER in the last 72 hours. -------------------------------------------------------------------------------------------------------------------  Cardiac Enzymes No results for input(s): CKMB, TROPONINI, MYOGLOBIN in the last 168 hours.  Invalid input(s): CK -------------------------------------------------------------------------------------------- Urinalysis    Component Value Date/Time   COLORURINE YELLOW 11/28/2020 1752   APPEARANCEUR CLEAR 11/28/2020 1752   LABSPEC 1.028 11/28/2020 1752   PHURINE 5.0 11/28/2020 1752   GLUCOSEU >=500 (A) 11/28/2020 1752   HGBUR NEGATIVE 11/28/2020 1752   BILIRUBINUR NEGATIVE 11/28/2020 1752   KETONESUR 5 (A) 11/28/2020 1752   PROTEINUR NEGATIVE 11/28/2020 1752   NITRITE NEGATIVE 11/28/2020 1752   LEUKOCYTESUR NEGATIVE 11/28/2020 1752     Imaging Results:    No results found.  Radiological Exams on Admission: No results found.  DVT Prophylaxis -SCD/Heparin AM Labs Ordered, also please review Full Orders  Family Communication: Admission, patients condition and plan of care including tests being ordered have been discussed with the patient and daughter who indicate understanding and agree with the plan   Code Status - Full Code  Likely DC to home  Condition   stable  Roxan Hockey M.D on 04/21/2021 at 7:21 PM Go to [email or web address] -  for contact info  [facility] - Office  [phone]

## 2021-04-21 NOTE — Progress Notes (Signed)
Pharmacy Antibiotic Note  Angel Norman is a 88 y.o. male admitted on 04/21/2021 with  osteomyelitis (suspected) .  Pharmacy has been consulted for Vancomycin dosing.  Plan: Vancomycin 1250 mg IV Q 12 hrs. Goal AUC 400-550. Expected AUC: 504.8 SCr used: 1.14   Height: 6\' 3"  (190.5 cm) Weight: 136.1 kg (300 lb) IBW/kg (Calculated) : 84.5  Temp (24hrs), Avg:97.5 F (36.4 C), Min:97.5 F (36.4 C), Max:97.5 F (36.4 C)  Recent Labs  Lab 04/19/21 1111 04/21/21 1653  WBC 6.0 6.2  CREATININE 0.94 1.14    Estimated Creatinine Clearance: 69.1 mL/min (by C-G formula based on SCr of 1.14 mg/dL).    No Known Allergies  Antimicrobials this admission: Vanco 2/18 >>     Dose adjustments this admission:   Microbiology results:  BCx: pending  UCx:    Sputum:    MRSA PCR:   Thank you for allowing pharmacy to be a part of this patients care.  Hart Robinsons A 04/21/2021 5:35 PM

## 2021-04-22 ENCOUNTER — Inpatient Hospital Stay: Payer: Self-pay

## 2021-04-22 DIAGNOSIS — M86179 Other acute osteomyelitis, unspecified ankle and foot: Secondary | ICD-10-CM | POA: Diagnosis not present

## 2021-04-22 DIAGNOSIS — D638 Anemia in other chronic diseases classified elsewhere: Secondary | ICD-10-CM | POA: Diagnosis not present

## 2021-04-22 DIAGNOSIS — N1831 Chronic kidney disease, stage 3a: Secondary | ICD-10-CM | POA: Diagnosis not present

## 2021-04-22 DIAGNOSIS — E1169 Type 2 diabetes mellitus with other specified complication: Secondary | ICD-10-CM

## 2021-04-22 DIAGNOSIS — K219 Gastro-esophageal reflux disease without esophagitis: Secondary | ICD-10-CM | POA: Diagnosis not present

## 2021-04-22 DIAGNOSIS — H9190 Unspecified hearing loss, unspecified ear: Secondary | ICD-10-CM

## 2021-04-22 LAB — CBC
HCT: 37.2 % — ABNORMAL LOW (ref 39.0–52.0)
Hemoglobin: 11.2 g/dL — ABNORMAL LOW (ref 13.0–17.0)
MCH: 26.3 pg (ref 26.0–34.0)
MCHC: 30.1 g/dL (ref 30.0–36.0)
MCV: 87.3 fL (ref 80.0–100.0)
Platelets: 222 10*3/uL (ref 150–400)
RBC: 4.26 MIL/uL (ref 4.22–5.81)
RDW: 18.7 % — ABNORMAL HIGH (ref 11.5–15.5)
WBC: 5.2 10*3/uL (ref 4.0–10.5)
nRBC: 0 % (ref 0.0–0.2)

## 2021-04-22 LAB — GLUCOSE, CAPILLARY
Glucose-Capillary: 159 mg/dL — ABNORMAL HIGH (ref 70–99)
Glucose-Capillary: 118 mg/dL — ABNORMAL HIGH (ref 70–99)
Glucose-Capillary: 156 mg/dL — ABNORMAL HIGH (ref 70–99)
Glucose-Capillary: 121 mg/dL — ABNORMAL HIGH (ref 70–99)

## 2021-04-22 LAB — BASIC METABOLIC PANEL
Anion gap: 7 (ref 5–15)
BUN: 20 mg/dL (ref 8–23)
CO2: 22 mmol/L (ref 22–32)
Calcium: 8.2 mg/dL — ABNORMAL LOW (ref 8.9–10.3)
Chloride: 105 mmol/L (ref 98–111)
Creatinine, Ser: 0.95 mg/dL (ref 0.61–1.24)
GFR, Estimated: >60 mL/min (ref >60)
Glucose, Bld: 109 mg/dL — ABNORMAL HIGH (ref 70–99)
Potassium: 3.8 mmol/L (ref 3.5–5.1)
Sodium: 134 mmol/L — ABNORMAL LOW (ref 135–145)

## 2021-04-22 LAB — HEMOGLOBIN A1C
Hgb A1c MFr Bld: 5.7 % — ABNORMAL HIGH (ref 4.8–5.6)
Mean Plasma Glucose: 116.89 mg/dL

## 2021-04-22 MED ORDER — PANTOPRAZOLE SODIUM 40 MG PO TBEC
40.0000 mg | DELAYED_RELEASE_TABLET | Freq: Every day | ORAL | Status: DC
  Administered 2021-04-22 – 2021-04-23 (×2): 40 mg via ORAL
  Filled 2021-04-22 (×2): qty 1

## 2021-04-22 NOTE — Assessment & Plan Note (Addendum)
Hg stable at 11.

## 2021-04-22 NOTE — Assessment & Plan Note (Signed)
Protonix daily for GI protection

## 2021-04-22 NOTE — Assessment & Plan Note (Signed)
Acetaminophen ordered for pain and inflammation

## 2021-04-22 NOTE — Assessment & Plan Note (Addendum)
Ruled out ?

## 2021-04-22 NOTE — Assessment & Plan Note (Addendum)
Resume home metformin and Jardiance ? ?

## 2021-04-22 NOTE — Assessment & Plan Note (Addendum)
Have patient repeat back what he has heard to be sure he understood the information.   Speak to patient with loud voice.  May need to remove mask so he can follow/read lips.

## 2021-04-22 NOTE — Consult Note (Addendum)
Chief Complaint  Patient presents with   Left ankle pain and burning   Consult has been requested by Dr. Irwin Brakeman  Reason for consultation left ankle pain and swelling  The patient is 88 years old he does have type 2 diabetes, he had open reduction internal fixation of his left ankle September 13, 2020 postoperatively he developed osteomyelitis was treated with IV vancomycin for [redacted] weeks along with local wound care  He was released on April 12, 2021 and at that time he was complaining of burning pain along the medial ankle but had no redness erythema had mild swelling which was chronic this was thought to be secondary to tibial nerve irritation from his pes planus and previous surgery  However he presented back to the office on February 16 complaining of increased pain and swelling  He was sent for CBC, sed rate, C-reactive protein and MRI, and started on doxycycline 100 mg twice daily to cover for staph  His C-reactive protein and sed rate came back elevated White count was normal MRI was scheduled for Thursday  He presented back to the ER on 04/21/2021 complaining of increased pain and swelling since office visit on the 16th  He was admitted for IV antibiotics and further work-up  He complains of pain swelling and again burning sensation medial side of his ankle  Review of systems he is afebrile he has difficulty weightbearing on the left ankle since the swelling and burning have increased.  He was placed in a cam walker on February 16 but that did not help unload the ankle allowing him to walk.  Review of Systems  Constitutional:  Negative for diaphoresis, malaise/fatigue and weight loss.  HENT:  Negative for ear discharge, ear pain and sinus pain.   Eyes:  Negative for blurred vision.  Respiratory:  Negative for shortness of breath.   Cardiovascular:  Negative for chest pain.  Gastrointestinal:  Negative for heartburn.  Genitourinary:  Negative for dysuria.   Musculoskeletal:  Negative for falls.  Skin:  Negative for itching and rash.  Neurological:  Positive for tingling and sensory change. Negative for dizziness, focal weakness, seizures, loss of consciousness and weakness.  Endo/Heme/Allergies:  Negative for polydipsia. Bruises/bleeds easily.  Psychiatric/Behavioral:  Negative for depression, hallucinations, memory loss, substance abuse and suicidal ideas. The patient is not nervous/anxious and does not have insomnia.    BP 103/60 (BP Location: Right Arm)    Pulse 79    Temp 98.3 F (36.8 C)    Resp 20    Ht 6\' 3"  (1.905 m)    Wt 136.1 kg    SpO2 94%    BMI 37.50 kg/m   General appearance the patient is noted to have BMI of 37.5 but has no gross deformities grooming hygiene acceptable  Cardiovascular he has distal peripheral edema bilaterally left greater than right normal pulse perfusion color and temperature  Neurologically he has normal sensation in his feet to soft touch pressure and pain  Lymph nodes are negative in the groin area  Psychiatric he is awake alert and oriented x3 mood and affect are normal  Musculoskeletal: The left ankle does not show any erythema (skin) Tenderness: He is tender on the posterior aspect of the ankle on the medial side with swelling and pitting edema Range of motion: He has limited ankle motion a chronic pes planus deformity which is flexible at the heel,He has limited range of motion flexion extension of approximately 5 degrees dorsiflexion 15 plantarflexion this is  chronic after his ankle injury Motor: Muscle tone and motor function is normal Instability: There is no ankle instability  Assessment and plan and medical decision making  Acute on chronic osteomyelitis Type 2 diabetes mellitus   Images the x-ray in the office on which February 16 shows intact ankle fixation stable ankle mortise chronic posterior tibial erosion from previous osteomyelitis  Recommend Started on IV antibiotics  Order  PICC line for 6 weeks of IV antibiotics  Order MRI for Monday  Planned surgery for Tuesday  Plan discharge for Thursday on IV antibiotics culture specific  CBC Latest Ref Rng & Units 04/22/2021 04/21/2021 04/19/2021  WBC 4.0 - 10.5 K/uL 5.2 6.2 6.0  Hemoglobin 13.0 - 17.0 g/dL 11.2(L) 12.0(L) 12.2(L)  Hematocrit 39.0 - 52.0 % 37.2(L) 41.3 39.4  Platelets 150 - 400 K/uL 222 204 235   BMP Latest Ref Rng & Units 04/22/2021 04/21/2021 04/19/2021  Glucose 70 - 99 mg/dL 109(H) 109(H) 86  BUN 8 - 23 mg/dL 20 18 15   Creatinine 0.61 - 1.24 mg/dL 0.95 1.14 0.94  BUN/Creat Ratio 6 - 22 (calc) - - NOT APPLICABLE  Sodium A999333 - 145 mmol/L 134(L) 137 139  Potassium 3.5 - 5.1 mmol/L 3.8 4.3 4.5  Chloride 98 - 111 mmol/L 105 104 104  CO2 22 - 32 mmol/L 22 26 25   Calcium 8.9 - 10.3 mg/dL 8.2(L) 8.6(L) 9.3   C-reactive protein was 28.8 on February 16 and then 6.9 on February 18  Sed rate was 38 on 16 February and 42 on 18 February  He seems to have adequate kidney function BUN 20 creatinine 0.95

## 2021-04-22 NOTE — Assessment & Plan Note (Signed)
His GFR has improved.   Continue monitoring.

## 2021-04-22 NOTE — Hospital Course (Addendum)
87 y.o. male with past medical history relevant for obesity, GERD, chronic anemia, HLD, who previously underwent  ORIF of his ankle September 13, 2020 -Postop developed osteomyelitis was treated for 6 to 8 weeks with IV antibiotics at that time  Additional history obtained from patient's daughter at bedside -No fever  Or chills  No Nausea, Vomiting or Diarrhea -- Complains of increasing discomfort/pain especially with weightbearing over the left ankle area, concerns of back left ankle area paresthesia as well- -No recent traumas or falls -No chest pains no palpitations no dizziness no shortness of breath -No leg pain otherwise except for left ankle pain diarrhea and no pleuritic type symptoms  ESR 42, up from 38 on 04/19/2021 -CRP was 28.8 on 04/19/2021, repeat CRP pending -WBC 6.2 Creatinine is 1.1 -Left ankle x-rays from 04/19/2021 noted-- intact hardware status post ORIF left ankle,  Bone changes from prior osteomyelitis -MRI of the left ankle with and without contrast to be done on Monday, 04/23/2021 - EDP discussed case with patient's orthopedic surgeon Dr. Aline Brochure who recommends admission to the hospital for IV vancomycin with possible operative intervention pending MRI findings from 04/23/2021  04/22/2021:  Pt agreeable to getting MRI with and without contrast on 2/20 and understands we don't have MRI on weekends at this hospital.  Pt also agreeable to PICC line if recommended by ortho.   04/23/2021:  pt had MRI of left ankle done, venous doppler of left leg neg for DVT, continues to complain of pain and discomfort left ankle, not ambulating.   04/24/2021: Pt went to OR with Dr. Aline Brochure: partial rupture posterior tibial tendon s/p repair, tenoynovectomy left ankle, and left ankle joint aspiration.    04/25/2021: Initial culture results negative and patient working with PT.  Awaiting final culture results prior to discharge.  04/26/2021: Culture results have returned negative and PT has  recommended home health physiotherapy.  No other acute events noted throughout the course of the stay.  Seen by orthopedics this a.m. with recommendations to remain on doxycycline 100 mg twice daily for 2 weeks and then follow-up outpatient.

## 2021-04-22 NOTE — Progress Notes (Signed)
PROGRESS NOTE   [facility]  MOQ:[id number] DOB: 1933/01/26 DOA: 04/21/2021 PCP: Mayra Neer, MD   Chief Complaint  Patient presents with   Ankle Pain   Level of care: Telemetry  Brief Admission History:  88 y.o. male with past medical history relevant for obesity, GERD, chronic anemia, HLD, who previously underwent  ORIF of his ankle September 13, 2020 -Postop developed osteomyelitis was treated for 6 to 8 weeks with IV antibiotics at that time - Additional history obtained from patient's daughter at bedside -No fever  Or chills    No Nausea, Vomiting or Diarrhea -- Complains of increasing discomfort/pain especially with weightbearing over the left ankle area, concerns of back left ankle area paresthesia as well- -No recent traumas or falls -No chest pains no palpitations no dizziness no shortness of breath -No leg pain otherwise except for left ankle pain diarrhea and no pleuritic type symptoms - ESR 42, up from 38 on 04/19/2021 -CRP was 28.8 on 04/19/2021, repeat CRP pending -WBC 6.2 Creatinine is 1.1 -Left ankle x-rays from 04/19/2021 noted-- intact hardware status post ORIF left ankle,  Bone changes from prior osteomyelitis -MRI of the left ankle with and without contrast to be done on Monday, 04/23/2021 - EDP discussed case with patient's orthopedic surgeon Dr. Aline Brochure who recommends admission to the [facility] for IV vancomycin with possible operative intervention pending MRI findings from 04/23/2021  04/22/2021:  Pt agreeable to getting MRI with and without contrast on 2/20 and understands we don't have MRI on weekends at this [facility].  Pt also agreeable to PICC line if recommended by ortho.    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 Aline Brochure consult and recommendations Tentative care plan  MRI with and without gadolinium on 2/20 IV vancomycin dosed per pharmacy to begin immediately Arrange for PICC line  placement on 2/22 for 6 more weeks of IV antibiotics To OR with Dr. Aline Brochure on 2/21 to obtain cultures Anticipating DC home on culture specific IV antibiotics 2/23  Mixed hyperlipidemia- (present on admission) Pravastatin 80 mg daily resumed   Type 2 diabetes mellitus with neuropathy- (present on admission) Holding home metformin and Jardiance in [facility] only Follow frequent CBG testing and supplemental SSI coverage.  Further recommendations pending more CBG data.  Carb modified Diet CBG (last 3)  Recent Labs    04/21/21 2124 04/22/21 0734 04/22/21 1103  GLUCAP 129* 159* 118*    Osteoarthritis- (present on admission) Acetaminophen ordered for pain and inflammation   Obesity (BMI 30-39.9)- (present on admission) Mobility is difficult due to left ankle infection, pain, inability to bear weight PT eval for bed exercises  Anemia, chronic disease- (present on admission) Hg stable at 11.   Follow intermittently.   Hearing loss Have patient repeat back what he has heard to be sure he understood the information.   Speak to patient with loud voice.  May need to remove mask so he can follow/read lips.   CKD (chronic kidney disease) stage 3, GFR 30-59 ml/min (HCC)- (present on admission) His GFR has improved.   Continue monitoring.    GERD (gastroesophageal reflux disease)- (present on admission) Protonix daily for GI protection   DVT prophylaxis: SQ heparin Code Status: Full  Family Communication:  Disposition: Status is: Inpatient Remains inpatient appropriate because: IV antibiotics    Consultants:  Orthopedics Dr. Aline Brochure  Procedures:  Tentative OR with Dr Aline Brochure 2/21 Antimicrobials:  Vancomycin IV 2/18>>  Subjective: Pt reports  his left ankle is swollen and burning.  °Objective: °Vitals:  ° 04/21/21 2125 04/22/21 0133 04/22/21 0427 04/22/21 1215  °BP: 132/73 131/75 103/60 118/66  °Pulse: 90 93 79 71  °Resp: 19 20 20 20  °Temp: 98.1 °F (36.7 °C) 98.1 °F  (36.7 °C) 98.3 °F (36.8 °C) 98.1 °F (36.7 °C)  °TempSrc:    Oral  °SpO2: 97% 100% 94% 97%  °Weight:      °Height:      ° ° °Intake/Output Summary (Last 24 hours) at 04/22/2021 1527 °Last data filed at 04/22/2021 1217 °Gross per 24 hour  °Intake 917 ml  °Output 1300 ml  °Net -383 ml  ° °Filed Weights  ° 04/21/21 1524  °Weight: 136.1 kg  ° °Examination: ° °General exam: awake, alert, cooperative, Appears calm and comfortable  °Respiratory system: Clear to auscultation. Respiratory effort normal. °Cardiovascular system: normal S1 & S2 heard. No JVD, murmurs, rubs, gallops or clicks. No pedal edema. °Gastrointestinal system: Abdomen is nondistended, soft and nontender. No organomegaly or masses felt. Normal bowel sounds heard. °Central nervous system: Alert and oriented. No focal neurological deficits. °Extremities: swollen, tender left leg and ankle, mild erythema left ankle, pitting edema left ankle and left lower leg. Symmetric 5 x 5 power. °Skin: No rashes, lesions or ulcers. °Psychiatry: Judgement and insight appear normal. Mood & affect appropriate.  ° °Data Reviewed: I have personally reviewed following labs and imaging studies ° °CBC: °Recent Labs  °Lab 04/19/21 °1111 04/21/21 °1653 04/22/21 °0418  °WBC 6.0 6.2 5.2  °NEUTROABS 4,260 4.8  --   °HGB 12.2* 12.0* 11.2*  °HCT 39.4 41.3 37.2*  °MCV 83.5 87.1 87.3  °PLT 235 204 222  ° ° °Basic Metabolic Panel: °Recent Labs  °Lab 04/19/21 °1111 04/21/21 °1653 04/22/21 °0418  °NA 139 137 134*  °K 4.5 4.3 3.8  °CL 104 104 105  °CO2 25 26 22  °GLUCOSE 86 109* 109*  °BUN 15 18 20  °CREATININE 0.94 1.14 0.95  °CALCIUM 9.3 8.6* 8.2*  ° ° °CBG: °Recent Labs  °Lab 04/21/21 °2124 04/22/21 °0734 04/22/21 °1103  °GLUCAP 129* 159* 118*  ° ° °Recent Results (from the past 240 hour(s))  °Resp Panel by RT-PCR (Flu A&B, Covid) Nasopharyngeal Swab     Status: None  ° Collection Time: 04/21/21  4:40 PM  ° Specimen: Nasopharyngeal Swab; Nasopharyngeal(NP) swabs in vial transport medium   °Result Value Ref Range Status  ° SARS Coronavirus 2 by RT PCR NEGATIVE NEGATIVE Final  °  Comment: (NOTE) °SARS-CoV-2 target nucleic acids are NOT DETECTED. ° °The SARS-CoV-2 RNA is generally detectable in upper respiratory °specimens during the acute phase of infection. The lowest °concentration of SARS-CoV-2 viral copies this assay can detect is °138 copies/mL. A negative result does not preclude SARS-Cov-2 °infection and should not be used as the sole basis for treatment or °other patient management decisions. A negative result may occur with  °improper specimen collection/handling, submission of specimen other °than nasopharyngeal swab, presence of viral mutation(s) within the °areas targeted by this assay, and inadequate number of viral °copies(<138 copies/mL). A negative result must be combined with °clinical observations, patient history, and epidemiological °information. The expected result is Negative. ° °Fact Sheet for Patients:  °https://www.fda.gov/media/152166/download ° °Fact Sheet for Healthcare Providers:  °https://www.fda.gov/media/152162/download ° °This test is no t yet approved or cleared by the [place] FDA and  °has been authorized for detection and/or diagnosis of SARS-CoV-2 by °FDA under an Emergency Use Authorization (EUA). This EUA will   will remain  in effect (meaning this test can be used) for the duration of the COVID-19 declaration under Section 564(b)(1) of the Act, 21 U.S.C.section 360bbb-3(b)(1), unless the authorization is terminated  or revoked sooner.       Influenza A by PCR NEGATIVE NEGATIVE Final   Influenza B by PCR NEGATIVE NEGATIVE Final    Comment: (NOTE) The Xpert Xpress SARS-CoV-2/FLU/RSV plus assay is intended as an aid in the diagnosis of influenza from Nasopharyngeal swab specimens and should not be used as a sole basis for treatment. Nasal washings and aspirates are unacceptable for Xpert Xpress SARS-CoV-2/FLU/RSV testing.  Fact Sheet for  Patients: EntrepreneurPulse.com.au  Fact Sheet for Healthcare Providers: IncredibleEmployment.be  This test is not yet approved or cleared by the [place] FDA and has been authorized for detection and/or diagnosis of SARS-CoV-2 by FDA under an Emergency Use Authorization (EUA). This EUA will remain in effect (meaning this test can be used) for the duration of the COVID-19 declaration under Section 564(b)(1) of the Act, 21 U.S.C. section 360bbb-3(b)(1), unless the authorization is terminated or revoked.  Performed at [facility], [address], [place], [place] [address]      Radiology Studies: [place] EKG SITE RITE  Result Date: 04/22/2021 If Site Rite image not attached, placement could not be confirmed due to current cardiac rhythm.   Scheduled Meds:  heparin  5,000 Units Subcutaneous Q8H   insulin aspart  0-5 Units Subcutaneous QHS   insulin aspart  0-6 Units Subcutaneous TID WC   insulin aspart  3 Units Subcutaneous TID WC   pantoprazole  40 mg Oral QHS   pravastatin  80 mg Oral Daily   sodium chloride flush  3 mL Intravenous Q12H   sodium chloride flush  3 mL Intravenous Q12H   tamsulosin  0.4 mg Oral QPC breakfast   Continuous Infusions:  sodium chloride     vancomycin 2,000 mg (04/22/21 0433)     LOS: 1 day   Time spent: 35 mins  Jimy Gates Wynetta Emery, MD How to contact the [facility] Attending or Consulting provider [place] or covering provider during after hours [place], for this patient?  Check the care team in [facility] and look for a) attending/consulting TR[facility] provider listed and b) the [facility] team listed Log into [email or web address] and use [place]'s universal password to access. If you do not have the password, please contact the [facility] operator. Locate the [facility] provider you are looking for under [facility] and page to a number that you can be directly reached. If you still have difficulty reaching the provider, please page the [facility]  (Director on Call) for the Hospitalists listed on amion for assistance.  04/22/2021, 3:27 PM

## 2021-04-22 NOTE — H&P (View-Only) (Signed)
Chief Complaint  Patient presents with   Left ankle pain and burning   Consult has been requested by Dr. Irwin Brakeman  Reason for consultation left ankle pain and swelling  The patient is 88 years old he does have type 2 diabetes, he had open reduction internal fixation of his left ankle September 13, 2020 postoperatively he developed osteomyelitis was treated with IV vancomycin for [redacted] weeks along with local wound care  He was released on April 12, 2021 and at that time he was complaining of burning pain along the medial ankle but had no redness erythema had mild swelling which was chronic this was thought to be secondary to tibial nerve irritation from his pes planus and previous surgery  However he presented back to the office on February 16 complaining of increased pain and swelling  He was sent for CBC, sed rate, C-reactive protein and MRI, and started on doxycycline 100 mg twice daily to cover for staph  His C-reactive protein and sed rate came back elevated White count was normal MRI was scheduled for Thursday  He presented back to the ER on 04/21/2021 complaining of increased pain and swelling since office visit on the 16th  He was admitted for IV antibiotics and further work-up  He complains of pain swelling and again burning sensation medial side of his ankle  Review of systems he is afebrile he has difficulty weightbearing on the left ankle since the swelling and burning have increased.  He was placed in a cam walker on February 16 but that did not help unload the ankle allowing him to walk.  Review of Systems  Constitutional:  Negative for diaphoresis, malaise/fatigue and weight loss.  HENT:  Negative for ear discharge, ear pain and sinus pain.   Eyes:  Negative for blurred vision.  Respiratory:  Negative for shortness of breath.   Cardiovascular:  Negative for chest pain.  Gastrointestinal:  Negative for heartburn.  Genitourinary:  Negative for dysuria.   Musculoskeletal:  Negative for falls.  Skin:  Negative for itching and rash.  Neurological:  Positive for tingling and sensory change. Negative for dizziness, focal weakness, seizures, loss of consciousness and weakness.  Endo/Heme/Allergies:  Negative for polydipsia. Bruises/bleeds easily.  Psychiatric/Behavioral:  Negative for depression, hallucinations, memory loss, substance abuse and suicidal ideas. The patient is not nervous/anxious and does not have insomnia.    BP 103/60 (BP Location: Right Arm)    Pulse 79    Temp 98.3 F (36.8 C)    Resp 20    Ht 6\' 3"  (1.905 m)    Wt 136.1 kg    SpO2 94%    BMI 37.50 kg/m   General appearance the patient is noted to have BMI of 37.5 but has no gross deformities grooming hygiene acceptable  Cardiovascular he has distal peripheral edema bilaterally left greater than right normal pulse perfusion color and temperature  Neurologically he has normal sensation in his feet to soft touch pressure and pain  Lymph nodes are negative in the groin area  Psychiatric he is awake alert and oriented x3 mood and affect are normal  Musculoskeletal: The left ankle does not show any erythema (skin) Tenderness: He is tender on the posterior aspect of the ankle on the medial side with swelling and pitting edema Range of motion: He has limited ankle motion a chronic pes planus deformity which is flexible at the heel,He has limited range of motion flexion extension of approximately 5 degrees dorsiflexion 15 plantarflexion this is  chronic after his ankle injury Motor: Muscle tone and motor function is normal Instability: There is no ankle instability  Assessment and plan and medical decision making  Acute on chronic osteomyelitis Type 2 diabetes mellitus   Images the x-ray in the office on which February 16 shows intact ankle fixation stable ankle mortise chronic posterior tibial erosion from previous osteomyelitis  Recommend Started on IV antibiotics  Order  PICC line for 6 weeks of IV antibiotics  Order MRI for Monday  Planned surgery for Tuesday  Plan discharge for Thursday on IV antibiotics culture specific  CBC Latest Ref Rng & Units 04/22/2021 04/21/2021 04/19/2021  WBC 4.0 - 10.5 K/uL 5.2 6.2 6.0  Hemoglobin 13.0 - 17.0 g/dL 11.2(L) 12.0(L) 12.2(L)  Hematocrit 39.0 - 52.0 % 37.2(L) 41.3 39.4  Platelets 150 - 400 K/uL 222 204 235   BMP Latest Ref Rng & Units 04/22/2021 04/21/2021 04/19/2021  Glucose 70 - 99 mg/dL 109(H) 109(H) 86  BUN 8 - 23 mg/dL 20 18 15   Creatinine 0.61 - 1.24 mg/dL 0.95 1.14 0.94  BUN/Creat Ratio 6 - 22 (calc) - - NOT APPLICABLE  Sodium A999333 - 145 mmol/L 134(L) 137 139  Potassium 3.5 - 5.1 mmol/L 3.8 4.3 4.5  Chloride 98 - 111 mmol/L 105 104 104  CO2 22 - 32 mmol/L 22 26 25   Calcium 8.9 - 10.3 mg/dL 8.2(L) 8.6(L) 9.3   C-reactive protein was 28.8 on February 16 and then 6.9 on February 18  Sed rate was 38 on 16 February and 42 on 18 February  He seems to have adequate kidney function BUN 20 creatinine 0.95

## 2021-04-22 NOTE — Assessment & Plan Note (Signed)
-   Pravastatin 80 mg daily resumed 

## 2021-04-22 NOTE — Progress Notes (Signed)
Vascular team called to place PICC on patient.

## 2021-04-22 NOTE — Plan of Care (Signed)
  Problem: Clinical Measurements: Goal: Will remain free from infection Outcome: Progressing   Problem: Pain Managment: Goal: General experience of comfort will improve Outcome: Progressing   

## 2021-04-22 NOTE — Assessment & Plan Note (Addendum)
Mobility is difficult due to left ankle pain, weight bearing as tolerated PT to see 04/25/21

## 2021-04-22 NOTE — Progress Notes (Signed)
Spoke with Delorise Jackson, LPN and Dr. Romeo Apple. Plan is for patient to potentially discharge Thursday February 23. Plan to place PICC Wednesday February 22. Will obtain consent today.

## 2021-04-23 ENCOUNTER — Inpatient Hospital Stay (HOSPITAL_COMMUNITY): Payer: Medicare PPO

## 2021-04-23 DIAGNOSIS — M86179 Other acute osteomyelitis, unspecified ankle and foot: Secondary | ICD-10-CM | POA: Diagnosis not present

## 2021-04-23 DIAGNOSIS — N1831 Chronic kidney disease, stage 3a: Secondary | ICD-10-CM | POA: Diagnosis not present

## 2021-04-23 DIAGNOSIS — D638 Anemia in other chronic diseases classified elsewhere: Secondary | ICD-10-CM | POA: Diagnosis not present

## 2021-04-23 DIAGNOSIS — K219 Gastro-esophageal reflux disease without esophagitis: Secondary | ICD-10-CM | POA: Diagnosis not present

## 2021-04-23 LAB — URINALYSIS, ROUTINE W REFLEX MICROSCOPIC
Bilirubin Urine: NEGATIVE
Glucose, UA: >=500 mg/dL — AB
Ketones, ur: NEGATIVE mg/dL
Nitrite: NEGATIVE
Protein, ur: NEGATIVE mg/dL
Specific Gravity, Urine: 1.022 (ref 1.005–1.030)
pH: 5 (ref 5.0–8.0)

## 2021-04-23 LAB — CBC
HCT: 38.3 % — ABNORMAL LOW (ref 39.0–52.0)
Hemoglobin: 11 g/dL — ABNORMAL LOW (ref 13.0–17.0)
MCH: 24.9 pg — ABNORMAL LOW (ref 26.0–34.0)
MCHC: 28.7 g/dL — ABNORMAL LOW (ref 30.0–36.0)
MCV: 86.8 fL (ref 80.0–100.0)
Platelets: 205 10*3/uL (ref 150–400)
RBC: 4.41 MIL/uL (ref 4.22–5.81)
RDW: 18.6 % — ABNORMAL HIGH (ref 11.5–15.5)
WBC: 5 10*3/uL (ref 4.0–10.5)
nRBC: 0 % (ref 0.0–0.2)

## 2021-04-23 LAB — BASIC METABOLIC PANEL
Anion gap: 7 (ref 5–15)
BUN: 18 mg/dL (ref 8–23)
CO2: 24 mmol/L (ref 22–32)
Calcium: 8.3 mg/dL — ABNORMAL LOW (ref 8.9–10.3)
Chloride: 104 mmol/L (ref 98–111)
Creatinine, Ser: 0.93 mg/dL (ref 0.61–1.24)
GFR, Estimated: >60 mL/min (ref >60)
Glucose, Bld: 109 mg/dL — ABNORMAL HIGH (ref 70–99)
Potassium: 3.8 mmol/L (ref 3.5–5.1)
Sodium: 135 mmol/L (ref 135–145)

## 2021-04-23 LAB — GLUCOSE, CAPILLARY
Glucose-Capillary: 108 mg/dL — ABNORMAL HIGH (ref 70–99)
Glucose-Capillary: 99 mg/dL (ref 70–99)
Glucose-Capillary: 103 mg/dL — ABNORMAL HIGH (ref 70–99)
Glucose-Capillary: 119 mg/dL — ABNORMAL HIGH (ref 70–99)

## 2021-04-23 LAB — SURGICAL PCR SCREEN
MRSA, PCR: NEGATIVE
Staphylococcus aureus: NEGATIVE

## 2021-04-23 IMAGING — MR MR ANKLE*L* WO/W CM
5 of 8 series · 26 of 40 positions shown · IV contrast (gadavist)
Comparison: None.

CLINICAL DATA: Aseptic necrosis left ankle

EXAM:
MRI OF THE LEFT ANKLE WITHOUT AND WITH CONTRAST
TECHNIQUE: Multiplanar, multisequence MR imaging of the ankle was performed
before and after the administration of intravenous contrast.
CONTRAST:  10mL GADAVIST GADOBUTROL 1 MMOL/ML IV SOLN

[Series 3: PD fat-sat · axial · left · 3.0mm · 0.43mm/px · z∈[-51,+104]mm · 6 of 40 slices shown]
[im 1/40]
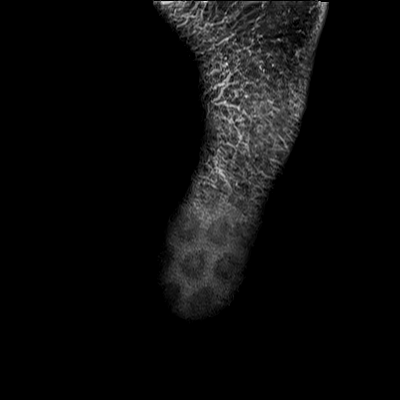
[im 8/40]
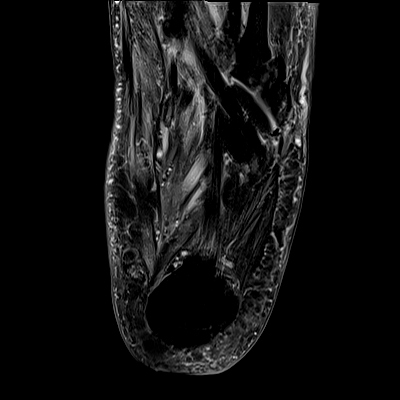
[im 16/40]
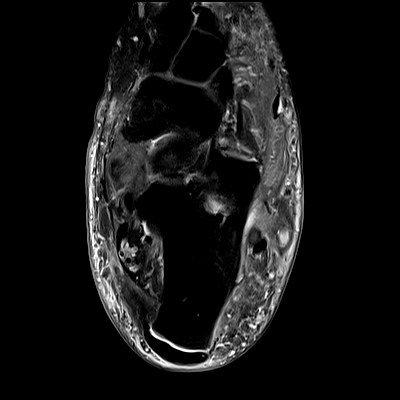
[im 24/40]
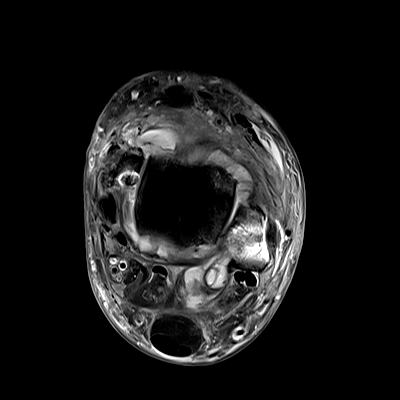
[im 32/40]
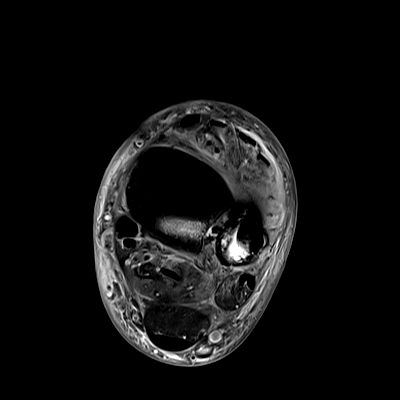
[im 40/40]
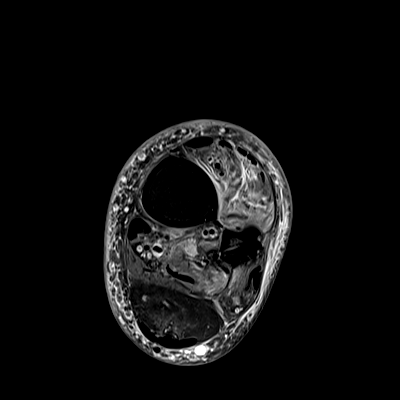

[Series 4: T2 fat-sat · axial · left · 3.0mm · 0.44mm/px · z∈[-51,+104]mm · 6 of 40 slices shown (1 of 2)]
[im 1/40]
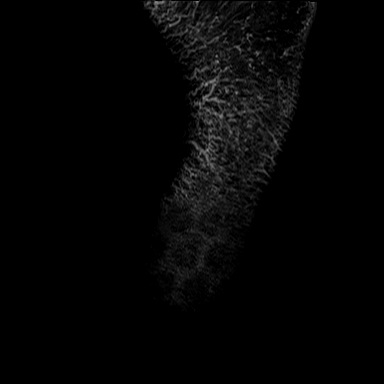
[im 8/40]
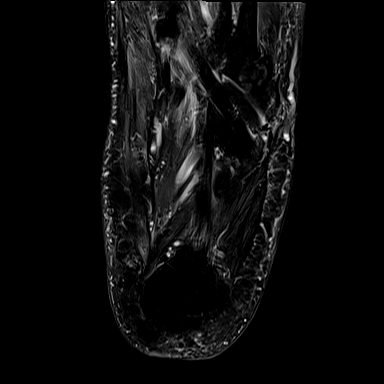
[im 16/40]
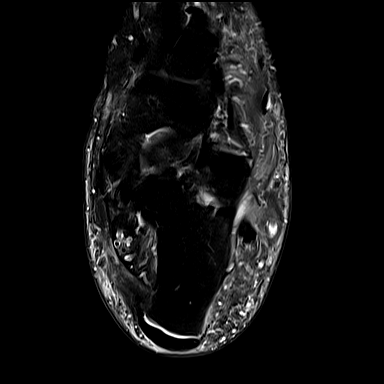
[im 24/40]
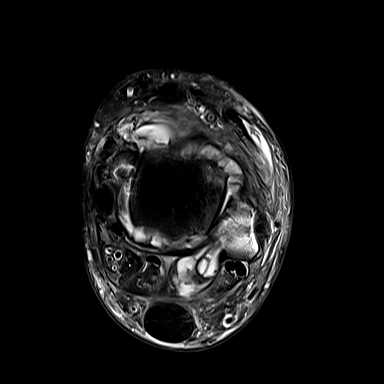
[im 32/40]
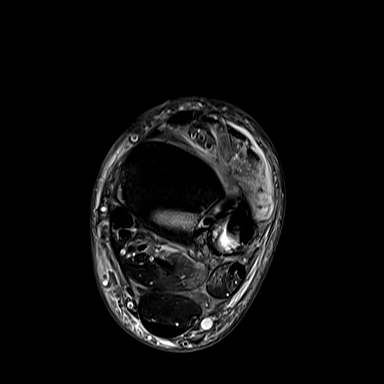
[im 40/40]
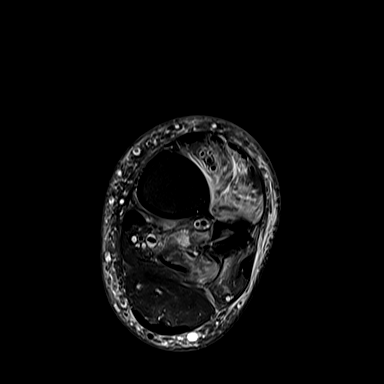

[Series 5: T2 fat-sat · coronal · left · 3.0mm · 0.44mm/px · 5 of 35 slices shown (2 of 2)]
[im 1/35]
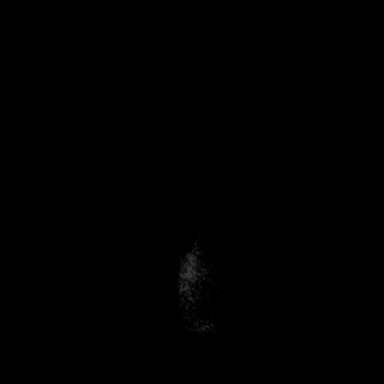
[im 9/35]
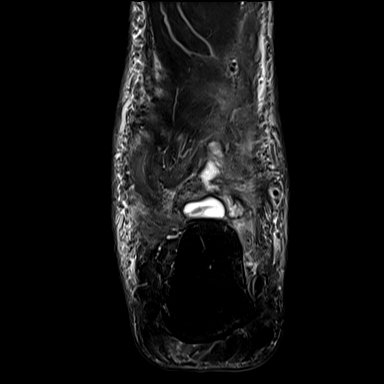
[im 18/35]
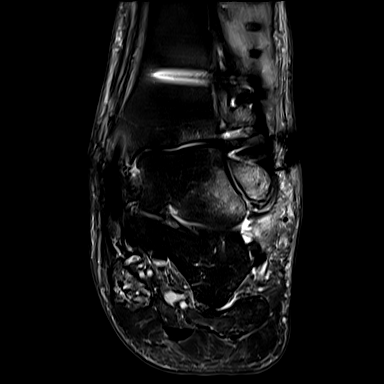
[im 26/35]
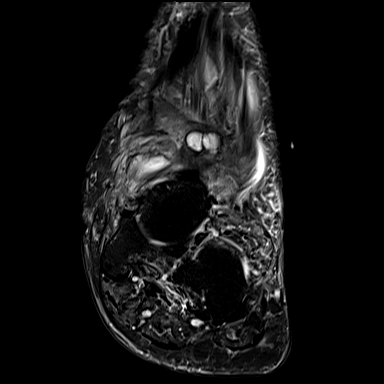
[im 35/35]
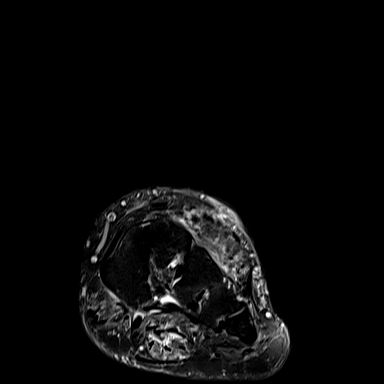

[Series 6: T1 · sagittal · left · 4.0mm · 0.43mm/px · 3 of 21 slices shown]
[im 1/21]
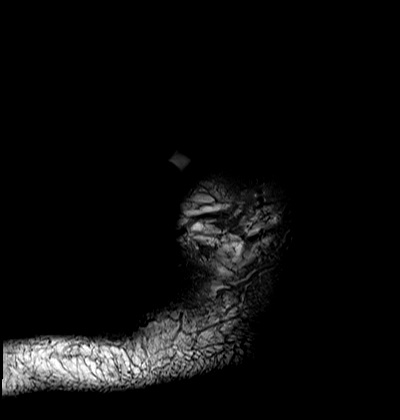
[im 11/21]
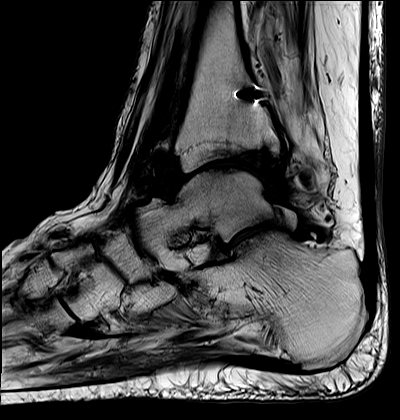
[im 21/21]
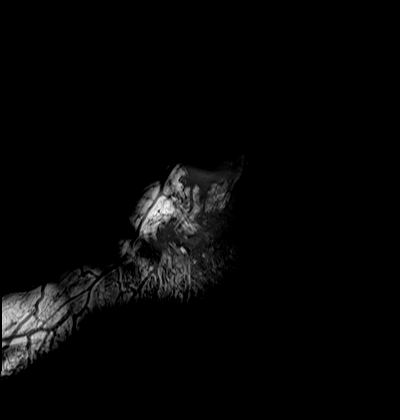

[Series 8: T1 fat-sat · axial · non-contrast · left · 3.0mm · 0.33mm/px · z∈[-51,+104]mm · 6 of 40 slices shown]
[im 1/40]
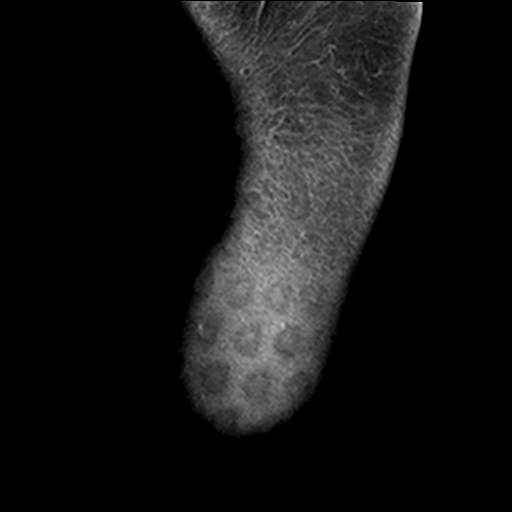
[im 8/40]
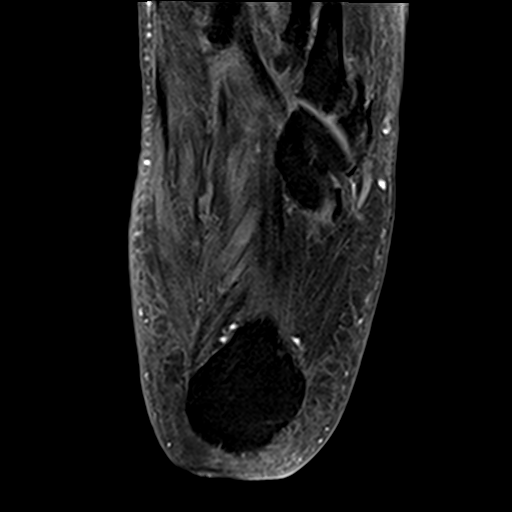
[im 16/40]
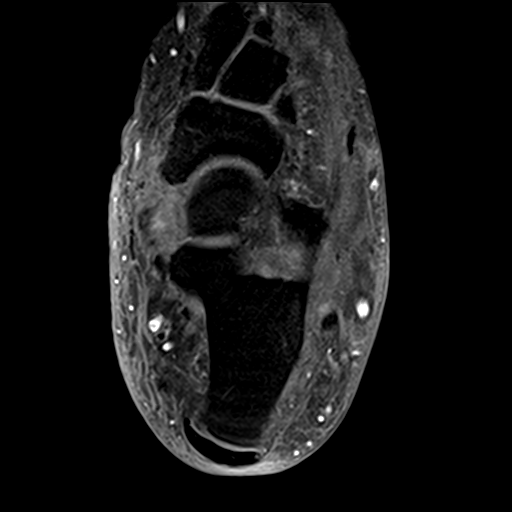
[im 24/40]
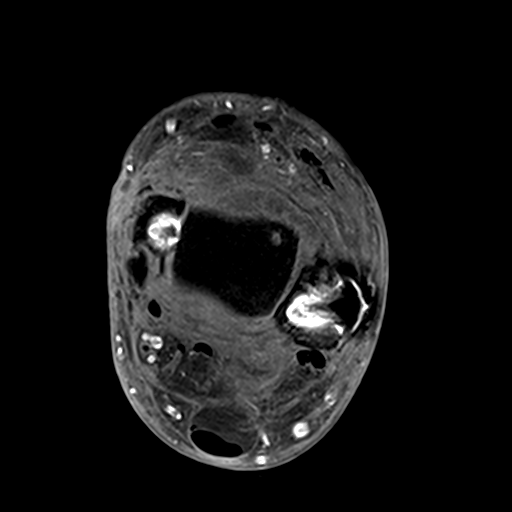
[im 32/40]
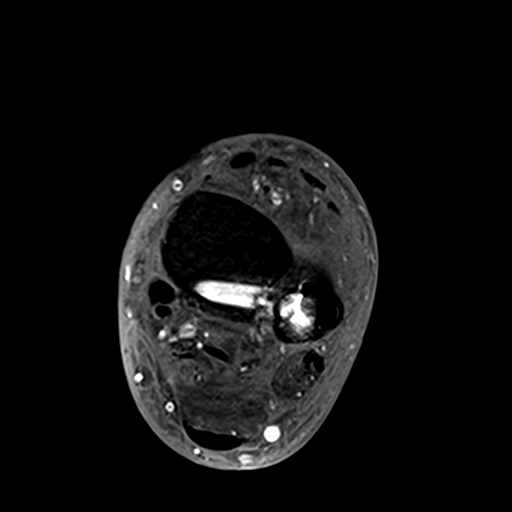
[im 40/40]
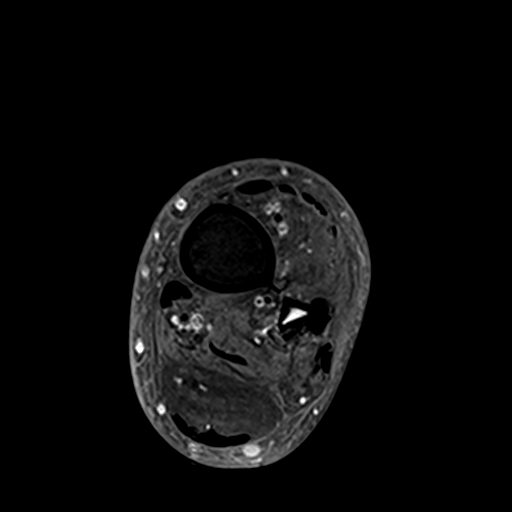

[26 of 40 positions shown; findings below may reference images not displayed]

FINDINGS: TENDONS

Peroneal: Peroneal longus tendon intact. Peroneal brevis intact.

Posteromedial: Posterior tibialis tendon thickening with
intrasubstance signal abnormality representing chronic tear. Flexor
hallucis longus tendon intact. Flexor digitorum longus tendon
intact.

Anterior: Tibialis anterior tendon intact. Extensor hallucis longus
tendon intact Extensor digitorum longus tendon intact.

Achilles:  Intact.

Plantar Fascia: Intact.

LIGAMENTS

Lateral: Anterior talofibular ligament intact. Calcaneofibular
ligament intact. Posterior talofibular ligament intact. Anterior and
posterior tibiofibular ligaments intact.

Medial: Limited evaluation due to susceptibility artifact.
Intermediate signal of the spring ligament concerning for
ligamentous sprain, which may be chronic process in the settings of
tibialis posterior tendinopathy/altered mechanics.

CARTILAGE

Ankle Joint: Small joint effusion. Full-thickness cartilage defect
about the posterolateral aspect of the tibiotalar joint at the
location of prior osseous erosion. There is however significant
decrease in bone marrow edema of the distal tibia. Subchondral
cystic changes of the lateral aspect of the talar dome with marrow
edema.

Subtalar Joints/Sinus Tarsi: Normal subtalar joints. Small subtalar
joint effusion. Edema of the fat in the sinus tarsi.

Bones: Postsurgical changes with plate and screw fixation of the
distal fibula. Surgical anchor in the medial malleolus. Bone marrow
edema of the lateral aspect of the talus the subchondral cystic
changes as described above. There is significant interval decrease
in marrow edema of the distal tibia.

Soft Tissue: Multiple small ganglion cysts about the anterior aspect
of the hardware at the distal fibula anterior aspect of the
tibiotalar joint.
IMPRESSION: IMPRESSION
1. Postsurgical changes about the distal fibula and medial malleolus
with significant interval decrease in marrow edema of the distal
tibia about the site of prior osseous erosion, likely representing
resolved infectious/inflammatory process.

2. Subchondral cystic changes about the lateral aspect of the talar
dome with surrounding edema, likely degenerative process due to
altered mechanics.

3. Moderate joint effusion about the tibiotalar and subtalar joints.

4. Tendinopathy/chronic tear of the tibialis posterior, remaining
tendons appear intact.

5. Ligamentous sprain of the spring ligament as well as
heterogeneous appearance of the deltoid ligament likely representing
ligamentous sprain in the setting of altered mechanics.

## 2021-04-23 MED ORDER — CHLORHEXIDINE GLUCONATE 4 % EX LIQD
60.0000 mL | Freq: Once | CUTANEOUS | Status: AC
  Administered 2021-04-24: 4 via TOPICAL
  Filled 2021-04-23: qty 15

## 2021-04-23 MED ORDER — INSULIN ASPART 100 UNIT/ML IJ SOLN
2.0000 [IU] | Freq: Three times a day (TID) | INTRAMUSCULAR | Status: DC
  Administered 2021-04-23 – 2021-04-24 (×2): 2 [IU] via SUBCUTANEOUS

## 2021-04-23 MED ORDER — GADOBUTROL 1 MMOL/ML IV SOLN
10.0000 mL | Freq: Once | INTRAVENOUS | Status: AC | PRN
  Administered 2021-04-23: 10 mL via INTRAVENOUS

## 2021-04-23 NOTE — Progress Notes (Signed)
Informed consent signed by patient, witnessed by  this writer and placed in patients medical record.

## 2021-04-23 NOTE — Progress Notes (Signed)
PROGRESS NOTE   [facility]  HQR:[id number] DOB: [birth year] DOA: 04/21/2021 PCP: Mayra Neer, MD   Chief Complaint  Patient presents with   Ankle Pain   Level of care: Med-Surg  Brief Admission History:  88 y.o. male with past medical history relevant for obesity, GERD, chronic anemia, HLD, who previously underwent  ORIF of his ankle September 13, 2020 -Postop developed osteomyelitis was treated for 6 to 8 weeks with IV antibiotics at that time - Additional history obtained from patient's daughter at bedside -No fever  Or chills    No Nausea, Vomiting or Diarrhea -- Complains of increasing discomfort/pain especially with weightbearing over the left ankle area, concerns of back left ankle area paresthesia as well- -No recent traumas or falls -No chest pains no palpitations no dizziness no shortness of breath -No leg pain otherwise except for left ankle pain diarrhea and no pleuritic type symptoms - ESR 42, up from 38 on 04/19/2021 -CRP was 28.8 on 04/19/2021, repeat CRP pending -WBC 6.2 Creatinine is 1.1 -Left ankle x-rays from 04/19/2021 noted-- intact hardware status post ORIF left ankle,  Bone changes from prior osteomyelitis -MRI of the left ankle with and without contrast to be done on Monday, 04/23/2021 - EDP discussed case with patient's orthopedic surgeon Dr. Aline Brochure who recommends admission to the hospital for IV vancomycin with possible operative intervention pending MRI findings from 04/23/2021  04/22/2021:  Pt agreeable to getting MRI with and without contrast on 2/20 and understands we don't have MRI on weekends at this hospital.  Pt also agreeable to PICC line if recommended by ortho.   04/23/2021:  pt had MRI of left ankle done, venous doppler of left leg neg for DVT, continues to complain of pain and discomfort left ankle, not ambulating.    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 Aline Brochure consult and  recommendations Tentative care plan  MRI with and without gadolinium on 2/20 IV vancomycin dosed per pharmacy to begin immediately Arrange for PICC line placement on 2/22 for 6 more weeks of IV antibiotics To OR with Dr. Aline Brochure on 2/21 to obtain cultures Anticipating DC home on culture specific IV antibiotics 2/23  Type 2 diabetes mellitus with neuropathy- (present on admission) Holding home metformin and Jardiance in hospital only Follow frequent CBG testing and supplemental SSI coverage.  Further recommendations pending more CBG data.  Carb modified Diet CBG (last 3)  Recent Labs    04/21/21 2124 04/22/21 0734 04/22/21 1103  GLUCAP 129* 159* 118*    Osteoarthritis- (present on admission) Acetaminophen ordered for pain and inflammation   Mixed hyperlipidemia- (present on admission) Pravastatin 80 mg daily resumed   Obesity (BMI 30-39.9)- (present on admission) Mobility is difficult due to left ankle infection, pain, inability to bear weight PT eval for bed exercises  Anemia, chronic disease- (present on admission) Hg stable at 11.   Follow intermittently.   Hearing loss Have patient repeat back what he has heard to be sure he understood the information.   Speak to patient with loud voice.  May need to remove mask so he can follow/read lips.   CKD (chronic kidney disease) stage 3, GFR 30-59 ml/min (HCC)- (present on admission) His GFR has improved.   Continue monitoring.    GERD (gastroesophageal reflux disease)- (present on admission) Protonix daily for GI protection   DVT prophylaxis: SQ heparin Code Status: Full  Family Communication:  Disposition: Status is: Inpatient Remains inpatient appropriate  because: IV antibiotics    Consultants:  Orthopedics Dr. Aline Brochure  Procedures:  Tentative OR with Dr Aline Brochure 2/21 Antimicrobials:  Vancomycin IV 2/18>>  Subjective: Pt reports that his left ankle is swollen and burning.  Objective: Vitals:   04/22/21  1215 04/22/21 2009 04/22/21 2103 04/23/21 0516  BP: 118/66 132/71 121/64 109/60  Pulse: 71 76 74 74  Resp: _0 Temp: 98.1 F (36.7 C) 98.6 F (37 C) 97.9 F (36.6 C) 97.9 F (36.6 C)  TempSrc: Oral Oral  Oral  SpO2: 97% 95% 97% 98%  Weight:      Height:        Intake/Output Summary (Last 24 hours) at 04/23/2021 1253 Last data filed at 04/23/2021 0825 Gross per 24 hour  Intake 610 ml  Output 2350 ml  Net -1740 ml   Filed Weights   04/21/21 1524  Weight: 136.1 kg   Examination:  General exam: awake, alert, cooperative, Appears calm and comfortable  Respiratory system: Clear to auscultation. Respiratory effort normal. Cardiovascular system: normal S1 & S2 heard. No JVD, murmurs, rubs, gallops or clicks. No pedal edema. Gastrointestinal system: Abdomen is nondistended, soft and nontender. No organomegaly or masses felt. Normal bowel sounds heard. Central nervous system: Alert and oriented. No focal neurological deficits. Extremities: swollen, tender left leg and ankle, mild erythema left ankle, pitting edema left ankle and left lower leg. Symmetric 5 x 5 power. Skin: No rashes, lesions or ulcers. Psychiatry: Judgement and insight appear normal. Mood & affect appropriate.   Data Reviewed: I have personally reviewed following labs and imaging studies  CBC: Recent Labs  Lab 04/19/21 1111 04/21/21 1653 04/22/21 0418 04/23/21 0426  WBC 6.0 6.2 5.2 5.0  NEUTROABS 4,260 4.8  --   --   HGB 12.2* 12.0* 11.2* 11.0*  HCT 39.4 41.3 37.2* 38.3*  MCV 83.5 87.1 87.3 86.8  PLT 235 204 222 540    Basic Metabolic Panel: Recent Labs  Lab 04/19/21 1111 04/21/21 1653 04/22/21 0418 04/23/21 0426  NA 139 137 134* 135  K 4.5 4.3 3.8 3.8  CL 104 104 105 104  CO2 _1 GLUCOSE 86 109* 109* 109*  BUN _2 CREATININE 0.94 1.14 0.95 0.93  CALCIUM 9.3 8.6* 8.2* 8.3*    CBG: Recent Labs  Lab 04/22/21 1103 04/22/21 1642 04/22/21 2104 04/23/21 0755  04/23/21 1112  GLUCAP 118* 156* 121* 108* 99    Recent Results (from the past 240 hour(s))  Resp Panel by RT-PCR (Flu A&B, Covid) Nasopharyngeal Swab     Status: None   Collection Time: 04/21/21  4:40 PM   Specimen: Nasopharyngeal Swab; Nasopharyngeal(NP) swabs in vial transport medium  Result Value Ref Range Status   SARS Coronavirus 2 by RT PCR NEGATIVE NEGATIVE Final    Comment: (NOTE) SARS-CoV-2 target nucleic acids are NOT DETECTED.  The SARS-CoV-2 RNA is generally detectable in upper respiratory specimens during the acute phase of infection. The lowest concentration of SARS-CoV-2 viral copies this assay can detect is 138 copies/mL. A negative result does not preclude SARS-Cov-2 infection and should not be used as the sole basis for treatment or other patient management decisions. A negative result may occur with  improper specimen collection/handling, submission of specimen other than nasopharyngeal swab, presence of viral mutation(s) within the areas targeted by this assay, and inadequate number of viral copies(<138 copies/mL). A negative result must be combined with clinical observations, patient history, and epidemiological information. The  expected result is Negative.  Fact Sheet for Patients:  EntrepreneurPulse.com.au  Fact Sheet for Healthcare Providers:  IncredibleEmployment.be  This test is no t yet approved or cleared by the [place] FDA and  has been authorized for detection and/or diagnosis of SARS-CoV-2 by FDA under an Emergency Use Authorization (EUA). This EUA will remain  in effect (meaning this test can be used) for the duration of the COVID-19 declaration under Section 564(b)(1) of the Act, 21 U.S.C.section 360bbb-3(b)(1), unless the authorization is terminated  or revoked sooner.       Influenza A by PCR NEGATIVE NEGATIVE Final   Influenza B by PCR NEGATIVE NEGATIVE Final    Comment: (NOTE) The Xpert Xpress  SARS-CoV-2/FLU/RSV plus assay is intended as an aid in the diagnosis of influenza from Nasopharyngeal swab specimens and should not be used as a sole basis for treatment. Nasal washings and aspirates are unacceptable for Xpert Xpress SARS-CoV-2/FLU/RSV testing.  Fact Sheet for Patients: EntrepreneurPulse.com.au  Fact Sheet for Healthcare Providers: IncredibleEmployment.be  This test is not yet approved or cleared by the [place] FDA and has been authorized for detection and/or diagnosis of SARS-CoV-2 by FDA under an Emergency Use Authorization (EUA). This EUA will remain in effect (meaning this test can be used) for the duration of the COVID-19 declaration under Section 564(b)(1) of the Act, 21 U.S.C. section 360bbb-3(b)(1), unless the authorization is terminated or revoked.  Performed at [facility], [address], [place], [place] [address]   Surgical pcr screen     Status: None   Collection Time: 04/23/21  1:21 AM   Specimen: Nasal Mucosa; Nasal Swab  Result Value Ref Range Status   MRSA, PCR NEGATIVE NEGATIVE Final   Staphylococcus aureus NEGATIVE NEGATIVE Final    Comment: (NOTE) The Xpert SA Assay (FDA approved for NASAL specimens in patients 55 years of age and older), is one component of a comprehensive surveillance program. It is not intended to diagnose infection nor to guide or monitor treatment. Performed at [facility], [address], [place], [place] [address]      Radiology Studies: MR ANKLE LEFT W WO CONTRAST  Result Date: 04/23/2021 CLINICAL DATA:  Aseptic necrosis left ankle EXAM: MRI OF THE LEFT ANKLE WITHOUT AND WITH CONTRAST TECHNIQUE: Multiplanar, multisequence MR imaging of the ankle was performed before and after the administration of intravenous contrast. CONTRAST:  88m GADAVIST GADOBUTROL 1 MMOL/ML IV SOLN COMPARISON:  None. FINDINGS: TENDONS Peroneal: Peroneal longus tendon intact. Peroneal brevis intact.  Posteromedial: Posterior tibialis tendon thickening with intrasubstance signal abnormality representing chronic tear. Flexor hallucis longus tendon intact. Flexor digitorum longus tendon intact. Anterior: Tibialis anterior tendon intact. Extensor hallucis longus tendon intact Extensor digitorum longus tendon intact. Achilles:  Intact. Plantar Fascia: Intact. LIGAMENTS Lateral: Anterior talofibular ligament intact. Calcaneofibular ligament intact. Posterior talofibular ligament intact. Anterior and posterior tibiofibular ligaments intact. Medial: Limited evaluation due to susceptibility artifact. Intermediate signal of the spring ligament concerning for ligamentous sprain, which may be chronic process in the settings of tibialis posterior tendinopathy/altered mechanics. CARTILAGE Ankle Joint: Small joint effusion. Full-thickness cartilage defect about the posterolateral aspect of the tibiotalar joint at the location of prior osseous erosion. There is however significant decrease in bone marrow edema of the distal tibia. Subchondral cystic changes of the lateral aspect of the talar dome with marrow edema. Subtalar Joints/Sinus Tarsi: Normal subtalar joints. Small subtalar joint effusion. Edema of the fat in the sinus tarsi. Bones: Postsurgical changes with plate and screw fixation of the  distal fibula. Surgical anchor in the medial malleolus. Bone marrow edema of the lateral aspect of the talus the subchondral cystic changes as described above. There is significant interval decrease in marrow edema of the distal tibia. Soft Tissue: Multiple small ganglion cysts about the anterior aspect of the hardware at the distal fibula anterior aspect of the tibiotalar joint. IMPRESSION: IMPRESSION 1. Postsurgical changes about the distal fibula and medial malleolus with significant interval decrease in marrow edema of the distal tibia about the site of prior osseous erosion, likely representing resolved infectious/inflammatory  process. 2. Subchondral cystic changes about the lateral aspect of the talar dome with surrounding edema, likely degenerative process due to altered mechanics. 3. Moderate joint effusion about the tibiotalar and subtalar joints. 4. Tendinopathy/chronic tear of the tibialis posterior, remaining tendons appear intact. 5. Ligamentous sprain of the spring ligament as well as heterogeneous appearance of the deltoid ligament likely representing ligamentous sprain in the setting of altered mechanics. Electronically Signed   By: Keane Police D.O.   On: 04/23/2021 10:59   US Venous Img Lower Unilateral Left (DVT)  Result Date: 04/23/2021 CLINICAL DATA:  Left lower extremity pain and edema. EXAM: LEFT LOWER EXTREMITY VENOUS DOPPLER ULTRASOUND TECHNIQUE: Gray-scale sonography with graded compression, as well as color Doppler and duplex ultrasound were performed to evaluate the lower extremity deep venous systems from the level of the common femoral vein and including the common femoral, femoral, profunda femoral, popliteal and calf veins including the posterior tibial, peroneal and gastrocnemius veins when visible. The superficial great saphenous vein was also interrogated. Spectral Doppler was utilized to evaluate flow at rest and with distal augmentation maneuvers in the common femoral, femoral and popliteal veins. COMPARISON:  None. FINDINGS: Contralateral Common Femoral Vein: Respiratory phasicity is normal and symmetric with the symptomatic side. No evidence of thrombus. Normal compressibility. Common Femoral Vein: No evidence of thrombus. Normal compressibility, respiratory phasicity and response to augmentation. Saphenofemoral Junction: No evidence of thrombus. Normal compressibility and flow on color Doppler imaging. Profunda Femoral Vein: No evidence of thrombus. Normal compressibility and flow on color Doppler imaging. Femoral Vein: No evidence of thrombus. Normal compressibility, respiratory phasicity and  response to augmentation. Popliteal Vein: No evidence of thrombus. Normal compressibility, respiratory phasicity and response to augmentation. Calf Veins: No evidence of thrombus. Normal compressibility and flow on color Doppler imaging. Superficial Great Saphenous Vein: No evidence of thrombus. Normal compressibility. Venous Reflux:  None. Other Findings: No evidence of superficial thrombophlebitis or abnormal fluid collection. IMPRESSION: No evidence of left lower extremity deep venous thrombosis. Electronically Signed   By: Aletta Edouard M.D.   On: 04/23/2021 10:43   [place] EKG SITE RITE  Result Date: 04/22/2021 If Site Rite image not attached, placement could not be confirmed due to current cardiac rhythm.   Scheduled Meds:  heparin  5,000 Units Subcutaneous Q8H   insulin aspart  0-5 Units Subcutaneous QHS   insulin aspart  0-6 Units Subcutaneous TID WC   insulin aspart  2 Units Subcutaneous TID WC   pantoprazole  40 mg Oral QHS   pravastatin  80 mg Oral Daily   sodium chloride flush  3 mL Intravenous Q12H   sodium chloride flush  3 mL Intravenous Q12H   tamsulosin  0.4 mg Oral QPC breakfast   Continuous Infusions:  sodium chloride     vancomycin 1,250 mg (04/23/21 0512)     LOS: 2 days   Time spent: 35 mins  Izella Ybanez, MD How to contact the  [facility] Attending or Consulting provider [place] or covering provider during after hours [place], for this patient?  Check the care team in [facility] and look for a) attending/consulting [facility] provider listed and b) the [facility] team listed Log into [email or web address] and use [place]'s universal password to access. If you do not have the password, please contact the hospital operator. Locate the [facility] provider you are looking for under [facility] and page to a number that you can be directly reached. If you still have difficulty reaching the provider, please page the [facility] (Director on Call) for the Hospitalists listed on amion for  assistance.  04/23/2021, 12:53 PM

## 2021-04-23 NOTE — Anesthesia Preprocedure Evaluation (Addendum)
Anesthesia Evaluation  Patient identified by MRN, date of birth, ID band Patient awake    Reviewed: Allergy & Precautions, NPO status , Patient's Chart, lab work & pertinent test results  Airway Mallampati: II  TM Distance: >3 FB Neck ROM: Full    Dental  (+) Upper Dentures, Lower Dentures   Pulmonary shortness of breath, with exertion and lying, COPD,    Pulmonary exam normal breath sounds clear to auscultation       Cardiovascular Exercise Tolerance: Poor Normal cardiovascular exam Rhythm:Regular Rate:Normal  28-Nov-2020 15:27:04 [facility] ROUTINE RECORD 1932/09/20 (61 yr) Male Caucasian Vent. rate 85 BPM PR interval 232 ms QRS duration 123 ms QT/QTcB 406/483 ms P-R-T axes 27 -23 113 Sinus rhythm Atrial premature complex Prolonged PR interval Left bundle branch block No significant change since last tracing Confirmed by Dorie Rank [phone]) on 11/28/2020 3:51:03 PM   Neuro/Psych negative neurological ROS  negative psych ROS   GI/Hepatic Neg liver ROS, GERD  Medicated and Controlled,  Endo/Other  diabetes, Well Controlled, Type 2, Oral Hypoglycemic Agents  Renal/GU Renal InsufficiencyRenal disease  negative genitourinary   Musculoskeletal  (+) Arthritis , Osteoarthritis,    Abdominal   Peds negative pediatric ROS (+)  Hematology  (+) Blood dyscrasia, anemia ,   Anesthesia Other Findings   Reproductive/Obstetrics negative OB ROS                            Anesthesia Physical Anesthesia Plan  ASA: 3  Anesthesia Plan: General   Post-op Pain Management: Dilaudid IV   Induction: Intravenous  PONV Risk Score and Plan: 3  Airway Management Planned: Oral ETT  Additional Equipment:   Intra-op Plan:   Post-operative Plan: Extubation in OR  Informed Consent: I have reviewed the patients History and Physical, chart, labs and discussed the procedure  including the risks, benefits and alternatives for the proposed anesthesia with the patient or authorized representative who has indicated his/her understanding and acceptance.     Dental advisory given  Plan Discussed with: CRNA and Surgeon  Anesthesia Plan Comments:         Anesthesia Quick Evaluation

## 2021-04-23 NOTE — TOC Initial Note (Addendum)
Transition of Care [facility]) - Initial/Assessment Note    Patient Details  Name: Angel Norman MRN: [id number] Date of Birth: [birth year]  Transition of Care [facility]) CM/SW Contact:    Salome Arnt, [place] Phone Number: 04/23/2021, 1:50 PM  Clinical Narrative:  Pt admitted due to acute on chronic osteomyelitis of the left ankle. Pt will require 6 weeks IV antibiotics. LCSW spoke with pt who reports he lives alone, but his daughter and granddaughter help him out a lot. He is aware of need for antibiotics and has done IV antibiotics at home in the past. He requests LCSW speak with daughter, Silva Bandy. Per Silva Bandy, pt is active with [facility] PT, OT, and RN. She indicates she has a lot of other family commitments right now and isn't sure how she will manage everything, but is not interested in SNF for her father. She also states pt would not want SNF. Phyllis requests as much help at home as possible with home health, but understands that they will teach family how to do antibiotics at home. She reports understanding and has done this for pt before. [place] notified of above. LCSW spoke with Pam with [facility] who will follow. Anticipate d/c in several days. TOC will continue to follow.                  Update: Per Marjory Lies, they are unable to staff RN for IV antibiotics. Referred and accepted by Tommi Rumps with [facility] for PT, OT, and RN. Pt's daughter updated and agreeable.   Expected Discharge Plan: [place] Barriers to Discharge: Continued Medical Work up   Patient Goals and CMS Choice Patient states their goals for this hospitalization and ongoing recovery are:: return home   Choice offered to / list presented to : Patient, Adult Children  Expected Discharge Plan and Services Expected Discharge Plan: [place] In-house Referral: Clinical Social Work   Post Acute Care Choice: [place] arrangements for the past 2 months:  [place] Arranged: RN, PT, OT, IV Antibiotics HH Agency: [place] Date [place]: 04/23/21 Time [place]: 1350 Representative spoke with at [place]: Marjory Lies ([place]) and Pam ([facility])  Prior Living Arrangements/Services Living arrangements for the past 2 months: [place] with:: Self Patient language and need for interpreter reviewed:: Yes Do you feel safe going back to the place where you live?: Yes      Need for Family Participation in Patient Care: Yes (Comment) Care giver support system in place?: Yes (comment) Current home services: Home PT, Home RN, Home OT Criminal Activity/Legal Involvement Pertinent to Current Situation/Hospitalization: No - Comment as needed  Activities of Daily Living Home Assistive Devices/Equipment: None ADL Screening (condition at time of admission) Patient's cognitive ability adequate to safely complete daily activities?: No Is the patient deaf or have difficulty hearing?: Yes Does the patient have difficulty seeing, even when wearing glasses/contacts?: No Does the patient have difficulty concentrating, remembering, or making decisions?: No Patient able to express need for assistance with ADLs?: Yes Does the patient have difficulty dressing or bathing?: Yes Independently performs ADLs?: Yes (appropriate for developmental age) Communication: Independent Dressing (OT): Independent Grooming: Independent Feeding: Independent Bathing: Independent Toileting: Independent In/Out Bed: Independent Walks in Home: Independent Is this a  change from baseline?: Pre-admission baseline Does the patient have difficulty walking or climbing stairs?: Yes (pt stated walks up about three steps on his back porch, when hes having leg pains he uses a cane) Weakness of Legs: Both Weakness of Arms/Hands: None  Permission Sought/Granted          Permission granted to share info w AGENCY: [place] granted to share info w Relationship: Home health     Emotional Assessment   Attitude/Demeanor/Rapport: Engaged Affect (typically observed): Accepting Orientation: : Oriented to Self, Oriented to Place, Oriented to Situation, Oriented to  Time Alcohol / Substance Use: Not Applicable Psych Involvement: No (comment)  Admission diagnosis:  Acute osteomyelitis of ankle ([place]) [M86.179] Left ankle effusion [M25.472] Patient Active Problem List   Diagnosis Date Noted   Anemia, chronic disease 04/22/2021   Acute on chronic osteomyelitis of left ankle 04/21/2021   Benign prostatic hyperplasia 01/04/2021   Edema 01/04/2021   Hearing loss 01/04/2021   Type 2 diabetes mellitus with neuropathy 01/04/2021   Syncope 11/28/2020   Nausea & vomiting 11/28/2020   Obesity (BMI 30-39.9) 11/28/2020   Diarrhea 11/28/2020   Hyperglycemia due to diabetes mellitus ([place]) 11/28/2020   Mixed hyperlipidemia 11/28/2020   Ankle wound, left, sequela 11/28/2020   Osteoarthritis 11/28/2020   Cellulitis of left ankle 10/16/2020   CKD (chronic kidney disease) stage 3, GFR 30-59 ml/min (HCC) 09/18/2020   Acute respiratory failure with hypoxia ([place]) 09/16/2020   Closed fracture of left ankle 09/12/2020   Anemia due to acute blood loss 05/09/2018   GERD (gastroesophageal reflux disease) 05/08/2018   AKI (acute kidney injury) ([place]) 05/07/2018   Blunt chest trauma 12/07/2014   PCP:  Mayra Neer, MD Pharmacy:   [id number] - [place], [address] [place] [address] Phone: [phone] Fax: [phone]  [id number] [place], [place] - [address] [place] [address] Phone: [phone] Fax: [phone]     Social Determinants of Health (SDOH) Interventions    Readmission Risk Interventions No flowsheet data  found.

## 2021-04-23 NOTE — Progress Notes (Signed)
Patient's urine cloudy, increased frequency. UA collected and resulted. MD notified.

## 2021-04-24 ENCOUNTER — Inpatient Hospital Stay (HOSPITAL_COMMUNITY): Payer: Medicare PPO | Admitting: Certified Registered"

## 2021-04-24 ENCOUNTER — Encounter (HOSPITAL_COMMUNITY): Payer: Self-pay | Admitting: Family Medicine

## 2021-04-24 ENCOUNTER — Other Ambulatory Visit: Payer: Self-pay

## 2021-04-24 ENCOUNTER — Encounter (HOSPITAL_COMMUNITY)
Admission: EM | Disposition: A | Discharge status: Home Health | Payer: Self-pay | Source: Home / Self Care | Attending: Family Medicine

## 2021-04-24 DIAGNOSIS — E119 Type 2 diabetes mellitus without complications: Secondary | ICD-10-CM

## 2021-04-24 DIAGNOSIS — S86112A Strain of other muscle(s) and tendon(s) of posterior muscle group at lower leg level, left leg, initial encounter: Secondary | ICD-10-CM

## 2021-04-24 DIAGNOSIS — N1831 Chronic kidney disease, stage 3a: Secondary | ICD-10-CM | POA: Diagnosis not present

## 2021-04-24 DIAGNOSIS — M659 Synovitis and tenosynovitis, unspecified: Secondary | ICD-10-CM | POA: Diagnosis not present

## 2021-04-24 DIAGNOSIS — D638 Anemia in other chronic diseases classified elsewhere: Secondary | ICD-10-CM | POA: Diagnosis not present

## 2021-04-24 DIAGNOSIS — M66872 Spontaneous rupture of other tendons, left ankle and foot: Principal | ICD-10-CM

## 2021-04-24 DIAGNOSIS — M86179 Other acute osteomyelitis, unspecified ankle and foot: Secondary | ICD-10-CM | POA: Diagnosis not present

## 2021-04-24 DIAGNOSIS — Z7984 Long term (current) use of oral hypoglycemic drugs: Secondary | ICD-10-CM

## 2021-04-24 DIAGNOSIS — S86119A Strain of other muscle(s) and tendon(s) of posterior muscle group at lower leg level, unspecified leg, initial encounter: Secondary | ICD-10-CM | POA: Diagnosis present

## 2021-04-24 DIAGNOSIS — K219 Gastro-esophageal reflux disease without esophagitis: Secondary | ICD-10-CM | POA: Diagnosis not present

## 2021-04-24 DIAGNOSIS — M65969 Unspecified synovitis and tenosynovitis, unspecified lower leg: Secondary | ICD-10-CM | POA: Diagnosis present

## 2021-04-24 HISTORY — PX: INCISION AND DRAINAGE: SHX5863

## 2021-04-24 LAB — CBC
HCT: 39.4 % (ref 39.0–52.0)
Hemoglobin: 11.3 g/dL — ABNORMAL LOW (ref 13.0–17.0)
MCH: 25.1 pg — ABNORMAL LOW (ref 26.0–34.0)
MCHC: 28.7 g/dL — ABNORMAL LOW (ref 30.0–36.0)
MCV: 87.6 fL (ref 80.0–100.0)
Platelets: 233 10*3/uL (ref 150–400)
RBC: 4.5 MIL/uL (ref 4.22–5.81)
RDW: 18.6 % — ABNORMAL HIGH (ref 11.5–15.5)
WBC: 5 10*3/uL (ref 4.0–10.5)
nRBC: 0 % (ref 0.0–0.2)

## 2021-04-24 LAB — GLUCOSE, CAPILLARY
Glucose-Capillary: 126 mg/dL — ABNORMAL HIGH (ref 70–99)
Glucose-Capillary: 104 mg/dL — ABNORMAL HIGH (ref 70–99)
Glucose-Capillary: 96 mg/dL (ref 70–99)
Glucose-Capillary: 152 mg/dL — ABNORMAL HIGH (ref 70–99)
Glucose-Capillary: 142 mg/dL — ABNORMAL HIGH (ref 70–99)

## 2021-04-24 SURGERY — INCISION AND DRAINAGE
Anesthesia: General | Site: Ankle | Laterality: Left

## 2021-04-24 MED ORDER — DOCUSATE SODIUM 100 MG PO CAPS
100.0000 mg | ORAL_CAPSULE | Freq: Two times a day (BID) | ORAL | Status: DC
  Administered 2021-04-24 – 2021-04-26 (×5): 100 mg via ORAL
  Filled 2021-04-24 (×5): qty 1

## 2021-04-24 MED ORDER — GABAPENTIN 100 MG PO CAPS
100.0000 mg | ORAL_CAPSULE | Freq: Three times a day (TID) | ORAL | Status: DC
Start: 2021-04-24 — End: 2021-04-26
  Administered 2021-04-24 – 2021-04-26 (×6): 100 mg via ORAL
  Filled 2021-04-24 (×6): qty 1

## 2021-04-24 MED ORDER — DEXMEDETOMIDINE (PRECEDEX) IN NS 20 MCG/5ML (4 MCG/ML) IV SYRINGE
PREFILLED_SYRINGE | INTRAVENOUS | Status: DC | PRN
  Administered 2021-04-24 (×2): 8 ug via INTRAVENOUS

## 2021-04-24 MED ORDER — PROPOFOL 10 MG/ML IV BOLUS
INTRAVENOUS | Status: DC | PRN
  Administered 2021-04-24: 150 mg via INTRAVENOUS
  Administered 2021-04-24: 20 mg via INTRAVENOUS
  Administered 2021-04-24 (×2): 30 mg via INTRAVENOUS

## 2021-04-24 MED ORDER — FENTANYL CITRATE (PF) 100 MCG/2ML IJ SOLN
INTRAMUSCULAR | Status: AC
  Filled 2021-04-24: qty 2

## 2021-04-24 MED ORDER — ROCURONIUM BROMIDE 100 MG/10ML IV SOLN
INTRAVENOUS | Status: DC | PRN
Start: 2021-04-24 — End: 2021-04-24
  Administered 2021-04-24: 30 mg via INTRAVENOUS

## 2021-04-24 MED ORDER — METOCLOPRAMIDE HCL 5 MG/ML IJ SOLN: 5.0000 mg | Freq: Three times a day (TID) | INTRAMUSCULAR | Status: DC | PRN

## 2021-04-24 MED ORDER — ROCURONIUM BROMIDE 10 MG/ML (PF) SYRINGE
PREFILLED_SYRINGE | INTRAVENOUS | Status: AC
  Filled 2021-04-24: qty 10

## 2021-04-24 MED ORDER — HYDROCODONE-ACETAMINOPHEN 7.5-325 MG PO TABS
1.0000 | ORAL_TABLET | ORAL | Status: DC | PRN
  Administered 2021-04-24: 1 via ORAL
  Administered 2021-04-24 – 2021-04-25 (×2): 2 via ORAL
  Filled 2021-04-24 (×3): qty 2
  Filled 2021-04-24: qty 1

## 2021-04-24 MED ORDER — CHLORHEXIDINE GLUCONATE 0.12 % MT SOLN: 15.0000 mL | Freq: Once | OROMUCOSAL | Status: DC

## 2021-04-24 MED ORDER — LACTATED RINGERS IV SOLN: INTRAVENOUS | Status: DC

## 2021-04-24 MED ORDER — MORPHINE SULFATE (PF) 2 MG/ML IV SOLN
0.5000 mg | INTRAVENOUS | Status: DC | PRN
  Administered 2021-04-24 (×2): 1 mg via INTRAVENOUS
  Filled 2021-04-24 (×2): qty 1

## 2021-04-24 MED ORDER — ACETAMINOPHEN 500 MG PO TABS
500.0000 mg | ORAL_TABLET | Freq: Four times a day (QID) | ORAL | Status: AC
Start: 2021-04-24 — End: 2021-04-25
  Administered 2021-04-24 – 2021-04-25 (×4): 500 mg via ORAL
  Filled 2021-04-24 (×4): qty 1

## 2021-04-24 MED ORDER — SODIUM CHLORIDE 0.9 % IV SOLN: INTRAVENOUS | Status: DC

## 2021-04-24 MED ORDER — ORAL CARE MOUTH RINSE: 15.0000 mL | Freq: Once | OROMUCOSAL | Status: DC

## 2021-04-24 MED ORDER — DEXMEDETOMIDINE (PRECEDEX) IN NS 20 MCG/5ML (4 MCG/ML) IV SYRINGE
PREFILLED_SYRINGE | INTRAVENOUS | Status: AC
  Filled 2021-04-24: qty 5

## 2021-04-24 MED ORDER — HEPARIN SODIUM (PORCINE) 5000 UNIT/ML IJ SOLN
5000.0000 [IU] | Freq: Three times a day (TID) | INTRAMUSCULAR | Status: DC
  Administered 2021-04-24 – 2021-04-26 (×6): 5000 [IU] via SUBCUTANEOUS
  Filled 2021-04-24 (×5): qty 1

## 2021-04-24 MED ORDER — POVIDONE-IODINE 10 % EX SWAB: 2.0000 "application " | Freq: Once | CUTANEOUS | Status: DC

## 2021-04-24 MED ORDER — ACETAMINOPHEN 325 MG PO TABS: 325.0000 mg | ORAL_TABLET | Freq: Four times a day (QID) | ORAL | Status: DC | PRN

## 2021-04-24 MED ORDER — SUCCINYLCHOLINE CHLORIDE 200 MG/10ML IV SOSY
PREFILLED_SYRINGE | INTRAVENOUS | Status: DC | PRN
  Administered 2021-04-24: 100 mg via INTRAVENOUS

## 2021-04-24 MED ORDER — PROPOFOL 10 MG/ML IV BOLUS
INTRAVENOUS | Status: AC
  Filled 2021-04-24: qty 20

## 2021-04-24 MED ORDER — LIDOCAINE HCL (CARDIAC) PF 50 MG/5ML IV SOSY
PREFILLED_SYRINGE | INTRAVENOUS | Status: DC | PRN
  Administered 2021-04-24: 50 mg via INTRAVENOUS

## 2021-04-24 MED ORDER — PHENYLEPHRINE 40 MCG/ML (10ML) SYRINGE FOR IV PUSH (FOR BLOOD PRESSURE SUPPORT)
PREFILLED_SYRINGE | INTRAVENOUS | Status: AC
  Filled 2021-04-24: qty 10

## 2021-04-24 MED ORDER — SODIUM CHLORIDE 0.9 % IR SOLN
Status: DC | PRN
  Administered 2021-04-24 (×2): 1000 mL

## 2021-04-24 MED ORDER — HYDROMORPHONE HCL 1 MG/ML IJ SOLN
0.2500 mg | INTRAMUSCULAR | Status: DC | PRN
  Administered 2021-04-24: 0.5 mg via INTRAVENOUS
  Filled 2021-04-24: qty 0.5

## 2021-04-24 MED ORDER — SUCCINYLCHOLINE CHLORIDE 200 MG/10ML IV SOSY
PREFILLED_SYRINGE | INTRAVENOUS | Status: AC
  Filled 2021-04-24: qty 10

## 2021-04-24 MED ORDER — LIDOCAINE HCL (PF) 2 % IJ SOLN
INTRAMUSCULAR | Status: AC
  Filled 2021-04-24: qty 5

## 2021-04-24 MED ORDER — MENTHOL 3 MG MT LOZG: 1.0000 | LOZENGE | OROMUCOSAL | Status: DC | PRN

## 2021-04-24 MED ORDER — ONDANSETRON HCL 4 MG/2ML IJ SOLN: 4.0000 mg | Freq: Once | INTRAMUSCULAR | Status: DC | PRN

## 2021-04-24 MED ORDER — PHENOL 1.4 % MT LIQD
1.0000 | OROMUCOSAL | Status: DC | PRN
  Filled 2021-04-24: qty 177

## 2021-04-24 MED ORDER — PROPOFOL 10 MG/ML IV BOLUS
INTRAVENOUS | Status: AC
Start: 2021-04-24 — End: ?
  Filled 2021-04-24: qty 20

## 2021-04-24 MED ORDER — LACTATED RINGERS IV SOLN: INTRAVENOUS | Status: DC | PRN

## 2021-04-24 MED ORDER — ONDANSETRON HCL 4 MG PO TABS: 4.0000 mg | ORAL_TABLET | Freq: Four times a day (QID) | ORAL | Status: DC | PRN

## 2021-04-24 MED ORDER — METOCLOPRAMIDE HCL 10 MG PO TABS: 5.0000 mg | ORAL_TABLET | Freq: Three times a day (TID) | ORAL | Status: DC | PRN

## 2021-04-24 MED ORDER — FENTANYL CITRATE (PF) 100 MCG/2ML IJ SOLN
INTRAMUSCULAR | Status: DC | PRN
  Administered 2021-04-24 (×2): 25 ug via INTRAVENOUS
  Administered 2021-04-24 (×2): 50 ug via INTRAVENOUS

## 2021-04-24 MED ORDER — SUGAMMADEX SODIUM 500 MG/5ML IV SOLN
INTRAVENOUS | Status: DC | PRN
  Administered 2021-04-24: 100 mg via INTRAVENOUS

## 2021-04-24 MED ORDER — ONDANSETRON HCL 4 MG/2ML IJ SOLN: 4.0000 mg | Freq: Four times a day (QID) | INTRAMUSCULAR | Status: DC | PRN

## 2021-04-24 MED ORDER — PHENYLEPHRINE HCL (PRESSORS) 10 MG/ML IV SOLN
INTRAVENOUS | Status: DC | PRN
Start: 2021-04-24 — End: 2021-04-24
  Administered 2021-04-24 (×3): 80 ug via INTRAVENOUS

## 2021-04-24 MED ORDER — POLYETHYLENE GLYCOL 3350 17 G PO PACK: 17.0000 g | PACK | Freq: Every day | ORAL | Status: DC | PRN

## 2021-04-24 SURGICAL SUPPLY — 48 items
APL PRP STRL LF DISP 70% ISPRP (MISCELLANEOUS) ×1
BANDAGE ELASTIC 4 VELCRO NS (GAUZE/BANDAGES/DRESSINGS) ×1 IMPLANT
BANDAGE ELASTIC 6 VELCRO NS (GAUZE/BANDAGES/DRESSINGS) ×1 IMPLANT
BANDAGE ESMARK 4X12 BL STRL LF (DISPOSABLE) ×1 IMPLANT
BLADE SURG 15 STRL LF DISP TIS (BLADE) IMPLANT
BLADE SURG 15 STRL SS (BLADE) ×2
BNDG CMPR 12X4 ELC STRL LF (DISPOSABLE) ×1
BNDG CMPR STD VLCR NS LF 5.8X6 (GAUZE/BANDAGES/DRESSINGS) ×1
BNDG COHESIVE 4X5 TAN ST LF (GAUZE/BANDAGES/DRESSINGS) ×1 IMPLANT
BNDG COHESIVE 4X5 TAN STRL (GAUZE/BANDAGES/DRESSINGS) ×2 IMPLANT
BNDG ELASTIC 6X5.8 VLCR NS LF (GAUZE/BANDAGES/DRESSINGS) ×1 IMPLANT
BNDG ESMARK 4X12 BLUE STRL LF (DISPOSABLE) ×2
CHLORAPREP W/TINT 26 (MISCELLANEOUS) ×1 IMPLANT
CLOTH BEACON ORANGE TIMEOUT ST (SAFETY) ×2 IMPLANT
COVER LIGHT HANDLE STERIS (MISCELLANEOUS) ×4 IMPLANT
CUFF TOURN SGL QUICK 34 (TOURNIQUET CUFF) ×2
CUFF TRNQT CYL 34X4.125X (TOURNIQUET CUFF) IMPLANT
ELECT REM PT RETURN 9FT ADLT (ELECTROSURGICAL) ×2
ELECTRODE REM PT RTRN 9FT ADLT (ELECTROSURGICAL) ×1 IMPLANT
GAUZE SPONGE 4X4 12PLY STRL (GAUZE/BANDAGES/DRESSINGS) ×4 IMPLANT
GAUZE SPONGE 4X4 12PLY STRL LF (GAUZE/BANDAGES/DRESSINGS) ×1 IMPLANT
GAUZE XEROFORM 5X9 LF (GAUZE/BANDAGES/DRESSINGS) ×1 IMPLANT
GLOVE SS N UNI LF 8.5 STRL (GLOVE) ×2 IMPLANT
GLOVE SURG POLYISO LF SZ8 (GLOVE) ×3 IMPLANT
GLOVE SURG UNDER POLY LF SZ7 (GLOVE) ×5 IMPLANT
GOWN STRL REUS W/TWL LRG LVL3 (GOWN DISPOSABLE) ×2 IMPLANT
GOWN STRL REUS W/TWL XL LVL3 (GOWN DISPOSABLE) ×2 IMPLANT
KIT TURNOVER KIT A (KITS) ×2 IMPLANT
MANIFOLD NEPTUNE II (INSTRUMENTS) ×2 IMPLANT
NDL HYPO 21X1.5 SAFETY (NEEDLE) IMPLANT
NEEDLE HYPO 21X1.5 SAFETY (NEEDLE) ×2 IMPLANT
NS IRRIG 1000ML POUR BTL (IV SOLUTION) ×3 IMPLANT
PACK BASIC LIMB (CUSTOM PROCEDURE TRAY) ×2 IMPLANT
PAD ABD 5X9 TENDERSORB (GAUZE/BANDAGES/DRESSINGS) ×1 IMPLANT
PAD ARMBOARD 7.5X6 YLW CONV (MISCELLANEOUS) ×2 IMPLANT
PADDING CAST COTTON 6X4 STRL (CAST SUPPLIES) ×1 IMPLANT
SET BASIN LINEN APH (SET/KITS/TRAYS/PACK) ×2 IMPLANT
STAPLER VISISTAT 35W (STAPLE) ×1 IMPLANT
SUT ETHIBOND NAB OS 4 #2 30IN (SUTURE) ×1 IMPLANT
SUT MON AB 0 CT1 (SUTURE) ×1 IMPLANT
SUT MON AB 2-0 CT1 36 (SUTURE) ×1 IMPLANT
SUT VIC AB 1 CT1 27 (SUTURE) ×2
SUT VIC AB 1 CT1 27XBRD ANTBC (SUTURE) IMPLANT
SWAB CULTURE ESWAB REG 1ML (MISCELLANEOUS) ×3 IMPLANT
SWAB CULTURE LIQ STUART DBL (MISCELLANEOUS) ×2 IMPLANT
SYR 10ML LL (SYRINGE) ×1 IMPLANT
SYR BULB IRRIG 60ML STRL (SYRINGE) ×2 IMPLANT
WATER STERILE IRR 1000ML POUR (IV SOLUTION) ×1 IMPLANT

## 2021-04-24 NOTE — Op Note (Addendum)
04/24/2021   9:27 AM   PATIENT:  Angel Norman  88 y.o. male   PRE-OPERATIVE DIAGNOSIS:  Osteomyelitis left ankle   POST-OPERATIVE DIAGNOSIS:  Partial rupture posterior tibial tendon   FINDINGS:  Subcutaneous tissue and flexor tendon sheath had no purulent material   The flexor tendon sheath on the medial side of the ankle was opened and there was extensive Tenosynovitis and intrasubstance tearing of the posterior tibial tendon at the level of the medial malleolus   The proximal portion of the tendon was intact as was the distal portion and the flexor digitorum was also intact  The joint was aspirated and there was no purulent material      PROCEDURE:  Procedure(s): Repair posterior tibial tendon with  Tenosynovectomy left ankle, and left ankle joint aspiration (Left)       Patient was seen in the preop area the surgical site was confirmed as left ankle and marked chart review was completed along with images  The patient was taken to the operating room for general anesthesia and was placed in the supine position  Left leg was prepared by placing a tourniquet on the thigh proximally and then it was prepped with ChloraPrep  Timeout was executed and procedure was confirmed for surgery on the left ankle  Limb was exsanguinated with a quite Esmarch the tourniquet is elevated to 275 mmHg.  The previous incision on the medial side of the ankle was marked out and extended proximally and distally.  The incision was made and sharp dissection was carried down through the subcutaneous tissue and then blunt dissection was carried out to the proximal portion of the flexor tendon sheath.  Further dissection was performed to find normal tissue and then traced the flexor tendon sheath down to the talonavicular area  The sheath was then opened and cultured  Inspection revealed that the tibialis posterior had intrasubstance degeneration and tearing there was extensive tenosynovitis around the  flexor tendon sheath which was (addendum) debrided with a sharp knife approximately 1-1/2 to 2 cm of tendinous tissue  The flexor digitorum was intact and there was no degeneration in that tendon  After further debridement of the torn tissue of the tibialis posterior the wound was irrigated.  Through a separate stab incision on 5 cc of fluid was injected into the ankle joint and then aspirated and sent for culture  The tibialis posterior tendon after debridement was tubularized with a #2 Ethibond suture  The flexor tendon sheath was loosely approximated with #1 Vicryl suture   Wound was closed with interrupted 0 Monocryl and 2-0 Monocryl suture and skin reapproximation was performed staples  Sterile dressings were applied along with a sugar-tong synthetic splint  The patient was extubated taken to recovery in stable condition  Postoperative plan is for the patient to be weightbearing as tolerated Convert to cam walking boot when the swelling goes down. Vancomycin will be continued until initial culture results come back Once the culture results are known determine if further antibiotic treatment is needed for if treatment can be tailored to the posterior tibial tendon dysfunction     SURGEON:  Surgeon(s) and Role:    * Vickki Hearing, MD - Primary   PHYSICIAN ASSISTANT:    ASSISTANTS: none    ANESTHESIA:   general   EBL:  15 mL    BLOOD ADMINISTERED:none   DRAINS: none    LOCAL MEDICATIONS USED:  NONE   SPECIMEN:  No Specimen   DISPOSITION OF  SPECIMEN:  N/A   COUNTS:  YES   TOURNIQUET:   Total Tourniquet Time Documented: Thigh (Left) - -387564 minutes Total: Thigh (Left) - -[id number] minutes     DICTATION: .Reubin Milan Dictation   PLAN OF CARE: Admit to inpatient    PATIENT DISPOSITION:  PACU - hemodynamically stable.   Delay start of Pharmacological VTE agent (>24hrs) due to surgical blood loss or risk of bleeding: not applicable

## 2021-04-24 NOTE — Brief Op Note (Signed)
04/24/2021 ° °9:27 AM ° °PATIENT:  Angel Norman  88 y.o. male ° °PRE-OPERATIVE DIAGNOSIS:  Osteomyelitis left ankle ° °POST-OPERATIVE DIAGNOSIS:  Partial rupture posterior tibial tendon ° °FINDINGS:  °Subcutaneous tissue and flexor tendon sheath had no purulent material ° °The flexor tendon sheath on the medial side of the ankle was opened and there was extensive Tenosynovitis and intrasubstance tearing of the posterior tibial tendon at the level of the medial malleolus ° °The proximal portion of the tendon was intact as was the distal portion and the flexor digitorum was also intact ° °The joint was aspirated and there was no purulent material ° ° ° °PROCEDURE:  Procedure(s): Repair posterior tibial tendon with  °Tenosynovectomy left ankle, and left ankle joint aspiration (Left) ° ° ° °Patient was seen in the preop area the surgical site was confirmed as left ankle and marked chart review was completed along with images ° °The patient was taken to the operating room for general anesthesia and was placed in the supine position ° °Left leg was prepared by placing a tourniquet on the thigh proximally and then it was prepped with ChloraPrep ° °Timeout was executed and procedure was confirmed for surgery on the left ankle ° °Limb was exsanguinated with a quite Esmarch the tourniquet is elevated to 275 mmHg. ° °The previous incision on the medial side of the ankle was marked out and extended proximally and distally.  The incision was made and sharp dissection was carried down through the subcutaneous tissue and then blunt dissection was carried out to the proximal portion of the flexor tendon sheath.  Further dissection was performed to find normal tissue and then traced the flexor tendon sheath down to the talonavicular area ° °The sheath was then opened and cultured ° °Inspection revealed that the tibialis posterior had intrasubstance degeneration and tearing there was extensive tenosynovitis around the flexor tendon  sheath which was debrided ° °The flexor digitorum was intact and there was no degeneration in that tendon ° °After further debridement of the torn tissue of the tibialis posterior the wound was irrigated.  Through a separate stab incision on 5 cc of fluid was injected into the ankle joint and then aspirated and sent for culture ° °The tibialis posterior tendon after debridement was tubularized with a #2 Ethibond suture ° °The flexor tendon sheath was loosely approximated with #1 Vicryl suture ° °Wound was closed with interrupted 0 Monocryl and 2-0 Monocryl suture and skin reapproximation was performed staples ° °Sterile dressings were applied along with a sugar-tong synthetic splint ° °The patient was extubated taken to recovery in stable condition ° °Postoperative plan is for the patient to be weightbearing as tolerated °Convert to cam walking boot when the swelling goes down. °Vancomycin will be continued until initial culture results come back °Once the culture results are known determine if further antibiotic treatment is needed for if treatment can be tailored to the posterior tibial tendon dysfunction ° ° °SURGEON:  Surgeon(s) and Role: °   * Maxene Byington E, MD - Primary ° °PHYSICIAN ASSISTANT:  ° °ASSISTANTS: none  ° °ANESTHESIA:   general ° °EBL:  15 mL  ° °BLOOD ADMINISTERED:none ° °DRAINS: none  ° °LOCAL MEDICATIONS USED:  NONE ° °SPECIMEN:  No Specimen ° °DISPOSITION OF SPECIMEN:  N/A ° °COUNTS:  YES ° °TOURNIQUET:   °Total Tourniquet Time Documented: °Thigh (Left) - -237539 minutes °Total: Thigh (Left) - -237539 minutes ° ° °DICTATION: .Dragon Dictation ° °PLAN OF CARE: Admit to   Total Tourniquet Time Documented: Thigh (Left) - -[id number] minutes Total: Thigh (Left) - [id number] minutes     DICTATION: .Viviann Spare Dictation   PLAN OF CARE: Admit to inpatient    PATIENT DISPOSITION:  PACU - hemodynamically stable.   Delay start of Pharmacological VTE agent (>24hrs) due to surgical blood loss or risk of bleeding: not applicable

## 2021-04-24 NOTE — Brief Op Note (Signed)
04/24/2021 ° °9:27 AM ° °PATIENT:  Angel Norman  88 y.o. male ° °PRE-OPERATIVE DIAGNOSIS:  Osteomyelitis left ankle ° °POST-OPERATIVE DIAGNOSIS:  Partial rupture posterior tibial tendon ° °FINDINGS:  °Subcutaneous tissue and flexor tendon sheath had no purulent material ° °The flexor tendon sheath on the medial side of the ankle was opened and there was extensive Tenosynovitis and intrasubstance tearing of the posterior tibial tendon at the level of the medial malleolus ° °The proximal portion of the tendon was intact as was the distal portion and the flexor digitorum was also intact ° °The joint was aspirated and there was no purulent material ° ° ° °PROCEDURE:  Procedure(s): Repair posterior tibial tendon with  °Tenosynovectomy left ankle, and left ankle joint aspiration (Left) ° ° ° °Patient was seen in the preop area the surgical site was confirmed as left ankle and marked chart review was completed along with images ° °The patient was taken to the operating room for general anesthesia and was placed in the supine position ° °Left leg was prepared by placing a tourniquet on the thigh proximally and then it was prepped with ChloraPrep ° °Timeout was executed and procedure was confirmed for surgery on the left ankle ° °Limb was exsanguinated with a quite Esmarch the tourniquet is elevated to 275 mmHg. ° °The previous incision on the medial side of the ankle was marked out and extended proximally and distally.  The incision was made and sharp dissection was carried down through the subcutaneous tissue and then blunt dissection was carried out to the proximal portion of the flexor tendon sheath.  Further dissection was performed to find normal tissue and then traced the flexor tendon sheath down to the talonavicular area ° °The sheath was then opened and cultured ° °Inspection revealed that the tibialis posterior had intrasubstance degeneration and tearing there was extensive tenosynovitis around the flexor tendon  sheath which was debrided ° °The flexor digitorum was intact and there was no degeneration in that tendon ° °After further debridement of the torn tissue of the tibialis posterior the wound was irrigated.  Through a separate stab incision on 5 cc of fluid was injected into the ankle joint and then aspirated and sent for culture ° °The tibialis posterior tendon after debridement was tubularized with a #2 Ethibond suture ° °The flexor tendon sheath was loosely approximated with #1 Vicryl suture ° °Wound was closed with interrupted 0 Monocryl and 2-0 Monocryl suture and skin reapproximation was performed staples ° °Sterile dressings were applied along with a sugar-tong synthetic splint ° °The patient was extubated taken to recovery in stable condition ° °Postoperative plan is for the patient to be weightbearing as tolerated °Convert to cam walking boot when the swelling goes down. °Vancomycin will be continued until initial culture results come back °Once the culture results are known determine if further antibiotic treatment is needed for if treatment can be tailored to the posterior tibial tendon dysfunction ° ° °SURGEON:  Surgeon(s) and Role: °   * Lakea Mittelman E, MD - Primary ° °PHYSICIAN ASSISTANT:  ° °ASSISTANTS: none  ° °ANESTHESIA:   general ° °EBL:  15 mL  ° °BLOOD ADMINISTERED:none ° °DRAINS: none  ° °LOCAL MEDICATIONS USED:  NONE ° °SPECIMEN:  No Specimen ° °DISPOSITION OF SPECIMEN:  N/A ° °COUNTS:  YES ° °TOURNIQUET:   °Total Tourniquet Time Documented: °Thigh (Left) - -237539 minutes °Total: Thigh (Left) - -237539 minutes ° ° °DICTATION: .Dragon Dictation ° °PLAN OF CARE: Admit to   inpatient  ° °PATIENT DISPOSITION:  PACU - hemodynamically stable. °  °Delay start of Pharmacological VTE agent (>24hrs) due to surgical blood loss or risk of bleeding: not applicable ° °

## 2021-04-24 NOTE — Anesthesia Postprocedure Evaluation (Signed)
Anesthesia Post Note  Patient: Angel Norman [facility]  Procedure(s) Performed: tenosynovectomy, left ankle joint aspiration (Left: Ankle)  Patient location during evaluation: PACU Anesthesia Type: General Level of consciousness: awake and alert and oriented Pain management: pain level controlled Vital Signs Assessment: post-procedure vital signs reviewed and stable Respiratory status: spontaneous breathing, nonlabored ventilation and respiratory function stable Cardiovascular status: blood pressure returned to baseline and stable Postop Assessment: no apparent nausea or vomiting Anesthetic complications: no   No notable events documented.   Last Vitals:  Vitals:   04/24/21 1045 04/24/21 1100  BP: 125/69 124/70  Pulse: 71 69  Resp: 10 18  Temp: 36.5 C 36.6 C  SpO2: 97% 99%    Last Pain:  Vitals:   04/24/21 1100  TempSrc: Oral  PainSc: 9                  [name]

## 2021-04-24 NOTE — Transfer of Care (Signed)
Immediate Anesthesia Transfer of Care Note  Patient: Angel Norman [facility]  Procedure(s) Performed: tenosynovectomy, left ankle joint aspiration (Left: Ankle)  Patient Location: PACU  Anesthesia Type:General  Level of Consciousness: awake and patient cooperative  Airway & Oxygen Therapy: Patient Spontanous Breathing and non-rebreather face mask  Post-op Assessment: Report given to RN and Post -op Vital signs reviewed and stable  Post vital signs: Reviewed and stable  Last Vitals:  Vitals Value Taken Time  BP 95/76   Temp 97.8   Pulse 75 04/24/21 0929  Resp 14 04/24/21 0929  SpO2 99 % 04/24/21 0929  Vitals shown include unvalidated device data.  Last Pain:  Vitals:   04/24/21 0624  TempSrc: Oral  PainSc: 0-No pain      Patients Stated Pain Goal: 0 (04/21/21 2118)  Complications: No notable events documented.

## 2021-04-24 NOTE — Assessment & Plan Note (Signed)
S/p tenosynovectomy by Dr Romeo Apple on 2/21

## 2021-04-24 NOTE — Assessment & Plan Note (Addendum)
Pt went to OR with Dr. Romeo Apple on 2/21 S/p repair in OR 04/24/21 Follow up culture to determine if antibiotics needed Postop plan is:   weightbearing as tolerated  Convert to cam walking boot when edema improved  Continue now on doxycycline as prescribed for 2 weeks with 2-week follow-up

## 2021-04-24 NOTE — Anesthesia Procedure Notes (Signed)
Procedure Name: Intubation Date/Time: 04/24/2021 7:46 AM Performed by: Vista Deck, CRNA Pre-anesthesia Checklist: Patient identified, Patient being monitored, Timeout performed, Emergency Drugs available and Suction available Patient Re-evaluated:Patient Re-evaluated prior to induction Oxygen Delivery Method: Circle system utilized Preoxygenation: Pre-oxygenation with 100% oxygen Induction Type: IV induction Ventilation: Mask ventilation without difficulty Laryngoscope Size: Mac and 3 Grade View: Grade I Tube type: Oral Tube size: 7.5 mm Number of attempts: 1 Airway Equipment and Method: Stylet and Bite block Placement Confirmation: ETT inserted through vocal cords under direct vision, positive ETCO2 and breath sounds checked- equal and bilateral Secured at: 23 cm Tube secured with: Tape Dental Injury: Teeth and Oropharynx as per pre-operative assessment

## 2021-04-24 NOTE — Interval H&P Note (Signed)
History and Physical Interval Note:  04/24/2021 7:19 AM  Angel Norman  has presented today for surgery, with the diagnosis of Osteomyelitis left ankle.  The various methods of treatment have been discussed with the patient and family. After consideration of risks, benefits and other options for treatment, the patient has consented to  Procedure(s) with comments: INCISION AND DRAINAGE left ankle (Left) - Culture anaerobic and aerobic, synthetic splinting material, regular dressings, as a surgical intervention.  The patient's history has been reviewed, patient examined, no change in status, stable for surgery.  I have reviewed the patient's chart and labs.  Questions were answered to the patient's satisfaction.     Arther Abbott

## 2021-04-24 NOTE — Brief Op Note (Signed)
04/24/2021  9:27 AM  PATIENT:  Angel Norman  88 y.o. male  PRE-OPERATIVE DIAGNOSIS:  Osteomyelitis left ankle  POST-OPERATIVE DIAGNOSIS:  Partial rupture posterior tibial tendon  FINDINGS:  Subcutaneous tissue and flexor tendon sheath had no purulent material  The flexor tendon sheath on the medial side of the ankle was opened and there was extensive Tenosynovitis and intrasubstance tearing of the posterior tibial tendon at the level of the medial malleolus  The proximal portion of the tendon was intact as was the distal portion and the flexor digitorum was also intact  The joint was aspirated and there was no purulent material    PROCEDURE:  Procedure(s): Repair posterior tibial tendon with  Tenosynovectomy left ankle, and left ankle joint aspiration (Left)    Patient was seen in the preop area the surgical site was confirmed as left ankle and marked chart review was completed along with images  The patient was taken to the operating room for general anesthesia and was placed in the supine position  Left leg was prepared by placing a tourniquet on the thigh proximally and then it was prepped with ChloraPrep  Timeout was executed and procedure was confirmed for surgery on the left ankle  Limb was exsanguinated with a quite Esmarch the tourniquet is elevated to 275 mmHg.  The previous incision on the medial side of the ankle was marked out and extended proximally and distally.  The incision was made and sharp dissection was carried down through the subcutaneous tissue and then blunt dissection was carried out to the proximal portion of the flexor tendon sheath.  Further dissection was performed to find normal tissue and then traced the flexor tendon sheath down to the talonavicular area  The sheath was then opened and cultured  Inspection revealed that the tibialis posterior had intrasubstance degeneration and tearing there was extensive tenosynovitis around the flexor tendon  sheath which was debrided  The flexor digitorum was intact and there was no degeneration in that tendon  After further debridement of the torn tissue of the tibialis posterior the wound was irrigated.  Through a separate stab incision on 5 cc of fluid was injected into the ankle joint and then aspirated and sent for culture  The tibialis posterior tendon after debridement was tubularized with a #2 Ethibond suture  The flexor tendon sheath was loosely approximated with #1 Vicryl suture  Wound was closed with interrupted 0 Monocryl and 2-0 Monocryl suture and skin reapproximation was performed staples  Sterile dressings were applied along with a sugar-tong synthetic splint  The patient was extubated taken to recovery in stable condition  Postoperative plan is for the patient to be weightbearing as tolerated Convert to cam walking boot when the swelling goes down. Vancomycin will be continued until initial culture results come back Once the culture results are known determine if further antibiotic treatment is needed for if treatment can be tailored to the posterior tibial tendon dysfunction   SURGEON:  Surgeon(s) and Role:    * Carole Civil, MD - Primary  PHYSICIAN ASSISTANT:   ASSISTANTS: none   ANESTHESIA:   general  EBL:  15 mL   BLOOD ADMINISTERED:none  DRAINS: none   LOCAL MEDICATIONS USED:  NONE  SPECIMEN:  No Specimen  DISPOSITION OF SPECIMEN:  N/A  COUNTS:  YES  TOURNIQUET:   Total Tourniquet Time Documented: Thigh (Left) - [id number] minutes Total: Thigh (Left) - [id number] minutes   DICTATION: .Viviann Spare Dictation  PLAN OF CARE: Admit to  inpatient   PATIENT DISPOSITION:  PACU - hemodynamically stable.   Delay start of Pharmacological VTE agent (>24hrs) due to surgical blood loss or risk of bleeding: not applicable

## 2021-04-24 NOTE — Progress Notes (Signed)
PROGRESS NOTE   Angel Norman [facility]  ZJI:[id number] DOB: Nov 23, 1932 DOA: 04/21/2021 PCP: Mayra Neer, MD   Chief Complaint  Patient presents with   Ankle Pain   Level of care: Telemetry  Brief Admission History:  88 y.o. male with past medical history relevant for obesity, GERD, chronic anemia, HLD, who previously underwent  ORIF of his ankle September 13, 2020 -Postop developed osteomyelitis was treated for 6 to 8 weeks with IV antibiotics at that time  Additional history obtained from patient's daughter at bedside -No fever  Or chills  No Nausea, Vomiting or Diarrhea -- Complains of increasing discomfort/pain especially with weightbearing over the left ankle area, concerns of back left ankle area paresthesia as well- -No recent traumas or falls -No chest pains no palpitations no dizziness no shortness of breath -No leg pain otherwise except for left ankle pain diarrhea and no pleuritic type symptoms  ESR 42, up from 38 on 04/19/2021 -CRP was 28.8 on 04/19/2021, repeat CRP pending -WBC 6.2 Creatinine is 1.1 -Left ankle x-rays from 04/19/2021 noted-- intact hardware status post ORIF left ankle,  Bone changes from prior osteomyelitis -MRI of the left ankle with and without contrast to be done on Monday, 04/23/2021 - EDP discussed case with patient's orthopedic surgeon Dr. Aline Brochure who recommends admission to the hospital for IV vancomycin with possible operative intervention pending MRI findings from 04/23/2021  04/22/2021:  Pt agreeable to getting MRI with and without contrast on 2/20 and understands we don't have MRI on weekends at this hospital.  Pt also agreeable to PICC line if recommended by ortho.   04/23/2021:  pt had MRI of left ankle done, venous doppler of left leg neg for DVT, continues to complain of pain and discomfort left ankle, not ambulating.   04/24/2021: Pt went to OR with Dr. Aline Brochure: partial rupture posterior tibial tendon s/p repair, tenoynovectomy left ankle, and left ankle  joint aspiration.     Assessment and Plan: * Tibialis posterior tendon partial rupture- (present on admission) Pt went to OR with Dr. Aline Brochure on 2/21 S/p repair in OR 04/24/21 Follow up culture to determine if antibiotics needed Postop plan is:  weightbearing as tolerated Convert to cam walking boot when edema improved Continue vancomycin until culture results available  Tenosynovitis of tibialis posterior tendon- (present on admission) S/p tenosynovectomy by Dr Aline Brochure on 2/21  Working Diagnosis of Acute on chronic osteomyelitis of left ankle - being ruled out- (present on admission) Appreciate Dr. Aline Brochure consult and recommendations Plan of care  MRI with and without gadolinium on 2/20 was reassuring that infection may be resolved IV vancomycin dosed per pharmacy to begin immediately If infection is present possible PICC line placement for home IV antibiotics To OR with Dr. Aline Brochure on 2/21 for cultures; follow up culture results to determine if further abx needed.   Type 2 diabetes mellitus with neuropathy- (present on admission) Holding home metformin and Jardiance in hospital only Follow frequent CBG testing and supplemental SSI coverage.  Further recommendations pending more CBG data.  Carb modified Diet CBG (last 3)  Recent Labs    04/24/21 0628 04/24/21 1014 04/24/21 1129  GLUCAP 126* 104* 96    Osteoarthritis- (present on admission) Acetaminophen ordered for pain and inflammation   Mixed hyperlipidemia- (present on admission) Pravastatin 80 mg daily resumed   Obesity (BMI 30-39.9)- (present on admission) Mobility is difficult due to left ankle pain, weight bearing as tolerated PT to see 04/25/21  Anemia, chronic disease- (present on admission)  Hg stable at 11.   Follow CBC in AM.   Hearing loss Have patient repeat back what he has heard to be sure he understood the information.   Speak to patient with loud voice.  May need to remove mask so he can  follow/read lips.   CKD (chronic kidney disease) stage 3, GFR 30-59 ml/min (HCC)- (present on admission) His GFR has improved.   Continue monitoring.    GERD (gastroesophageal reflux disease)- (present on admission) Protonix daily for GI protection   DVT prophylaxis: SQ heparin Code Status: Full  Family Communication: bedside update given 2/21 Disposition: Status is: Inpatient Remains inpatient appropriate because: IV antibiotics    Consultants:  Orthopedics Dr. Aline Brochure  Procedures:  Tentative OR with Dr Aline Brochure 2/21 Antimicrobials:  Vancomycin IV 2/18>>  Subjective: Pt seen after surgery and has no complaints but is somnolent after eating lunch.   Objective: Vitals:   04/24/21 1015 04/24/21 1030 04/24/21 1045 04/24/21 1100  BP: 100/60 121/60 125/69 124/70  Pulse: 70 67 71 69  Resp: _0 Temp:   97.7 F (36.5 C) 97.8 F (36.6 C)  TempSrc:    Oral  SpO2: 96% 100% 97% 99%  Weight:      Height:        Intake/Output Summary (Last 24 hours) at 04/24/2021 1255 Last data filed at 04/24/2021 1031 Gross per 24 hour  Intake 1871.08 ml  Output 2165 ml  Net -293.92 ml   Filed Weights   04/21/21 1524 04/24/21 0624  Weight: 136.1 kg 136 kg   Examination:  General exam: awake, somnolent, cooperative, Appears calm and comfortable  Respiratory system: Clear to auscultation. Respiratory effort normal. Cardiovascular system: normal S1 & S2 heard. No JVD, murmurs, rubs, gallops or clicks. No pedal edema. Gastrointestinal system: Abdomen is nondistended, soft and nontender. No organomegaly or masses felt. Normal bowel sounds heard. Central nervous system: Alert and oriented. No focal neurological deficits. Extremities: left ankle bandaged postop Skin: No rashes, lesions or ulcers. Psychiatry: Judgement and insight appear normal. Mood & affect appropriate.   Data Reviewed: I have personally reviewed following labs and imaging studies  CBC: Recent Labs  Lab  04/19/21 1111 04/21/21 1653 04/22/21 0418 04/23/21 0426 04/24/21 0414  WBC 6.0 6.2 5.2 5.0 5.0  NEUTROABS 4,260 4.8  --   --   --   HGB 12.2* 12.0* 11.2* 11.0* 11.3*  HCT 39.4 41.3 37.2* 38.3* 39.4  MCV 83.5 87.1 87.3 86.8 87.6  PLT 235 204 222 205 102    Basic Metabolic Panel: Recent Labs  Lab 04/19/21 1111 04/21/21 1653 04/22/21 0418 04/23/21 0426  NA 139 137 134* 135  K 4.5 4.3 3.8 3.8  CL 104 104 105 104  CO2 _1 GLUCOSE 86 109* 109* 109*  BUN _2 CREATININE 0.94 1.14 0.95 0.93  CALCIUM 9.3 8.6* 8.2* 8.3*    CBG: Recent Labs  Lab 04/23/21 1600 04/23/21 2151 04/24/21 0628 04/24/21 1014 04/24/21 1129  GLUCAP 103* 119* 126* 104* 96    Recent Results (from the past 240 hour(s))  Resp Panel by RT-PCR (Flu A&B, Covid) Nasopharyngeal Swab     Status: None   Collection Time: 04/21/21  4:40 PM   Specimen: Nasopharyngeal Swab; Nasopharyngeal(NP) swabs in vial transport medium  Result Value Ref Range Status   SARS Coronavirus 2 by RT PCR NEGATIVE NEGATIVE Final    Comment: (NOTE) SARS-CoV-2 target nucleic acids are NOT DETECTED.  The  SARS-CoV-2 RNA is generally detectable in upper respiratory specimens during the acute phase of infection. The lowest concentration of SARS-CoV-2 viral copies this assay can detect is 138 copies/mL. A negative result does not preclude SARS-Cov-2 infection and should not be used as the sole basis for treatment or other patient management decisions. A negative result may occur with  improper specimen collection/handling, submission of specimen other than nasopharyngeal swab, presence of viral mutation(s) within the areas targeted by this assay, and inadequate number of viral copies(<138 copies/mL). A negative result must be combined with clinical observations, patient history, and epidemiological information. The expected result is Negative.  Fact Sheet for Patients:   EntrepreneurPulse.com.au  Fact Sheet for Healthcare Providers:  IncredibleEmployment.be  This test is no t yet approved or cleared by the [place] FDA and  has been authorized for detection and/or diagnosis of SARS-CoV-2 by FDA under an Emergency Use Authorization (EUA). This EUA will remain  in effect (meaning this test can be used) for the duration of the COVID-19 declaration under Section 564(b)(1) of the Act, 21 U.S.C.section 360bbb-3(b)(1), unless the authorization is terminated  or revoked sooner.       Influenza A by PCR NEGATIVE NEGATIVE Final   Influenza B by PCR NEGATIVE NEGATIVE Final    Comment: (NOTE) The Xpert Xpress SARS-CoV-2/FLU/RSV plus assay is intended as an aid in the diagnosis of influenza from Nasopharyngeal swab specimens and should not be used as a sole basis for treatment. Nasal washings and aspirates are unacceptable for Xpert Xpress SARS-CoV-2/FLU/RSV testing.  Fact Sheet for Patients: EntrepreneurPulse.com.au  Fact Sheet for Healthcare Providers: IncredibleEmployment.be  This test is not yet approved or cleared by the [place] FDA and has been authorized for detection and/or diagnosis of SARS-CoV-2 by FDA under an Emergency Use Authorization (EUA). This EUA will remain in effect (meaning this test can be used) for the duration of the COVID-19 declaration under Section 564(b)(1) of the Act, 21 U.S.C. section 360bbb-3(b)(1), unless the authorization is terminated or revoked.  Performed at [facility], [address], [place], [place] [address]   Surgical pcr screen     Status: None   Collection Time: 04/23/21  1:21 AM   Specimen: Nasal Mucosa; Nasal Swab  Result Value Ref Range Status   MRSA, PCR NEGATIVE NEGATIVE Final   Staphylococcus aureus NEGATIVE NEGATIVE Final    Comment: (NOTE) The Xpert SA Assay (FDA approved for NASAL specimens in patients 76 years of  age and older), is one component of a comprehensive surveillance program. It is not intended to diagnose infection nor to guide or monitor treatment. Performed at [facility], [address] [place],  [address]      Radiology Studies: MR ANKLE LEFT W WO CONTRAST  Result Date: 04/23/2021 CLINICAL DATA:  Aseptic necrosis left ankle EXAM: MRI OF THE LEFT ANKLE WITHOUT AND WITH CONTRAST TECHNIQUE: Multiplanar, multisequence MR imaging of the ankle was performed before and after the administration of intravenous contrast. CONTRAST:  8m GADAVIST GADOBUTROL 1 MMOL/ML IV SOLN COMPARISON:  None. FINDINGS: TENDONS Peroneal: Peroneal longus tendon intact. Peroneal brevis intact. Posteromedial: Posterior tibialis tendon thickening with intrasubstance signal abnormality representing chronic tear. Flexor hallucis longus tendon intact. Flexor digitorum longus tendon intact. Anterior: Tibialis anterior tendon intact. Extensor hallucis longus tendon intact Extensor digitorum longus tendon intact. Achilles:  Intact. Plantar Fascia: Intact. LIGAMENTS Lateral: Anterior talofibular ligament intact. Calcaneofibular ligament intact. Posterior talofibular ligament intact. Anterior and posterior tibiofibular ligaments intact. Medial: Limited evaluation due to susceptibility  artifact. Intermediate signal of the spring ligament concerning for ligamentous sprain, which may be chronic process in the settings of tibialis posterior tendinopathy/altered mechanics. CARTILAGE Ankle Joint: Small joint effusion. Full-thickness cartilage defect about the posterolateral aspect of the tibiotalar joint at the location of prior osseous erosion. There is however significant decrease in bone marrow edema of the distal tibia. Subchondral cystic changes of the lateral aspect of the talar dome with marrow edema. Subtalar Joints/Sinus Tarsi: Normal subtalar joints. Small subtalar joint effusion. Edema of the fat in the sinus tarsi. Bones:  Postsurgical changes with plate and screw fixation of the distal fibula. Surgical anchor in the medial malleolus. Bone marrow edema of the lateral aspect of the talus the subchondral cystic changes as described above. There is significant interval decrease in marrow edema of the distal tibia. Soft Tissue: Multiple small ganglion cysts about the anterior aspect of the hardware at the distal fibula anterior aspect of the tibiotalar joint. IMPRESSION: IMPRESSION 1. Postsurgical changes about the distal fibula and medial malleolus with significant interval decrease in marrow edema of the distal tibia about the site of prior osseous erosion, likely representing resolved infectious/inflammatory process. 2. Subchondral cystic changes about the lateral aspect of the talar dome with surrounding edema, likely degenerative process due to altered mechanics. 3. Moderate joint effusion about the tibiotalar and subtalar joints. 4. Tendinopathy/chronic tear of the tibialis posterior, remaining tendons appear intact. 5. Ligamentous sprain of the spring ligament as well as heterogeneous appearance of the deltoid ligament likely representing ligamentous sprain in the setting of altered mechanics. Electronically Signed   By: Keane Police D.O.   On: 04/23/2021 10:59   US Venous Img Lower Unilateral Left (DVT)  Result Date: 04/23/2021 CLINICAL DATA:  Left lower extremity pain and edema. EXAM: LEFT LOWER EXTREMITY VENOUS DOPPLER ULTRASOUND TECHNIQUE: Gray-scale sonography with graded compression, as well as color Doppler and duplex ultrasound were performed to evaluate the lower extremity deep venous systems from the level of the common femoral vein and including the common femoral, femoral, profunda femoral, popliteal and calf veins including the posterior tibial, peroneal and gastrocnemius veins when visible. The superficial great saphenous vein was also interrogated. Spectral Doppler was utilized to evaluate flow at rest and with  distal augmentation maneuvers in the common femoral, femoral and popliteal veins. COMPARISON:  None. FINDINGS: Contralateral Common Femoral Vein: Respiratory phasicity is normal and symmetric with the symptomatic side. No evidence of thrombus. Normal compressibility. Common Femoral Vein: No evidence of thrombus. Normal compressibility, respiratory phasicity and response to augmentation. Saphenofemoral Junction: No evidence of thrombus. Normal compressibility and flow on color Doppler imaging. Profunda Femoral Vein: No evidence of thrombus. Normal compressibility and flow on color Doppler imaging. Femoral Vein: No evidence of thrombus. Normal compressibility, respiratory phasicity and response to augmentation. Popliteal Vein: No evidence of thrombus. Normal compressibility, respiratory phasicity and response to augmentation. Calf Veins: No evidence of thrombus. Normal compressibility and flow on color Doppler imaging. Superficial Great Saphenous Vein: No evidence of thrombus. Normal compressibility. Venous Reflux:  None. Other Findings: No evidence of superficial thrombophlebitis or abnormal fluid collection. IMPRESSION: No evidence of left lower extremity deep venous thrombosis. Electronically Signed   By: Aletta Edouard M.D.   On: 04/23/2021 10:43    Scheduled Meds:  acetaminophen  500 mg Oral Q6H   docusate sodium  100 mg Oral BID   heparin injection (subcutaneous)  5,000 Units Subcutaneous Q8H   insulin aspart  0-5 Units Subcutaneous QHS   insulin aspart  0-6 Units Subcutaneous TID WC   insulin aspart  2 Units Subcutaneous TID WC   tamsulosin  0.4 mg Oral QPC breakfast   Continuous Infusions:  sodium chloride     sodium chloride 100 mL/hr at 04/24/21 1131   vancomycin 1,250 mg (04/24/21 0522)    LOS: 3 days   Time spent: 35 mins  Clanford Wynetta Emery, MD How to contact the [facility] Attending or Consulting provider [place] or covering provider during after hours [place], for this patient?  Check the  care team in [facility] and look for a) attending/consulting [facility] provider listed and b) the [facility] team listed Log into [email or web address] and use [place]'s universal password to access. If you do not have the password, please contact the hospital operator. Locate the [facility] provider you are looking for under [facility] and page to a number that you can be directly reached. If you still have difficulty reaching the provider, please page the [facility] (Director on Call) for the Hospitalists listed on amion for assistance.  04/24/2021, 12:55 PM

## 2021-04-25 ENCOUNTER — Telehealth: Payer: Self-pay | Admitting: Orthopedic Surgery

## 2021-04-25 DIAGNOSIS — S86112D Strain of other muscle(s) and tendon(s) of posterior muscle group at lower leg level, left leg, subsequent encounter: Secondary | ICD-10-CM

## 2021-04-25 LAB — CBC
HCT: 36.8 % — ABNORMAL LOW (ref 39.0–52.0)
Hemoglobin: 11 g/dL — ABNORMAL LOW (ref 13.0–17.0)
MCH: 26.3 pg (ref 26.0–34.0)
MCHC: 29.9 g/dL — ABNORMAL LOW (ref 30.0–36.0)
MCV: 88 fL (ref 80.0–100.0)
Platelets: 234 10*3/uL (ref 150–400)
RBC: 4.18 MIL/uL — ABNORMAL LOW (ref 4.22–5.81)
RDW: 18.7 % — ABNORMAL HIGH (ref 11.5–15.5)
WBC: 5.5 10*3/uL (ref 4.0–10.5)
nRBC: 0 % (ref 0.0–0.2)

## 2021-04-25 LAB — BASIC METABOLIC PANEL
Anion gap: 6 (ref 5–15)
BUN: 18 mg/dL (ref 8–23)
CO2: 22 mmol/L (ref 22–32)
Calcium: 8.1 mg/dL — ABNORMAL LOW (ref 8.9–10.3)
Chloride: 106 mmol/L (ref 98–111)
Creatinine, Ser: 0.97 mg/dL (ref 0.61–1.24)
GFR, Estimated: >60 mL/min (ref >60)
Glucose, Bld: 123 mg/dL — ABNORMAL HIGH (ref 70–99)
Potassium: 4.3 mmol/L (ref 3.5–5.1)
Sodium: 134 mmol/L — ABNORMAL LOW (ref 135–145)

## 2021-04-25 LAB — GLUCOSE, CAPILLARY
Glucose-Capillary: 116 mg/dL — ABNORMAL HIGH (ref 70–99)
Glucose-Capillary: 105 mg/dL — ABNORMAL HIGH (ref 70–99)
Glucose-Capillary: 134 mg/dL — ABNORMAL HIGH (ref 70–99)
Glucose-Capillary: 101 mg/dL — ABNORMAL HIGH (ref 70–99)

## 2021-04-25 LAB — VANCOMYCIN, TROUGH: Vancomycin Tr: 19 ug/mL (ref 15–20)

## 2021-04-25 NOTE — Telephone Encounter (Signed)
Call received (voice message) from patients daughter/designated contact, [name], ph# [phone], asking about what time patient may be discharged today - states that is what patient had told her. Also asking about what patient may be able to do, and not do, once discharged. Please advise.

## 2021-04-25 NOTE — Evaluation (Signed)
Physical Therapy Evaluation Patient Details Name: Angel Norman MRN: [id number] DOB: Aug 06, 1932 Today's Date: 04/25/2021  History of Present Illness  Angel Norman  is a 88 y.o. male s/p Repair posterior tibial tendon with  Tenosynovectomy left ankle, and left ankle joint aspiration (Left)  on 04/24/21 with past medical history relevant for obesity, GERD, chronic anemia, HLD, who previously underwent  ORIF of his ankle September 13, 2020  -Postop developed osteomyelitis was treated for 6 to 8 weeks with IV antibiotics at that time   Clinical Impression  Patient demonstrates good return for bed mobility, transferring to/from chair and commode in bathroom and taking steps in room without loss of balance.  Patient limited for gait training mostly due to increasing left ankle/foot pain and tolerated sitting up in chair after therapy - nursing staff aware.   Patient will benefit from continued skilled physical therapy in hospital and recommended venue below to increase strength, balance, endurance for safe ADLs and gait.      Recommendations for follow up therapy are one component of a multi-disciplinary discharge planning process, led by the attending physician.  Recommendations may be updated based on patient status, additional functional criteria and insurance authorization.  Follow Up Recommendations Home health PT    Assistance Recommended at Discharge Set up Supervision/Assistance  Patient can return home with the following  A little help with walking and/or transfers;A little help with bathing/dressing/bathroom;Help with stairs or ramp for entrance;Assistance with cooking/housework    Equipment Recommendations None recommended by PT  Recommendations for Other Services       Functional Status Assessment Patient has had a recent decline in their functional status and demonstrates the ability to make significant improvements in function in a reasonable and predictable amount of time.      Precautions / Restrictions Precautions Precautions: Fall Restrictions Weight Bearing Restrictions: Yes LLE Weight Bearing: Weight bearing as tolerated      Mobility  Bed Mobility Overal bed mobility: Modified Independent                  Transfers Overall transfer level: Modified independent                 General transfer comment: good return for transferring to/from chair and commode in bathroom    Ambulation/Gait Ambulation/Gait assistance: Supervision Gait Distance (Feet): 25 Feet Assistive device: Rolling walker (2 wheels) Gait Pattern/deviations: Decreased step length - right, Decreased step length - left, Decreased stride length, Trunk flexed Gait velocity: decreased     General Gait Details: slow labored cadence without loss of balance, limited mostly due to increasing pain left ankle/foot  Stairs            Wheelchair Mobility    Modified Rankin (Stroke Patients Only)       Balance Overall balance assessment: Needs assistance Sitting-balance support: Feet supported, No upper extremity supported Sitting balance-Leahy Scale: Good Sitting balance - Comments: seated at EOB   Standing balance support: During functional activity, Bilateral upper extremity supported Standing balance-Leahy Scale: Fair Standing balance comment: fair/good using RW                             Pertinent Vitals/Pain Pain Assessment Pain Assessment: Faces Faces Pain Scale: Hurts little more Pain Location: left ankle/foot Pain Descriptors / Indicators: Sore Pain Intervention(s): Limited activity within patient's tolerance, Monitored during session, Repositioned    Home Living Family/patient expects to be discharged to:: Private  residence Living Arrangements: Alone Available Help at Discharge: Available PRN/intermittently;Family Type of Home: House Home Access: Stairs to enter;Ramped entrance Entrance Stairs-Rails: Right;Left Entrance  Stairs-Number of Steps: 3 Alternate Level Stairs-Number of Steps: 10+ Home Layout: Multi-level;Able to live on main level with bedroom/bathroom Home Equipment: Rolling Walker (2 wheels);Cane - single point;Shower seat;BSC/3in1;Grab bars - tub/shower;Grab bars - toilet;Wheelchair - manual      Prior Function Prior Level of Function : Needs assist       Physical Assist : Mobility (physical);ADLs (physical) Mobility (physical): Bed mobility;Transfers;Gait;Stairs   Mobility Comments: household and short distanced community ambulator with RW, uses wheelchair for longer distances ADLs Comments: assisted by family     Hand Dominance   Dominant Hand: Left    Extremity/Trunk Assessment   Upper Extremity Assessment Upper Extremity Assessment: Overall WFL for tasks assessed    Lower Extremity Assessment Lower Extremity Assessment: Generalized weakness;LLE deficits/detail LLE Deficits / Details: grossly 4+/5 except ankle not tested due to splinted LLE: Unable to fully assess due to immobilization LLE Sensation: WNL LLE Coordination: WNL    Cervical / Trunk Assessment Cervical / Trunk Assessment: Kyphotic  Communication   Communication: No difficulties  Cognition Arousal/Alertness: Awake/alert Behavior During Therapy: WFL for tasks assessed/performed Overall Cognitive Status: Within Functional Limits for tasks assessed                                          General Comments      Exercises     Assessment/Plan    PT Assessment Patient needs continued PT services  PT Problem List Decreased strength;Decreased activity tolerance;Decreased balance;Decreased mobility       PT Treatment Interventions DME instruction;Gait training;Stair training;Functional mobility training;Therapeutic activities;Therapeutic exercise;Patient/family education;Balance training    PT Goals (Current goals can be found in the Care Plan section)  Acute Rehab PT Goals Patient  Stated Goal: return home with family to assist PT Goal Formulation: With patient Time For Goal Achievement: 04/28/21 Potential to Achieve Goals: Good    Frequency Min 3X/week     Co-evaluation               AM-PAC PT "6 Clicks" Mobility  Outcome Measure Help needed turning from your back to your side while in a flat bed without using bedrails?: None Help needed moving from lying on your back to sitting on the side of a flat bed without using bedrails?: None Help needed moving to and from a bed to a chair (including a wheelchair)?: None Help needed standing up from a chair using your arms (e.g., wheelchair or bedside chair)?: None Help needed to walk in hospital room?: A Little Help needed climbing 3-5 steps with a railing? : A Lot 6 Click Score: 21    End of Session   Activity Tolerance: Patient tolerated treatment well;Patient limited by fatigue Patient left: in chair;with call bell/phone within reach Nurse Communication: Mobility status PT Visit Diagnosis: Unsteadiness on feet (R26.81);Other abnormalities of gait and mobility (R26.89);Muscle weakness (generalized) (M62.81)    Time: 4132-4401 PT Time Calculation (min) (ACUTE ONLY): 20 min   Charges:   PT Evaluation $PT Eval Moderate Complexity: 1 Mod PT Treatments $Therapeutic Activity: 8-22 mins        1:53 PM, 04/25/21 Ocie Bob, MPT Physical Therapist with [facility] [phone] office [phone] mobile phone

## 2021-04-25 NOTE — Progress Notes (Signed)
Patient ID: Angel Norman, male   DOB: Oct 03, 1932, 88 y.o.   MRN: [id number]  Postop day 1 status post Tina synovectomy of the left ankle repair of the posterior tibial tendon  Initial cultures are negative  Patient's vital signs are stable  BP 115/64 (BP Location: Right Arm)    Pulse 78    Temp 97.8 F (36.6 C)    Resp 12    Ht 6\' 3"  (1.905 m)    Wt 136 kg    SpO2 91%    BMI 37.48 kg/m   Burning pain along the medial side of the ankle seems to be resolving  I do not think the patient will need a PICC line at this point unless cultures come back positive expect discharge tomorrow

## 2021-04-25 NOTE — Plan of Care (Signed)
°  Problem: Acute Rehab PT Goals(only PT should resolve) Goal: Pt Will Go Supine/Side To Sit Outcome: Progressing Flowsheets (Taken 04/25/2021 1354) Pt will go Supine/Side to Sit:  Independently  with modified independence Goal: Patient Will Transfer Sit To/From Stand Outcome: Progressing Flowsheets (Taken 04/25/2021 1354) Patient will transfer sit to/from stand:  Independently  with modified independence Goal: Pt Will Transfer Bed To Chair/Chair To Bed Outcome: Progressing Flowsheets (Taken 04/25/2021 1354) Pt will Transfer Bed to Chair/Chair to Bed:  Independently  with modified independence Goal: Pt Will Ambulate Outcome: Progressing Flowsheets (Taken 04/25/2021 1354) Pt will Ambulate:  75 feet  with supervision  with modified independence  with rolling walker   1:54 PM, 04/25/21 Lonell Grandchild, MPT Physical Therapist with [facility] 336 [phone] office [phone] mobile phone

## 2021-04-25 NOTE — Telephone Encounter (Signed)
I called her to discuss Postoperative plan is for the patient to be weightbearing as tolerated Convert to cam walking boot when the swelling goes down. Vancomycin will be continued until initial culture results come back Once the culture results are known determine if further antibiotic treatment is needed for if treatment can be tailored to the posterior tibial tendon dysfunction

## 2021-04-25 NOTE — Progress Notes (Signed)
PROGRESS NOTE    Angel Norman [facility]  RSW:[id number] DOB: Dec 18, 1932 DOA: 04/21/2021 PCP: Mayra Neer, MD   Brief Narrative:   88 y.o. male with past medical history relevant for obesity, GERD, chronic anemia, HLD, who previously underwent  ORIF of his ankle September 13, 2020 -Postop developed osteomyelitis was treated for 6 to 8 weeks with IV antibiotics at that time   Additional history obtained from patient's daughter at bedside -No fever  Or chills  No Nausea, Vomiting or Diarrhea -- Complains of increasing discomfort/pain especially with weightbearing over the left ankle area, concerns of back left ankle area paresthesia as well- -No recent traumas or falls -No chest pains no palpitations no dizziness no shortness of breath -No leg pain otherwise except for left ankle pain diarrhea and no pleuritic type symptoms   ESR 42, up from 38 on 04/19/2021 -CRP was 28.8 on 04/19/2021, repeat CRP pending -WBC 6.2 Creatinine is 1.1 -Left ankle x-rays from 04/19/2021 noted-- intact hardware status post ORIF left ankle,  Bone changes from prior osteomyelitis -MRI of the left ankle with and without contrast to be done on Monday, 04/23/2021 - EDP discussed case with patient's orthopedic surgeon Dr. Aline Brochure who recommends admission to the hospital for IV vancomycin with possible operative intervention pending MRI findings from 04/23/2021   04/22/2021:  Pt agreeable to getting MRI with and without contrast on 2/20 and understands we don't have MRI on weekends at this hospital.  Pt also agreeable to PICC line if recommended by ortho.    04/23/2021:  pt had MRI of left ankle done, venous doppler of left leg neg for DVT, continues to complain of pain and discomfort left ankle, not ambulating.    04/24/2021: Pt went to OR with Dr. Aline Brochure: partial rupture posterior tibial tendon s/p repair, tenoynovectomy left ankle, and left ankle joint aspiration.    04/25/2021: Initial culture results negative and patient  working with PT.  Awaiting final culture results prior to discharge.  Assessment & Plan:   Principal Problem:   Tibialis posterior tendon partial rupture Active Problems:   GERD (gastroesophageal reflux disease)   CKD (chronic kidney disease) stage 3, GFR 30-59 ml/min (HCC)   Obesity (BMI 30-39.9)   Mixed hyperlipidemia   Osteoarthritis   Hearing loss   Type 2 diabetes mellitus with neuropathy   Working Diagnosis of Acute on chronic osteomyelitis of left ankle - being ruled out   Anemia, chronic disease   Tenosynovitis of tibialis posterior tendon   Tibialis posterior tendon partial rupture- (present on admission) Pt went to OR with Dr. Aline Brochure on 2/21 S/p repair in OR 04/24/21 Follow up culture to determine if antibiotics needed Postop plan is:  weightbearing as tolerated Convert to cam walking boot when edema improved Continue vancomycin until final culture results available   Tenosynovitis of tibialis posterior tendon- (present on admission) S/p tenosynovectomy by Dr Aline Brochure on 2/21   Working Diagnosis of Acute on chronic osteomyelitis of left ankle - being ruled out- (present on admission) Appreciate Dr. Aline Brochure consult and recommendations Plan of care  MRI with and without gadolinium on 2/20 was reassuring that infection may be resolved IV vancomycin dosed per pharmacy to begin immediately If infection is present possible PICC line placement for home IV antibiotics To OR with Dr. Aline Brochure on 2/21 for cultures; follow up culture results to determine if further abx needed.    Type 2 diabetes mellitus with neuropathy- (present on admission) Holding home metformin and Jardiance in hospital only Follow  frequent CBG testing and supplemental SSI coverage.  Further recommendations pending more CBG data.  Carb modified Diet     Osteoarthritis- (present on admission) Acetaminophen ordered for pain and inflammation    Mixed hyperlipidemia- (present on  admission) Pravastatin 80 mg daily resumed    Obesity (BMI 30-39.9)- (present on admission) Mobility is difficult due to left ankle pain, weight bearing as tolerated PT to see 04/25/21   Anemia, chronic disease- (present on admission) Hg stable at 11.   Follow CBC in AM.    Hearing loss Have patient repeat back what he has heard to be sure he understood the information.   Speak to patient with loud voice.  May need to remove mask so he can follow/read lips.    CKD (chronic kidney disease) stage 3, GFR 30-59 ml/min (HCC)- (present on admission) His GFR has improved.   Continue monitoring.     GERD (gastroesophageal reflux disease)- (present on admission) Protonix daily for GI protection    DVT prophylaxis: SQ heparin Code Status: Full  Family Communication: bedside update given 2/21 Disposition: Status is: Inpatient Remains inpatient appropriate because: IV antibiotics    Consultants:  Orthopedics Dr. Aline Brochure   Procedures:  Tentative OR with Dr Aline Brochure 2/21  Antimicrobials:  Vancomycin IV 2/18>>    Subjective: Patient seen and evaluated today with no new acute complaints or concerns. No acute concerns or events noted overnight.  Objective: Vitals:   04/24/21 1700 04/24/21 2132 04/25/21 0130 04/25/21 0543  BP: 122/69 117/60 115/64 115/64  Pulse: 68 79 87 78  Resp: $Remo'18 16 17 12  'UJPUY$ Temp: 98.3 F (36.8 C) 98 F (36.7 C) 98.1 F (36.7 C) 97.8 F (36.6 C)  TempSrc: Oral     SpO2: 97% 93% 91% 91%  Weight:      Height:        Intake/Output Summary (Last 24 hours) at 04/25/2021 1233 Last data filed at 04/25/2021 0935 Gross per 24 hour  Intake 1751.4 ml  Output 2500 ml  Net -748.6 ml   Filed Weights   04/21/21 1524 04/24/21 0624  Weight: 136.1 kg 136 kg    Examination:  General exam: Appears calm and comfortable  Respiratory system: Clear to auscultation. Respiratory effort normal. Cardiovascular system: S1 & S2 heard, RRR.  Gastrointestinal system: Abdomen  is soft Central nervous system: Alert and awake Extremities: No edema, left ankle dressings clean dry and intact Skin: No significant lesions noted Psychiatry: Flat affect.    Data Reviewed: I have personally reviewed following labs and imaging studies  CBC: Recent Labs  Lab 04/19/21 1111 04/21/21 1653 04/22/21 0418 04/23/21 0426 04/24/21 0414 04/25/21 0429  WBC 6.0 6.2 5.2 5.0 5.0 5.5  NEUTROABS 4,260 4.8  --   --   --   --   HGB 12.2* 12.0* 11.2* 11.0* 11.3* 11.0*  HCT 39.4 41.3 37.2* 38.3* 39.4 36.8*  MCV 83.5 87.1 87.3 86.8 87.6 88.0  PLT 235 204 222 205 233 536   Basic Metabolic Panel: Recent Labs  Lab 04/19/21 1111 04/21/21 1653 04/22/21 0418 04/23/21 0426 04/25/21 0429  NA 139 137 134* 135 134*  K 4.5 4.3 3.8 3.8 4.3  CL 104 104 105 104 106  CO2 $Re'25 26 22 24 22  'tKY$ GLUCOSE 86 109* 109* 109* 123*  BUN $Re'15 18 20 18 18  'dTr$ CREATININE 0.94 1.14 0.95 0.93 0.97  CALCIUM 9.3 8.6* 8.2* 8.3* 8.1*   GFR: Estimated Creatinine Clearance: 81.3 mL/min (by C-G formula based on SCr of  0.97 mg/dL). Liver Function Tests: Recent Labs  Lab 04/19/21 1111  AST 12  ALT 12  BILITOT 0.7  PROT 7.4   No results for input(s): LIPASE, AMYLASE in the last 168 hours. No results for input(s): AMMONIA in the last 168 hours. Coagulation Profile: No results for input(s): INR, PROTIME in the last 168 hours. Cardiac Enzymes: No results for input(s): CKTOTAL, CKMB, CKMBINDEX, TROPONINI in the last 168 hours. BNP (last 3 results) No results for input(s): PROBNP in the last 8760 hours. HbA1C: No results for input(s): HGBA1C in the last 72 hours. CBG: Recent Labs  Lab 04/24/21 1129 04/24/21 1619 04/24/21 2136 04/25/21 0753 04/25/21 1114  GLUCAP 96 152* 142* 116* 105*   Lipid Profile: No results for input(s): CHOL, HDL, LDLCALC, TRIG, CHOLHDL, LDLDIRECT in the last 72 hours. Thyroid Function Tests: No results for input(s): TSH, T4TOTAL, FREET4, T3FREE, THYROIDAB in the last 72  hours. Anemia Panel: No results for input(s): VITAMINB12, FOLATE, FERRITIN, TIBC, IRON, RETICCTPCT in the last 72 hours. Sepsis Labs: No results for input(s): PROCALCITON, LATICACIDVEN in the last 168 hours.  Recent Results (from the past 240 hour(s))  Resp Panel by RT-PCR (Flu A&B, Covid) Nasopharyngeal Swab     Status: None   Collection Time: 04/21/21  4:40 PM   Specimen: Nasopharyngeal Swab; Nasopharyngeal(NP) swabs in vial transport medium  Result Value Ref Range Status   SARS Coronavirus 2 by RT PCR NEGATIVE NEGATIVE Final    Comment: (NOTE) SARS-CoV-2 target nucleic acids are NOT DETECTED.  The SARS-CoV-2 RNA is generally detectable in upper respiratory specimens during the acute phase of infection. The lowest concentration of SARS-CoV-2 viral copies this assay can detect is 138 copies/mL. A negative result does not preclude SARS-Cov-2 infection and should not be used as the sole basis for treatment or other patient management decisions. A negative result may occur with  improper specimen collection/handling, submission of specimen other than nasopharyngeal swab, presence of viral mutation(s) within the areas targeted by this assay, and inadequate number of viral copies(<138 copies/mL). A negative result must be combined with clinical observations, patient history, and epidemiological information. The expected result is Negative.  Fact Sheet for Patients:  EntrepreneurPulse.com.au  Fact Sheet for Healthcare Providers:  IncredibleEmployment.be  This test is no t yet approved or cleared by the [place] FDA and  has been authorized for detection and/or diagnosis of SARS-CoV-2 by FDA under an Emergency Use Authorization (EUA). This EUA will remain  in effect (meaning this test can be used) for the duration of the COVID-19 declaration under Section 564(b)(1) of the Act, 21 U.S.C.section 360bbb-3(b)(1), unless the authorization is  terminated  or revoked sooner.       Influenza A by PCR NEGATIVE NEGATIVE Final   Influenza B by PCR NEGATIVE NEGATIVE Final    Comment: (NOTE) The Xpert Xpress SARS-CoV-2/FLU/RSV plus assay is intended as an aid in the diagnosis of influenza from Nasopharyngeal swab specimens and should not be used as a sole basis for treatment. Nasal washings and aspirates are unacceptable for Xpert Xpress SARS-CoV-2/FLU/RSV testing.  Fact Sheet for Patients: EntrepreneurPulse.com.au  Fact Sheet for Healthcare Providers: IncredibleEmployment.be  This test is not yet approved or cleared by the [place] FDA and has been authorized for detection and/or diagnosis of SARS-CoV-2 by FDA under an Emergency Use Authorization (EUA). This EUA will remain in effect (meaning this test can be used) for the duration of the COVID-19 declaration under Section 564(b)(1) of the Act, 21 U.S.C. section 360bbb-3(b)(1),  unless the authorization is terminated or revoked.  Performed at [facility], [address] [place], [place] [address]   Surgical pcr screen     Status: None   Collection Time: 04/23/21  1:21 AM   Specimen: Nasal Mucosa; Nasal Swab  Result Value Ref Range Status   MRSA, PCR NEGATIVE NEGATIVE Final   Staphylococcus aureus NEGATIVE NEGATIVE Final    Comment: (NOTE) The Xpert SA Assay (FDA approved for NASAL specimens in patients 46 years of age and older), is one component of a comprehensive surveillance program. It is not intended to diagnose infection nor to guide or monitor treatment. Performed at [facility], [address] [place], [place] [address]   Aerobic/Anaerobic Culture w Gram Stain (surgical/deep wound)     Status: None (Preliminary result)   Collection Time: 04/24/21  8:22 AM   Specimen: Wound; Tissue  Result Value Ref Range Status   Specimen Description JOINT FLUID  Final   Special Requests LEFT ANKLE SWAB A  Final   Gram Stain  PENDING  Incomplete   Culture   Final    NO GROWTH < 24 HOURS Performed at [facility], [place] [address] [place], [place] [address]    Report Status PENDING  Incomplete  Aerobic/Anaerobic Culture w Gram Stain (surgical/deep wound)     Status: None (Preliminary result)   Collection Time: 04/24/21  8:39 AM  Result Value Ref Range Status   Specimen Description JOINT FLUID  Final   Special Requests LEFT ANKLE SWAB,SAMPLE B  Final   Gram Stain NO WBC SEEN NO ORGANISMS SEEN   Final   Culture   Final    NO GROWTH < 12 HOURS Performed at [facility], [place] [address], [place], [place] [address]    Report Status PENDING  Incomplete         Radiology Studies: No results found.      Scheduled Meds:  docusate sodium  100 mg Oral BID   gabapentin  100 mg Oral TID   heparin injection (subcutaneous)  5,000 Units Subcutaneous Q8H   insulin aspart  0-5 Units Subcutaneous QHS   insulin aspart  0-6 Units Subcutaneous TID WC   insulin aspart  2 Units Subcutaneous TID WC   tamsulosin  0.4 mg Oral QPC breakfast   Continuous Infusions:  sodium chloride     sodium chloride 100 mL/hr at 04/25/21 0201   vancomycin 1,250 mg (04/25/21 0535)     LOS: 4 days    Time spent: 35 minutes    Philip Eckersley Darleen Crocker, DO [facility]  If 7PM-7AM, please contact night-coverage [email or web address] 04/25/2021, 12:33 PM

## 2021-04-26 ENCOUNTER — Ambulatory Visit (HOSPITAL_COMMUNITY): Payer: Medicare PPO

## 2021-04-26 ENCOUNTER — Encounter (HOSPITAL_COMMUNITY): Payer: Self-pay | Admitting: Orthopedic Surgery

## 2021-04-26 ENCOUNTER — Telehealth: Payer: Self-pay | Admitting: Radiology

## 2021-04-26 DIAGNOSIS — S86112D Strain of other muscle(s) and tendon(s) of posterior muscle group at lower leg level, left leg, subsequent encounter: Secondary | ICD-10-CM | POA: Diagnosis not present

## 2021-04-26 LAB — BASIC METABOLIC PANEL
Anion gap: 6 (ref 5–15)
BUN: 18 mg/dL (ref 8–23)
CO2: 22 mmol/L (ref 22–32)
Calcium: 8.2 mg/dL — ABNORMAL LOW (ref 8.9–10.3)
Chloride: 106 mmol/L (ref 98–111)
Creatinine, Ser: 0.88 mg/dL (ref 0.61–1.24)
GFR, Estimated: >60 mL/min (ref >60)
Glucose, Bld: 107 mg/dL — ABNORMAL HIGH (ref 70–99)
Potassium: 4.1 mmol/L (ref 3.5–5.1)
Sodium: 134 mmol/L — ABNORMAL LOW (ref 135–145)

## 2021-04-26 LAB — GLUCOSE, CAPILLARY: Glucose-Capillary: 108 mg/dL — ABNORMAL HIGH (ref 70–99)

## 2021-04-26 MED ORDER — DOXYCYCLINE HYCLATE 100 MG PO TABS: 100.0000 mg | ORAL_TABLET | Freq: Two times a day (BID) | ORAL | 0 refills | Status: AC

## 2021-04-26 MED ORDER — POLYETHYLENE GLYCOL 3350 17 G PO PACK: 17.0000 g | PACK | Freq: Every day | ORAL | 0 refills | Status: AC | PRN

## 2021-04-26 MED ORDER — GABAPENTIN 100 MG PO CAPS: 100.0000 mg | ORAL_CAPSULE | Freq: Three times a day (TID) | ORAL | 0 refills | Status: DC

## 2021-04-26 MED ORDER — HYDROCODONE-ACETAMINOPHEN 7.5-325 MG PO TABS: 1.0000 | ORAL_TABLET | ORAL | 0 refills | Status: DC | PRN

## 2021-04-26 MED ORDER — DOCUSATE SODIUM 100 MG PO CAPS: 100.0000 mg | ORAL_CAPSULE | Freq: Two times a day (BID) | ORAL | 0 refills | Status: DC

## 2021-04-26 NOTE — Telephone Encounter (Signed)
-----   Message from Doristine Section sent at 04/26/2021  8:35 AM EST ----- Regarding: Tues 3/7 (what time for Dr Rexene Edison) Angel Norman, Angel Norman, Angel Norman [[id number]] is to be seen (rather than 2/27) - on Tues, 05/08/21 for post op,etc. Can you please ask Dr Romeo Apple what time he'd like to see him, and we'll create the slot. Thanks :)

## 2021-04-26 NOTE — Progress Notes (Signed)
Patient ID: Angel Norman, male   DOB: 02-26-1933, 88 y.o.   MRN: [id number]  Postop day 2 status postteno synovectomy left ankle repair posterior tibial tendon  BP 126/64 (BP Location: Left Arm)    Pulse 80    Temp 97.9 F (36.6 C)    Resp 18    Ht 6\' 3"  (1.905 m)    Wt 136 kg    SpO2 93%    BMI 37.48 kg/m   Patient is afebrile burning sensation medial side of the ankle has resolved  Patient is amenable to discharge on doxycycline 100 mg twice a day with 2-week follow-up  He is weightbearing as tolerated  CBC    Component Value Date/Time   WBC 5.5 04/25/2021 0429   RBC 4.18 (L) 04/25/2021 0429   HGB 11.0 (L) 04/25/2021 0429   HCT 36.8 (L) 04/25/2021 0429   PLT 234 04/25/2021 0429   MCV 88.0 04/25/2021 0429   MCH 26.3 04/25/2021 0429   MCHC 29.9 (L) 04/25/2021 0429   RDW 18.7 (H) 04/25/2021 0429   LYMPHSABS 0.7 04/21/2021 1653   MONOABS 0.5 04/21/2021 1653   EOSABS 0.1 04/21/2021 1653   BASOSABS 0.0 04/21/2021 1653   Erythrocyte Sedimentation Rate     Component Value Date/Time   ESRSEDRATE 42 (H) 04/21/2021 1653     C-Reactive Protein     Component Value Date/Time   CRP 6.9 (H) 04/21/2021 1653   Culture results no growth seen at 2 days  Postoperative plan is for the patient to be weightbearing as tolerated Convert to cam walking boot when the swelling goes down. Vancomycin will be continued until initial culture results come back Once the culture results are known determine if further antibiotic treatment is needed for if treatment can be tailored to the posterior tibial tendon dysfunction

## 2021-04-26 NOTE — Care Management Important Message (Signed)
Important Message  Patient Details  Name: Angel Norman MRN: [id number] Date of Birth: 06-09-1932   Medicare Important Message Given:  Yes (late entry, letter given prior to discharge)     Corey Harold 04/26/2021, 11:52 AM

## 2021-04-26 NOTE — Discharge Summary (Signed)
Physician Discharge Summary   Patient: Angel Norman MRN: [id number] DOB: [birth year]  Admit date:     04/21/2021  Discharge date: 04/26/21  Discharge Physician: Rodena Goldmann   PCP: Mayra Neer, MD   Recommendations at discharge:   Follow-up with PCP in 1 week and orthopedics Dr. Aline Brochure in 2 weeks as recommended Continue doxycycline twice daily as prescribed for 14 days Continue pain medications as ordered Continue other home medications as prior  Discharge Diagnoses: Principal Problem:   Tibialis posterior tendon partial rupture Active Problems:   GERD (gastroesophageal reflux disease)   CKD (chronic kidney disease) stage 3, GFR 30-59 ml/min (HCC)   Obesity (BMI 30-39.9)   Mixed hyperlipidemia   Osteoarthritis   Hearing loss   Type 2 diabetes mellitus with neuropathy   Working Diagnosis of Acute on chronic osteomyelitis of left ankle - being ruled out   Anemia, chronic disease   Tenosynovitis of tibialis posterior tendon  Resolved Problems:   * No resolved hospital problems. *   Hospital Course: 88 y.o. male with past medical history relevant for obesity, GERD, chronic anemia, HLD, who previously underwent  ORIF of his ankle September 13, 2020 -Postop developed osteomyelitis was treated for 6 to 8 weeks with IV antibiotics at that time  Additional history obtained from patient's daughter at bedside -No fever  Or chills  No Nausea, Vomiting or Diarrhea -- Complains of increasing discomfort/pain especially with weightbearing over the left ankle area, concerns of back left ankle area paresthesia as well- -No recent traumas or falls -No chest pains no palpitations no dizziness no shortness of breath -No leg pain otherwise except for left ankle pain diarrhea and no pleuritic type symptoms  ESR 42, up from 38 on 04/19/2021 -CRP was 28.8 on 04/19/2021, repeat CRP pending -WBC 6.2 Creatinine is 1.1 -Left ankle x-rays from 04/19/2021 noted-- intact hardware status post ORIF left  ankle,  Bone changes from prior osteomyelitis -MRI of the left ankle with and without contrast to be done on Monday, 04/23/2021 - EDP discussed case with patient's orthopedic surgeon Dr. Aline Brochure who recommends admission to the hospital for IV vancomycin with possible operative intervention pending MRI findings from 04/23/2021  04/22/2021:  Pt agreeable to getting MRI with and without contrast on 2/20 and understands we don't have MRI on weekends at this hospital.  Pt also agreeable to PICC line if recommended by ortho.   04/23/2021:  pt had MRI of left ankle done, venous doppler of left leg neg for DVT, continues to complain of pain and discomfort left ankle, not ambulating.   04/24/2021: Pt went to OR with Dr. Aline Brochure: partial rupture posterior tibial tendon s/p repair, tenoynovectomy left ankle, and left ankle joint aspiration.    04/25/2021: Initial culture results negative and patient working with PT.  Awaiting final culture results prior to discharge.  04/26/2021: Culture results have returned negative and PT has recommended home health physiotherapy.  No other acute events noted throughout the course of the stay.  Seen by orthopedics this a.m. with recommendations to remain on doxycycline 100 mg twice daily for 2 weeks and then follow-up outpatient.  Assessment and Plan: * Tibialis posterior tendon partial rupture- (present on admission) Pt went to OR with Dr. Aline Brochure on 2/21 S/p repair in OR 04/24/21 Follow up culture to determine if antibiotics needed Postop plan is:  weightbearing as tolerated Convert to cam walking boot when edema improved Continue now on doxycycline as prescribed for 2 weeks with 2-week follow-up  Tenosynovitis of tibialis posterior tendon- (present on admission) S/p tenosynovectomy by Dr Aline Brochure on 2/21  Anemia, chronic disease- (present on admission) Hg stable at 17.     Working Diagnosis of Acute on chronic osteomyelitis of left ankle - being ruled out-  (present on admission) Ruled out  Type 2 diabetes mellitus with neuropathy- (present on admission) Resume home metformin and Jardiance  Hearing loss Have patient repeat back what he has heard to be sure he understood the information.   Speak to patient with loud voice.  May need to remove mask so he can follow/read lips.   Osteoarthritis- (present on admission) Acetaminophen ordered for pain and inflammation   Mixed hyperlipidemia- (present on admission) Pravastatin 80 mg daily resumed   Obesity (BMI 30-39.9)- (present on admission) Mobility is difficult due to left ankle pain, weight bearing as tolerated PT to see 04/25/21  CKD (chronic kidney disease) stage 3, GFR 30-59 ml/min (HCC)- (present on admission) His GFR has improved.   Continue monitoring.    GERD (gastroesophageal reflux disease)- (present on admission) Protonix daily for GI protection            Consultants: Orthopedics Procedures performed: Tenosynovectomy 2/21 Disposition: Home Diet recommendation:  Discharge Diet Orders (From admission, onward)     Start     Ordered   04/26/21 0000  Diet - low sodium heart healthy        04/26/21 0959           Cardiac and Carb modified diet  DISCHARGE MEDICATION: Allergies as of 04/26/2021   No Known Allergies      Medication List     TAKE these medications    acetaminophen 325 MG tablet Commonly known as: TYLENOL Take 2 tablets (650 mg total) by mouth every 6 (six) hours.   docusate sodium 100 MG capsule Commonly known as: COLACE Take 1 capsule (100 mg total) by mouth 2 (two) times daily.   doxycycline 100 MG tablet Commonly known as: VIBRA-TABS Take 1 tablet (100 mg total) by mouth 2 (two) times daily for 14 days.   gabapentin 100 MG capsule Commonly known as: NEURONTIN Take 1 capsule (100 mg total) by mouth 3 (three) times daily.   HYDROcodone-acetaminophen 7.5-325 MG tablet Commonly known as: NORCO Take 1-2 tablets by mouth every 4  (four) hours as needed for severe pain (pain score 7-10).   Jardiance 25 MG Tabs tablet Generic drug: empagliflozin Take 25 mg by mouth every morning.   metFORMIN 1000 MG tablet Commonly known as: GLUCOPHAGE Take 1,000 mg by mouth 2 (two) times daily.   polyethylene glycol 17 g packet Commonly known as: MIRALAX / GLYCOLAX Take 17 g by mouth daily as needed for mild constipation.   pravastatin 80 MG tablet Commonly known as: PRAVACHOL Take 80 mg by mouth daily.   tamsulosin 0.4 MG Caps capsule Commonly known as: FLOMAX Take 0.4 mg by mouth daily.   traMADol 50 MG tablet Commonly known as: ULTRAM Take 1 tablet (50 mg total) by mouth every 6 (six) hours as needed.               Discharge Care Instructions  (From admission, onward)           Start     Ordered   04/26/21 0000  Leave dressing on - Keep it clean, dry, and intact until clinic visit        04/26/21 0959            Follow-up  Information     Care, [facility] Follow up.   Specialty: Home Health Services Why: Will contact you to schedule home health visits. Contact information: [place] [address] [phone]         Mayra Neer, MD. Schedule an appointment as soon as possible for a visit in 1 week(s).   Specialty: Family Medicine Contact information: [address] [place] [address] [phone]         Carole Civil, MD. Schedule an appointment as soon as possible for a visit in 2 week(s).   Specialties: Orthopedic Surgery, Radiology Contact information: [address] [place] [address] [phone]                 Discharge Exam: Danley Danker Weights   04/21/21 1524 04/24/21 0624  Weight: 136.1 kg 136 kg     Condition at discharge: stable  The results of significant diagnostics from this hospitalization (including imaging, microbiology, ancillary and laboratory) are listed below for reference.    Imaging Studies: DG Ankle Complete Left  Result Date: 04/19/2021 Left ankle x-rays Status post ORIF left ankle complicated by osteomyelitis treated with IV antibiotics for 6 weeks Patient came in today with pain and swelling medial side of his ankle and posteriorly X-rays show intact lateral plate with syndesmotic screw there is a interfrag screw as well on the left there is a medial suture anchor from the previous medial collateral ligament repair/deltoid ligament No signs of acute process The posterior tibia where the osteomyelitis was seen previously shows chronic erosions. Impression intact hardware status post ORIF left ankle Bone changes from prior osteomyelitis Further imaging needed  MR ANKLE LEFT W WO CONTRAST  Result Date: 04/23/2021 CLINICAL DATA:  Aseptic necrosis left ankle EXAM: MRI OF THE LEFT ANKLE WITHOUT AND WITH CONTRAST TECHNIQUE: Multiplanar, multisequence MR imaging of the ankle was performed before and after the administration of intravenous contrast. CONTRAST:  71mL GADAVIST GADOBUTROL 1 MMOL/ML IV SOLN COMPARISON:  None. FINDINGS: TENDONS Peroneal: Peroneal longus tendon intact. Peroneal brevis intact. Posteromedial: Posterior tibialis tendon thickening with intrasubstance signal abnormality representing chronic tear. Flexor hallucis longus tendon intact. Flexor digitorum longus tendon intact. Anterior: Tibialis anterior tendon intact. Extensor hallucis longus tendon intact Extensor digitorum longus tendon intact. Achilles:  Intact. Plantar Fascia: Intact. LIGAMENTS Lateral: Anterior talofibular ligament intact. Calcaneofibular ligament intact. Posterior talofibular ligament intact. Anterior and posterior tibiofibular ligaments intact. Medial: Limited evaluation due to susceptibility artifact. Intermediate signal of the spring ligament concerning for ligamentous sprain, which may be chronic process in the settings of tibialis posterior tendinopathy/altered mechanics. CARTILAGE  Ankle Joint: Small joint effusion. Full-thickness cartilage defect about the posterolateral aspect of the tibiotalar joint at the location of prior osseous erosion. There is however significant decrease in bone marrow edema of the distal tibia. Subchondral cystic changes of the lateral aspect of the talar dome with marrow edema. Subtalar Joints/Sinus Tarsi: Normal subtalar joints. Small subtalar joint effusion. Edema of the fat in the sinus tarsi. Bones: Postsurgical changes with plate and screw fixation of the distal fibula. Surgical anchor in the medial malleolus. Bone marrow edema of the lateral aspect of the talus the subchondral cystic changes as described above. There is significant interval decrease in marrow edema of the distal tibia. Soft Tissue: Multiple small ganglion cysts about the anterior aspect of the hardware at the distal fibula anterior aspect of the tibiotalar joint. IMPRESSION: IMPRESSION 1. Postsurgical changes about the distal fibula and  medial malleolus with significant interval decrease in marrow edema of the distal tibia about the site of prior osseous erosion, likely representing resolved infectious/inflammatory process. 2. Subchondral cystic changes about the lateral aspect of the talar dome with surrounding edema, likely degenerative process due to altered mechanics. 3. Moderate joint effusion about the tibiotalar and subtalar joints. 4. Tendinopathy/chronic tear of the tibialis posterior, remaining tendons appear intact. 5. Ligamentous sprain of the spring ligament as well as heterogeneous appearance of the deltoid ligament likely representing ligamentous sprain in the setting of altered mechanics. Electronically Signed   By: Keane Police D.O.   On: 04/23/2021 10:59   US Venous Img Lower Unilateral Left (DVT)  Result Date: 04/23/2021 CLINICAL DATA:  Left lower extremity pain and edema. EXAM: LEFT LOWER EXTREMITY VENOUS DOPPLER ULTRASOUND TECHNIQUE: Gray-scale sonography with graded  compression, as well as color Doppler and duplex ultrasound were performed to evaluate the lower extremity deep venous systems from the level of the common femoral vein and including the common femoral, femoral, profunda femoral, popliteal and calf veins including the posterior tibial, peroneal and gastrocnemius veins when visible. The superficial great saphenous vein was also interrogated. Spectral Doppler was utilized to evaluate flow at rest and with distal augmentation maneuvers in the common femoral, femoral and popliteal veins. COMPARISON:  None. FINDINGS: Contralateral Common Femoral Vein: Respiratory phasicity is normal and symmetric with the symptomatic side. No evidence of thrombus. Normal compressibility. Common Femoral Vein: No evidence of thrombus. Normal compressibility, respiratory phasicity and response to augmentation. Saphenofemoral Junction: No evidence of thrombus. Normal compressibility and flow on color Doppler imaging. Profunda Femoral Vein: No evidence of thrombus. Normal compressibility and flow on color Doppler imaging. Femoral Vein: No evidence of thrombus. Normal compressibility, respiratory phasicity and response to augmentation. Popliteal Vein: No evidence of thrombus. Normal compressibility, respiratory phasicity and response to augmentation. Calf Veins: No evidence of thrombus. Normal compressibility and flow on color Doppler imaging. Superficial Great Saphenous Vein: No evidence of thrombus. Normal compressibility. Venous Reflux:  None. Other Findings: No evidence of superficial thrombophlebitis or abnormal fluid collection. IMPRESSION: No evidence of left lower extremity deep venous thrombosis. Electronically Signed   By: Aletta Edouard M.D.   On: 04/23/2021 10:43   [place] EKG SITE RITE  Result Date: 04/22/2021 If Site Rite image not attached, placement could not be confirmed due to current cardiac rhythm.   Microbiology: Results for orders placed or performed during the  hospital encounter of 04/21/21  Resp Panel by RT-PCR (Flu A&B, Covid) Nasopharyngeal Swab     Status: None   Collection Time: 04/21/21  4:40 PM   Specimen: Nasopharyngeal Swab; Nasopharyngeal(NP) swabs in vial transport medium  Result Value Ref Range Status   SARS Coronavirus 2 by RT PCR NEGATIVE NEGATIVE Final    Comment: (NOTE) SARS-CoV-2 target nucleic acids are NOT DETECTED.  The SARS-CoV-2 RNA is generally detectable in upper respiratory specimens during the acute phase of infection. The lowest concentration of SARS-CoV-2 viral copies this assay can detect is 138 copies/mL. A negative result does not preclude SARS-Cov-2 infection and should not be used as the sole basis for treatment or other patient management decisions. A negative result may occur with  improper specimen collection/handling, submission of specimen other than nasopharyngeal swab, presence of viral mutation(s) within the areas targeted by this assay, and inadequate number of viral copies(<138 copies/mL). A negative result must be combined with clinical observations, patient history, and epidemiological information. The expected result is Negative.  Fact Sheet for  Patients:  EntrepreneurPulse.com.au  Fact Sheet for Healthcare Providers:  IncredibleEmployment.be  This test is no t yet approved or cleared by the [place] FDA and  has been authorized for detection and/or diagnosis of SARS-CoV-2 by FDA under an Emergency Use Authorization (EUA). This EUA will remain  in effect (meaning this test can be used) for the duration of the COVID-19 declaration under Section 564(b)(1) of the Act, 21 U.S.C.section 360bbb-3(b)(1), unless the authorization is terminated  or revoked sooner.       Influenza A by PCR NEGATIVE NEGATIVE Final   Influenza B by PCR NEGATIVE NEGATIVE Final    Comment: (NOTE) The Xpert Xpress SARS-CoV-2/FLU/RSV plus assay is intended as an aid in the  diagnosis of influenza from Nasopharyngeal swab specimens and should not be used as a sole basis for treatment. Nasal washings and aspirates are unacceptable for Xpert Xpress SARS-CoV-2/FLU/RSV testing.  Fact Sheet for Patients: EntrepreneurPulse.com.au  Fact Sheet for Healthcare Providers: IncredibleEmployment.be  This test is not yet approved or cleared by the [place] FDA and has been authorized for detection and/or diagnosis of SARS-CoV-2 by FDA under an Emergency Use Authorization (EUA). This EUA will remain in effect (meaning this test can be used) for the duration of the COVID-19 declaration under Section 564(b)(1) of the Act, 21 U.S.C. section 360bbb-3(b)(1), unless the authorization is terminated or revoked.  Performed at [facility], [address] [place], [place] [address]   Surgical pcr screen     Status: None   Collection Time: 04/23/21  1:21 AM   Specimen: Nasal Mucosa; Nasal Swab  Result Value Ref Range Status   MRSA, PCR NEGATIVE NEGATIVE Final   Staphylococcus aureus NEGATIVE NEGATIVE Final    Comment: (NOTE) The Xpert SA Assay (FDA approved for NASAL specimens in patients 64 years of age and older), is one component of a comprehensive surveillance program. It is not intended to diagnose infection nor to guide or monitor treatment. Performed at [facility] [place] [facility], [address], [place], [place] [address]   Aerobic/Anaerobic Culture w Gram Stain (surgical/deep wound)     Status: None (Preliminary result)   Collection Time: 04/24/21  8:22 AM   Specimen: Wound; Tissue  Result Value Ref Range Status   Specimen Description JOINT FLUID  Final   Special Requests LEFT ANKLE SWAB A  Final   Gram Stain NO WBC SEEN NO ORGANISMS SEEN   Final   Culture   Final    NO GROWTH 2 DAYS Performed at [facility], [address], [place], [place] [address]    Report Status PENDING  Incomplete  Aerobic/Anaerobic Culture w Gram  Stain (surgical/deep wound)     Status: None (Preliminary result)   Collection Time: 04/24/21  8:39 AM  Result Value Ref Range Status   Specimen Description JOINT FLUID  Final   Special Requests LEFT ANKLE SWAB,SAMPLE B  Final   Gram Stain NO WBC SEEN NO ORGANISMS SEEN   Final   Culture   Final    NO GROWTH 2 DAYS Performed at [facility], [address] [place], [place] [address]    Report Status PENDING  Incomplete    Labs: CBC: Recent Labs  Lab 04/19/21 1111 04/21/21 1653 04/22/21 0418 04/23/21 0426 04/24/21 0414 04/25/21 0429  WBC 6.0 6.2 5.2 5.0 5.0 5.5  NEUTROABS 4,260 4.8  --   --   --   --   HGB 12.2* 12.0* 11.2* 11.0* 11.3* 11.0*  HCT 39.4 41.3  37.2* 38.3* 39.4 36.8*  MCV 83.5 87.1 87.3 86.8 87.6 88.0  PLT 235 204 222 205 233 993   Basic Metabolic Panel: Recent Labs  Lab 04/21/21 1653 04/22/21 0418 04/23/21 0426 04/25/21 0429 04/26/21 0449  NA 137 134* 135 134* 134*  K 4.3 3.8 3.8 4.3 4.1  CL 104 105 104 106 106  CO2 _0 GLUCOSE 109* 109* 109* 123* 107*  BUN _1 CREATININE 1.14 0.95 0.93 0.97 0.88  CALCIUM 8.6* 8.2* 8.3* 8.1* 8.2*   Liver Function Tests: Recent Labs  Lab 04/19/21 1111  AST 12  ALT 12  BILITOT 0.7  PROT 7.4   CBG: Recent Labs  Lab 04/25/21 0753 04/25/21 1114 04/25/21 1624 04/25/21 2135 04/26/21 0710  GLUCAP 116* 105* 134* 101* 108*    Discharge time spent: greater than 30 minutes.  Signed: Rodena Goldmann, DO [facility] 04/26/2021

## 2021-04-26 NOTE — TOC Transition Note (Signed)
Transition of Care [facility]) - CM/SW Discharge Note   Patient Details  Name: Angel Norman MRN: [id number] Date of Birth: Mar 23, 1932  Transition of Care [facility]) CM/SW Contact:  Boneta Lucks, RN Phone Number: 04/26/2021, 11:30 AM   Clinical Narrative:   Patient discharging home. MD ordered PT/OT/RN. Georgina Snell with [facility] updated.  Patient had [facility] in the past, they could not provided a Therapist, sports. Added [facility] to AVS, updated Georgina Snell with DC plan.   Final next level of care: [place] Barriers to Discharge: Barriers Resolved   Patient Goals and CMS Choice Patient states their goals for this hospitalization and ongoing recovery are:: return home CMS Medicare.gov Compare Post Acute Care list provided to:: Patient Choice offered to / list presented to : Patient, Adult Children  Discharge Placement              Patient and family notified of of transfer: 04/26/21  Discharge Plan and Services In-house Referral: Clinical Social Work   Post Acute Care Choice: Home Health             HH Arranged: RN, PT, OT, IV Antibiotics HH Agency: [place] Date [place]: 04/26/21 Time [place]: 1129 Representative spoke with at [place]: Georgina Snell  Readmission Risk Interventions Readmission Risk Prevention Plan 04/26/2021  Transportation Screening Complete  PCP or Specialist Appt within 5-7 Days Complete  Home Care Screening Complete  Medication Review (RN CM) Complete  Some recent data might be hidden

## 2021-04-29 LAB — AEROBIC/ANAEROBIC CULTURE W GRAM STAIN (SURGICAL/DEEP WOUND)
Culture: NO GROWTH
Culture: NO GROWTH
Gram Stain: NONE SEEN
Gram Stain: NONE SEEN

## 2021-04-30 ENCOUNTER — Ambulatory Visit: Payer: Medicare PPO | Admitting: Orthopedic Surgery

## 2021-05-08 ENCOUNTER — Other Ambulatory Visit: Payer: Self-pay

## 2021-05-08 ENCOUNTER — Ambulatory Visit (INDEPENDENT_AMBULATORY_CARE_PROVIDER_SITE_OTHER): Payer: Medicare PPO | Admitting: Orthopedic Surgery

## 2021-05-08 DIAGNOSIS — S82892D Other fracture of left lower leg, subsequent encounter for closed fracture with routine healing: Secondary | ICD-10-CM

## 2021-05-08 DIAGNOSIS — Z09 Encounter for follow-up examination after completed treatment for conditions other than malignant neoplasm: Secondary | ICD-10-CM

## 2021-05-08 NOTE — Progress Notes (Signed)
Chief Complaint  ?Patient presents with  ? Routine Post Op  ?  DOS 04/24/21 ?LT ankle  ? ?Diagnosis ? ?Status post ORIF left ankle September 13, 2020 ? ?Complicated by osteomyelitis treated with 6 weeks of IV antibiotics ? ?Status post exploration left ankle debridement and repair posterior tibial tendon 04/24/2021 ? ?Postop day #14 ? ?The patient's wound looks good the staples were removed dressing was applied patient will continue on oral antibiotics for prophylactic reasons status post repair of his posterior tibial tendon with history of osteomyelitis back in July 2022 ? ?Encounter Diagnoses  ?Name Primary?  ? Closed fracture of left ankle with routine healing, subsequent encounter Yes  ? Postop check   ? ? ?No orders of the defined types were placed in this encounter. ? ? ? ? ?

## 2021-05-16 ENCOUNTER — Telehealth: Payer: Self-pay | Admitting: Orthopedic Surgery

## 2021-05-16 NOTE — Telephone Encounter (Signed)
Voice message received from [place], [facility] care, [phone], relays that she is reporting to Dr Romeo Apple about a scabbed area on left ankle. Please advise. ?

## 2021-05-16 NOTE — Telephone Encounter (Signed)
Spoke with Veronda Prude nurse. She stated that patient has a scabbed area above his incision. His incision looks great. She doesn't think he understands he can pump the boot up to relieve pressure off of it. She just wanted to make you aware.  ?

## 2021-05-23 ENCOUNTER — Ambulatory Visit (INDEPENDENT_AMBULATORY_CARE_PROVIDER_SITE_OTHER): Payer: Medicare PPO | Admitting: Orthopedic Surgery

## 2021-05-23 ENCOUNTER — Other Ambulatory Visit: Payer: Self-pay

## 2021-05-23 DIAGNOSIS — Z09 Encounter for follow-up examination after completed treatment for conditions other than malignant neoplasm: Secondary | ICD-10-CM

## 2021-05-23 DIAGNOSIS — L03116 Cellulitis of left lower limb: Secondary | ICD-10-CM

## 2021-05-23 DIAGNOSIS — R202 Paresthesia of skin: Secondary | ICD-10-CM

## 2021-05-23 DIAGNOSIS — M76822 Posterior tibial tendinitis, left leg: Secondary | ICD-10-CM

## 2021-05-23 MED ORDER — DOXYCYCLINE HYCLATE 100 MG PO TABS: 100.0000 mg | ORAL_TABLET | Freq: Two times a day (BID) | ORAL | 0 refills | Status: DC

## 2021-05-23 MED ORDER — GABAPENTIN 100 MG PO CAPS: 100.0000 mg | ORAL_CAPSULE | Freq: Three times a day (TID) | ORAL | 0 refills | Status: DC

## 2021-05-23 NOTE — Progress Notes (Signed)
FOLLOW UP  ? ?Encounter Diagnoses  ?Name Primary?  ? Postop check Yes  ? Posterior tibial tendon dysfunction, left   ? ? ? ?Chief Complaint  ?Patient presents with  ? Routine Post Op  ?  DOS 04/24/21 ?LT ankle  ? ? ? ?88 year old male status post ORIF left ankle September 13, 2020 ? ?Complicated by osteomyelitis treated with 6 weeks of IV antibiotics ? ?Status post exploration of left ankle debridement and repair posterior tibial tendon 04/24/2021 ? ?He is now postop day #29 ? ?He has been managed with a cam walker and oral antibiotics and some gabapentin for paresthesias ? ?His paresthesias are controlled with only little bit of burning over the medial side of the ankle ? ?He is walking today well with his cam boot and cane although his daughter says he did a lot of work the day before in the yard and had some swelling and was not able to weight-bear as well then ? ?Recommend AFO for the PTTD repair with biotech prescription was written and the prescription is in the media section I took a picture of it ? ?I refilled his gabapentin and doxycycline ? ?Next follow-up in 4 weeks ? ?Encounter Diagnoses  ?Name Primary?  ? Postop check   ? Posterior tibial tendon dysfunction, left   ? Paresthesias   ? Cellulitis of left ankle Yes  ? ? ?

## 2021-05-29 ENCOUNTER — Telehealth: Payer: Self-pay

## 2021-05-29 NOTE — Telephone Encounter (Signed)
Completed prescription for AFO for the PTTD repair with biotech prescription and last face to face ov notes faxed to [phone] ?

## 2021-06-17 ENCOUNTER — Other Ambulatory Visit: Payer: Self-pay | Admitting: Orthopedic Surgery

## 2021-06-17 DIAGNOSIS — L03116 Cellulitis of left lower limb: Secondary | ICD-10-CM

## 2021-06-25 DIAGNOSIS — M76822 Posterior tibial tendinitis, left leg: Secondary | ICD-10-CM | POA: Diagnosis not present

## 2021-07-03 ENCOUNTER — Other Ambulatory Visit: Payer: Self-pay | Admitting: Orthopedic Surgery

## 2021-07-03 DIAGNOSIS — R202 Paresthesia of skin: Secondary | ICD-10-CM

## 2021-07-04 ENCOUNTER — Telehealth: Payer: Self-pay | Admitting: Orthopedic Surgery

## 2021-07-04 ENCOUNTER — Ambulatory Visit (INDEPENDENT_AMBULATORY_CARE_PROVIDER_SITE_OTHER): Payer: Medicare PPO | Admitting: Orthopedic Surgery

## 2021-07-04 DIAGNOSIS — S82892D Other fracture of left lower leg, subsequent encounter for closed fracture with routine healing: Secondary | ICD-10-CM

## 2021-07-04 DIAGNOSIS — M869 Osteomyelitis, unspecified: Secondary | ICD-10-CM | POA: Diagnosis not present

## 2021-07-04 DIAGNOSIS — L03116 Cellulitis of left lower limb: Secondary | ICD-10-CM

## 2021-07-04 DIAGNOSIS — M76822 Posterior tibial tendinitis, left leg: Secondary | ICD-10-CM | POA: Diagnosis not present

## 2021-07-04 NOTE — Telephone Encounter (Signed)
Patient's daughter Dossie Arbour [phone]  came back in following today's office visit with question about how long to wear brace and about continuing medication Gabapentin and also Doxycycline.* ? *Clinic staff assistant Abby and Dr Romeo Apple were available to answer questions. ? *Done ?

## 2021-07-04 NOTE — Progress Notes (Signed)
FOLLOW UP  ? ?Encounter Diagnoses  ?Name Primary?  ? Posterior tibial tendon dysfunction, left Yes  ? Cellulitis of left ankle   ? Closed fracture of left ankle with routine healing, subsequent encounter   ? Osteomyelitis of left ankle, unspecified type (HCC)   ? ? ? ?Chief Complaint  ?Patient presents with  ? Post-op Follow-up  ?  DOS 04/24/21 Repair posterior tibial tendon with  ?Tenosynovectomy left ankle, and left ankle joint aspiration (Left)  ? ? ?Routine checkup ? ?The patient has gotten his brace and he is got his extrawide shoe is doing well he is working in the yard he is feeding his goats he is feeding the deer which he likes to do ? ?His foot looks okay he is getting used to the AFO type brace that he was fitted for ? ?I think he is doing fine right now there are no signs of infection he is walking with a cane he is happy we will see him as needed ? ?

## 2021-07-04 NOTE — Patient Instructions (Signed)
Follow up as needed

## 2021-07-24 DIAGNOSIS — E782 Mixed hyperlipidemia: Secondary | ICD-10-CM | POA: Diagnosis not present

## 2021-07-24 DIAGNOSIS — R350 Frequency of micturition: Secondary | ICD-10-CM | POA: Diagnosis not present

## 2021-07-24 DIAGNOSIS — K219 Gastro-esophageal reflux disease without esophagitis: Secondary | ICD-10-CM | POA: Diagnosis not present

## 2021-07-24 DIAGNOSIS — Z0000 Encounter for general adult medical examination without abnormal findings: Secondary | ICD-10-CM | POA: Diagnosis not present

## 2021-07-24 DIAGNOSIS — N400 Enlarged prostate without lower urinary tract symptoms: Secondary | ICD-10-CM | POA: Diagnosis not present

## 2021-07-24 DIAGNOSIS — E1169 Type 2 diabetes mellitus with other specified complication: Secondary | ICD-10-CM | POA: Diagnosis not present

## 2021-07-24 DIAGNOSIS — M1990 Unspecified osteoarthritis, unspecified site: Secondary | ICD-10-CM | POA: Diagnosis not present

## 2021-09-06 DIAGNOSIS — N390 Urinary tract infection, site not specified: Secondary | ICD-10-CM | POA: Diagnosis not present

## 2021-11-19 ENCOUNTER — Other Ambulatory Visit: Payer: Self-pay | Admitting: Orthopedic Surgery

## 2021-11-19 DIAGNOSIS — S82899A Other fracture of unspecified lower leg, initial encounter for closed fracture: Secondary | ICD-10-CM

## 2021-12-26 DIAGNOSIS — Z20828 Contact with and (suspected) exposure to other viral communicable diseases: Secondary | ICD-10-CM | POA: Diagnosis not present

## 2022-02-12 DIAGNOSIS — L989 Disorder of the skin and subcutaneous tissue, unspecified: Secondary | ICD-10-CM | POA: Diagnosis not present

## 2022-02-12 DIAGNOSIS — E1169 Type 2 diabetes mellitus with other specified complication: Secondary | ICD-10-CM | POA: Diagnosis not present

## 2022-02-12 DIAGNOSIS — N182 Chronic kidney disease, stage 2 (mild): Secondary | ICD-10-CM | POA: Diagnosis not present

## 2022-02-12 DIAGNOSIS — E782 Mixed hyperlipidemia: Secondary | ICD-10-CM | POA: Diagnosis not present

## 2022-02-12 DIAGNOSIS — Z23 Encounter for immunization: Secondary | ICD-10-CM | POA: Diagnosis not present

## 2022-04-15 ENCOUNTER — Other Ambulatory Visit: Payer: Self-pay | Admitting: Orthopedic Surgery

## 2022-04-15 DIAGNOSIS — S82892D Other fracture of left lower leg, subsequent encounter for closed fracture with routine healing: Secondary | ICD-10-CM

## 2022-04-15 DIAGNOSIS — L03116 Cellulitis of left lower limb: Secondary | ICD-10-CM

## 2022-07-12 DIAGNOSIS — C44319 Basal cell carcinoma of skin of other parts of face: Secondary | ICD-10-CM | POA: Diagnosis not present

## 2022-07-12 DIAGNOSIS — D485 Neoplasm of uncertain behavior of skin: Secondary | ICD-10-CM | POA: Diagnosis not present

## 2022-08-01 DIAGNOSIS — M1990 Unspecified osteoarthritis, unspecified site: Secondary | ICD-10-CM | POA: Diagnosis not present

## 2022-08-01 DIAGNOSIS — Z0000 Encounter for general adult medical examination without abnormal findings: Secondary | ICD-10-CM | POA: Diagnosis not present

## 2022-08-01 DIAGNOSIS — E782 Mixed hyperlipidemia: Secondary | ICD-10-CM | POA: Diagnosis not present

## 2022-08-01 DIAGNOSIS — E1169 Type 2 diabetes mellitus with other specified complication: Secondary | ICD-10-CM | POA: Diagnosis not present

## 2022-08-01 DIAGNOSIS — E119 Type 2 diabetes mellitus without complications: Secondary | ICD-10-CM | POA: Diagnosis not present

## 2022-08-01 DIAGNOSIS — N400 Enlarged prostate without lower urinary tract symptoms: Secondary | ICD-10-CM | POA: Diagnosis not present

## 2022-08-01 DIAGNOSIS — K219 Gastro-esophageal reflux disease without esophagitis: Secondary | ICD-10-CM | POA: Diagnosis not present

## 2022-08-12 DIAGNOSIS — C44311 Basal cell carcinoma of skin of nose: Secondary | ICD-10-CM | POA: Diagnosis not present

## 2022-09-10 DIAGNOSIS — R410 Disorientation, unspecified: Secondary | ICD-10-CM | POA: Diagnosis not present

## 2022-09-10 DIAGNOSIS — N390 Urinary tract infection, site not specified: Secondary | ICD-10-CM | POA: Diagnosis not present

## 2022-09-10 DIAGNOSIS — E119 Type 2 diabetes mellitus without complications: Secondary | ICD-10-CM | POA: Diagnosis not present

## 2022-09-10 DIAGNOSIS — R300 Dysuria: Secondary | ICD-10-CM | POA: Diagnosis not present

## 2022-09-24 DIAGNOSIS — N390 Urinary tract infection, site not specified: Secondary | ICD-10-CM | POA: Diagnosis not present

## 2022-09-24 DIAGNOSIS — R300 Dysuria: Secondary | ICD-10-CM | POA: Diagnosis not present

## 2022-09-30 DIAGNOSIS — M9905 Segmental and somatic dysfunction of pelvic region: Secondary | ICD-10-CM | POA: Diagnosis not present

## 2022-09-30 DIAGNOSIS — M9902 Segmental and somatic dysfunction of thoracic region: Secondary | ICD-10-CM | POA: Diagnosis not present

## 2022-09-30 DIAGNOSIS — M9903 Segmental and somatic dysfunction of lumbar region: Secondary | ICD-10-CM | POA: Diagnosis not present

## 2022-09-30 DIAGNOSIS — M62830 Muscle spasm of back: Secondary | ICD-10-CM | POA: Diagnosis not present

## 2022-10-03 ENCOUNTER — Other Ambulatory Visit: Payer: Self-pay | Admitting: Family Medicine

## 2022-10-03 ENCOUNTER — Ambulatory Visit
Admission: RE | Admit: 2022-10-03 | Discharge: 2022-10-03 | Disposition: A | Discharge status: Home / Self Care | Payer: Medicare PPO | Source: Ambulatory Visit | Attending: Family Medicine | Admitting: Family Medicine

## 2022-10-03 DIAGNOSIS — M4316 Spondylolisthesis, lumbar region: Secondary | ICD-10-CM | POA: Diagnosis not present

## 2022-10-03 DIAGNOSIS — N182 Chronic kidney disease, stage 2 (mild): Secondary | ICD-10-CM | POA: Diagnosis not present

## 2022-10-03 DIAGNOSIS — E1122 Type 2 diabetes mellitus with diabetic chronic kidney disease: Secondary | ICD-10-CM | POA: Diagnosis not present

## 2022-10-03 DIAGNOSIS — N390 Urinary tract infection, site not specified: Secondary | ICD-10-CM | POA: Diagnosis not present

## 2022-10-03 DIAGNOSIS — M549 Dorsalgia, unspecified: Secondary | ICD-10-CM

## 2022-10-03 DIAGNOSIS — N179 Acute kidney failure, unspecified: Secondary | ICD-10-CM | POA: Diagnosis not present

## 2022-10-03 DIAGNOSIS — M5136 Other intervertebral disc degeneration, lumbar region: Secondary | ICD-10-CM | POA: Diagnosis not present

## 2022-10-03 DIAGNOSIS — M545 Low back pain, unspecified: Secondary | ICD-10-CM | POA: Diagnosis not present

## 2022-10-03 DIAGNOSIS — Z993 Dependence on wheelchair: Secondary | ICD-10-CM | POA: Diagnosis not present

## 2022-12-17 ENCOUNTER — Ambulatory Visit: Payer: Medicare PPO | Admitting: Urology

## 2022-12-23 ENCOUNTER — Ambulatory Visit: Payer: Medicare PPO | Admitting: Urology

## 2022-12-23 ENCOUNTER — Encounter: Payer: Self-pay | Admitting: Urology

## 2022-12-23 VITALS — BP 151/75 | HR 93

## 2022-12-23 DIAGNOSIS — N401 Enlarged prostate with lower urinary tract symptoms: Secondary | ICD-10-CM | POA: Diagnosis not present

## 2022-12-23 DIAGNOSIS — R3981 Functional urinary incontinence: Secondary | ICD-10-CM

## 2022-12-23 DIAGNOSIS — R3129 Other microscopic hematuria: Secondary | ICD-10-CM | POA: Diagnosis not present

## 2022-12-23 DIAGNOSIS — R3914 Feeling of incomplete bladder emptying: Secondary | ICD-10-CM | POA: Diagnosis not present

## 2022-12-23 LAB — BLADDER SCAN AMB NON-IMAGING: Scan Result: 294

## 2022-12-23 MED ORDER — FINASTERIDE 5 MG PO TABS: 5.0000 mg | ORAL_TABLET | Freq: Every day | ORAL | 3 refills | Status: AC

## 2022-12-23 NOTE — Progress Notes (Signed)
post void residual= 294

## 2022-12-23 NOTE — Progress Notes (Signed)
12/23/2022 3:25 PM   Angel Norman 08-04-1932 [id number]  Referring provider: Lupita Raider, MD [address] [place],  [place] [address]  No chief complaint on file.   HPI:  New pt  -   1) BPH - followed for years by Dr. Retta Diones in [place]. On tamsulosin.  2) bacteriuria - long standing for many years. A 2016 CT was benign with mild BPH.   3) PSA elevation - PSA was 6.5 in 2009 and 4.05 in 2010. A 2016 CT was benign.   Today, seen for the above. More incontinence. Urgency. Has a weak stream. On tolterodine recently which hasn't helped. He has dry mouth. He also took abx with no change in incontinence. He stopped Jardiance. PVR 274 ml. No constipation. No NG risk. He is a good water drinker.   No dysuria or gross hematuria today. UA with 3-10 rbc and no bacteria.   He was maintenance at schools. 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     PMH: Past Medical History:  Diagnosis Date   Arthritis    GERD (gastroesophageal reflux disease)    Hyperlipidemia    Rib fractures 12/06/2014    Surgical History: Past Surgical History:  Procedure Laterality Date   ESOPHAGOGASTRODUODENOSCOPY (EGD) WITH PROPOFOL N/A 05/08/2018   Procedure: ESOPHAGOGASTRODUODENOSCOPY (EGD) WITH PROPOFOL;  Surgeon: Malissa Hippo, MD;  [facility];  Service: Endoscopy;  Laterality: N/A;   goiter removal     INCISION AND DRAINAGE Left 04/24/2021   Procedure: tenosynovectomy, left ankle joint aspiration;  Surgeon: Vickki Hearing, MD;  Location: [facility];  Service: Orthopedics;  Laterality: Left;   ORIF ANKLE FRACTURE Left 09/13/2020   Procedure: OPEN REDUCTION INTERNAL FIXATION (ORIF) LEFT ANKLE FRACTURE;  Surgeon: Vickki Hearing, MD;  Location: [facility];  Service: Orthopedics;  Laterality: Left;   TOTAL KNEE ARTHROPLASTY Left 08/18/2012   Dr Lajoyce Corners   TOTAL KNEE ARTHROPLASTY Left 08/19/2012   Procedure: TOTAL KNEE ARTHROPLASTY;  Surgeon:  Nadara Mustard, MD;  [facility] OR;  Service: Orthopedics;  Laterality: Left;  Left Total Knee Arthroplasty    Home Medications:  Allergies as of 12/23/2022   No Known Allergies      Medication List        Accurate as of December 23, 2022  3:25 PM. If you have any questions, ask your nurse or doctor.          acetaminophen 325 MG tablet Commonly known as: TYLENOL Take 2 tablets (650 mg total) by mouth every 6 (six) hours.   docusate sodium 100 MG capsule Commonly known as: COLACE Take 1 capsule (100 mg total) by mouth 2 (two) times daily.   doxycycline 100 MG tablet Commonly known as: VIBRA-TABS TAKE 1 TABLET BY MOUTH TWICE A DAY   gabapentin 100 MG capsule Commonly known as: NEURONTIN TAKE 1 CAPSULE (100 MG TOTAL) BY MOUTH THREE TIMES DAILY.   HYDROcodone-acetaminophen 7.5-325 MG tablet Commonly known as: NORCO Take 1-2 tablets by mouth every 4 (four) hours as needed for severe pain (pain score 7-10).   Jardiance 25 MG Tabs tablet Generic drug: empagliflozin Take 25 mg by mouth every morning.   metFORMIN 1000 MG tablet Commonly known as: GLUCOPHAGE Take 1,000 mg by mouth 2 (two) times daily.   polyethylene glycol 17 g packet Commonly known as: MIRALAX / GLYCOLAX Take 17 g by mouth daily as needed for mild constipation.   pravastatin 80  MG tablet Commonly known as: PRAVACHOL Take 80 mg by mouth daily.   tamsulosin 0.4 MG Caps capsule Commonly known as: FLOMAX Take 0.4 mg by mouth daily.   traMADol 50 MG tablet Commonly known as: ULTRAM Take 1 tablet (50 mg total) by mouth every 6 (six) hours as needed.        Allergies: No Known Allergies  Family History: Family History  Family history unknown: Yes    Social History:  reports that he has never smoked. He has never used smokeless tobacco. He reports that he does not drink alcohol and does not use drugs.   Physical Exam: There were no vitals taken for this visit.  Constitutional:  Alert and  oriented, No acute distress. HEENT: [place] AT, moist mucus membranes.  Trachea midline, no masses. Cardiovascular: No clubbing, cyanosis, or edema. Respiratory: Normal respiratory effort, no increased work of breathing. GI: Abdomen is soft, nontender, nondistended, no abdominal masses GU: No CVA tenderness Skin: No rashes, bruises or suspicious lesions. Neurologic: Grossly intact, no focal deficits, moving all 4 extremities. Psychiatric: Normal mood and affect. GU: Penis uncircumcised, normal foreskin, testicles descended bilaterally and palpably normal, bilateral epididymis palpably normal, scrotum normal DRE: Prostate 40 g, smooth without hard area or nodule   Laboratory Data: Lab Results  Component Value Date   WBC 5.5 04/25/2021   HGB 11.0 (L) 04/25/2021   HCT 36.8 (L) 04/25/2021   MCV 88.0 04/25/2021   PLT 234 04/25/2021    Lab Results  Component Value Date   CREATININE 0.88 04/26/2021    No results found for: "PSA"  No results found for: "TESTOSTERONE"  Lab Results  Component Value Date   HGBA1C 5.7 (H) 04/21/2021    Urinalysis    Component Value Date/Time   COLORURINE YELLOW 04/22/2021 2050   APPEARANCEUR CLOUDY (A) 04/22/2021 2050   LABSPEC 1.022 04/22/2021 2050   PHURINE 5.0 04/22/2021 2050   GLUCOSEU >=500 (A) 04/22/2021 2050   HGBUR SMALL (A) 04/22/2021 2050   BILIRUBINUR NEGATIVE 04/22/2021 2050   KETONESUR NEGATIVE 04/22/2021 2050   PROTEINUR NEGATIVE 04/22/2021 2050   NITRITE NEGATIVE 04/22/2021 2050   LEUKOCYTESUR LARGE (A) 04/22/2021 2050    Lab Results  Component Value Date   BACTERIA NONE SEEN 11/28/2020    Pertinent Imaging: N/a   Assessment & Plan:    Bph, incontinence - first we need to focus on better emptying. Disc nature r/b of adding 5ari - disc FDA warnings. He will start finasteride. Stop tolterodine.   Microhematuria - as above.   No follow-ups on file.  Jerilee Field, MD  [facility]  [address] [place], [place] [address] [phone]

## 2022-12-24 LAB — URINALYSIS, ROUTINE W REFLEX MICROSCOPIC
Bilirubin, UA: NEGATIVE
Glucose, UA: NEGATIVE
Ketones, UA: NEGATIVE
Nitrite, UA: NEGATIVE
Protein,UA: NEGATIVE
Specific Gravity, UA: <1.005 — ABNORMAL LOW (ref 1.005–1.030)
Urobilinogen, Ur: 0.2 mg/dL (ref 0.2–1.0)
pH, UA: 6 (ref 5.0–7.5)

## 2022-12-24 LAB — MICROSCOPIC EXAMINATION
Bacteria, UA: NONE SEEN
WBC, UA: >30 /[HPF] — AB (ref 0–5)

## 2023-02-03 ENCOUNTER — Other Ambulatory Visit: Payer: Medicare PPO | Admitting: Urology

## 2023-03-21 DIAGNOSIS — N183 Chronic kidney disease, stage 3 unspecified: Secondary | ICD-10-CM | POA: Diagnosis not present

## 2023-03-21 DIAGNOSIS — E1122 Type 2 diabetes mellitus with diabetic chronic kidney disease: Secondary | ICD-10-CM | POA: Diagnosis not present

## 2023-03-21 DIAGNOSIS — E782 Mixed hyperlipidemia: Secondary | ICD-10-CM | POA: Diagnosis not present

## 2023-03-21 DIAGNOSIS — N400 Enlarged prostate without lower urinary tract symptoms: Secondary | ICD-10-CM | POA: Diagnosis not present

## 2023-04-21 DIAGNOSIS — I10 Essential (primary) hypertension: Secondary | ICD-10-CM | POA: Diagnosis not present

## 2023-04-21 DIAGNOSIS — R062 Wheezing: Secondary | ICD-10-CM | POA: Diagnosis not present

## 2023-04-21 DIAGNOSIS — N183 Chronic kidney disease, stage 3 unspecified: Secondary | ICD-10-CM | POA: Diagnosis not present

## 2023-04-21 DIAGNOSIS — E1169 Type 2 diabetes mellitus with other specified complication: Secondary | ICD-10-CM | POA: Diagnosis not present

## 2023-04-21 DIAGNOSIS — J988 Other specified respiratory disorders: Secondary | ICD-10-CM | POA: Diagnosis not present

## 2023-04-21 DIAGNOSIS — R54 Age-related physical debility: Secondary | ICD-10-CM | POA: Diagnosis not present

## 2023-05-05 ENCOUNTER — Ambulatory Visit: Payer: Medicare PPO | Admitting: Urology

## 2023-05-05 ENCOUNTER — Encounter: Payer: Self-pay | Admitting: Urology

## 2023-05-05 VITALS — BP 123/74 | HR 97

## 2023-05-05 DIAGNOSIS — R3914 Feeling of incomplete bladder emptying: Secondary | ICD-10-CM

## 2023-05-05 DIAGNOSIS — R32 Unspecified urinary incontinence: Secondary | ICD-10-CM | POA: Diagnosis not present

## 2023-05-05 DIAGNOSIS — R3915 Urgency of urination: Secondary | ICD-10-CM | POA: Diagnosis not present

## 2023-05-05 DIAGNOSIS — N401 Enlarged prostate with lower urinary tract symptoms: Secondary | ICD-10-CM | POA: Diagnosis not present

## 2023-05-05 LAB — URINALYSIS, ROUTINE W REFLEX MICROSCOPIC
Bilirubin, UA: NEGATIVE
Glucose, UA: NEGATIVE
Ketones, UA: NEGATIVE
Nitrite, UA: NEGATIVE
Protein,UA: NEGATIVE
Specific Gravity, UA: 1.015 (ref 1.005–1.030)
Urobilinogen, Ur: 0.2 mg/dL (ref 0.2–1.0)
pH, UA: 6.5 (ref 5.0–7.5)

## 2023-05-05 LAB — MICROSCOPIC EXAMINATION: WBC, UA: >30 /HPF — ABNORMAL HIGH (ref 0–5)

## 2023-05-05 NOTE — Progress Notes (Unsigned)
 05/05/2023 11:21 AM   Angel Norman Mar 02, 1933 [id number]  Referring provider: Lupita Raider, MD [address] [place],  [place] [address]  No chief complaint on file.   HPI:  F/u -    1) BPH - followed for years by Dr. Retta Diones in [place]. On tamsulosin. More incontinence. Urgency. Has a weak stream. On tolterodine recently which hasn't helped. He has dry mouth. He also took abx with no change in incontinence. He stopped Jardiance. PVR 274 ml. No constipation. No NG risk. He is a good water drinker.    2) bacteriuria - long standing for many years. A 2016 CT was benign with mild BPH.    3) PSA elevation - PSA was 6.5 in 2009 and 4.05 in 2010. A 2016 CT was benign.    Today, seen for the above. In Oct 2024 we stopped tolterodine and started finasteride due to inc LUTS, incontinence and higher PVR. He has a urinal or "pot" he voids in at night. Daughter not sure if he is missing or has incontinence. He wont wear adult briefs.     He was maintenance at schools. 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    PMH: Past Medical History:  Diagnosis Date   Arthritis    GERD (gastroesophageal reflux disease)    Hyperlipidemia    Rib fractures 12/06/2014    Surgical History: Past Surgical History:  Procedure Laterality Date   ESOPHAGOGASTRODUODENOSCOPY (EGD) WITH PROPOFOL N/A 05/08/2018   Procedure: ESOPHAGOGASTRODUODENOSCOPY (EGD) WITH PROPOFOL;  Surgeon: Malissa Hippo, MD;  [facility];  Service: Endoscopy;  Laterality: N/A;   goiter removal     INCISION AND DRAINAGE Left 04/24/2021   Procedure: tenosynovectomy, left ankle joint aspiration;  Surgeon: Vickki Hearing, MD;  Location: [facility];  Service: Orthopedics;  Laterality: Left;   ORIF ANKLE FRACTURE Left 09/13/2020   Procedure: OPEN REDUCTION INTERNAL FIXATION (ORIF) LEFT ANKLE FRACTURE;  Surgeon: Vickki Hearing, MD;  Location: [facility];  Service: Orthopedics;   Laterality: Left;   TOTAL KNEE ARTHROPLASTY Left 08/18/2012   Dr Lajoyce Corners   TOTAL KNEE ARTHROPLASTY Left 08/19/2012   Procedure: TOTAL KNEE ARTHROPLASTY;  Surgeon: Nadara Mustard, MD;  Location: [facility] OR;  Service: Orthopedics;  Laterality: Left;  Left Total Knee Arthroplasty    Home Medications:  Allergies as of 05/05/2023   No Known Allergies      Medication List        Accurate as of May 05, 2023 11:21 AM. If you have any questions, ask your nurse or doctor.          acetaminophen 325 MG tablet Commonly known as: TYLENOL Take 2 tablets (650 mg total) by mouth every 6 (six) hours.   albuterol 108 (90 Base) MCG/ACT inhaler Commonly known as: VENTOLIN HFA Inhale 1 puff into the lungs in the morning and at bedtime.   docusate sodium 100 MG capsule Commonly known as: COLACE Take 1 capsule (100 mg total) by mouth 2 (two) times daily.   doxycycline 100 MG tablet Commonly known as: VIBRA-TABS TAKE 1 TABLET BY MOUTH TWICE A DAY   finasteride 5 MG tablet Commonly known as: PROSCAR Take 1 tablet (5 mg total) by mouth daily.   gabapentin 100 MG capsule Commonly known as: NEURONTIN TAKE 1 CAPSULE (100 MG TOTAL) BY MOUTH THREE TIMES DAILY.   HYDROcodone-acetaminophen 7.5-325 MG tablet Commonly known as: NORCO Take 1-2 tablets by mouth every  4 (four) hours as needed for severe pain (pain score 7-10).   Jardiance 25 MG Tabs tablet Generic drug: empagliflozin Take 25 mg by mouth every morning.   metFORMIN 1000 MG tablet Commonly known as: GLUCOPHAGE Take 1,000 mg by mouth 2 (two) times daily.   pantoprazole 40 MG tablet Commonly known as: PROTONIX Take 40 mg by mouth 2 (two) times daily.   polyethylene glycol 17 g packet Commonly known as: MIRALAX / GLYCOLAX Take 17 g by mouth daily as needed for mild constipation.   pravastatin 80 MG tablet Commonly known as: PRAVACHOL Take 80 mg by mouth daily.   tamsulosin 0.4 MG Caps capsule Commonly known as: FLOMAX Take 0.4 mg by  mouth daily.   traMADol 50 MG tablet Commonly known as: ULTRAM Take 1 tablet (50 mg total) by mouth every 6 (six) hours as needed.        Allergies: No Known Allergies  Family History: Family History  Family history unknown: Yes    Social History:  reports that he has never smoked. He has never used smokeless tobacco. He reports that he does not drink alcohol and does not use drugs.   Physical Exam: BP 123/74   Pulse 97   Constitutional:  Alert and oriented, No acute distress. HEENT: [place] AT, moist mucus membranes.  Trachea midline, no masses. Cardiovascular: No clubbing, cyanosis, or edema. Respiratory: Normal respiratory effort, no increased work of breathing. GI: Abdomen is soft, nontender, nondistended, no abdominal masses GU: No CVA tenderness Skin: No rashes, bruises or suspicious lesions. Neurologic: Grossly intact, no focal deficits, moving all 4 extremities. Psychiatric: Normal mood and affect.  Laboratory Data: Lab Results  Component Value Date   WBC 5.5 04/25/2021   HGB 11.0 (L) 04/25/2021   HCT 36.8 (L) 04/25/2021   MCV 88.0 04/25/2021   PLT 234 04/25/2021    Lab Results  Component Value Date   CREATININE 0.88 04/26/2021    No results found for: "PSA"  No results found for: "TESTOSTERONE"  Lab Results  Component Value Date   HGBA1C 5.7 (H) 04/21/2021    Urinalysis    Component Value Date/Time   COLORURINE YELLOW 04/22/2021 2050   APPEARANCEUR Clear 12/23/2022 1555   LABSPEC 1.022 04/22/2021 2050   PHURINE 5.0 04/22/2021 2050   GLUCOSEU Negative 12/23/2022 1555   HGBUR SMALL (A) 04/22/2021 2050   BILIRUBINUR Negative 12/23/2022 1555   KETONESUR NEGATIVE 04/22/2021 2050   PROTEINUR Negative 12/23/2022 1555   PROTEINUR NEGATIVE 04/22/2021 2050   NITRITE Negative 12/23/2022 1555   NITRITE NEGATIVE 04/22/2021 2050   LEUKOCYTESUR 3+ (A) 12/23/2022 1555   LEUKOCYTESUR LARGE (A) 04/22/2021 2050    Lab Results  Component Value Date    LABMICR See below: 12/23/2022   WBCUA >30 (A) 12/23/2022   LABEPIT 0-10 12/23/2022   BACTERIA None seen 12/23/2022    Pertinent Imaging: N/a  Assessment & Plan:   Bph - cont max medical therapy with tams and finasteride. Discussed OK to use the adult briefs. Discussed OAB meds - will hold off.   No follow-ups on file.  Jerilee Field, MD  [facility]  [address] [place], [place] [address] [phone]

## 2023-08-04 DIAGNOSIS — N400 Enlarged prostate without lower urinary tract symptoms: Secondary | ICD-10-CM | POA: Diagnosis not present

## 2023-08-04 DIAGNOSIS — K219 Gastro-esophageal reflux disease without esophagitis: Secondary | ICD-10-CM | POA: Diagnosis not present

## 2023-08-04 DIAGNOSIS — E782 Mixed hyperlipidemia: Secondary | ICD-10-CM | POA: Diagnosis not present

## 2023-08-04 DIAGNOSIS — Z0000 Encounter for general adult medical examination without abnormal findings: Secondary | ICD-10-CM | POA: Diagnosis not present

## 2023-08-04 DIAGNOSIS — I10 Essential (primary) hypertension: Secondary | ICD-10-CM | POA: Diagnosis not present

## 2023-08-04 DIAGNOSIS — Z1331 Encounter for screening for depression: Secondary | ICD-10-CM | POA: Diagnosis not present

## 2023-08-04 DIAGNOSIS — N183 Chronic kidney disease, stage 3 unspecified: Secondary | ICD-10-CM | POA: Diagnosis not present

## 2023-08-04 DIAGNOSIS — E1122 Type 2 diabetes mellitus with diabetic chronic kidney disease: Secondary | ICD-10-CM | POA: Diagnosis not present

## 2023-10-07 ENCOUNTER — Ambulatory Visit: Admitting: Podiatry

## 2023-10-09 ENCOUNTER — Encounter: Payer: Self-pay | Admitting: Podiatry

## 2023-10-09 ENCOUNTER — Ambulatory Visit: Admitting: Podiatry

## 2023-10-09 DIAGNOSIS — B351 Tinea unguium: Secondary | ICD-10-CM

## 2023-10-09 DIAGNOSIS — L600 Ingrowing nail: Secondary | ICD-10-CM | POA: Diagnosis not present

## 2023-10-09 DIAGNOSIS — M79672 Pain in left foot: Secondary | ICD-10-CM | POA: Diagnosis not present

## 2023-10-09 DIAGNOSIS — M79671 Pain in right foot: Secondary | ICD-10-CM

## 2023-10-09 NOTE — Progress Notes (Signed)
 Patient presents for evaluation and treatment of tenderness and some redness around nails feet.  Tenderness around toes with walking and wearing shoes.  Physical exam:  General appearance: Alert, pleasant, and in no acute distress.  Vascular: Pedal pulses: DP 2/4 B/L, PT 0/4 B/L.  Severe edema lower legs bilaterally.  Capillary refill time immediate bilaterally  Neurological:  Light touch intact bilaterally.  Achilles tendon reflex normal bilaterally no clonus or spasticity.  Vibratory sensation slightly diminished bilaterally  Dermatologic:  Nails thickened, disfigured, discolored 1-5 BL with subungual debris.  Redness and hypertrophic nail folds along nail folds bilaterally but no signs of drainage or infection.  Hallux nails are particularly thickened and dystrophic with redness along the nail folds with hypertrophy of the nail folds.  Musculoskeletal:  Hammertoes 2 through 5 bilaterally.  Normal muscle strength lower extremity   Diagnosis: 1. Painful onychomycotic nails 1 through 5 bilaterally. 2. Pain toes 1 through 5 bilaterally. 3.  Ingrown nails bilaterally  Plan: -New patient office visit for evaluation and management level 3.  Modifier 25. -Discussed thick thick and extremely gryphotic ingrown nails hallux bilaterally.  Call if problems or get ingrown or infected would recommend permanent removal -Debrided onychomycotic nails 1 through 5 bilaterally.  Return 3 months [facility]

## 2023-11-17 ENCOUNTER — Telehealth: Payer: Self-pay

## 2023-11-17 ENCOUNTER — Ambulatory Visit: Admitting: Urology

## 2023-11-17 NOTE — Telephone Encounter (Signed)
 Open in error

## 2023-11-17 NOTE — Telephone Encounter (Signed)
 Patient rescheduled today's visit. Wanted to let doctor know the medication prescribed is helping with frequent urination.

## 2023-11-20 ENCOUNTER — Other Ambulatory Visit: Payer: Self-pay

## 2023-11-20 ENCOUNTER — Emergency Department (HOSPITAL_COMMUNITY)

## 2023-11-20 ENCOUNTER — Encounter (HOSPITAL_COMMUNITY): Payer: Self-pay

## 2023-11-20 ENCOUNTER — Inpatient Hospital Stay (HOSPITAL_COMMUNITY)
Admission: EM | Admit: 2023-11-20 | Discharge: 2023-11-22 | DRG: 683 | Disposition: A | Discharge status: Home / Self Care | Attending: Student | Admitting: Student

## 2023-11-20 DIAGNOSIS — E1165 Type 2 diabetes mellitus with hyperglycemia: Secondary | ICD-10-CM | POA: Diagnosis present

## 2023-11-20 DIAGNOSIS — D631 Anemia in chronic kidney disease: Secondary | ICD-10-CM | POA: Diagnosis not present

## 2023-11-20 DIAGNOSIS — H9190 Unspecified hearing loss, unspecified ear: Secondary | ICD-10-CM | POA: Diagnosis present

## 2023-11-20 DIAGNOSIS — N1330 Unspecified hydronephrosis: Secondary | ICD-10-CM | POA: Diagnosis not present

## 2023-11-20 DIAGNOSIS — N400 Enlarged prostate without lower urinary tract symptoms: Secondary | ICD-10-CM | POA: Diagnosis not present

## 2023-11-20 DIAGNOSIS — E1140 Type 2 diabetes mellitus with diabetic neuropathy, unspecified: Secondary | ICD-10-CM | POA: Diagnosis not present

## 2023-11-20 DIAGNOSIS — N179 Acute kidney failure, unspecified: Principal | ICD-10-CM | POA: Diagnosis present

## 2023-11-20 DIAGNOSIS — E785 Hyperlipidemia, unspecified: Secondary | ICD-10-CM | POA: Diagnosis present

## 2023-11-20 DIAGNOSIS — K219 Gastro-esophageal reflux disease without esophagitis: Secondary | ICD-10-CM | POA: Diagnosis present

## 2023-11-20 DIAGNOSIS — Z79899 Other long term (current) drug therapy: Secondary | ICD-10-CM

## 2023-11-20 DIAGNOSIS — N2889 Other specified disorders of kidney and ureter: Secondary | ICD-10-CM | POA: Diagnosis not present

## 2023-11-20 DIAGNOSIS — E782 Mixed hyperlipidemia: Secondary | ICD-10-CM

## 2023-11-20 DIAGNOSIS — R4182 Altered mental status, unspecified: Secondary | ICD-10-CM | POA: Diagnosis not present

## 2023-11-20 DIAGNOSIS — E538 Deficiency of other specified B group vitamins: Secondary | ICD-10-CM | POA: Diagnosis present

## 2023-11-20 DIAGNOSIS — R42 Dizziness and giddiness: Secondary | ICD-10-CM | POA: Diagnosis not present

## 2023-11-20 DIAGNOSIS — M1990 Unspecified osteoarthritis, unspecified site: Secondary | ICD-10-CM | POA: Diagnosis present

## 2023-11-20 DIAGNOSIS — E872 Acidosis, unspecified: Secondary | ICD-10-CM | POA: Diagnosis present

## 2023-11-20 DIAGNOSIS — I447 Left bundle-branch block, unspecified: Secondary | ICD-10-CM | POA: Diagnosis present

## 2023-11-20 DIAGNOSIS — N184 Chronic kidney disease, stage 4 (severe): Secondary | ICD-10-CM | POA: Diagnosis not present

## 2023-11-20 DIAGNOSIS — E1122 Type 2 diabetes mellitus with diabetic chronic kidney disease: Secondary | ICD-10-CM | POA: Diagnosis not present

## 2023-11-20 DIAGNOSIS — Z96652 Presence of left artificial knee joint: Secondary | ICD-10-CM | POA: Diagnosis present

## 2023-11-20 DIAGNOSIS — D638 Anemia in other chronic diseases classified elsewhere: Secondary | ICD-10-CM | POA: Diagnosis present

## 2023-11-20 DIAGNOSIS — R55 Syncope and collapse: Secondary | ICD-10-CM | POA: Diagnosis not present

## 2023-11-20 DIAGNOSIS — R531 Weakness: Secondary | ICD-10-CM | POA: Diagnosis not present

## 2023-11-20 DIAGNOSIS — E8729 Other acidosis: Secondary | ICD-10-CM | POA: Diagnosis not present

## 2023-11-20 DIAGNOSIS — I440 Atrioventricular block, first degree: Secondary | ICD-10-CM | POA: Diagnosis present

## 2023-11-20 DIAGNOSIS — Z6836 Body mass index (BMI) 36.0-36.9, adult: Secondary | ICD-10-CM | POA: Diagnosis not present

## 2023-11-20 DIAGNOSIS — R112 Nausea with vomiting, unspecified: Secondary | ICD-10-CM | POA: Diagnosis not present

## 2023-11-20 DIAGNOSIS — R519 Headache, unspecified: Secondary | ICD-10-CM | POA: Diagnosis not present

## 2023-11-20 LAB — CBC
HCT: 31 % — ABNORMAL LOW (ref 39.0–52.0)
Hemoglobin: 9.5 g/dL — ABNORMAL LOW (ref 13.0–17.0)
MCH: 29.6 pg (ref 26.0–34.0)
MCHC: 30.6 g/dL (ref 30.0–36.0)
MCV: 96.6 fL (ref 80.0–100.0)
Platelets: 252 K/uL (ref 150–400)
RBC: 3.21 MIL/uL — ABNORMAL LOW (ref 4.22–5.81)
RDW: 16 % — ABNORMAL HIGH (ref 11.5–15.5)
WBC: 9.6 K/uL (ref 4.0–10.5)
nRBC: 0 % (ref 0.0–0.2)

## 2023-11-20 LAB — COMPREHENSIVE METABOLIC PANEL WITH GFR
ALT: 9 U/L (ref 0–44)
AST: 14 U/L — ABNORMAL LOW (ref 15–41)
Albumin: 2.9 g/dL — ABNORMAL LOW (ref 3.5–5.0)
Alkaline Phosphatase: 72 U/L (ref 38–126)
Anion gap: 16 — ABNORMAL HIGH (ref 5–15)
BUN: 41 mg/dL — ABNORMAL HIGH (ref 8–23)
CO2: 19 mmol/L — ABNORMAL LOW (ref 22–32)
Calcium: 9.1 mg/dL (ref 8.9–10.3)
Chloride: 107 mmol/L (ref 98–111)
Creatinine, Ser: 3.28 mg/dL — ABNORMAL HIGH (ref 0.61–1.24)
GFR, Estimated: 17 mL/min — ABNORMAL LOW (ref >60)
Glucose, Bld: 135 mg/dL — ABNORMAL HIGH (ref 70–99)
Potassium: 4 mmol/L (ref 3.5–5.1)
Sodium: 142 mmol/L (ref 135–145)
Total Bilirubin: 0.6 mg/dL (ref 0.0–1.2)
Total Protein: 6.8 g/dL (ref 6.5–8.1)

## 2023-11-20 LAB — LIPASE, BLOOD: Lipase: 36 U/L (ref 11–51)

## 2023-11-20 LAB — TROPONIN I (HIGH SENSITIVITY)
Troponin I (High Sensitivity): 8 ng/L (ref <18)
Troponin I (High Sensitivity): 7 ng/L (ref <18)

## 2023-11-20 LAB — MAGNESIUM: Magnesium: 1.9 mg/dL (ref 1.7–2.4)

## 2023-11-20 LAB — CBG MONITORING, ED: Glucose-Capillary: 121 mg/dL — ABNORMAL HIGH (ref 70–99)

## 2023-11-20 MED ORDER — ACETAMINOPHEN 325 MG PO TABS
650.0000 mg | ORAL_TABLET | Freq: Four times a day (QID) | ORAL | Status: DC | PRN
  Administered 2023-11-20: 650 mg via ORAL
  Filled 2023-11-20: qty 2

## 2023-11-20 MED ORDER — SODIUM CHLORIDE 0.9 % IV BOLUS
500.0000 mL | Freq: Once | INTRAVENOUS | Status: AC
  Administered 2023-11-20: 500 mL via INTRAVENOUS

## 2023-11-20 MED ORDER — ORAL CARE MOUTH RINSE: 15.0000 mL | OROMUCOSAL | Status: DC | PRN

## 2023-11-20 MED ORDER — ONDANSETRON HCL 4 MG/2ML IJ SOLN
4.0000 mg | Freq: Four times a day (QID) | INTRAMUSCULAR | Status: DC | PRN
  Administered 2023-11-21: 4 mg via INTRAVENOUS
  Filled 2023-11-20: qty 2

## 2023-11-20 MED ORDER — LACTATED RINGERS IV SOLN: INTRAVENOUS | Status: AC

## 2023-11-20 MED ORDER — MELATONIN 3 MG PO TABS
3.0000 mg | ORAL_TABLET | Freq: Every evening | ORAL | Status: DC | PRN
  Administered 2023-11-20 – 2023-11-21 (×2): 3 mg via ORAL
  Filled 2023-11-20 (×2): qty 1

## 2023-11-20 MED ORDER — ONDANSETRON HCL 4 MG/2ML IJ SOLN
4.0000 mg | Freq: Once | INTRAMUSCULAR | Status: AC
Start: 2023-11-20 — End: 2023-11-20
  Administered 2023-11-20: 4 mg via INTRAVENOUS
  Filled 2023-11-20: qty 2

## 2023-11-20 MED ORDER — ACETAMINOPHEN 650 MG RE SUPP: 650.0000 mg | Freq: Four times a day (QID) | RECTAL | Status: DC | PRN

## 2023-11-20 NOTE — Plan of Care (Signed)
   Problem: Education: Goal: Knowledge of General Education information will improve Description Including pain rating scale, medication(s)/side effects and non-pharmacologic comfort measures Outcome: Progressing

## 2023-11-20 NOTE — ED Notes (Signed)
 PT was able to drink water on his own.

## 2023-11-20 NOTE — ED Provider Notes (Signed)
 Emergency Department Provider Note   I have reviewed the triage vital signs and the nursing notes.   HISTORY  Chief Complaint Loss of Consciousness   HPI Angel Norman is a 89 y.o. male with past history reviewed below presents to the emergency department for evaluation of syncope.  He donated blood today and passed out shortly afterward.  Since that time has had a persistent, severe headache with nausea and vomiting.  He has been fatigued.  He has a Angel Norman history of donating blood although has not donated in the last year.  He ate a late breakfast in preparation for donation.  He was feeling relatively well prior to donation.  Staff noticed that he passed out while in the chair and so did not fall or hit his head.  They offered him cookies but he was unable to keep anything down due to profuse vomiting.  He denies any active chest discomfort, shortness of breath, abdominal pain.  No vision changes.  No numbness or weakness.  Past Medical History:  Diagnosis Date   Arthritis    GERD (gastroesophageal reflux disease)    Hyperlipidemia    Rib fractures 12/06/2014    Review of Systems  Constitutional: No fever/chills Cardiovascular: Denies chest pain. Positive syncope.  Respiratory: Denies shortness of breath. Gastrointestinal: No abdominal pain. Positive vomiting.  Musculoskeletal: Negative for back pain. Skin: Negative for rash. Neurological: Positive HA.   ____________________________________________   PHYSICAL EXAM:  VITAL SIGNS: ED Triage Vitals  Encounter Vitals Group     BP 11/20/23 1815 123/69     Pulse Rate 11/20/23 1815 73     Resp 11/20/23 1815 20     Temp 11/20/23 1816 (!) 97.5 F (36.4 C)     Temp Source 11/20/23 1816 Oral     SpO2 11/20/23 1815 93 %     Weight 11/20/23 1813 290 lb (131.5 kg)     Height 11/20/23 1813 6' 3 (1.905 m)   Constitutional: Alert and oriented. Well appearing and in no acute distress. Eyes: Conjunctivae are normal. PERRL (2mm).   Head: Atraumatic. Nose: No congestion/rhinnorhea. Mouth/Throat: Mucous membranes are moist.   Neck: No stridor.   Cardiovascular: Normal rate, regular rhythm. Good peripheral circulation. Grossly normal heart sounds.   Respiratory: Normal respiratory effort.  No retractions. Lungs CTAB. Gastrointestinal: Soft and nontender. No distention.  Musculoskeletal: No gross deformities of extremities. Neurologic:  Normal speech and language. No gross focal neurologic deficits are appreciated.  Skin:  Skin is warm, dry and intact. No rash noted.   ____________________________________________   LABS (all labs ordered are listed, but only abnormal results are displayed)  Labs Reviewed  COMPREHENSIVE METABOLIC PANEL WITH GFR - Abnormal; Notable for the following components:      Result Value   CO2 19 (*)    Glucose, Bld 135 (*)    BUN 41 (*)    Creatinine, Ser 3.28 (*)    Albumin 2.9 (*)    AST 14 (*)    GFR, Estimated 17 (*)    Anion gap 16 (*)    All other components within normal limits  CBC - Abnormal; Notable for the following components:   RBC 3.21 (*)    Hemoglobin 9.5 (*)    HCT 31.0 (*)    RDW 16.0 (*)    All other components within normal limits  CBG MONITORING, ED - Abnormal; Notable for the following components:   Glucose-Capillary 121 (*)    All other components within  normal limits  LIPASE, BLOOD  MAGNESIUM   URINALYSIS, ROUTINE W REFLEX MICROSCOPIC  CBC WITH DIFFERENTIAL/PLATELET  COMPREHENSIVE METABOLIC PANEL WITH GFR  MAGNESIUM   VITAMIN B12  TSH  CK  TROPONIN I (HIGH SENSITIVITY)  TROPONIN I (HIGH SENSITIVITY)   ____________________________________________  EKG   EKG Interpretation Date/Time:  Thursday November 20 2023 18:16:03 EDT Ventricular Rate:  75 PR Interval:  224 QRS Duration:  141 QT Interval:  437 QTC Calculation: 489 R Axis:   -19  Text Interpretation: Sinus rhythm Prolonged PR interval Left bundle branch block Similar to prior  Confirmed by Darra Chew [phone]) on 11/20/2023 6:22:31 PM        ____________________________________________  RADIOLOGY  CT Head Wo Contrast Result Date: 11/20/2023 EXAM: CT HEAD WITHOUT CONTRAST 11/20/2023 06:46:37 PM TECHNIQUE: CT of the head was performed without the administration of intravenous contrast. Automated exposure control, iterative reconstruction, and/or weight based adjustment of the mA/kV was utilized to reduce the radiation dose to as low as reasonably achievable. COMPARISON: 11/28/2020 CLINICAL HISTORY: Mental status change, unknown cause. Patient was donating blood and passed out, patient did not fall; No known hx of stroke. FINDINGS: BRAIN AND VENTRICLES: No acute hemorrhage. No evidence of acute infarct. No hydrocephalus. No extra-axial collection. No mass effect or midline shift. Nonspecific hypoattenuation in the periventricular and subcortical white matter, most likely representing chronic microvascular ischemic changes. ORBITS: No acute abnormality. SINUSES: No acute abnormality. SOFT TISSUES AND SKULL: No acute soft tissue abnormality. No skull fracture. Bilateral lens replacement. VASCULATURE: Atherosclerosis of the carotid siphons. IMPRESSION: 1. No acute intracranial abnormality. 2. Findings consistent with chronic microvascular ischemic disease. Electronically signed by: Donnice Mania MD 11/20/2023 07:02 PM EDT RP Workstation: [id number]    ____________________________________________   PROCEDURES  Procedure(s) performed:   Procedures  None  ____________________________________________   INITIAL IMPRESSION / ASSESSMENT AND PLAN / ED COURSE  Pertinent labs & imaging results that were available during my care of the patient were reviewed by me and considered in my medical decision making (see chart for details).   This patient is Presenting for Evaluation of syncope/HA, which does require a range of treatment options, and is a complaint that involves a high  risk of morbidity and mortality.  The Differential Diagnoses includes but is not exclusive to subarachnoid hemorrhage, meningitis, encephalitis, previous head trauma, cavernous venous thrombosis, muscle tension headache, glaucoma, temporal arteritis, migraine or migraine equivalent, etc.   Critical Interventions-    Medications  lactated ringers  infusion ( Intravenous New Bag/Given 11/20/23 2242)  acetaminophen  (TYLENOL ) tablet 650 mg (650 mg Oral Given 11/20/23 2248)    Or  acetaminophen  (TYLENOL ) suppository 650 mg ( Rectal See Alternative 11/20/23 2248)  melatonin tablet 3 mg (3 mg Oral Given 11/20/23 2249)  ondansetron  (ZOFRAN ) injection 4 mg (has no administration in time range)  sodium chloride  0.9 % bolus 500 mL (0 mLs Intravenous Stopped 11/20/23 2022)  ondansetron  (ZOFRAN ) injection 4 mg (4 mg Intravenous Given 11/20/23 1826)    Reassessment after intervention: symptoms improved.    I did obtain Additional Historical Information from daughter at bedside.   Clinical Laboratory Tests Ordered, included CMP with elevated Creatinine to 3.28. K is normal. Hb low at 9.5. Troponin negative.   Radiologic Tests Ordered, included CT head. I independently interpreted the images and agree with radiology interpretation.   Cardiac Monitor Tracing which shows NSR.    Social Determinants of Health Risk patient is a non-smoker.   Consult complete with [facility]. Plan for admit.  Medical Decision Making: Summary:  The patient presents emergency department after syncope while donating blood.  He has a lingering severe headache with vomiting.  No neurodeficits.  No chest discomfort or other obvious anginal equivalents.  Abdomen is diffusely soft and nontender.  Plan for IV fluids, Zofran , CT head and reassess.   Reevaluation with update and discussion with patient and family. Plan for admit for AKI. He is in agreement.   Patient's presentation is most consistent with acute presentation with potential  threat to life or bodily function.   Disposition: discharge  ____________________________________________  FINAL CLINICAL IMPRESSION(S) / ED DIAGNOSES  Final diagnoses:  Syncope and collapse  AKI (acute kidney injury) (HCC)    Note:  This document was prepared using Dragon voice recognition software and may include unintentional dictation errors.  Fonda Law, MD, [facility] Emergency Medicine    Stachia Slutsky, Fonda MATSU, MD 11/20/23 [phone]

## 2023-11-20 NOTE — Progress Notes (Signed)
 New Admission Note:  Arrival Method: Stretcher  Mental Orientation: Alert and oriented x 4. V Telemetry: Box 7  Assessment: Completed Skin: Warm and dry IV: NSL Pain: 6/10 generalized Tubes: N/A Safety Measures: Safety Fall Prevention Plan initiated.  Admission: Completed 5 M  Orientation: Patient has been orientated to the room, unit and the staff. Welcome booklet given.  Family: Dtr.   Orders have been reviewed and implemented. Will continue to monitor the patient. Call light has been placed within reach and bed alarm has been activated.   Durwood Dee BSN, RN  Phone Number: [phone]

## 2023-11-20 NOTE — ED Triage Notes (Addendum)
 Pt was donating blood and went to use the bathroom, came back pale and dizzy and had witnessed syncopal event. C/O n/v/dizziness/weakness/headache. Stroke screen negative per EMS. Axox3.

## 2023-11-20 NOTE — H&P (Signed)
 History and Physical      Dimitry Holsworth [facility] FMW:[id number] DOB: 1932/09/10 DOA: 11/20/2023; DOS: 11/20/2023  PCP: Loreli Kins, MD *** Patient coming from: home ***  I have personally briefly reviewed patient's old medical records in [facility]  Chief Complaint: ***  HPI: Angel Norman is a 89 y.o. male with medical history significant for *** who is admitted to [facility] on 11/20/2023 with *** after presenting from home*** to [facility] ED complaining of ***.    ***       ***   ED Course:  Vital signs in the ED were notable for the following: ***  Labs were notable for the following: ***  Per my interpretation, EKG in ED demonstrated the following:  ***  Imaging in the ED, per corresponding formal radiology read, was notable for the following:  ***  While in the ED, the following were administered: ***  Subsequently, the patient was admitted  ***  ***red    Review of Systems: As per HPI otherwise 10 point review of systems negative.   Past Medical History:  Diagnosis Date   Arthritis    GERD (gastroesophageal reflux disease)    Hyperlipidemia    Rib fractures 12/06/2014    Past Surgical History:  Procedure Laterality Date   ESOPHAGOGASTRODUODENOSCOPY (EGD) WITH PROPOFOL  N/A 05/08/2018   Procedure: ESOPHAGOGASTRODUODENOSCOPY (EGD) WITH PROPOFOL ;  Surgeon: Golda Claudis PENNER, MD;  Location: [facility] ENDO SUITE;  Service: Endoscopy;  Laterality: N/A;   goiter removal     INCISION AND DRAINAGE Left 04/24/2021   Procedure: tenosynovectomy, left ankle joint aspiration;  Surgeon: Margrette Taft BRAVO, MD;  Location: [facility] ORS;  Service: Orthopedics;  Laterality: Left;   ORIF ANKLE FRACTURE Left 09/13/2020   Procedure: OPEN REDUCTION INTERNAL FIXATION (ORIF) LEFT ANKLE FRACTURE;  Surgeon: Margrette Taft BRAVO, MD;  Location: [facility] ORS;  Service: Orthopedics;  Laterality: Left;   TOTAL KNEE ARTHROPLASTY Left 08/18/2012   Dr Harden   TOTAL KNEE ARTHROPLASTY Left 08/19/2012    Procedure: TOTAL KNEE ARTHROPLASTY;  Surgeon: Jerona LULLA Harden, MD;  Location: [facility] OR;  Service: Orthopedics;  Laterality: Left;  Left Total Knee Arthroplasty    Social History:  reports that he has never smoked. He has never used smokeless tobacco. He reports that he does not drink alcohol and does not use drugs.   No Known Allergies  Family History  Family history unknown: Yes    Family history reviewed and not pertinent ***   Prior to Admission medications   Medication Sig Start Date End Date Taking? Authorizing Provider  acetaminophen  (TYLENOL ) 325 MG tablet Take 2 tablets (650 mg total) by mouth every 6 (six) hours. 09/15/20  Yes Johnson, Clanford L, MD  albuterol  (VENTOLIN  HFA) 108 (90 Base) MCG/ACT inhaler Inhale 1 puff into the lungs in the morning and at bedtime. 04/21/23  Yes [provider]  ferrous sulfate  325 (65 FE) MG EC tablet Take 325 mg by mouth daily with breakfast.   Yes [provider]  finasteride  (PROSCAR ) 5 MG tablet Take 1 tablet (5 mg total) by mouth daily. 12/23/22  Yes Nieves Cough, MD  Multiple Vitamin (MULTIVITAMIN) tablet Take 1 tablet by mouth daily.   Yes [provider]  pantoprazole  (PROTONIX ) 40 MG tablet Take 40 mg by mouth 2 (two) times daily. 12/21/22  Yes [provider]  polyethylene glycol (MIRALAX  / GLYCOLAX ) 17 g packet Take 17 g by mouth daily as needed for mild constipation. 04/26/21  Yes Maree,  Pratik D, DO  pravastatin  (PRAVACHOL ) 80 MG tablet Take 80 mg by mouth daily.   Yes [provider]  RYBELSUS 7 MG TABS Take 7 mg by mouth daily. 09/18/23  Yes [provider]  tamsulosin  (FLOMAX ) 0.4 MG CAPS capsule Take 0.4 mg by mouth daily.   Yes [provider]     Objective    Physical Exam: Vitals:   11/20/23 1813 11/20/23 1815 11/20/23 1816  BP:  123/69   Pulse:  73   Resp:  20   Temp:   (!) 97.5 F (36.4 C)  TempSrc:   Oral  SpO2:  93%   Weight: 131.5 kg    Height: 6'  3 (1.905 m)      General: appears to be stated age; alert, oriented Skin: warm, dry, no rash Head:  AT/[place]:  Oral mucosa membranes appear moist, normal dentition Neck: supple; trachea midline Heart:  RRR; did not appreciate any M/R/G Lungs: CTAB, did not appreciate any wheezes, rales, or rhonchi Abdomen: + BS; soft, ND, NT Vascular: 2+ pedal pulses b/l; 2+ radial pulses b/l Extremities: no peripheral edema, no muscle wasting Neuro: strength and sensation intact in upper and lower extremities b/l ***   *** Neuro: 5/5 strength of the proximal and distal flexors and extensors of the upper and lower extremities bilaterally; sensation intact in upper and lower extremities b/l; cranial nerves II through XII grossly intact; no pronator drift; no evidence suggestive of slurred speech, dysarthria, or facial droop; Normal muscle tone. No tremors.  *** Neuro: In the setting of the patient's current mental status and associated inability to follow instructions, unable to perform full neurologic exam at this time.  As such, assessment of strength, sensation, and cranial nerves is limited at this time. Patient noted to spontaneously move all 4 extremities. No tremors.  ***    Labs on Admission: I have personally reviewed following labs and imaging studies  CBC: Recent Labs  Lab 11/20/23 1830  WBC 9.6  HGB 9.5*  HCT 31.0*  MCV 96.6  PLT 252   Basic Metabolic Panel: Recent Labs  Lab 11/20/23 1830  NA 142  K 4.0  CL 107  CO2 19*  GLUCOSE 135*  BUN 41*  CREATININE 3.28*  CALCIUM 9.1   GFR: Estimated Creatinine Clearance: 22.7 mL/min (A) (by C-G formula based on SCr of 3.28 mg/dL (H)). Liver Function Tests: Recent Labs  Lab 11/20/23 1830  AST 14*  ALT 9  ALKPHOS 72  BILITOT 0.6  PROT 6.8  ALBUMIN 2.9*   No results for input(s): LIPASE, AMYLASE in the last 168 hours. No results for input(s): AMMONIA in the last 168 hours. Coagulation Profile: No results for  input(s): INR, PROTIME in the last 168 hours. Cardiac Enzymes: No results for input(s): CKTOTAL, CKMB, CKMBINDEX, TROPONINI in the last 168 hours. BNP (last 3 results) No results for input(s): PROBNP in the last 8760 hours. HbA1C: No results for input(s): HGBA1C in the last 72 hours. CBG: Recent Labs  Lab 11/20/23 1818  GLUCAP 121*   Lipid Profile: No results for input(s): CHOL, HDL, LDLCALC, TRIG, CHOLHDL, LDLDIRECT in the last 72 hours. Thyroid  Function Tests: No results for input(s): TSH, T4TOTAL, FREET4, T3FREE, THYROIDAB in the last 72 hours. Anemia Panel: No results for input(s): VITAMINB12, FOLATE, FERRITIN, TIBC, IRON, RETICCTPCT in the last 72 hours. Urine analysis:    Component Value Date/Time   COLORURINE YELLOW 04/22/2021 2050   APPEARANCEUR Clear 05/05/2023 1122   LABSPEC 1.022  04/22/2021 2050   PHURINE 5.0 04/22/2021 2050   GLUCOSEU Negative 05/05/2023 1122   HGBUR SMALL (A) 04/22/2021 2050   BILIRUBINUR Negative 05/05/2023 1122   KETONESUR NEGATIVE 04/22/2021 2050   PROTEINUR Negative 05/05/2023 1122   PROTEINUR NEGATIVE 04/22/2021 2050   NITRITE Negative 05/05/2023 1122   NITRITE NEGATIVE 04/22/2021 2050   LEUKOCYTESUR 3+ (A) 05/05/2023 1122   LEUKOCYTESUR LARGE (A) 04/22/2021 2050    Radiological Exams on Admission: CT Head Wo Contrast Result Date: 11/20/2023 EXAM: CT HEAD WITHOUT CONTRAST 11/20/2023 06:46:37 PM TECHNIQUE: CT of the head was performed without the administration of intravenous contrast. Automated exposure control, iterative reconstruction, and/or weight based adjustment of the mA/kV was utilized to reduce the radiation dose to as low as reasonably achievable. COMPARISON: 11/28/2020 CLINICAL HISTORY: Mental status change, unknown cause. Patient was donating blood and passed out, patient did not fall; No known hx of stroke. FINDINGS: BRAIN AND VENTRICLES: No acute hemorrhage. No evidence of acute  infarct. No hydrocephalus. No extra-axial collection. No mass effect or midline shift. Nonspecific hypoattenuation in the periventricular and subcortical white matter, most likely representing chronic microvascular ischemic changes. ORBITS: No acute abnormality. SINUSES: No acute abnormality. SOFT TISSUES AND SKULL: No acute soft tissue abnormality. No skull fracture. Bilateral lens replacement. VASCULATURE: Atherosclerosis of the carotid siphons. IMPRESSION: 1. No acute intracranial abnormality. 2. Findings consistent with chronic microvascular ischemic disease. Electronically signed by: Donnice Mania MD 11/20/2023 07:02 PM EDT RP Workstation: [id number]      Assessment/Plan   Principal Problem:   AKI (acute kidney injury) (HCC)   ***            ***                  ***                   ***                  ***                  ***                  ***                   ***                  ***                  ***                  ***                  ***                 ***                ***  DVT prophylaxis: SCD's ***  Code Status: Full code*** Family Communication: none*** Disposition Plan: Per Rounding Team Consults called: none***;  Admission status: ***     I SPENT GREATER THAN 75 *** MINUTES IN CLINICAL CARE TIME/MEDICAL DECISION-MAKING IN COMPLETING THIS ADMISSION.      Eva NOVAK Shepard Keltz DO [facility]  From 7PM - 7AM   11/20/2023, 9:42 PM   ***

## 2023-11-21 ENCOUNTER — Observation Stay (HOSPITAL_COMMUNITY)

## 2023-11-21 ENCOUNTER — Encounter (HOSPITAL_COMMUNITY): Payer: Self-pay | Admitting: Internal Medicine

## 2023-11-21 DIAGNOSIS — R531 Weakness: Secondary | ICD-10-CM | POA: Diagnosis not present

## 2023-11-21 DIAGNOSIS — N400 Enlarged prostate without lower urinary tract symptoms: Secondary | ICD-10-CM | POA: Diagnosis present

## 2023-11-21 DIAGNOSIS — N2889 Other specified disorders of kidney and ureter: Secondary | ICD-10-CM | POA: Diagnosis not present

## 2023-11-21 DIAGNOSIS — Z79899 Other long term (current) drug therapy: Secondary | ICD-10-CM | POA: Diagnosis not present

## 2023-11-21 DIAGNOSIS — M1990 Unspecified osteoarthritis, unspecified site: Secondary | ICD-10-CM | POA: Diagnosis present

## 2023-11-21 DIAGNOSIS — E785 Hyperlipidemia, unspecified: Secondary | ICD-10-CM | POA: Diagnosis present

## 2023-11-21 DIAGNOSIS — E872 Acidosis, unspecified: Secondary | ICD-10-CM | POA: Diagnosis present

## 2023-11-21 DIAGNOSIS — D638 Anemia in other chronic diseases classified elsewhere: Secondary | ICD-10-CM | POA: Diagnosis not present

## 2023-11-21 DIAGNOSIS — I447 Left bundle-branch block, unspecified: Secondary | ICD-10-CM | POA: Diagnosis present

## 2023-11-21 DIAGNOSIS — N179 Acute kidney failure, unspecified: Secondary | ICD-10-CM | POA: Diagnosis not present

## 2023-11-21 DIAGNOSIS — E1140 Type 2 diabetes mellitus with diabetic neuropathy, unspecified: Secondary | ICD-10-CM | POA: Diagnosis present

## 2023-11-21 DIAGNOSIS — N1330 Unspecified hydronephrosis: Secondary | ICD-10-CM | POA: Diagnosis not present

## 2023-11-21 DIAGNOSIS — Z96652 Presence of left artificial knee joint: Secondary | ICD-10-CM | POA: Diagnosis present

## 2023-11-21 DIAGNOSIS — E1165 Type 2 diabetes mellitus with hyperglycemia: Secondary | ICD-10-CM | POA: Diagnosis present

## 2023-11-21 DIAGNOSIS — E1122 Type 2 diabetes mellitus with diabetic chronic kidney disease: Secondary | ICD-10-CM | POA: Diagnosis present

## 2023-11-21 DIAGNOSIS — E538 Deficiency of other specified B group vitamins: Secondary | ICD-10-CM | POA: Insufficient documentation

## 2023-11-21 DIAGNOSIS — D631 Anemia in chronic kidney disease: Secondary | ICD-10-CM | POA: Diagnosis present

## 2023-11-21 DIAGNOSIS — R55 Syncope and collapse: Secondary | ICD-10-CM | POA: Diagnosis present

## 2023-11-21 DIAGNOSIS — K219 Gastro-esophageal reflux disease without esophagitis: Secondary | ICD-10-CM | POA: Diagnosis present

## 2023-11-21 DIAGNOSIS — I440 Atrioventricular block, first degree: Secondary | ICD-10-CM | POA: Diagnosis present

## 2023-11-21 DIAGNOSIS — Z6836 Body mass index (BMI) 36.0-36.9, adult: Secondary | ICD-10-CM | POA: Diagnosis not present

## 2023-11-21 DIAGNOSIS — H9190 Unspecified hearing loss, unspecified ear: Secondary | ICD-10-CM | POA: Diagnosis present

## 2023-11-21 DIAGNOSIS — N184 Chronic kidney disease, stage 4 (severe): Secondary | ICD-10-CM | POA: Diagnosis present

## 2023-11-21 DIAGNOSIS — E8729 Other acidosis: Secondary | ICD-10-CM | POA: Diagnosis present

## 2023-11-21 LAB — IRON AND TIBC
Iron: 31 ug/dL — ABNORMAL LOW (ref 45–182)
Saturation Ratios: 13 % — ABNORMAL LOW (ref 17.9–39.5)
TIBC: 242 ug/dL — ABNORMAL LOW (ref 250–450)
UIBC: 211 ug/dL

## 2023-11-21 LAB — CBC WITH DIFFERENTIAL/PLATELET
Abs Immature Granulocytes: 0.06 K/uL (ref 0.00–0.07)
Basophils Absolute: 0 K/uL (ref 0.0–0.1)
Basophils Relative: 0 %
Eosinophils Absolute: 0 K/uL (ref 0.0–0.5)
Eosinophils Relative: 0 %
HCT: 30.4 % — ABNORMAL LOW (ref 39.0–52.0)
Hemoglobin: 9.4 g/dL — ABNORMAL LOW (ref 13.0–17.0)
Immature Granulocytes: 1 %
Lymphocytes Relative: 6 %
Lymphs Abs: 0.6 K/uL — ABNORMAL LOW (ref 0.7–4.0)
MCH: 29.3 pg (ref 26.0–34.0)
MCHC: 30.9 g/dL (ref 30.0–36.0)
MCV: 94.7 fL (ref 80.0–100.0)
Monocytes Absolute: 0.3 K/uL (ref 0.1–1.0)
Monocytes Relative: 3 %
Neutro Abs: 8.8 K/uL — ABNORMAL HIGH (ref 1.7–7.7)
Neutrophils Relative %: 90 %
Platelets: 230 K/uL (ref 150–400)
RBC: 3.21 MIL/uL — ABNORMAL LOW (ref 4.22–5.81)
RDW: 15.8 % — ABNORMAL HIGH (ref 11.5–15.5)
WBC: 9.7 K/uL (ref 4.0–10.5)
nRBC: 0 % (ref 0.0–0.2)

## 2023-11-21 LAB — ECHOCARDIOGRAM COMPLETE
Area-P 1/2: 2.99 cm2
Height: 75 in
S' Lateral: 4.7 cm
Weight: 4663.17 [oz_av]

## 2023-11-21 LAB — TSH: TSH: 1.517 u[IU]/mL (ref 0.350–4.500)

## 2023-11-21 LAB — HEMOGLOBIN A1C
Hgb A1c MFr Bld: 6.4 % — ABNORMAL HIGH (ref 4.8–5.6)
Mean Plasma Glucose: 136.98 mg/dL

## 2023-11-21 LAB — COMPREHENSIVE METABOLIC PANEL WITH GFR
ALT: 11 U/L (ref 0–44)
AST: 13 U/L — ABNORMAL LOW (ref 15–41)
Albumin: 2.9 g/dL — ABNORMAL LOW (ref 3.5–5.0)
Alkaline Phosphatase: 74 U/L (ref 38–126)
Anion gap: 15 (ref 5–15)
BUN: 41 mg/dL — ABNORMAL HIGH (ref 8–23)
CO2: 21 mmol/L — ABNORMAL LOW (ref 22–32)
Calcium: 9 mg/dL (ref 8.9–10.3)
Chloride: 104 mmol/L (ref 98–111)
Creatinine, Ser: 3.18 mg/dL — ABNORMAL HIGH (ref 0.61–1.24)
GFR, Estimated: 18 mL/min — ABNORMAL LOW (ref >60)
Glucose, Bld: 200 mg/dL — ABNORMAL HIGH (ref 70–99)
Potassium: 4.5 mmol/L (ref 3.5–5.1)
Sodium: 140 mmol/L (ref 135–145)
Total Bilirubin: 0.5 mg/dL (ref 0.0–1.2)
Total Protein: 6.9 g/dL (ref 6.5–8.1)

## 2023-11-21 LAB — RETICULOCYTES
Immature Retic Fract: 9.8 % (ref 2.3–15.9)
RBC.: 3.26 MIL/uL — ABNORMAL LOW (ref 4.22–5.81)
Retic Count, Absolute: 36.2 K/uL (ref 19.0–186.0)
Retic Ct Pct: 1.1 % (ref 0.4–3.1)

## 2023-11-21 LAB — URINALYSIS, ROUTINE W REFLEX MICROSCOPIC
Bilirubin Urine: NEGATIVE
Glucose, UA: NEGATIVE mg/dL
Ketones, ur: NEGATIVE mg/dL
Nitrite: NEGATIVE
Protein, ur: NEGATIVE mg/dL
Specific Gravity, Urine: 1.009 (ref 1.005–1.030)
WBC, UA: >50 WBC/hpf (ref 0–5)
pH: 6 (ref 5.0–8.0)

## 2023-11-21 LAB — GLUCOSE, CAPILLARY
Glucose-Capillary: 126 mg/dL — ABNORMAL HIGH (ref 70–99)
Glucose-Capillary: 99 mg/dL (ref 70–99)

## 2023-11-21 LAB — FOLATE: Folate: 10.9 ng/mL (ref >5.9)

## 2023-11-21 LAB — VITAMIN B12: Vitamin B-12: <150 pg/mL — ABNORMAL LOW (ref 180–914)

## 2023-11-21 LAB — MAGNESIUM: Magnesium: 1.9 mg/dL (ref 1.7–2.4)

## 2023-11-21 LAB — FERRITIN: Ferritin: 102 ng/mL (ref 24–336)

## 2023-11-21 LAB — CK: Total CK: 69 U/L (ref 49–397)

## 2023-11-21 LAB — SODIUM, URINE, RANDOM: Sodium, Ur: 68 mmol/L

## 2023-11-21 LAB — CREATININE, URINE, RANDOM: Creatinine, Urine: 82 mg/dL

## 2023-11-21 MED ORDER — INSULIN ASPART 100 UNIT/ML IJ SOLN
0.0000 [IU] | Freq: Three times a day (TID) | INTRAMUSCULAR | Status: DC
  Administered 2023-11-21: 1 [IU] via SUBCUTANEOUS

## 2023-11-21 MED ORDER — TAMSULOSIN HCL 0.4 MG PO CAPS
0.4000 mg | ORAL_CAPSULE | Freq: Every day | ORAL | Status: DC
  Administered 2023-11-21 – 2023-11-22 (×2): 0.4 mg via ORAL
  Filled 2023-11-21 (×2): qty 1

## 2023-11-21 MED ORDER — INSULIN ASPART 100 UNIT/ML IJ SOLN: 0.0000 [IU] | Freq: Every day | INTRAMUSCULAR | Status: DC

## 2023-11-21 MED ORDER — PERFLUTREN LIPID MICROSPHERE
1.0000 mL | INTRAVENOUS | Status: AC | PRN
  Administered 2023-11-21: 4 mL via INTRAVENOUS

## 2023-11-21 MED ORDER — HEPARIN SODIUM (PORCINE) 5000 UNIT/ML IJ SOLN
5000.0000 [IU] | Freq: Three times a day (TID) | INTRAMUSCULAR | Status: DC
  Administered 2023-11-21 (×2): 5000 [IU] via SUBCUTANEOUS
  Filled 2023-11-21 (×2): qty 1

## 2023-11-21 MED ORDER — PRAVASTATIN SODIUM 40 MG PO TABS
80.0000 mg | ORAL_TABLET | Freq: Every day | ORAL | Status: DC
  Administered 2023-11-21 – 2023-11-22 (×2): 80 mg via ORAL
  Filled 2023-11-21 (×2): qty 2

## 2023-11-21 MED ORDER — CYANOCOBALAMIN 1000 MCG/ML IJ SOLN
1000.0000 ug | Freq: Every day | INTRAMUSCULAR | Status: AC
  Administered 2023-11-21 – 2023-11-22 (×2): 1000 ug via INTRAMUSCULAR
  Filled 2023-11-21 (×2): qty 1

## 2023-11-21 MED ORDER — FINASTERIDE 5 MG PO TABS
5.0000 mg | ORAL_TABLET | Freq: Every day | ORAL | Status: DC
  Administered 2023-11-21 – 2023-11-22 (×2): 5 mg via ORAL
  Filled 2023-11-21 (×2): qty 1

## 2023-11-21 MED ORDER — PANTOPRAZOLE SODIUM 40 MG IV SOLR
40.0000 mg | Freq: Every day | INTRAVENOUS | Status: DC
  Administered 2023-11-21 – 2023-11-22 (×2): 40 mg via INTRAVENOUS
  Filled 2023-11-21 (×2): qty 10

## 2023-11-21 MED ORDER — PROCHLORPERAZINE EDISYLATE 10 MG/2ML IJ SOLN: 10.0000 mg | Freq: Four times a day (QID) | INTRAMUSCULAR | Status: DC | PRN

## 2023-11-21 MED ORDER — FERROUS SULFATE 325 (65 FE) MG PO TABS
325.0000 mg | ORAL_TABLET | Freq: Every day | ORAL | Status: DC
  Administered 2023-11-21 – 2023-11-22 (×2): 325 mg via ORAL
  Filled 2023-11-21 (×2): qty 1

## 2023-11-21 MED ORDER — VITAMIN B-12 1000 MCG PO TABS: 1000.0000 ug | ORAL_TABLET | Freq: Every day | ORAL | Status: DC

## 2023-11-21 NOTE — Progress Notes (Signed)
 PROGRESS NOTE  Angel Norman [facility] FMW:[id number] DOB: [birth year]   PCP: Loreli Kins, MD  Patient is from: Home.  Uses cane for ambulation.  DOA: 11/20/2023 LOS: 0  Chief complaints Chief Complaint  Patient presents with   Loss of Consciousness     Brief Narrative / Interim history: 89 year old M with PMH of CKD-3, DM-2 with neuropathy, osteoarthritis, BPH,  HLD, anemia, obesity and impaired hearing brought to ED after he had a syncopal episode while donating blood, and admitted for AKI and syncope.  Had associated nausea, vomiting and weakness.  Patient reports donating blood regularly for many years.  In ED, stable vitals. Cr 3.28 (unknown baseline).  BUN 41.  Bicarb 19.  AG 16.  Serial troponin negative.  EKG with first-degree AV block.  BNP normal.  Hgb 9.5 (unknown baseline).  UA with pyuria.  CT head without acute finding.  Started on IV fluids and admitted for further care.  Creatinine remains elevated at 3.18.  Remains on IV fluid  Subjective: Seen and examined earlier this morning.  No major events overnight or this morning.  No complaints.  He denies chest pain, dizziness, palpitation, shortness of breath, nausea, vomiting, abdominal pain or UTI symptoms.  Feels like he is making good amount of urine.  Reports having bowel movements.  Denies melena or hematochezia.    Objective: Vitals:   11/20/23 2200 11/20/23 2246 11/21/23 0429 11/21/23 0826  BP:  (!) 142/76 137/79 139/76  Pulse:  71 77 79  Resp:  20 20 18   Temp: 97.6 F (36.4 C) 98 F (36.7 C) 97.7 F (36.5 C) (!) 97.5 F (36.4 C)  TempSrc: Oral Oral Oral Oral  SpO2:  95% 96%   Weight:  132.2 kg    Height:  6' 3 (1.905 m)      Examination:  GENERAL: No apparent distress.  Nontoxic. HEENT: MMM.  Vision grossly intact.  Hard of hearing. NECK: Supple.  No apparent JVD.  RESP:  No IWOB.  Fair aeration bilaterally. CVS:  RRR. Heart sounds normal.  ABD/GI/GU: BS+. Abd soft, NTND.  MSK/EXT:  Moves extremities. No  apparent deformity. No edema.  SKIN: no apparent skin lesion or wound NEURO: AA.  Oriented appropriately.  No apparent focal neuro deficit. PSYCH: Calm. Normal affect.   Consultants:  None  Procedures: None  Microbiology summarized: None  Assessment and plan: AKI on CKD-3A: Carries history of CKD-3 but unknown baseline.  Unclear if this is acute or progressive CKD.  Patient reports taking pain medication but not sure what it is.  His daughter manages his medications.  Creatinine remains elevated. Recent Labs    11/20/23 1830 11/21/23 0004  BUN 41* 41*  CREATININE 3.28* 3.18*  - Continue IV fluid - Renal ultrasound - Strict intake and output - Continue monitoring  Syncopal episode: Reportedly passed out while donating blood.  He is a lifetime donor.  His hemoglobin is 9.5 and has been stable since then.  EKG sinus rhythm with first-degree AVB and chronic LBBB.  Serial troponin and BNP normal. - Check orthostatic vitals and echocardiogram - PT/OT eval  Anemia of chronic disease/vitamin B12 deficiency:   Patient is a blood donor.  Unknown baseline.  H&H stable.Anemia panel with very low vitamin B12 likely from chronic PPI use. Recent Labs    11/20/23 1830 11/21/23 0004  HGB 9.5* 9.4*  - Monitor H&H - Vitamin B12 injection 1000 mcg daily x 2 followed by p.o.   NIDDM-2 with hyperglycemia: BMP glucose elevated  to 200.  A1c 5.7% in 04/2021.  Seems to be on Rybelsus outpatient. Recent Labs  Lab 11/20/23 1818  GLUCAP 121*  - Start SSI-sensitive - Check hemoglobin A1c  Hyperlipidemia - Continue home pravastatin   GERD - Continue home PPI but recommend weaning off if possible  BPH - Continue home Flomax  and Proscar   Hard of hearing  Morbid obesity: Elevated BMI with diabetes. Body mass index is 36.43 kg/m.          DVT prophylaxis:  heparin  injection 5,000 Units Start: 11/21/23 1400 SCDs Start: 11/20/23 2132  Code Status: Full code Family Communication:  None at bedside Level of care: Telemetry Medical Status is: Observation The patient will require care spanning > 2 midnights and should be moved to inpatient because: AKI   Final disposition: Likely home once medically stable   55 minutes with more than 50% spent in reviewing records, counseling patient/family and coordinating care.   Sch Meds:  Scheduled Meds:  cyanocobalamin   1,000 mcg Intramuscular Daily   Followed by   NOREEN ON 11/23/2023] vitamin B-12  1,000 mcg Oral Daily   ferrous sulfate   325 mg Oral Q breakfast   finasteride   5 mg Oral Daily   heparin   5,000 Units Subcutaneous Q8H   insulin  aspart  0-5 Units Subcutaneous QHS   insulin  aspart  0-9 Units Subcutaneous TID WC   pantoprazole  (PROTONIX ) IV  40 mg Intravenous Daily   pravastatin   80 mg Oral Daily   tamsulosin   0.4 mg Oral Daily   Continuous Infusions:  lactated ringers  100 mL/hr at 11/20/23 2242   PRN Meds:.acetaminophen  **OR** acetaminophen , melatonin, ondansetron  (ZOFRAN ) IV, mouth rinse, prochlorperazine   Antimicrobials: Anti-infectives (From admission, onward)    None        I have personally reviewed the following labs and images: CBC: Recent Labs  Lab 11/20/23 1830 11/21/23 0004  WBC 9.6 9.7  NEUTROABS  --  8.8*  HGB 9.5* 9.4*  HCT 31.0* 30.4*  MCV 96.6 94.7  PLT 252 230   BMP &GFR Recent Labs  Lab 11/20/23 1830 11/20/23 2047 11/21/23 0004  NA 142  --  140  K 4.0  --  4.5  CL 107  --  104  CO2 19*  --  21*  GLUCOSE 135*  --  200*  BUN 41*  --  41*  CREATININE 3.28*  --  3.18*  CALCIUM 9.1  --  9.0  MG  --  1.9 1.9   Estimated Creatinine Clearance: 23.5 mL/min (A) (by C-G formula based on SCr of 3.18 mg/dL (H)). Liver & Pancreas: Recent Labs  Lab 11/20/23 1830 11/21/23 0004  AST 14* 13*  ALT 9 11  ALKPHOS 72 74  BILITOT 0.6 0.5  PROT 6.8 6.9  ALBUMIN 2.9* 2.9*   Recent Labs  Lab 11/20/23 1830  LIPASE 36   No results for input(s): AMMONIA in the last 168  hours. Diabetic: No results for input(s): HGBA1C in the last 72 hours. Recent Labs  Lab 11/20/23 1818  GLUCAP 121*   Cardiac Enzymes: Recent Labs  Lab 11/21/23 0004  CKTOTAL 69   No results for input(s): PROBNP in the last 8760 hours. Coagulation Profile: No results for input(s): INR, PROTIME in the last 168 hours. Thyroid  Function Tests: Recent Labs    11/21/23 0004  TSH 1.517   Lipid Profile: No results for input(s): CHOL, HDL, LDLCALC, TRIG, CHOLHDL, LDLDIRECT in the last 72 hours. Anemia Panel: Recent Labs    11/21/23 0004  VITAMINB12 <150*  FOLATE 10.9  FERRITIN 102  TIBC 242*  IRON 31*  RETICCTPCT 1.1   Urine analysis:    Component Value Date/Time   COLORURINE YELLOW 11/21/2023 0125   APPEARANCEUR HAZY (A) 11/21/2023 0125   APPEARANCEUR Clear 05/05/2023 1122   LABSPEC 1.009 11/21/2023 0125   PHURINE 6.0 11/21/2023 0125   GLUCOSEU NEGATIVE 11/21/2023 0125   HGBUR SMALL (A) 11/21/2023 0125   BILIRUBINUR NEGATIVE 11/21/2023 0125   BILIRUBINUR Negative 05/05/2023 1122   KETONESUR NEGATIVE 11/21/2023 0125   PROTEINUR NEGATIVE 11/21/2023 0125   NITRITE NEGATIVE 11/21/2023 0125   LEUKOCYTESUR LARGE (A) 11/21/2023 0125   Sepsis Labs: Invalid input(s): PROCALCITONIN, LACTICIDVEN  Microbiology: No results found for this or any previous visit (from the past 240 hours).  Radiology Studies: CT Head Wo Contrast Result Date: 11/20/2023 EXAM: CT HEAD WITHOUT CONTRAST 11/20/2023 06:46:37 PM TECHNIQUE: CT of the head was performed without the administration of intravenous contrast. Automated exposure control, iterative reconstruction, and/or weight based adjustment of the mA/kV was utilized to reduce the radiation dose to as low as reasonably achievable. COMPARISON: 11/28/2020 CLINICAL HISTORY: Mental status change, unknown cause. Patient was donating blood and passed out, patient did not fall; No known hx of stroke. FINDINGS: BRAIN AND  VENTRICLES: No acute hemorrhage. No evidence of acute infarct. No hydrocephalus. No extra-axial collection. No mass effect or midline shift. Nonspecific hypoattenuation in the periventricular and subcortical white matter, most likely representing chronic microvascular ischemic changes. ORBITS: No acute abnormality. SINUSES: No acute abnormality. SOFT TISSUES AND SKULL: No acute soft tissue abnormality. No skull fracture. Bilateral lens replacement. VASCULATURE: Atherosclerosis of the carotid siphons. IMPRESSION: 1. No acute intracranial abnormality. 2. Findings consistent with chronic microvascular ischemic disease. Electronically signed by: Donnice Mania MD 11/20/2023 07:02 PM EDT RP Workstation: [id number]      Paydon Carll T. Yuri Flener [facility]  If 7PM-7AM, please contact night-coverage [email or web address] 11/21/2023, 11:24 AM

## 2023-11-21 NOTE — Evaluation (Signed)
 Physical Therapy Evaluation Patient Details Name: Angel Norman MRN: [id number] DOB: [birth year] Today's Date: 11/21/2023  History of Present Illness  Pt is an 89 y/o M admitted on 11/20/23 after presenting with c/o syncopal event after donating blood. Pt is being treated for AKI, syncope, generalized weakness. PMH: HLD, BPH, anemia of chronic disease  Clinical Impression  Pt seen for PT evaluation with pt agreeable, daughter present in room. Prior to admission pt was independent without AD, ambulating household distances but using w/c at least 1x/week or in community 2/2 fatigue.  On this date, pt is able to complete bed mobility with mod I, sit>stand with supervision & ambulates in room without AD but intermittently reaching for furniture with CGA<>supervision. Pt is positive for orthostatic hypotension but declines symptoms. Recommend ongoing PT services to progress mobility as able.  BP checked in LUE  BP HR  Supine  148/88 mmHg MAP 103 72 bpm  Sitting 124/66 mmHg MAP 85 82 bpm  Standing at 0 113/59 mmHg MAP 77 90 bpm  Sitting in recliner after gait 115/63 mmHg MAP 79 89 bpm      If plan is discharge home, recommend the following: A little help with walking and/or transfers;A little help with bathing/dressing/bathroom;Help with stairs or ramp for entrance;Assistance with cooking/housework;Assist for transportation   Can travel by private vehicle        Equipment Recommendations None recommended by PT (pt has RW)  Recommendations for Other Services       Functional Status Assessment Patient has had a recent decline in their functional status and demonstrates the ability to make significant improvements in function in a reasonable and predictable amount of time.     Precautions / Restrictions Precautions Precautions: Fall Restrictions Weight Bearing Restrictions Per Provider Order: No      Mobility  Bed Mobility Overal bed mobility: Modified Independent, Needs Assistance Bed  Mobility: Supine to Sit     Supine to sit: Modified independent (Device/Increase time), HOB elevated, Used rails (exit L side of bed)          Transfers Overall transfer level: Needs assistance Equipment used: None Transfers: Sit to/from Stand Sit to Stand: Supervision           General transfer comment: sit>stand from EOB    Ambulation/Gait Ambulation/Gait assistance: Supervision, Contact guard assist Gait Distance (Feet): 15 Feet Assistive device: None Gait Pattern/deviations: Decreased step length - left, Decreased step length - right Gait velocity: decreased     General Gait Details: Pt intermittently reaching for furniture in room but no overt LOB  Stairs            Wheelchair Mobility     Tilt Bed    Modified Rankin (Stroke Patients Only)       Balance Overall balance assessment: Needs assistance Sitting-balance support: Feet supported Sitting balance-Leahy Scale: Good     Standing balance support: During functional activity, Single extremity supported, No upper extremity supported Standing balance-Leahy Scale: Fair                               Pertinent Vitals/Pain Pain Assessment Pain Assessment: No/denies pain    Home Living Family/patient expects to be discharged to:: Private residence Living Arrangements: Alone Available Help at Discharge: Family;Available PRN/intermittently Type of Home: House Home Access: Stairs to enter Entrance Stairs-Rails: Right;Left Entrance Stairs-Number of Steps: 3   Home Layout: Multi-level;Able to live on main level with bedroom/bathroom  Home Equipment: Agricultural consultant (2 wheels);Cane - single point;Lift chair;Wheelchair - manual      Prior Function Prior Level of Function : Driving             Mobility Comments: ambulates household distances without AD but uses a w/c at least 1x/week 2/2 fatigue, denies falls in the past 6 months, still driving ADLs Comments: family assists with  cleaning, pt has homemade sock aide to assist with dressing but does not need physical assistance, bathes without assistance, meal preps     Extremity/Trunk Assessment   Upper Extremity Assessment Upper Extremity Assessment: Overall WFL for tasks assessed    Lower Extremity Assessment Lower Extremity Assessment: Generalized weakness       Communication   Communication Communication: Impaired Factors Affecting Communication: Hearing impaired    Cognition Arousal: Alert Behavior During Therapy: WFL for tasks assessed/performed   PT - Cognitive impairments: Awareness, Orientation, Safety/Judgement   Orientation impairments: Time, Situation (oriented to year but not month, reports he'll be 89 vs 89 next birthday)                     Following commands: Impaired Following commands impaired: Only follows one step commands consistently     Cueing Cueing Techniques: Verbal cues     General Comments General comments (skin integrity, edema, etc.): assisted pt with changing into clean gown, pt with continent void standing with urinal    Exercises     Assessment/Plan    PT Assessment Patient needs continued PT services  PT Problem List Decreased range of motion;Decreased strength;Decreased cognition;Decreased activity tolerance;Decreased balance;Decreased mobility;Decreased safety awareness;Decreased knowledge of precautions;Decreased knowledge of use of DME       PT Treatment Interventions DME instruction;Balance training;Gait training;Neuromuscular re-education;Stair training;Functional mobility training;Therapeutic activities;Therapeutic exercise;Wheelchair mobility training;Patient/family education    PT Goals (Current goals can be found in the Care Plan section)  Acute Rehab PT Goals Patient Stated Goal: get better PT Goal Formulation: With patient/family Time For Goal Achievement: 12/05/23 Potential to Achieve Goals: Good    Frequency Min 2X/week      Co-evaluation               AM-PAC PT 6 Clicks Mobility  Outcome Measure Help needed turning from your back to your side while in a flat bed without using bedrails?: None Help needed moving from lying on your back to sitting on the side of a flat bed without using bedrails?: A Little Help needed moving to and from a bed to a chair (including a wheelchair)?: A Little Help needed standing up from a chair using your arms (e.g., wheelchair or bedside chair)?: A Little Help needed to walk in hospital room?: A Little Help needed climbing 3-5 steps with a railing? : A Lot 6 Click Score: 18    End of Session   Activity Tolerance: Patient tolerated treatment well Patient left: in chair;with call bell/phone within reach;with chair alarm set Nurse Communication: Mobility status (+ orthostatic but no symptomatic) PT Visit Diagnosis: Unsteadiness on feet (R26.81);Other abnormalities of gait and mobility (R26.89)    Time: 1127-1150 PT Time Calculation (min) (ACUTE ONLY): 23 min   Charges:   PT Evaluation $PT Eval Low Complexity: 1 Low   PT General Charges $$ ACUTE PT VISIT: 1 Visit         Richerd Pinal, PT, DPT 11/21/23, 12:02 PM   Richerd CHRISTELLA Pinal 11/21/2023, 11:59 AM

## 2023-11-21 NOTE — Plan of Care (Signed)
  Problem: Education: Goal: Knowledge of General Education information will improve Description Including pain rating scale, medication(s)/side effects and non-pharmacologic comfort measures Outcome: Progressing   Problem: Health Behavior/Discharge Planning: Goal: Ability to manage health-related needs will improve Outcome: Progressing   Problem: Clinical Measurements: Goal: Will remain free from infection Outcome: Progressing   Problem: Clinical Measurements: Goal: Cardiovascular complication will be avoided Outcome: Progressing   

## 2023-11-21 NOTE — Evaluation (Signed)
 Occupational Therapy Evaluation Patient Details Name: Angel Norman MRN: [id number] DOB: 01-29-1933 Today's Date: 11/21/2023   History of Present Illness   Pt is an 89 y/o M admitted on 11/20/23 after presenting with c/o syncopal event after donating blood. Pt is being treated for AKI, syncope, generalized weakness. PMH: HLD, BPH, anemia of chronic disease     Clinical Impressions At baseline, pt completes ADLs Ind to Contact guard assist, functional mobility Ind to Mod I, drives, and receives assist from family for home management tasks. Pt now presents with decreased activity tolerance, decreased balance, and decreased safety and independence with functional tasks. Pt currently near his baseline PLOF with ADLs with pt demonstrating ability to largely complete ADLs Ind to Contact guard assist. Pt currently completing functional mobility with Supervision to Contact guard assist. Pt currently needing frequent rest breaks during tasks. Pt participated well in session and has good family support. Pt will benefit from acute skilled OT services to address deficits and increase safety and independence with functional tasks. No post acute skilled OT needs are anticipated at this time.       If plan is discharge home, recommend the following:   A little help with walking and/or transfers;A little help with bathing/dressing/bathroom;Assistance with cooking/housework;Assist for transportation;Help with stairs or ramp for entrance     Functional Status Assessment   Patient has had a recent decline in their functional status and demonstrates the ability to make significant improvements in function in a reasonable and predictable amount of time.     Equipment Recommendations   None recommended by OT (Pt already has needed equipment)     Recommendations for Other Services         Precautions/Restrictions   Precautions Precautions: Fall Restrictions Weight Bearing Restrictions Per  Provider Order: No     Mobility Bed Mobility Overal bed mobility: Modified Independent, Needs Assistance Bed Mobility: Supine to Sit, Sit to Supine     Supine to sit: Modified independent (Device/Increase time) Sit to supine: Modified independent (Device/Increase time)   General bed mobility comments: increased time and effort    Transfers Overall transfer level: Needs assistance Equipment used: None Transfers: Sit to/from Stand, Bed to chair/wheelchair/BSC Sit to Stand: Supervision     Step pivot transfers: Supervision, Contact guard assist            Balance Overall balance assessment: Needs assistance Sitting-balance support: No upper extremity supported, Feet supported Sitting balance-Leahy Scale: Good     Standing balance support: Single extremity supported, No upper extremity supported, During functional activity Standing balance-Leahy Scale: Fair                             ADL either performed or assessed with clinical judgement   ADL Overall ADL's : Needs assistance/impaired Eating/Feeding: Independent;Sitting   Grooming: Set up;Sitting   Upper Body Bathing: Supervision/ safety;Set up;Sitting   Lower Body Bathing: Supervison/ safety;Contact guard assist;Sit to/from stand   Upper Body Dressing : Set up;Sitting   Lower Body Dressing: Set up;Supervision/safety;Contact guard assist;Sit to/from stand   Toilet Transfer: Supervision/safety;Contact guard assist;Ambulation;Regular Toilet (without an AD)   Toileting- Clothing Manipulation and Hygiene: Supervision/safety;Sit to/from stand       Functional mobility during ADLs: Supervision/safety;Contact guard assist (without an AD) General ADL Comments: Pt with decreased activity tolerance, fatiguing quickly during tasks     Vision Patient Visual Report: No change from baseline Additional Comments: [facility] for tasks assessed;  not formally screened or evaluated     Perception          Praxis         Pertinent Vitals/Pain Pain Assessment Pain Assessment: No/denies pain     Extremity/Trunk Assessment Upper Extremity Assessment Upper Extremity Assessment: Left hand dominant;Overall [facility] for tasks assessed   Lower Extremity Assessment Lower Extremity Assessment: Defer to PT evaluation       Communication Communication Communication: Impaired Factors Affecting Communication: Hearing impaired   Cognition Arousal: Alert Behavior During Therapy: WFL for tasks assessed/performed Cognition: Difficult to assess Difficult to assess due to: Hard of hearing/deaf           OT - Cognition Comments: Pt AAOx4. Pt with decreased safety awareness and, per family report, pt requires encouragement for washing/thoroughness with hyigiene. Difficult to assess further due to pt being significantly HOH.                 Following commands: Impaired Following commands impaired: Only follows one step commands consistently     Cueing  General Comments   Cueing Techniques: Verbal cues;Gestural cues;Visual cues  Multiple family members present and supportive during session.   Exercises     Shoulder Instructions      Home Living Family/patient expects to be discharged to:: Private residence Living Arrangements: Alone Available Help at Discharge: Family;Available PRN/intermittently Type of Home: House Home Access: Stairs to enter [employer] of Steps: 3 Entrance Stairs-Rails: Right;Left Home Layout: Multi-level;Able to live on main level with bedroom/bathroom     Bathroom Shower/Tub: Tub/shower unit         Home Equipment: Agricultural consultant (2 wheels);Cane - single point;Lift chair;Wheelchair - manual;Tub bench          Prior Functioning/Environment Prior Level of Function : Driving;Independent/Modified Independent;Needs assist             Mobility Comments: ambulates household distances without AD but uses a w/c at least 1x/week 2/2  fatigue; uses SPC for longer distances and in the community; denies falls in the past 6 months ADLs Comments: family assists with cleaning; pt dresses Mod I with a homemade sock aide, is Ind for toileting, and family provides Supervision to [facility] for bathing 1x per week; family reports pt needs encouragement to wash up at the sink at baseline; pt completes light meal prep Independnetly and family also assists with meal prep    OT Problem List: Decreased activity tolerance;Impaired balance (sitting and/or standing)   OT Treatment/Interventions: Self-care/ADL training;DME and/or AE instruction;Energy conservation;Therapeutic activities;Patient/family education;Balance training      OT Goals(Current goals can be found in the care plan section)   Acute Rehab OT Goals Patient Stated Goal: to return home OT Goal Formulation: With patient/family Time For Goal Achievement: 12/05/23 Potential to Achieve Goals: Good ADL Goals Pt Will Perform Grooming: with modified independence;standing Pt Will Perform Lower Body Dressing: with modified independence;with adaptive equipment;sit to/from stand Pt Will Transfer to Toilet: with modified independence;ambulating;regular height toilet (with least restrictive AD) Pt Will Perform Toileting - Clothing Manipulation and hygiene: with modified independence;sit to/from stand Additional ADL Goal #1: Patient will demonstrate ability to participate in 3 or more minutes of a functional or therapeutic task without the need for a rest break.   OT Frequency:  Min 1X/week    Co-evaluation              AM-PAC OT 6 Clicks Daily Activity     Outcome Measure Help from another person eating meals?: None  Help from another person taking care of personal grooming?: A Little Help from another person toileting, which includes using toliet, bedpan, or urinal?: A Little Help from another person bathing (including washing, rinsing, drying)?: A Little Help from another  person to put on and taking off regular upper body clothing?: A Little Help from another person to put on and taking off regular lower body clothing?: A Little 6 Click Score: 19   End of Session Nurse Communication: Mobility status;Other (comment) (Pt's IV is beeping)  Activity Tolerance: Patient tolerated treatment well Patient left: in bed;with call bell/phone within reach;with family/visitor present  OT Visit Diagnosis: Unsteadiness on feet (R26.81)                Time: 8464-8441 OT Time Calculation (min): 23 min Charges:  OT General Charges $OT Visit: 1 Visit OT Evaluation $OT Eval Low Complexity: 1 Low  Margarie Rockey HERO., OTR/L, MA Acute Rehab [phone]   Margarie FORBES Horns 11/21/2023, 6:18 PM

## 2023-11-21 NOTE — Care Management Obs Status (Signed)
 MEDICARE OBSERVATION STATUS NOTIFICATION   Patient Details  Name: Angel Norman MRN: [id number] Date of Birth: [birth year]   Medicare Observation Status Notification Given:     Obs notice signed and copy given.   [name] 11/21/2023, 12:06 PM

## 2023-11-21 NOTE — Progress Notes (Signed)
  Echocardiogram 2D Echocardiogram has been performed.  Koleen KANDICE Popper, RDCS 11/21/2023, 3:56 PM

## 2023-11-21 NOTE — TOC CM/SW Note (Signed)
 Transition of Care [facility]) - Inpatient Brief Assessment   Patient Details  Name: Angel Norman MRN: [id number] Date of Birth: [birth year]  Transition of [facility]) CM/SW Contact:    Tom-Johnson, Bonna Steury Daphne, RN Phone Number: 11/21/2023, 4:20 PM   Clinical Narrative:  Patient presented to the ED after a Syncopal episode. Admitted with AKI and Syncope started on IV fluids.    CM spoke with patient and daughter, [place] at bedside. Patient lives alone, has two children, his son with Down Syndrome was living with him but now staying with Tilton as patient is unable to care for him. Retired, drives self to and from appointments. Has a cane, walker, w/c  lift chair, rails and shower bench. PCP is Loreli Kins, MD and uses [facility] on [address]  Home health recommended, patient and daughter has no preference. CM called in referral to [facility] and Artavia voiced acceptance, info on AVS.   Patient not Medically ready for discharge.  CM will continue to follow as patient progresses with care towards discharge.              Transition of Care Asessment: Insurance and Status: Insurance coverage has been reviewed Patient has primary care physician: Yes Home environment has been reviewed: Yes Prior level of function:: Modified Independent Prior/Current Home Services: No current home services Social Drivers of Health Review: SDOH reviewed no interventions necessary Readmission risk has been reviewed: Yes Transition of care needs: transition of care needs identified, TOC will continue to follow

## 2023-11-22 DIAGNOSIS — E538 Deficiency of other specified B group vitamins: Secondary | ICD-10-CM

## 2023-11-22 DIAGNOSIS — N179 Acute kidney failure, unspecified: Secondary | ICD-10-CM | POA: Diagnosis not present

## 2023-11-22 DIAGNOSIS — N1330 Unspecified hydronephrosis: Secondary | ICD-10-CM | POA: Insufficient documentation

## 2023-11-22 DIAGNOSIS — E8729 Other acidosis: Secondary | ICD-10-CM | POA: Diagnosis not present

## 2023-11-22 DIAGNOSIS — R531 Weakness: Secondary | ICD-10-CM | POA: Diagnosis not present

## 2023-11-22 DIAGNOSIS — R55 Syncope and collapse: Secondary | ICD-10-CM | POA: Diagnosis not present

## 2023-11-22 LAB — RENAL FUNCTION PANEL
Albumin: 2.9 g/dL — ABNORMAL LOW (ref 3.5–5.0)
Anion gap: 10 (ref 5–15)
BUN: 38 mg/dL — ABNORMAL HIGH (ref 8–23)
CO2: 23 mmol/L (ref 22–32)
Calcium: 8.5 mg/dL — ABNORMAL LOW (ref 8.9–10.3)
Chloride: 105 mmol/L (ref 98–111)
Creatinine, Ser: 3.24 mg/dL — ABNORMAL HIGH (ref 0.61–1.24)
GFR, Estimated: 18 mL/min — ABNORMAL LOW (ref >60)
Glucose, Bld: 155 mg/dL — ABNORMAL HIGH (ref 70–99)
Phosphorus: 4.1 mg/dL (ref 2.5–4.6)
Potassium: 4.3 mmol/L (ref 3.5–5.1)
Sodium: 138 mmol/L (ref 135–145)

## 2023-11-22 LAB — CBC
HCT: 28.3 % — ABNORMAL LOW (ref 39.0–52.0)
Hemoglobin: 8.6 g/dL — ABNORMAL LOW (ref 13.0–17.0)
MCH: 29.1 pg (ref 26.0–34.0)
MCHC: 30.4 g/dL (ref 30.0–36.0)
MCV: 95.6 fL (ref 80.0–100.0)
Platelets: 210 K/uL (ref 150–400)
RBC: 2.96 MIL/uL — ABNORMAL LOW (ref 4.22–5.81)
RDW: 15.8 % — ABNORMAL HIGH (ref 11.5–15.5)
WBC: 8.5 K/uL (ref 4.0–10.5)
nRBC: 0 % (ref 0.0–0.2)

## 2023-11-22 LAB — MAGNESIUM: Magnesium: 1.7 mg/dL (ref 1.7–2.4)

## 2023-11-22 LAB — GLUCOSE, CAPILLARY
Glucose-Capillary: 195 mg/dL — ABNORMAL HIGH (ref 70–99)
Glucose-Capillary: 113 mg/dL — ABNORMAL HIGH (ref 70–99)

## 2023-11-22 MED ORDER — PANTOPRAZOLE SODIUM 40 MG PO TBEC: 40.0000 mg | DELAYED_RELEASE_TABLET | Freq: Every day | ORAL | Status: AC

## 2023-11-22 MED ORDER — LACTATED RINGERS IV SOLN: INTRAVENOUS | Status: DC

## 2023-11-22 MED ORDER — CYANOCOBALAMIN 1000 MCG PO TABS: 1000.0000 ug | ORAL_TABLET | Freq: Every day | ORAL | Status: AC

## 2023-11-22 NOTE — Progress Notes (Signed)
 Patient discharged to home. Discharge completed by Violeta, RN. See nurse's note for details. Patient left unit in wheelchair pushed by [place], Psychologist, sport and exercise. Left in stable condition.

## 2023-11-22 NOTE — TOC Transition Note (Signed)
 Transition of Care [facility]) - Discharge Note   Patient Details  Name: Angel Norman MRN: [id number] Date of Birth: 06-07-1932  Transition of Care [facility]) CM/SW Contact:  Robynn Eileen Hoose, RN Phone Number: 11/22/2023, 11:59 AM   Clinical Narrative:  Patient is being discharged today. Artavia with [facility] made aware.    Final next level of care: Home w Home Health Services Barriers to Discharge: No Barriers Identified   Patient Goals and CMS Choice            Discharge Placement                       Discharge Plan and Services Additional resources added to the After Visit Summary for                                       Social Drivers of Health (SDOH) Interventions SDOH Screenings   Tobacco Use: Low Risk  (11/21/2023)     Readmission Risk Interventions    11/21/2023    4:19 PM 04/26/2021   11:29 AM  Readmission Risk Prevention Plan  Transportation Screening Complete Complete  PCP or Specialist Appt within 5-7 Days Complete Complete  Home Care Screening Complete Complete  Medication Review (RN CM) Referral to Pharmacy Complete

## 2023-11-22 NOTE — Discharge Summary (Signed)
 Physician Discharge Summary  [place] [facility] FMW:[id number] DOB: 08-Feb-1933 DOA: 11/20/2023  PCP: Loreli Kins, MD  Admit date: 11/20/2023 Discharge date: 11/22/23  Admitted From: Home Disposition: Home Recommendations for Outpatient Follow-up:  Outpatient follow-up with PCP and urology in 1 to 2 weeks Recommend ambulatory referral to nephrology for progressive renal failure and anemia Check CMP and CBC at follow-up Please follow up on the following pending results: None  Home Health: [facility] PT/OT/RN Equipment/Devices: Patient has appropriate DME  Discharge Condition: Stable CODE STATUS: Full code Diet Orders (From admission, onward)     Start     Ordered   11/20/23 2133  Diet regular Room service appropriate? Yes; Fluid consistency: Thin  Diet effective now       Question Answer Comment  Room service appropriate? Yes   Fluid consistency: Thin      11/20/23 2132             Follow-up Information     [facility], [facility] [place]  Follow up.   Why: Someone will call you to schedule first home visit. Contact information: [address] [place] [address] [phone]         Loreli Kins, MD. Schedule an appointment as soon as possible for a visit in 1 week(s).   Specialty: Family Medicine Contact information: [address] [place] [address] [phone]                 Hospital course 89 year old M with PMH of CKD-3, DM-2 with neuropathy, osteoarthritis, BPH,  HLD, anemia, obesity and impaired hearing brought to ED after he had a syncopal episode while donating blood, and admitted for AKI and syncope.  Had associated nausea, vomiting and weakness.  Patient reports donating blood regularly for many years.   In ED, stable vitals. Cr 3.28 (1.5 in 07/2023).  BUN 41.  Bicarb 19.  AG 16.  Serial troponin negative.  EKG with first-degree AV block.  BNP normal.  Hgb 9.5 (unknown baseline).  UA with pyuria.  CT head without  acute finding.  Started on IV fluids and admitted for further care.   Creatinine remains elevated at 3.18.  Continued on IV fluid.  Renal ultrasound with mild to moderate left-sided hydronephrosis and fluid-filled, dilated renal pelvis without hydronephrosis, BPH and trabeculation of the bladder but no obstructing stone.  CT without significant finding.  On the day of discharge, creatinine remained elevated at 3.2 suggesting progressive CKD versus AKI.  Excellent urine output.  He is already on Flomax  and Proscar .  He is discharged to follow-up with PCP and urology in 1 to 2 weeks.  Advised to avoid NSAID.  Also recommend referral to nephrology outpatient.  The above plan was discussed with patient's daughter over the phone on the day of discharge.  See individual problem list below for more.   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  Carries history of CKD-3A.  His creatinine was 1.5 in 07/2023.  No interval value.  Not on nephrotoxic meds.  Daughter denies NSAID use.  Has history of BPH.  Renal US  as above.  Creatinine remained elevated at 3.2 despite IV fluid -Continue Flomax , Proscar  -Outpatient follow-up with primary urologist, Dr. Nieves -Recommend ambulatory referral to nephrology -Check CBC and CMP at follow-up.   Syncopal episode: Reportedly passed out while donating blood.  He is a lifetime donor.  His hemoglobin is 9.5 and has been stable since then.  EKG  sinus rhythm with first-degree AVB and chronic LBBB.  Serial troponin and BNP normal.  TTE without significant finding   Anemia of chronic disease/vitamin B12 deficiency:   Patient is a blood donor.  Unknown baseline.  H&H stable. Anemia panel with very low vitamin B12 likely from chronic PPI use.  Hgb dropped about 1 g likely dilutional from IV fluid.  No overt bleeding.  He is not on anticoagulation or antiplatelet. -Vitamin B12 injection 1000 mcg daily x 2 followed by p.o. -Another  reason for referral to nephrology -Recheck CBC at follow-up. -Advised to stop blood donation   NIDDM-2 with hyperglycemia: A1c 6.4%. -Continue home Rybelsus  Hyperlipidemia - Continue home pravastatin    GERD - Continue home PPI but recommend weaning off if possible   BPH - Continue home Flomax  and Proscar    Hard of hearing   Physical deconditioning -HH PT/OT ordered.  Morbid obesity: Elevated BMI with diabetes. Body mass index is 36.43 kg/m.           Consultations: None  Time spent 35  minutes  Vital signs Vitals:   11/21/23 1655 11/21/23 2139 11/22/23 0357 11/22/23 0755  BP: (!) 140/74 129/71 126/87 124/68  Pulse: 86 77 72 82  Temp: (!) 97.5 F (36.4 C) 98.2 F (36.8 C) 97.8 F (36.6 C) 98 F (36.7 C)  Resp:  18 16 18   Height:      Weight:      SpO2: 98% 100% 94% 97%  TempSrc: Oral  Oral Oral  BMI (Calculated):         Discharge exam  GENERAL: No apparent distress.  Nontoxic. HEENT: MMM.  Vision grossly intact.  Diminished hearing. NECK: Supple.  No apparent JVD.  RESP:  No IWOB.  Fair aeration bilaterally. CVS:  RRR. Heart sounds normal.  ABD/GI/GU: BS+. Abd soft, NTND.  MSK/EXT:  Moves extremities. No apparent deformity. No edema.  SKIN: no apparent skin lesion or wound NEURO: Awake and alert. Oriented appropriately.  No apparent focal neuro deficit. PSYCH: Calm. Normal affect.   Discharge Instructions Discharge Instructions     Discharge instructions   Complete by: As directed    It has been a pleasure taking care of you!  You were hospitalized due to syncope and elevated kidney number (creatinine).  Your echocardiogram is basically normal.   It is unclear if your creatinine elevation is acute or or gradual but remained stable during this hospitalization. The ultrasound of your kidney shows some backup of urine likely from enlarged prostate.  You are already on medication for enlarged prostate.  We recommend you follow-up with your  urologist or establish care as soon as possible.  We recommend good hydration and avoiding any over-the-counter pain medication other than plain Tylenol .  Follow-up with your primary care doctor in 1 to 2 weeks to have your levels rechecked.     Take care,   Increase activity slowly   Complete by: As directed       Allergies as of 11/22/2023   No Known Allergies      Medication List     TAKE these medications    acetaminophen  325 MG tablet Commonly known as: TYLENOL  Take 2 tablets (650 mg total) by mouth every 6 (six) hours.   albuterol  108 (90 Base) MCG/ACT inhaler Commonly known as: VENTOLIN  HFA Inhale 1 puff into the lungs in the morning and at bedtime.   ferrous sulfate  325 (65 FE) MG EC tablet Take 325 mg by mouth daily with breakfast.  finasteride  5 MG tablet Commonly known as: PROSCAR  Take 1 tablet (5 mg total) by mouth daily.   multivitamin tablet Take 1 tablet by mouth daily.   pantoprazole  40 MG tablet Commonly known as: PROTONIX  Take 1 tablet (40 mg total) by mouth daily. What changed: when to take this   polyethylene glycol 17 g packet Commonly known as: MIRALAX  / GLYCOLAX  Take 17 g by mouth daily as needed for mild constipation.   pravastatin  80 MG tablet Commonly known as: PRAVACHOL  Take 80 mg by mouth daily.   Rybelsus 7 MG Tabs Generic drug: Semaglutide Take 7 mg by mouth daily.   tamsulosin  0.4 MG Caps capsule Commonly known as: FLOMAX  Take 0.4 mg by mouth daily.         Procedures/Studies:   ECHOCARDIOGRAM COMPLETE Result Date: 11/21/2023    ECHOCARDIOGRAM REPORT   Patient Name:   [place] Date of Exam: 11/21/2023 Medical Rec #:  [id number]    Height:       75.0 in Accession #:    [id number]   Weight:       291.4 lb Date of Birth:  [birth year]    BSA:          2.575 m Patient Age:    89 years     BP:           139/76 mmHg Patient Gender: M            HR:           70 bpm. Exam Location:  Inpatient Procedure: 2D Echo, Cardiac  Doppler, Color Doppler and Intracardiac            Opacification Agent (Both Spectral and Color Flow Doppler were            utilized during procedure). Indications:    Syncope R55  History:        Patient has prior history of Echocardiogram examinations, most                 recent 11/30/2023. CKD, Signs/Symptoms:Syncope; Risk                 Factors:Dyslipidemia and Diabetes.  Sonographer:    Koleen Popper RDCS Referring Phys: [id number] MIGNON DASEN Marilynne Dupuis IMPRESSIONS  1. Abnormal septal motion . Left ventricular ejection fraction, by estimation, is 50 to 55%. The left ventricle has low normal function. The left ventricle has no regional wall motion abnormalities. There is moderate left ventricular hypertrophy. Left ventricular diastolic parameters are consistent with Grade I diastolic dysfunction (impaired relaxation).  2. Right ventricular systolic function is normal. The right ventricular size is normal.  3. Left atrial size was mildly dilated.  4. The mitral valve is abnormal. No evidence of mitral valve regurgitation. No evidence of mitral stenosis.  5. The aortic valve is tricuspid. There is mild calcification of the aortic valve. There is mild thickening of the aortic valve. Aortic valve regurgitation is mild. Aortic valve sclerosis is present, with no evidence of aortic valve stenosis.  6. Aortic dilatation noted. There is mild dilatation of the ascending aorta, measuring 40 mm.  7. The inferior vena cava is normal in size with greater than 50% respiratory variability, suggesting right atrial pressure of 3 mmHg. FINDINGS  Left Ventricle: Abnormal septal motion. Left ventricular ejection fraction, by estimation, is 50 to 55%. The left ventricle has low normal function. The left ventricle has no regional wall motion abnormalities. Definity  contrast agent was given IV to delineate  the left ventricular endocardial borders. Strain was performed and the global longitudinal strain is indeterminate. The left ventricular  internal cavity size was normal in size. There is moderate left ventricular hypertrophy. Left ventricular diastolic parameters are consistent with Grade I diastolic dysfunction (impaired relaxation). Right Ventricle: The right ventricular size is normal. No increase in right ventricular wall thickness. Right ventricular systolic function is normal. Left Atrium: Left atrial size was mildly dilated. Right Atrium: Right atrial size was normal in size. Pericardium: There is no evidence of pericardial effusion. Mitral Valve: The mitral valve is abnormal. There is mild thickening of the mitral valve leaflet(s). Mild mitral annular calcification. No evidence of mitral valve regurgitation. No evidence of mitral valve stenosis. Tricuspid Valve: The tricuspid valve is normal in structure. Tricuspid valve regurgitation is not demonstrated. No evidence of tricuspid stenosis. Aortic Valve: The aortic valve is tricuspid. There is mild calcification of the aortic valve. There is mild thickening of the aortic valve. Aortic valve regurgitation is mild. Aortic valve sclerosis is present, with no evidence of aortic valve stenosis. Pulmonic Valve: The pulmonic valve was normal in structure. Pulmonic valve regurgitation is trivial. No evidence of pulmonic stenosis. Aorta: Aortic dilatation noted. There is mild dilatation of the ascending aorta, measuring 40 mm. Venous: The inferior vena cava is normal in size with greater than 50% respiratory variability, suggesting right atrial pressure of 3 mmHg. IAS/Shunts: No atrial level shunt detected by color flow Doppler. Additional Comments: 3D was performed not requiring image post processing on an independent workstation and was indeterminate.  LEFT VENTRICLE PLAX 2D LVIDd:         6.60 cm   Diastology LVIDs:         4.70 cm   LV e' medial:    5.22 cm/s LV PW:         1.10 cm   LV E/e' medial:  11.3 LV IVS:        1.40 cm   LV e' lateral:   7.51 cm/s LVOT diam:     1.90 cm   LV E/e' lateral:  7.9 LV SV:         56 LV SV Index:   22 LVOT Area:     2.84 cm  RIGHT VENTRICLE            IVC RV S prime:     9.24 cm/s  IVC diam: 2.00 cm TAPSE (M-mode): 2.3 cm LEFT ATRIUM             Index        RIGHT ATRIUM           Index LA diam:        4.10 cm 1.59 cm/m   RA Area:     12.60 cm LA Vol (A2C):   52.7 ml 20.46 ml/m  RA Volume:   29.20 ml  11.34 ml/m LA Vol (A4C):   70.0 ml 27.18 ml/m LA Biplane Vol: 63.6 ml 24.70 ml/m  AORTIC VALVE LVOT Vmax:   99.00 cm/s LVOT Vmean:  63.400 cm/s LVOT VTI:    0.198 m  AORTA Ao Root diam: 3.50 cm Ao Asc diam:  4.00 cm MITRAL VALVE MV Area (PHT): 2.99 cm    SHUNTS MV Decel Time: 254 msec    Systemic VTI:  0.20 m MV E velocity: 59.20 cm/s  Systemic Diam: 1.90 cm MV A velocity: 93.30 cm/s MV E/A ratio:  0.63 Maude Emmer MD Electronically signed by Maude Emmer MD Signature Date/Time:  11/21/2023/4:04:34 PM    Final    US  RENAL Result Date: 11/21/2023 CLINICAL DATA:  [id number] AKI (acute kidney injury) (HCC) [id number] EXAM: RENAL / URINARY TRACT ULTRASOUND COMPLETE COMPARISON:  12/06/2014 FINDINGS: Technically difficult exam due to patient's body habitus and overlying bowel gas. Right Kidney: Renal measurements: 11.9 x 6.4 x 6.6 cm = volume: 259 mL.Normal echogenicity. Lower pole cyst measuring 3 cm. Moderate pelviectasis without hydronephrosis or nephrolithiasis. Left Kidney: Renal measurements: 11.2 x 5.5 x 6.4 cm = volume: 5 4 mL. Normal echogenicity. No mass. Apparent mild-to-moderate hydronephrosis. No nephrolithiasis. Bladder: Partially distended, with trabeculation of the bladder wall. Findings suggestive of BPH present. Other: None. IMPRESSION: 1. Apparent mild-to-moderate left-sided hydronephrosis. The right renal pelvis is also fluid-filled and dilated without hydronephrosis. 2. Findings suggestive of BPH present. Trabeculation of the bladder wall, may reflect changes of chronic bladder outlet obstruction. Electronically Signed   By: Rogelia Myers M.D.   On:  11/21/2023 15:58   CT Head Wo Contrast Result Date: 11/20/2023 EXAM: CT HEAD WITHOUT CONTRAST 11/20/2023 06:46:37 PM TECHNIQUE: CT of the head was performed without the administration of intravenous contrast. Automated exposure control, iterative reconstruction, and/or weight based adjustment of the mA/kV was utilized to reduce the radiation dose to as low as reasonably achievable. COMPARISON: 11/28/2020 CLINICAL HISTORY: Mental status change, unknown cause. Patient was donating blood and passed out, patient did not fall; No known hx of stroke. FINDINGS: BRAIN AND VENTRICLES: No acute hemorrhage. No evidence of acute infarct. No hydrocephalus. No extra-axial collection. No mass effect or midline shift. Nonspecific hypoattenuation in the periventricular and subcortical white matter, most likely representing chronic microvascular ischemic changes. ORBITS: No acute abnormality. SINUSES: No acute abnormality. SOFT TISSUES AND SKULL: No acute soft tissue abnormality. No skull fracture. Bilateral lens replacement. VASCULATURE: Atherosclerosis of the carotid siphons. IMPRESSION: 1. No acute intracranial abnormality. 2. Findings consistent with chronic microvascular ischemic disease. Electronically signed by: Donnice Mania MD 11/20/2023 07:02 PM EDT RP Workstation: [id number]       The results of significant diagnostics from this hospitalization (including imaging, microbiology, ancillary and laboratory) are listed below for reference.     Microbiology: No results found for this or any previous visit (from the past 240 hours).   Labs:  CBC: Recent Labs  Lab 11/20/23 1830 11/21/23 0004 11/22/23 0949  WBC 9.6 9.7 8.5  NEUTROABS  --  8.8*  --   HGB 9.5* 9.4* 8.6*  HCT 31.0* 30.4* 28.3*  MCV 96.6 94.7 95.6  PLT 252 230 210   BMP &GFR Recent Labs  Lab 11/20/23 1830 11/20/23 2047 11/21/23 0004 11/22/23 0949  NA 142  --  140 138  K 4.0  --  4.5 4.3  CL 107  --  104 105  CO2 19*  --  21* 23   GLUCOSE 135*  --  200* 155*  BUN 41*  --  41* 38*  CREATININE 3.28*  --  3.18* 3.24*  CALCIUM 9.1  --  9.0 8.5*  MG  --  1.9 1.9 1.7  PHOS  --   --   --  4.1   Estimated Creatinine Clearance: 23.1 mL/min (A) (by C-G formula based on SCr of 3.24 mg/dL (H)). Liver & Pancreas: Recent Labs  Lab 11/20/23 1830 11/21/23 0004 11/22/23 0949  AST 14* 13*  --   ALT 9 11  --   ALKPHOS 72 74  --   BILITOT 0.6 0.5  --   PROT 6.8 6.9  --  ALBUMIN 2.9* 2.9* 2.9*   Recent Labs  Lab 11/20/23 1830  LIPASE 36   No results for input(s): AMMONIA in the last 168 hours. Diabetic: Recent Labs    11/21/23 1213  HGBA1C 6.4*   Recent Labs  Lab 11/20/23 1818 11/21/23 1307 11/21/23 2139 11/22/23 0753 11/22/23 1122  GLUCAP 121* 126* 99 195* 113*   Cardiac Enzymes: Recent Labs  Lab 11/21/23 0004  CKTOTAL 69   No results for input(s): PROBNP in the last 8760 hours. Coagulation Profile: No results for input(s): INR, PROTIME in the last 168 hours. Thyroid  Function Tests: Recent Labs    11/21/23 0004  TSH 1.517   Lipid Profile: No results for input(s): CHOL, HDL, LDLCALC, TRIG, CHOLHDL, LDLDIRECT in the last 72 hours. Anemia Panel: Recent Labs    11/21/23 0004  VITAMINB12 <150*  FOLATE 10.9  FERRITIN 102  TIBC 242*  IRON 31*  RETICCTPCT 1.1   Urine analysis:    Component Value Date/Time   COLORURINE YELLOW 11/21/2023 0125   APPEARANCEUR HAZY (A) 11/21/2023 0125   APPEARANCEUR Clear 05/05/2023 1122   LABSPEC 1.009 11/21/2023 0125   PHURINE 6.0 11/21/2023 0125   GLUCOSEU NEGATIVE 11/21/2023 0125   HGBUR SMALL (A) 11/21/2023 0125   BILIRUBINUR NEGATIVE 11/21/2023 0125   BILIRUBINUR Negative 05/05/2023 1122   KETONESUR NEGATIVE 11/21/2023 0125   PROTEINUR NEGATIVE 11/21/2023 0125   NITRITE NEGATIVE 11/21/2023 0125   LEUKOCYTESUR LARGE (A) 11/21/2023 0125   Sepsis Labs: Invalid input(s): PROCALCITONIN, LACTICIDVEN   SIGNED:  Krystale Rinkenberger T Climmie Buelow,  MD  [facility] 11/22/2023, 11:51 AM

## 2023-11-24 ENCOUNTER — Telehealth: Payer: Self-pay | Admitting: Urology

## 2023-11-24 NOTE — Telephone Encounter (Signed)
 Called pt daughter and had pt scheduled for 09/29 for renal us  review.

## 2023-11-24 NOTE — Telephone Encounter (Signed)
 Patient called into the office today with general questions/concerns regarding hospital follow up. Patient was in hospital and needs to see the urologist sooner than January Patient may be reached at [phone] to discuss questions.

## 2023-11-24 NOTE — Telephone Encounter (Signed)
 Pt sick at time of previous scheduled appt   gave blood 09/18 he got nauseas passed out and threw up right after and over the following two days pt daughter states US  renal needs to be reviewed per hospital f/u

## 2023-11-25 DIAGNOSIS — E1165 Type 2 diabetes mellitus with hyperglycemia: Secondary | ICD-10-CM | POA: Diagnosis not present

## 2023-11-25 DIAGNOSIS — N179 Acute kidney failure, unspecified: Secondary | ICD-10-CM | POA: Diagnosis not present

## 2023-11-25 DIAGNOSIS — E1140 Type 2 diabetes mellitus with diabetic neuropathy, unspecified: Secondary | ICD-10-CM | POA: Diagnosis not present

## 2023-11-25 DIAGNOSIS — Z6836 Body mass index (BMI) 36.0-36.9, adult: Secondary | ICD-10-CM | POA: Diagnosis not present

## 2023-11-25 DIAGNOSIS — I129 Hypertensive chronic kidney disease with stage 1 through stage 4 chronic kidney disease, or unspecified chronic kidney disease: Secondary | ICD-10-CM | POA: Diagnosis not present

## 2023-11-25 DIAGNOSIS — D631 Anemia in chronic kidney disease: Secondary | ICD-10-CM | POA: Diagnosis not present

## 2023-11-25 DIAGNOSIS — N1831 Chronic kidney disease, stage 3a: Secondary | ICD-10-CM | POA: Diagnosis not present

## 2023-11-25 DIAGNOSIS — E1122 Type 2 diabetes mellitus with diabetic chronic kidney disease: Secondary | ICD-10-CM | POA: Diagnosis not present

## 2023-11-26 DIAGNOSIS — D509 Iron deficiency anemia, unspecified: Secondary | ICD-10-CM | POA: Diagnosis not present

## 2023-11-26 DIAGNOSIS — E1165 Type 2 diabetes mellitus with hyperglycemia: Secondary | ICD-10-CM | POA: Diagnosis not present

## 2023-11-26 DIAGNOSIS — E1122 Type 2 diabetes mellitus with diabetic chronic kidney disease: Secondary | ICD-10-CM | POA: Diagnosis not present

## 2023-11-26 DIAGNOSIS — N183 Chronic kidney disease, stage 3 unspecified: Secondary | ICD-10-CM | POA: Diagnosis not present

## 2023-11-26 DIAGNOSIS — Z6836 Body mass index (BMI) 36.0-36.9, adult: Secondary | ICD-10-CM | POA: Diagnosis not present

## 2023-11-26 DIAGNOSIS — I129 Hypertensive chronic kidney disease with stage 1 through stage 4 chronic kidney disease, or unspecified chronic kidney disease: Secondary | ICD-10-CM | POA: Diagnosis not present

## 2023-11-26 DIAGNOSIS — N1330 Unspecified hydronephrosis: Secondary | ICD-10-CM | POA: Diagnosis not present

## 2023-11-26 DIAGNOSIS — E538 Deficiency of other specified B group vitamins: Secondary | ICD-10-CM | POA: Diagnosis not present

## 2023-11-26 DIAGNOSIS — D631 Anemia in chronic kidney disease: Secondary | ICD-10-CM | POA: Diagnosis not present

## 2023-11-26 DIAGNOSIS — N179 Acute kidney failure, unspecified: Secondary | ICD-10-CM | POA: Diagnosis not present

## 2023-11-26 DIAGNOSIS — N400 Enlarged prostate without lower urinary tract symptoms: Secondary | ICD-10-CM | POA: Diagnosis not present

## 2023-11-26 DIAGNOSIS — N1831 Chronic kidney disease, stage 3a: Secondary | ICD-10-CM | POA: Diagnosis not present

## 2023-11-26 DIAGNOSIS — E1140 Type 2 diabetes mellitus with diabetic neuropathy, unspecified: Secondary | ICD-10-CM | POA: Diagnosis not present

## 2023-12-01 ENCOUNTER — Ambulatory Visit: Admitting: Urology

## 2023-12-01 ENCOUNTER — Encounter: Payer: Self-pay | Admitting: Urology

## 2023-12-01 VITALS — BP 114/62 | HR 96

## 2023-12-01 DIAGNOSIS — R338 Other retention of urine: Secondary | ICD-10-CM

## 2023-12-01 DIAGNOSIS — N179 Acute kidney failure, unspecified: Secondary | ICD-10-CM

## 2023-12-01 DIAGNOSIS — N401 Enlarged prostate with lower urinary tract symptoms: Secondary | ICD-10-CM

## 2023-12-01 DIAGNOSIS — R3914 Feeling of incomplete bladder emptying: Secondary | ICD-10-CM | POA: Diagnosis not present

## 2023-12-01 DIAGNOSIS — N1339 Other hydronephrosis: Secondary | ICD-10-CM | POA: Diagnosis not present

## 2023-12-01 LAB — MICROSCOPIC EXAMINATION: WBC, UA: >30 /HPF — AB (ref 0–5)

## 2023-12-01 LAB — URINALYSIS, ROUTINE W REFLEX MICROSCOPIC
Bilirubin, UA: NEGATIVE
Glucose, UA: NEGATIVE
Ketones, UA: NEGATIVE
Nitrite, UA: NEGATIVE
Specific Gravity, UA: 1.01 (ref 1.005–1.030)
Urobilinogen, Ur: 0.2 mg/dL (ref 0.2–1.0)
pH, UA: 6.5 (ref 5.0–7.5)

## 2023-12-01 LAB — BLADDER SCAN AMB NON-IMAGING: Scan Result: 150

## 2023-12-01 NOTE — Progress Notes (Signed)
 Bladder Scan completed today.  Patient can void prior to the bladder scan. Bladder scan result: 150  Performed By: [facility] LPN

## 2023-12-01 NOTE — Progress Notes (Signed)
 12/01/2023 11:40 AM   Angel Norman 1932-11-13 [id number]  Referring provider: Loreli Kins, MD [address] [place],  [place] [address]  No chief complaint on file.   HPI:  Follow up-  1) BPH - followed for years by Dr. Matilda in [place]. On tamsulosin . More incontinence. Urgency. Has a weak stream. On tolterodine recently which hasn't helped. He has dry mouth. He also took abx with no change in incontinence. He stopped Jardiance . PVR 274 ml. No constipation. No NG risk. He is a good water drinker. In Oct 2024 we stopped tolterodine and started finasteride  due to inc LUTS, incontinence and higher PVR. He has a urinal or pot he voids in at night. Daughter not sure if he is missing or has incontinence. He wont wear adult briefs.     2) bacteriuria - long standing for many years. A 2016 CT was benign with mild BPH.    3) PSA elevation - PSA was 6.5 in 2009 and 4.05 in 2010. A 2016 CT was benign.    Today, seen for the above. On tams, finast. Admitted 11/20/2023 - cr 3.3 up from 0.88. Assoc with N/V. Sep 2025 renal US  - mild-to-mod left-sided hydronephrosis. The right renal pelvis is also fluid-filled and dilated without hydronephrosis. On the day of discharge, creatinine remained elevated at 3.2 suggesting progressive CKD versus AKI.  Excellent urine output.   PVR today 150 mL.   He was maintenance at schools. 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     PMH: Past Medical History:  Diagnosis Date   Arthritis    GERD (gastroesophageal reflux disease)    Hyperlipidemia    Rib fractures 12/06/2014    Surgical History: Past Surgical History:  Procedure Laterality Date   ESOPHAGOGASTRODUODENOSCOPY (EGD) WITH PROPOFOL  N/A 05/08/2018   Procedure: ESOPHAGOGASTRODUODENOSCOPY (EGD) WITH PROPOFOL ;  Surgeon: Golda Claudis PENNER, MD;  Location: [facility];  Service: Endoscopy;  Laterality: N/A;   goiter removal     INCISION AND DRAINAGE  Left 04/24/2021   Procedure: tenosynovectomy, left ankle joint aspiration;  Surgeon: Margrette Taft BRAVO, MD;  Location: [facility] ORS;  Service: Orthopedics;  Laterality: Left;   ORIF ANKLE FRACTURE Left 09/13/2020   Procedure: OPEN REDUCTION INTERNAL FIXATION (ORIF) LEFT ANKLE FRACTURE;  Surgeon: Margrette Taft BRAVO, MD;  Location: [facility];  Service: Orthopedics;  Laterality: Left;   TOTAL KNEE ARTHROPLASTY Left 08/18/2012   Dr Harden   TOTAL KNEE ARTHROPLASTY Left 08/19/2012   Procedure: TOTAL KNEE ARTHROPLASTY;  Surgeon: Jerona LULLA Harden, MD;  Location[facility] OR;  Service: Orthopedics;  Laterality: Left;  Left Total Knee Arthroplasty    Home Medications:  Allergies as of 12/01/2023   No Known Allergies      Medication List        Accurate as of December 01, 2023 11:40 AM. If you have any questions, ask your nurse or doctor.          acetaminophen  325 MG tablet Commonly known as: TYLENOL  Take 2 tablets (650 mg total) by mouth every 6 (six) hours.   albuterol  108 (90 Base) MCG/ACT inhaler Commonly known as: VENTOLIN  HFA Inhale 1 puff into the lungs in the morning and at bedtime.   cyanocobalamin  1000 MCG tablet Take 1 tablet (1,000 mcg total) by mouth daily.   ferrous sulfate  325 (65 FE) MG EC tablet Take 325 mg by mouth daily with breakfast.   finasteride  5 MG tablet Commonly known  as: PROSCAR  Take 1 tablet (5 mg total) by mouth daily.   multivitamin tablet Take 1 tablet by mouth daily.   pantoprazole  40 MG tablet Commonly known as: PROTONIX  Take 1 tablet (40 mg total) by mouth daily.   polyethylene glycol 17 g packet Commonly known as: MIRALAX  / GLYCOLAX  Take 17 g by mouth daily as needed for mild constipation.   pravastatin  80 MG tablet Commonly known as: PRAVACHOL  Take 80 mg by mouth daily.   Rybelsus 7 MG Tabs Generic drug: Semaglutide Take 7 mg by mouth daily.   tamsulosin  0.4 MG Caps capsule Commonly known as: FLOMAX  Take 0.4 mg by mouth daily.         Allergies: No Known Allergies  Family History: Family History  Family history unknown: Yes    Social History:  reports that he has never smoked. He has never used smokeless tobacco. He reports that he does not drink alcohol and does not use drugs.   Physical Exam: BP 114/62   Pulse 96   Constitutional:  Alert and oriented, No acute distress. HEENT: [place] AT, moist mucus membranes.  Trachea midline, no masses. Cardiovascular: No clubbing, cyanosis, or edema. Respiratory: Normal respiratory effort, no increased work of breathing. GI: Abdomen is soft, nontender, nondistended, no abdominal masses GU: No CVA tenderness Skin: No rashes, bruises or suspicious lesions. Neurologic: Grossly intact, no focal deficits, moving all 4 extremities. Psychiatric: Normal mood and affect.  Laboratory Data: Lab Results  Component Value Date   WBC 8.5 11/22/2023   HGB 8.6 (L) 11/22/2023   HCT 28.3 (L) 11/22/2023   MCV 95.6 11/22/2023   PLT 210 11/22/2023    Lab Results  Component Value Date   CREATININE 3.24 (H) 11/22/2023    No results found for: PSA  No results found for: TESTOSTERONE  Lab Results  Component Value Date   HGBA1C 6.4 (H) 11/21/2023    Urinalysis    Component Value Date/Time   COLORURINE YELLOW 11/21/2023 0125   APPEARANCEUR HAZY (A) 11/21/2023 0125   APPEARANCEUR Clear 05/05/2023 1122   LABSPEC 1.009 11/21/2023 0125   PHURINE 6.0 11/21/2023 0125   GLUCOSEU NEGATIVE 11/21/2023 0125   HGBUR SMALL (A) 11/21/2023 0125   BILIRUBINUR NEGATIVE 11/21/2023 0125   BILIRUBINUR Negative 05/05/2023 1122   KETONESUR NEGATIVE 11/21/2023 0125   PROTEINUR NEGATIVE 11/21/2023 0125   NITRITE NEGATIVE 11/21/2023 0125   LEUKOCYTESUR LARGE (A) 11/21/2023 0125    Lab Results  Component Value Date   LABMICR See below: 05/05/2023   WBCUA >30 (H) 05/05/2023   LABEPIT 0-10 05/05/2023   BACTERIA RARE (A) 11/21/2023    Pertinent Imaging: Independently reviewed  images   Results for orders placed during the hospital encounter of 11/20/23  US  RENAL  Narrative CLINICAL DATA:  [id number] AKI (acute kidney injury) (HCC) [id number]  EXAM: RENAL / URINARY TRACT ULTRASOUND COMPLETE  COMPARISON:  12/06/2014  FINDINGS: Technically difficult exam due to patient's body habitus and overlying bowel gas.  Right Kidney:  Renal measurements: 11.9 x 6.4 x 6.6 cm = volume: 259 mL.Normal echogenicity. Lower pole cyst measuring 3 cm. Moderate pelviectasis without hydronephrosis or nephrolithiasis.  Left Kidney:  Renal measurements: 11.2 x 5.5 x 6.4 cm = volume: 5 4 mL. Normal echogenicity. No mass. Apparent mild-to-moderate hydronephrosis. No nephrolithiasis.  Bladder:  Partially distended, with trabeculation of the bladder wall. Findings suggestive of BPH present.  Other:  None.  IMPRESSION: 1. Apparent mild-to-moderate left-sided hydronephrosis. The right renal pelvis is also fluid-filled and dilated  without hydronephrosis. 2. Findings suggestive of BPH present. Trabeculation of the bladder wall, may reflect changes of chronic bladder outlet obstruction.   Electronically Signed By: Rogelia Myers M.D. On: 11/21/2023 15:58    Assessment & Plan:    1. Benign prostatic hyperplasia with incomplete bladder emptying (Primary) Cont max medical therapy. Discussed foley or CIC for inc emptying. Nocturia. Discussed management of nocturia.  - Urinalysis, Routine w reflex microscopic - BLADDER SCAN AMB NON-IMAGING  2.  Hydronephrosis, AKI-PVR reasonable but still may be related to reflux.  He only had a renal ultrasound and he has no prior upper tract imaging.  Will check a CT scan of the abdomen and pelvis without contrast to better outline his anatomy and consider other interventions.  We discussed the nature risk benefits and alternatives to cystoscopy with retrograde and stent placement.  Seen today with his daughter.   No follow-ups on  file.  Donnice Brooks, MD  [facility]  [address] [place], [place] [address] [phone]

## 2023-12-02 ENCOUNTER — Other Ambulatory Visit (HOSPITAL_COMMUNITY): Payer: Self-pay | Admitting: Pharmacy Technician

## 2023-12-02 ENCOUNTER — Telehealth (HOSPITAL_COMMUNITY): Payer: Self-pay | Admitting: Pharmacy Technician

## 2023-12-02 DIAGNOSIS — D509 Iron deficiency anemia, unspecified: Secondary | ICD-10-CM | POA: Insufficient documentation

## 2023-12-02 NOTE — Telephone Encounter (Signed)
 Auth Submission: APPROVED Site of care: [facility] Payer: [employer] MEDICARE Medication & CPT/J Code(s) submitted: Feraheme (ferumoxytol) [id number] Diagnosis Code: D50.9 Route of submission (phone, fax, portal): MD office submitted auth. Phone # Fax # Auth type: Buy/Bill HB Units/visits requested: 510mg  x 2 doses Reference number: [id number] Approval from: 12/01/23 to 03/03/24  Called [employer]: [facility] facility NPI added to auth and requested approval letter be faxed.    Lucciana Head, CPhT [facility] Phone: [phone] 12/02/2023

## 2023-12-05 DIAGNOSIS — R112 Nausea with vomiting, unspecified: Secondary | ICD-10-CM | POA: Diagnosis not present

## 2023-12-05 DIAGNOSIS — N179 Acute kidney failure, unspecified: Secondary | ICD-10-CM | POA: Diagnosis not present

## 2023-12-05 DIAGNOSIS — N184 Chronic kidney disease, stage 4 (severe): Secondary | ICD-10-CM | POA: Diagnosis not present

## 2023-12-05 DIAGNOSIS — N1330 Unspecified hydronephrosis: Secondary | ICD-10-CM | POA: Diagnosis not present

## 2023-12-05 DIAGNOSIS — D509 Iron deficiency anemia, unspecified: Secondary | ICD-10-CM | POA: Diagnosis not present

## 2023-12-08 ENCOUNTER — Ambulatory Visit (HOSPITAL_COMMUNITY)
Admission: RE | Admit: 2023-12-08 | Discharge: 2023-12-08 | Disposition: A | Discharge status: Home / Self Care | Source: Ambulatory Visit | Attending: Urology | Admitting: Urology

## 2023-12-08 DIAGNOSIS — N179 Acute kidney failure, unspecified: Secondary | ICD-10-CM | POA: Diagnosis not present

## 2023-12-08 DIAGNOSIS — K5730 Diverticulosis of large intestine without perforation or abscess without bleeding: Secondary | ICD-10-CM | POA: Diagnosis not present

## 2023-12-08 DIAGNOSIS — N1339 Other hydronephrosis: Secondary | ICD-10-CM | POA: Insufficient documentation

## 2023-12-08 DIAGNOSIS — N1330 Unspecified hydronephrosis: Secondary | ICD-10-CM | POA: Diagnosis not present

## 2023-12-08 DIAGNOSIS — N400 Enlarged prostate without lower urinary tract symptoms: Secondary | ICD-10-CM | POA: Diagnosis not present

## 2023-12-09 ENCOUNTER — Telehealth: Payer: Self-pay | Admitting: Urology

## 2023-12-09 ENCOUNTER — Ambulatory Visit (HOSPITAL_COMMUNITY)
Admission: RE | Admit: 2023-12-09 | Discharge: 2023-12-09 | Disposition: A | Discharge status: Home / Self Care | Source: Ambulatory Visit | Attending: Family Medicine | Admitting: Family Medicine

## 2023-12-09 VITALS — BP 150/88 | HR 91 | Temp 97.6°F | Resp 17

## 2023-12-09 DIAGNOSIS — D509 Iron deficiency anemia, unspecified: Secondary | ICD-10-CM | POA: Insufficient documentation

## 2023-12-09 MED ORDER — SODIUM CHLORIDE 0.9 % IV SOLN
510.0000 mg | Freq: Once | INTRAVENOUS | Status: AC
  Administered 2023-12-09: 510 mg via INTRAVENOUS
  Filled 2023-12-09: qty 510

## 2023-12-09 NOTE — Telephone Encounter (Signed)
 Return call pt/daughter making them aware radiology has not read the ct as of yet but once read we will routed CT to MD, Eskridge and reach back out with MD recommendations. Verbalized understanding.

## 2023-12-09 NOTE — Telephone Encounter (Signed)
 Patient returned call requesting CT results results.   Test was completed on 12/08/2023   Best number to contact patient [phone] Call transferred to clinical basket.

## 2023-12-11 ENCOUNTER — Emergency Department (HOSPITAL_COMMUNITY)

## 2023-12-11 ENCOUNTER — Encounter (HOSPITAL_COMMUNITY): Payer: Self-pay | Admitting: *Deleted

## 2023-12-11 ENCOUNTER — Emergency Department (HOSPITAL_COMMUNITY)
Admission: EM | Admit: 2023-12-11 | Discharge: 2023-12-11 | Disposition: A | Discharge status: Home / Self Care | Source: Ambulatory Visit | Attending: Emergency Medicine | Admitting: Emergency Medicine

## 2023-12-11 ENCOUNTER — Other Ambulatory Visit: Payer: Self-pay

## 2023-12-11 DIAGNOSIS — Z79899 Other long term (current) drug therapy: Secondary | ICD-10-CM | POA: Insufficient documentation

## 2023-12-11 DIAGNOSIS — K5900 Constipation, unspecified: Secondary | ICD-10-CM | POA: Insufficient documentation

## 2023-12-11 DIAGNOSIS — N178 Other acute kidney failure: Secondary | ICD-10-CM | POA: Diagnosis not present

## 2023-12-11 DIAGNOSIS — R809 Proteinuria, unspecified: Secondary | ICD-10-CM | POA: Diagnosis not present

## 2023-12-11 DIAGNOSIS — I878 Other specified disorders of veins: Secondary | ICD-10-CM | POA: Diagnosis not present

## 2023-12-11 DIAGNOSIS — E1122 Type 2 diabetes mellitus with diabetic chronic kidney disease: Secondary | ICD-10-CM | POA: Diagnosis not present

## 2023-12-11 DIAGNOSIS — N1832 Chronic kidney disease, stage 3b: Secondary | ICD-10-CM | POA: Diagnosis not present

## 2023-12-11 DIAGNOSIS — R9431 Abnormal electrocardiogram [ECG] [EKG]: Secondary | ICD-10-CM | POA: Diagnosis not present

## 2023-12-11 HISTORY — DX: Disorder of kidney and ureter, unspecified: N28.9

## 2023-12-11 HISTORY — DX: Benign prostatic hyperplasia without lower urinary tract symptoms: N40.0

## 2023-12-11 LAB — COMPREHENSIVE METABOLIC PANEL WITH GFR
ALT: 12 U/L (ref 0–44)
AST: 16 U/L (ref 15–41)
Albumin: 4 g/dL (ref 3.5–5.0)
Alkaline Phosphatase: 89 U/L (ref 38–126)
Anion gap: 12 (ref 5–15)
BUN: 55 mg/dL — ABNORMAL HIGH (ref 8–23)
CO2: 26 mmol/L (ref 22–32)
Calcium: 9.4 mg/dL (ref 8.9–10.3)
Chloride: 100 mmol/L (ref 98–111)
Creatinine, Ser: 3.6 mg/dL — ABNORMAL HIGH (ref 0.61–1.24)
GFR, Estimated: 16 mL/min — ABNORMAL LOW (ref >60)
Glucose, Bld: 107 mg/dL — ABNORMAL HIGH (ref 70–99)
Potassium: 4.9 mmol/L (ref 3.5–5.1)
Sodium: 138 mmol/L (ref 135–145)
Total Bilirubin: 0.4 mg/dL (ref 0.0–1.2)
Total Protein: 7.8 g/dL (ref 6.5–8.1)

## 2023-12-11 LAB — CBC WITH DIFFERENTIAL/PLATELET
Abs Immature Granulocytes: 0.05 K/uL (ref 0.00–0.07)
Basophils Absolute: 0 K/uL (ref 0.0–0.1)
Basophils Relative: 0 %
Eosinophils Absolute: 0.1 K/uL (ref 0.0–0.5)
Eosinophils Relative: 2 %
HCT: 32.6 % — ABNORMAL LOW (ref 39.0–52.0)
Hemoglobin: 10 g/dL — ABNORMAL LOW (ref 13.0–17.0)
Immature Granulocytes: 1 %
Lymphocytes Relative: 12 %
Lymphs Abs: 1 K/uL (ref 0.7–4.0)
MCH: 30.1 pg (ref 26.0–34.0)
MCHC: 30.7 g/dL (ref 30.0–36.0)
MCV: 98.2 fL (ref 80.0–100.0)
Monocytes Absolute: 0.5 K/uL (ref 0.1–1.0)
Monocytes Relative: 7 %
Neutro Abs: 6.2 K/uL (ref 1.7–7.7)
Neutrophils Relative %: 78 %
Platelets: 243 K/uL (ref 150–400)
RBC: 3.32 MIL/uL — ABNORMAL LOW (ref 4.22–5.81)
RDW: 16.1 % — ABNORMAL HIGH (ref 11.5–15.5)
WBC: 7.9 K/uL (ref 4.0–10.5)
nRBC: 0 % (ref 0.0–0.2)

## 2023-12-11 LAB — URINALYSIS, ROUTINE W REFLEX MICROSCOPIC
Bilirubin Urine: NEGATIVE
Glucose, UA: NEGATIVE mg/dL
Hgb urine dipstick: NEGATIVE
Ketones, ur: NEGATIVE mg/dL
Nitrite: NEGATIVE
Protein, ur: 30 mg/dL — AB
Specific Gravity, Urine: 1.013 (ref 1.005–1.030)
pH: 6 (ref 5.0–8.0)

## 2023-12-11 LAB — CBG MONITORING, ED: Glucose-Capillary: 111 mg/dL — ABNORMAL HIGH (ref 70–99)

## 2023-12-11 MED ORDER — HYDROCORTISONE (PERIANAL) 2.5 % EX CREA: 1.0000 | TOPICAL_CREAM | Freq: Two times a day (BID) | CUTANEOUS | 0 refills | Status: DC

## 2023-12-11 MED ORDER — SMOG ENEMA
960.0000 mL | Freq: Once | RECTAL | Status: AC
  Administered 2023-12-11: 960 mL via RECTAL
  Filled 2023-12-11: qty 960

## 2023-12-11 MED ORDER — PEG 3350-KCL-NA BICARB-NACL 420 G PO SOLR: 4000.0000 mL | Freq: Once | ORAL | 0 refills | Status: DC

## 2023-12-11 MED ORDER — LIDOCAINE HCL URETHRAL/MUCOSAL 2 % EX GEL
1.0000 | Freq: Once | CUTANEOUS | Status: DC
  Filled 2023-12-11: qty 5

## 2023-12-11 MED ORDER — PEG 3350-KCL-NA BICARB-NACL 420 G PO SOLR: 4000.0000 mL | Freq: Once | ORAL | 0 refills | Status: AC

## 2023-12-11 NOTE — ED Notes (Signed)
 Pt ambulated well with cane approx 30 yards with steady gait.  PA aware.

## 2023-12-11 NOTE — ED Triage Notes (Addendum)
 Reported that pt with no BM for 7 days, sent here for an enema. Denies hx of constipation.   Unable to obtain oral temp in triage.

## 2023-12-11 NOTE — Discharge Instructions (Addendum)
 Your workup today shows significant constipation.  Some of your constipation has resolved here but you still have stool within your colon.  Take the prescription medication as directed.  I have also provided a prescription for a cream that you may apply rectally if needed for discomfort.  Please call your primary care provider to arrange follow-up appointment.  Return to emergency department for any new or worsening symptoms.

## 2023-12-11 NOTE — ED Notes (Signed)
 Daughter called nurse in the room, stated her father was moving his hand in a jerking movement.Noted patient sitting in the chair. Alert and oriented x 3. Following commands appropriately.  Vital signs stable. CBG 111. No acute distress  noted. Assisted patient in bed. Provided safety. Will continue to monitor.

## 2023-12-11 NOTE — ED Provider Notes (Signed)
 [place] EMERGENCY DEPARTMENT AT [facility] Provider Note   CSN: [id number] Arrival date & time: 12/11/23  1122     Patient presents with: Constipation   Angel Norman is a 89 y.o. male.    Constipation Associated symptoms: abdominal pain, nausea and vomiting   Associated symptoms: no back pain, no dysuria and no fever        Angel Norman is a 89 y.o. male medical history of GERD, CKD-3, type 2 diabetes, anemia, who presents to the Emergency Department after evaluation by nephrology earlier today recommended to come here for evaluation of his constipation and possible enema.  Patient was admitted to the hospital in late September after having syncopal episode while giving blood and found to have AKI.  He is also currently being followed by urology for BPH and trabeculation of the bladder.  He was seen by nephrology earlier today and was noted to have constipation with last bowel movement 7 days ago.  Patient and daughter at bedside states that he has tried multiple over-the-counter stool softeners and laxatives without relief.  He does endorse having some intermittent discomfort of his lower abdomen and feeling bloated.  He has urges to defecate and has flatus but unable to move his bowels.  He does endorse some nausea and intermittent vomiting, but states that he was recently taking off of his PPI due to his declining kidney function and believes that his nausea and vomiting symptoms are related to his GERD.  Denies any chest pain, shortness of breath or peripheral edema.  Of note, pt had CT abd/pelvis without contrast 3 days ago without evidence of bowel dilation  Prior to Admission medications   Medication Sig Start Date End Date Taking? Authorizing Provider  acetaminophen  (TYLENOL ) 325 MG tablet Take 2 tablets (650 mg total) by mouth every 6 (six) hours. 09/15/20   Johnson, Clanford L, MD  albuterol  (VENTOLIN  HFA) 108 (90 Base) MCG/ACT inhaler Inhale 1 puff into the lungs  in the morning and at bedtime. 04/21/23   [provider]  cyanocobalamin  1000 MCG tablet Take 1 tablet (1,000 mcg total) by mouth daily. 11/23/23   Gonfa, Taye T, MD  ferrous sulfate  325 (65 FE) MG EC tablet Take 325 mg by mouth daily with breakfast.    [provider]  finasteride  (PROSCAR ) 5 MG tablet Take 1 tablet (5 mg total) by mouth daily. 12/23/22   Nieves Cough, MD  Multiple Vitamin (MULTIVITAMIN) tablet Take 1 tablet by mouth daily.    [provider]  pantoprazole  (PROTONIX ) 40 MG tablet Take 1 tablet (40 mg total) by mouth daily. 11/22/23   Gonfa, Taye T, MD  polyethylene glycol (MIRALAX  / GLYCOLAX ) 17 g packet Take 17 g by mouth daily as needed for mild constipation. 04/26/21   Maree, Pratik D, DO  pravastatin  (PRAVACHOL ) 80 MG tablet Take 80 mg by mouth daily.    [provider]  RYBELSUS 7 MG TABS Take 7 mg by mouth daily. 09/18/23   [provider]  tamsulosin  (FLOMAX ) 0.4 MG CAPS capsule Take 0.4 mg by mouth daily.    [provider]    Allergies: Patient has no known allergies.    Review of Systems  Constitutional:  Negative for appetite change, chills and fever.  Respiratory:  Negative for cough and shortness of breath.   Cardiovascular:  Negative for chest pain and leg swelling.  Gastrointestinal:  Positive for abdominal pain, constipation, nausea and vomiting.  Genitourinary:  Negative  for dysuria.  Musculoskeletal:  Negative for back pain.  Neurological:  Negative for dizziness, weakness, numbness and headaches.    Updated Vital Signs BP 134/69 (BP Location: Left Arm)   Pulse 74   Temp (!) 97.1 F (36.2 C) (Temporal)   Resp 20   Ht 6' 3 (1.905 m)   Wt 129.3 kg   SpO2 95%   BMI 35.62 kg/m   Physical Exam Vitals and nursing note reviewed.  Constitutional:      General: He is not in acute distress.    Appearance: Normal appearance. He is not ill-appearing or toxic-appearing.  HENT:     Mouth/Throat:      Mouth: Mucous membranes are moist.  Cardiovascular:     Rate and Rhythm: Normal rate and regular rhythm.     Pulses: Normal pulses.  Pulmonary:     Effort: Pulmonary effort is normal.     Breath sounds: Normal breath sounds.  Abdominal:     General: There is distension.     Palpations: Abdomen is soft. There is no mass.     Tenderness: There is no abdominal tenderness. There is no guarding.  Musculoskeletal:     Right lower leg: No edema.     Left lower leg: No edema.  Skin:    General: Skin is warm.     Capillary Refill: Capillary refill takes less than 2 seconds.  Neurological:     General: No focal deficit present.     Mental Status: He is alert.     Sensory: No sensory deficit.     Motor: No weakness.     (all labs ordered are listed, but only abnormal results are displayed) Labs Reviewed  COMPREHENSIVE METABOLIC PANEL WITH GFR - Abnormal; Notable for the following components:      Result Value   Glucose, Bld 107 (*)    BUN 55 (*)    Creatinine, Ser 3.60 (*)    GFR, Estimated 16 (*)    All other components within normal limits  CBC WITH DIFFERENTIAL/PLATELET - Abnormal; Notable for the following components:   RBC 3.32 (*)    Hemoglobin 10.0 (*)    HCT 32.6 (*)    RDW 16.1 (*)    All other components within normal limits  URINALYSIS, ROUTINE W REFLEX MICROSCOPIC    EKG: EKG Interpretation Date/Time:  Thursday December 11 2023 20:38:34 EDT Ventricular Rate:  77 PR Interval:    QRS Duration:  142 QT Interval:  444 QTC Calculation: 503 R Axis:   -28  Text Interpretation: No significant change since Left bundle branch block Confirmed by Cleotilde Rogue ([id number]) on 12/11/2023 8:43:03 PM  Radiology: ARCOLA Abdomen 1 View Result Date: 12/11/2023 CLINICAL DATA:  Constipation. EXAM: ABDOMEN - 1 VIEW COMPARISON:  11/28/2020. FINDINGS: Nonobstructive bowel-gas pattern. Moderate volume of stool within the right colon. No radiopaque calculi identified. Pelvic phleboliths.  Moderate degenerative changes of the bilateral hips. Degenerative changes of the visualized lower lumbar spine. IMPRESSION: Nonobstructive bowel-gas pattern. Moderate volume of stool within the right colon. Electronically Signed   By: Harrietta Sherry M.D.   On: 12/11/2023 14:29     .Fecal disimpaction  Date/Time: 12/11/2023 7:00 PM  Performed by: Herlinda Milling, PA-C Authorized by: Herlinda Milling, PA-C  Consent: Verbal consent obtained Consent given by: patient Patient understanding: patient states understanding of the procedure being performed Imaging studies: imaging studies available Patient identity confirmed: verbally with patient Local anesthesia used: no  Anesthesia: Local anesthesia used: no  Sedation:  Patient sedated: no  Comments: Partial fecal disimpaction performed successfully by me.  Patient tolerated procedure well      Medications Ordered in the ED - No data to display                                  Medical Decision Making Patient here for evaluation of constipation and possible enema.  Seen earlier today by nephrology and recommended to come here for enema.  Patient is history of stage III CKD and also followed by urology for BPH and trabeculation of the bladder.  Last bowel movement 7 days ago, unrelieved with over-the-counter stool softeners and laxatives.  Endorses urges to defecate but unable to move his bowels.  Also endorses intermittent nausea and vomiting usually postprandial and states he was recently taken off of his GERD medication due to his declining kidney function.  Differential would include but not limited to constipation, SBO, neoplasm  Amount and/or Complexity of Data Reviewed Labs: ordered.    Details: No leukocytosis, hemoglobin low at 10 but much improved from 2 weeks ago.  Urinalysis shows moderate leukocytes 11-20 WBCs with rare bacteria.  Patient denies having any urinary symptoms.  Chemistries show renal insufficiency near  patient's baseline Radiology: ordered.    Details: 1 view of the abdomen shows nonobstructive bowel gas pattern with moderate volume of stool ECG/medicine tests: ordered.    Details: EKG shows no significant change.  Left bundle branch block Discussion of management or test interpretation with external provider(s): Patient here with workup today showing constipation.  No evidence of SBO.  He was given smog enema with some bowel movement.  On rectal exam, he had large amount of hard stool within the rectum, a fecal disimpaction was performed by me.  Discussed importance of proper diet including adequate hydration fruits and vegetables.  Will prescribe GoLytely  and he will follow-up with PCP.  Upon discharge, I was notified by nursing staff that patient's daughter that was at bedside called out requesting assistance.  She describes witnessing a brief , probable 10-second episode of weakness and slight shaking of both arms.  When I entered the room, patient was lying on the stretcher after standing up and transferring from bedside commode to stretcher.  Appeared slightly pale, he was not diaphoretic, he is answering questions appropriately, alert and oriented x 3.  Does not appear postictal on my exam.  CBG 111.  And vitals reassuring.  Has had limited food today and liquids.  I suspect this was vasovagal response.  Will obtain EKG give fluids and a snack and observe closely  On recheck, patient reports feeling much better.  Remains neurovascularly intact.  Joking and talking with staff.  He is ambulated in the department with steady gait.  This was likely a vasovagal response that occurred when he stood up from bedside commode to transfer to the stretcher.  I have discussed importance of adequate hydration he can start the GoLytely  tomorrow.     Risk Prescription drug management.        Final diagnoses:  Constipation, unspecified constipation type    ED Discharge Orders     None           Herlinda Milling, PA-C 12/12/23 GUILLERMO Dean Clarity, MD 12/16/23 [phone]

## 2023-12-11 NOTE — ED Notes (Signed)
 Received report from [place] RN. Per Tammy PA will monitor pt then recheck due to possible recent vagal episode.

## 2023-12-12 ENCOUNTER — Telehealth: Payer: Self-pay

## 2023-12-12 ENCOUNTER — Ambulatory Visit: Payer: Self-pay

## 2023-12-12 ENCOUNTER — Telehealth: Payer: Self-pay | Admitting: Urology

## 2023-12-12 ENCOUNTER — Other Ambulatory Visit: Payer: Self-pay

## 2023-12-12 ENCOUNTER — Ambulatory Visit: Admitting: Urology

## 2023-12-12 VITALS — BP 127/64 | HR 88

## 2023-12-12 DIAGNOSIS — R339 Retention of urine, unspecified: Secondary | ICD-10-CM | POA: Diagnosis not present

## 2023-12-12 DIAGNOSIS — N401 Enlarged prostate with lower urinary tract symptoms: Secondary | ICD-10-CM

## 2023-12-12 DIAGNOSIS — N179 Acute kidney failure, unspecified: Secondary | ICD-10-CM

## 2023-12-12 MED ORDER — TAMSULOSIN HCL 0.4 MG PO CAPS: 0.4000 mg | ORAL_CAPSULE | Freq: Two times a day (BID) | ORAL | 11 refills | Status: AC

## 2023-12-12 MED ORDER — CIPROFLOXACIN HCL 500 MG PO TABS
500.0000 mg | ORAL_TABLET | Freq: Once | ORAL | Status: AC
  Administered 2023-12-12: 500 mg via ORAL

## 2023-12-12 NOTE — Addendum Note (Signed)
 Addended by: SAMMIE EXIE HERO on: 12/12/2023 01:37 PM   Modules accepted: Orders

## 2023-12-12 NOTE — Telephone Encounter (Signed)
 Error

## 2023-12-12 NOTE — Telephone Encounter (Signed)
 Pt scheduled

## 2023-12-12 NOTE — Patient Instructions (Addendum)

## 2023-12-12 NOTE — Telephone Encounter (Signed)
 Called pt daughter to let her know per verbal from MD McKenzie pt can come in to have 116 Fr foley placed pt daughter voiced her understanding and will get here as soon as possible

## 2023-12-12 NOTE — Telephone Encounter (Signed)
 Nurse calling from [facility] office requesting pt have cath placed urgently due to CT results RN was notified MD has to authorize cath placement once advised by MD pt will be called and added to the schedule RN voiced her understanding

## 2023-12-12 NOTE — Progress Notes (Signed)
 Simple Catheter Placement  Due to urinary retention patient is present today for a foley cath placement.  Patient was cleaned and prepped in a sterile fashion with Betadinex3 . A 16 FR foley catheter was inserted, urine return was noted  600 ml, urine was Dark yellow in color.  The balloon was filled with 10cc of sterile water.  A BED bag was attached for drainage. Patient was also given a night bag to take home and was given instruction on how to change from one bag to another.  Patient was given instruction on proper catheter care.  Patient tolerated well, no complications were noted   Performed by: Exie DASEN. CMA  Follow up: 1 week per MD McKenzie for VT

## 2023-12-12 NOTE — Telephone Encounter (Signed)
 Pt's daughter called made aware CT was routed to MD, Eskridge for review.

## 2023-12-12 NOTE — Telephone Encounter (Signed)
 Angel Bash, NP called in today regarding pr coming in for a catheter placement today. NP made aware pt is scheduled for cath placement. Voiced understanding

## 2023-12-12 NOTE — Telephone Encounter (Signed)
 Daughter left message that Dr at [facility] said he needs a cath today. She would like a call back asap

## 2023-12-12 NOTE — Telephone Encounter (Signed)
 Pt's  daughter called in today with concerns about pt's CT. Pt's daughter state's that pt's PCP MD, Shaw review CT and state's pt need to be seen ASAP for cathter placement and it's  emergent for pt to bee seen ASAP. Pt's daughter is aware a message has been to MD, Eskridge  to review CT and give advisement. Pt daughter voiced understanding

## 2023-12-15 NOTE — Telephone Encounter (Signed)
 Return call to pt/daughter she state's that pt was feeling burning on the initial day of the cath placement and has not complain about burning since the initial cath placement. Pt's daughter was made aware pt can have labs done the same day of VT. Voiced understanding.

## 2023-12-15 NOTE — Telephone Encounter (Signed)
 Called pt daughter to let her know MD Eskridge recommendation pt daughter was given details on the recommended procedure and confirm f/u appt for pt

## 2023-12-15 NOTE — Telephone Encounter (Signed)
 Daughter left message patient had cath put in and is having burning pain

## 2023-12-16 ENCOUNTER — Ambulatory Visit (HOSPITAL_COMMUNITY)
Admission: RE | Admit: 2023-12-16 | Discharge: 2023-12-16 | Disposition: A | Discharge status: Home / Self Care | Source: Ambulatory Visit | Attending: Family Medicine | Admitting: Family Medicine

## 2023-12-16 ENCOUNTER — Telehealth: Payer: Self-pay

## 2023-12-16 VITALS — BP 121/64 | HR 82 | Temp 97.6°F | Resp 16

## 2023-12-16 DIAGNOSIS — D509 Iron deficiency anemia, unspecified: Secondary | ICD-10-CM | POA: Diagnosis not present

## 2023-12-16 MED ORDER — SODIUM CHLORIDE 0.9 % IV SOLN
510.0000 mg | Freq: Once | INTRAVENOUS | Status: AC
  Administered 2023-12-16: 510 mg via INTRAVENOUS
  Filled 2023-12-16: qty 510

## 2023-12-16 NOTE — Telephone Encounter (Signed)
 Pt is scheduled for cysto

## 2023-12-16 NOTE — Telephone Encounter (Signed)
-----   Message from Donnice Brooks sent at 12/15/2023 10:50 AM EDT ----- See other note - see me back for cystoscopy in 2-4 weeks. Thank you. ----- Message ----- From: Gretta Carlos SAUNDERS, CMA Sent: 12/12/2023  12:39 PM EDT To: Donnice Brooks, MD  Please review today office visited and advise

## 2023-12-17 ENCOUNTER — Ambulatory Visit

## 2023-12-17 DIAGNOSIS — R338 Other retention of urine: Secondary | ICD-10-CM | POA: Diagnosis not present

## 2023-12-17 DIAGNOSIS — N179 Acute kidney failure, unspecified: Secondary | ICD-10-CM | POA: Diagnosis not present

## 2023-12-17 DIAGNOSIS — N401 Enlarged prostate with lower urinary tract symptoms: Secondary | ICD-10-CM

## 2023-12-17 LAB — BLADDER VOIDING TRIAL: Scan Result: 135

## 2023-12-17 MED ORDER — CIPROFLOXACIN HCL 500 MG PO TABS
500.0000 mg | ORAL_TABLET | Freq: Once | ORAL | Status: AC
  Administered 2023-12-17: 500 mg via ORAL

## 2023-12-17 NOTE — Progress Notes (Cosign Needed Addendum)
 Fill and Pull Catheter Removal  Patient is present today for a catheter removal due to BPH with incomplete bladder emptying.  30 ml of sterile water was instilled into the bladder when the patient felt the urge to urinate. 10 ml of water was then drained from the balloon.  A 16 FR foley cath was removed from the bladder no complications were noted .  Foley catheter intact and time of removal. Patient as then given some time to void on their own.  Patient cannot void  0 ml on their own after some time due to multiple bladder spasms.  Per Dr. Sherrilee, pt cath can be pulled and pt is to return at 1:45 for PVR.  Patient tolerated well.  One oral prophylactic antibiotic given per MD orders  Performed by: Carlos, CMA  Follow up/ Additional notes: Pt to return @1 :45 for PVR   Bladder Scan completed today due to reason BPH with incomplete bladder empty  Patient cannot void prior to the bladder scan. Bladder scan result: 135  Simple Catheter Placement  Due to urinary retention patient is present today for a foley cath placement.  Patient was cleaned and prepped in a sterile fashion with Betadinex3 . A 16 FR foley catheter was inserted, urine return was noted  200 ml, urine was Clear yellow in color.  The balloon was filled with 10cc of sterile water.  A night  bag was attached for drainage. Patient was also given a night bag to take home and was given instruction on how to change from one bag to another.  Patient was given instruction on proper catheter care.  Patient tolerated well, no complications were noted   Performed by: Carlos, CMA  Additional notes/ Follow up:  1 week VT with nurse

## 2023-12-18 LAB — BUN+CREAT
BUN/Creatinine Ratio: 11 (ref 10–24)
BUN: 29 mg/dL — ABNORMAL HIGH (ref 8–27)
Creatinine, Ser: 2.69 mg/dL — ABNORMAL HIGH (ref 0.76–1.27)
eGFR: 22 mL/min/1.73 — ABNORMAL LOW (ref >59)

## 2023-12-19 ENCOUNTER — Ambulatory Visit: Payer: Self-pay

## 2023-12-19 DIAGNOSIS — N1339 Other hydronephrosis: Secondary | ICD-10-CM

## 2023-12-19 NOTE — Telephone Encounter (Signed)
 Called pt daughter to let her know MD [place] recommendations pt daughter voiced her understanding and was given the number to centralized scheduling to make an appt for US  renal

## 2023-12-19 NOTE — Telephone Encounter (Signed)
-----   Message from Donnice Brooks sent at 12/19/2023  9:24 AM EDT ----- Kidney function looks better. Patient needs to follow-up with me for cystoscopy as planned. He does NOT need a void trial. Continue the foley. Check a RENAL US  at [facility] in next 1-2 weeks to  ensure hydro is resolving. Diagnosis: hydronephrosis. Thanks.  ----- Message ----- From: Sammie Exie HERO, CMA Sent: 12/19/2023   8:07 AM EDT To: Donnice Brooks, MD  Please review, possible duplicate appt 11/17 ----- Message ----- From: Rebecka Memos Lab Results In Sent: 12/18/2023   5:38 AM EDT To: [facility]

## 2023-12-24 ENCOUNTER — Ambulatory Visit

## 2023-12-25 DIAGNOSIS — D631 Anemia in chronic kidney disease: Secondary | ICD-10-CM | POA: Diagnosis not present

## 2023-12-25 DIAGNOSIS — R809 Proteinuria, unspecified: Secondary | ICD-10-CM | POA: Diagnosis not present

## 2023-12-25 DIAGNOSIS — N1832 Chronic kidney disease, stage 3b: Secondary | ICD-10-CM | POA: Diagnosis not present

## 2023-12-31 ENCOUNTER — Ambulatory Visit

## 2024-01-02 ENCOUNTER — Telehealth: Payer: Self-pay

## 2024-01-02 DIAGNOSIS — N184 Chronic kidney disease, stage 4 (severe): Secondary | ICD-10-CM | POA: Diagnosis not present

## 2024-01-02 DIAGNOSIS — R809 Proteinuria, unspecified: Secondary | ICD-10-CM | POA: Diagnosis not present

## 2024-01-02 DIAGNOSIS — N178 Other acute kidney failure: Secondary | ICD-10-CM | POA: Diagnosis not present

## 2024-01-02 DIAGNOSIS — D638 Anemia in other chronic diseases classified elsewhere: Secondary | ICD-10-CM | POA: Diagnosis not present

## 2024-01-02 NOTE — Telephone Encounter (Signed)
 Phylis - daughter left a voice message.  Patient's catheter bag and tube around penis is leaking.  Has an appointment with kidney doctor next door at 1:30 today.  Asking to come by close to that time.  Please advise.  Call:  [phone] - Phylis

## 2024-01-02 NOTE — Telephone Encounter (Signed)
 Called pt daughter and let her know she could pick up some cath supplies and when she arrives and we can over how to change the bag as well catheter education was printed and will be given to pt daughter as well

## 2024-01-05 ENCOUNTER — Ambulatory Visit (HOSPITAL_COMMUNITY)
Admission: RE | Admit: 2024-01-05 | Discharge: 2024-01-05 | Disposition: A | Discharge status: Home / Self Care | Source: Ambulatory Visit | Attending: Urology | Admitting: Urology

## 2024-01-05 DIAGNOSIS — N1339 Other hydronephrosis: Secondary | ICD-10-CM | POA: Diagnosis not present

## 2024-01-05 DIAGNOSIS — N281 Cyst of kidney, acquired: Secondary | ICD-10-CM | POA: Diagnosis not present

## 2024-01-05 DIAGNOSIS — N1330 Unspecified hydronephrosis: Secondary | ICD-10-CM | POA: Diagnosis not present

## 2024-01-06 ENCOUNTER — Ambulatory Visit: Payer: Self-pay

## 2024-01-06 NOTE — Telephone Encounter (Signed)
-----   Message from Donnice Brooks sent at 01/06/2024  9:20 AM EST ----- Notify daughter jerryl looks better on the right, about the same on the left. How is her dad? Is the catheter draining. Follow-up with me for cystoscopy on 11/17 as planned. Thanks. I need to look in  his bladder and we can use the cystoscopy as a void trial.  ----- Message ----- From: Sammie Exie HERO, CMA Sent: 01/06/2024   7:56 AM EST To: Donnice Brooks, MD  Please review. ----- Message ----- From: Interface, Rad Results In Sent: 01/05/2024   8:33 PM EST To: Exie HERO Sammie, CMA

## 2024-01-06 NOTE — Telephone Encounter (Signed)
 Called pt to give pt daughter results per MD Nieves, pt daughter states per MD Bhutani protein in urine is high, kidney function is a little better should be about 10% better at next appt, pt is anemic, pt is eating well however is tired and weak pt daughter states pt is drinking more water but believes he could drink more, when she empties cath bag typically + in the morning and night pt daughter did confirm pt is taking tamsulosin   as he should pt daughter really concerned with the longevity  of the catheter and wants to know what they will do if VT does not work pt daughter states that dad normally takes care of himself but is having a harder time pt daughter advised that they can discuss that at pt next OV on the 17 pt daughter also states she potentially wants to switch providers to a MD is who is in office on a more consistent basis pt daughter notified that could be discussed at next OV pt daughter voiced her understanding

## 2024-01-08 ENCOUNTER — Telehealth: Payer: Self-pay

## 2024-01-08 DIAGNOSIS — N401 Enlarged prostate with lower urinary tract symptoms: Secondary | ICD-10-CM

## 2024-01-09 ENCOUNTER — Ambulatory Visit (HOSPITAL_COMMUNITY)
Admission: RE | Admit: 2024-01-09 | Discharge: 2024-01-09 | Disposition: A | Discharge status: Home / Self Care | Source: Ambulatory Visit | Attending: Nephrology | Admitting: Nephrology

## 2024-01-09 ENCOUNTER — Other Ambulatory Visit (HOSPITAL_COMMUNITY): Payer: Self-pay | Admitting: Nephrology

## 2024-01-09 ENCOUNTER — Other Ambulatory Visit: Payer: Self-pay

## 2024-01-09 ENCOUNTER — Telehealth: Payer: Self-pay

## 2024-01-09 ENCOUNTER — Ambulatory Visit: Admitting: Podiatry

## 2024-01-09 ENCOUNTER — Other Ambulatory Visit (HOSPITAL_COMMUNITY)
Admission: RE | Admit: 2024-01-09 | Discharge: 2024-01-09 | Disposition: A | Discharge status: Home / Self Care | Source: Ambulatory Visit | Attending: Nephrology | Admitting: Nephrology

## 2024-01-09 DIAGNOSIS — E1122 Type 2 diabetes mellitus with diabetic chronic kidney disease: Secondary | ICD-10-CM | POA: Diagnosis not present

## 2024-01-09 DIAGNOSIS — R809 Proteinuria, unspecified: Secondary | ICD-10-CM

## 2024-01-09 DIAGNOSIS — N1832 Chronic kidney disease, stage 3b: Secondary | ICD-10-CM | POA: Diagnosis not present

## 2024-01-09 DIAGNOSIS — N1330 Unspecified hydronephrosis: Secondary | ICD-10-CM | POA: Diagnosis not present

## 2024-01-09 LAB — URINALYSIS, W/ REFLEX TO CULTURE (INFECTION SUSPECTED)
Bacteria, UA: NONE SEEN
Bilirubin Urine: NEGATIVE
Glucose, UA: NEGATIVE mg/dL
Ketones, ur: NEGATIVE mg/dL
Nitrite: POSITIVE — AB
Protein, ur: 100 mg/dL — AB
RBC / HPF: >50 RBC/hpf (ref 0–5)
Specific Gravity, Urine: 1.015 (ref 1.005–1.030)
WBC, UA: >50 WBC/hpf (ref 0–5)
pH: 6 (ref 5.0–8.0)

## 2024-01-09 NOTE — Telephone Encounter (Signed)
 Refill request sent to provider.

## 2024-01-09 NOTE — Telephone Encounter (Signed)
 Pt daughter notified of MD recommendations and voiced her understanding

## 2024-01-09 NOTE — Telephone Encounter (Signed)
 Pt daughter called due to pt having passed a long worm like blood clot and no has brown stuff around catheter pt daughter was avised per verbal from MD McKenzie pt more than likely has had a bladder spasm and passed a clot pt daughter was advised and notified typically once blood dries it turns brown and if he is not passing anymore blood he should be fine pt daughter states pt is headed to MD [facility] office for an appointment and will also give advisement from them

## 2024-01-09 NOTE — Telephone Encounter (Signed)
-----   Message from Donnice Brooks sent at 01/06/2024 10:48 AM EST ----- If he can't urinate when he follows up, the catheter will have to be replaced.  If we find anything in the bladder on cystoscopy, he might need to go to the operating room if he is strong enough to  even have surgery (such as a bladder biopsy or stent placement).  If not, we will just monitor.  If he is having that much trouble, she needs to go ahead and see his primary care physician about  potential placement in a nursing facility that can care for him or get home health. ----- Message ----- From: Sammie Exie HERO, CMA Sent: 01/06/2024  10:36 AM EST To: Donnice Brooks, MD  ----- Message from Exie HERO Sammie, CMA sent at 01/06/2024 10:36 AM EST -----  Please advise if needed  ----- Message ----- From: Brooks Donnice, MD Sent: 01/06/2024   9:23 AM EST To: Exie HERO Sammie, CMA  Notify daughter Angel Norman looks better on the right, about the same on the left. How is her dad? Is the catheter draining. Follow-up with me for cystoscopy on 11/17 as planned. Thanks. I need to look in  his bladder and we can use the cystoscopy as a void trial.  ----- Message ----- From: Sammie Exie HERO, CMA Sent: 01/06/2024   7:56 AM EST To: Donnice Brooks, MD  Please review. ----- Message ----- From: Interface, Rad Results In Sent: 01/05/2024   8:33 PM EST To: Exie HERO Sammie, CMA

## 2024-01-09 NOTE — Telephone Encounter (Signed)
 Patient is having red blood in catheter tube.  Having burning and back pain  No other symptoms.  Please advise.  Call:  [phone]  [place] - daughter

## 2024-01-12 ENCOUNTER — Other Ambulatory Visit: Payer: Self-pay

## 2024-01-12 LAB — URINE CULTURE: Culture: >=100000 — AB

## 2024-01-12 NOTE — Telephone Encounter (Signed)
 Pt Daughter called and state's pt urine is dark brown dirty color. Pt's daughter is made aware that this could be an indication that the patient is possibly dehydrated  Pt daughter is advised to have pt increase fluids to about 7-8 bottles a day. Pt's daughter state's pt went to his nephrologist on Friday and was told pt has a UTI and prescribe Cipro, awaiting urine culture . Pt's daughter is made aware having a catheter is common for UTI. Daughter is made aware to encourage pt to increase his water intake to about 7-8 bottles of water a day. Pt's daughter voiced understanding.

## 2024-01-14 NOTE — Progress Notes (Unsigned)
 01/19/2024 9:58 AM   Angel Norman 07-29-1932 [id number]  Referring provider: Loreli Kins, MD [address] [place],  [place] [address]  No chief complaint on file.   HPI:  F/u -   Follow up-   1) BPH - followed for years by Dr. Matilda in [place]. On tamsulosin  and finasteride . More incontinence. Urgency. Has a weak stream. On tolterodine recently which hasn't helped. He has dry mouth. He also took abx with no change in incontinence. He stopped Jardiance . PVR 274 ml. No constipation. No NG risk. He is a good water drinker. In Oct 2024 we stopped tolterodine and started finasteride  due to inc LUTS, incontinence and higher PVR. He has a urinal or pot he voids in at night. Daughter not sure if he is missing or has incontinence. He wont wear adult briefs.    Underwent Oct 2025 CT with bilateral hydro down to a distended bladder with inferior BWT, so foley was placed. Failed VT Oct 2025.   2) hydronephrosis - Admitted 11/20/2023 - cr 3.3 up from 0.88. Assoc with N/V. Sep 2025 renal US  - mild-to-mod left-sided hydronephrosis. The right renal pelvis is also fluid-filled and dilated without hydronephrosis. On the day of discharge, creatinine remained elevated at 3.2 suggesting progressive CKD versus AKI. Excellent urine output reported (no foley). PVR 150 mL. F/u Oct 2025 CT - Bilateral hydroureteronephrosis to the level of the urinary bladder. Trabeculation and diverticula of the urinary bladder wall. Inferior bladder wall thickening versus prostate median lobe hypertrophy. Foley was placed. Cr dropped to 2.69. Failed VT. F/u Nov 2025 renal US  revealed resolution of right hydro, but stable moderate left hydronephrosis.    3) bacteriuria - long standing for many years. A 2016 CT was benign with mild BPH.    4) PSA elevation - PSA was 6.5 in 2009 and 4.05 in 2010. A 2016 CT was benign. Oct 2025 CT without LAD or bone lesion but inferior BWT.    Today, seen for the above - for  cystoscopy and management of hydronephrosis, urinary retention, bacteruria and gross haematuria. On tamsulosin  BID and finasteride . Daughter called and reported red urine and a clot. Resolved. Urine cx 01/09/2024 grew > 100K enterococcus. Sens to amp, NF, vanc. Treated with Cipro.  He is well today.  They noticed some pink urine but overall the urines been clear.  No clots.  Cystoscopy today revealed a nodular growth over the left trigone.  Prostate with obstruction from lateral lobes but may be more of a high bladder neck.   No OAC. He was maintenance at schools. 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   PMH: Past Medical History:  Diagnosis Date   Arthritis    BPH (benign prostatic hyperplasia)    GERD (gastroesophageal reflux disease)    Hyperlipidemia    Renal disorder    Rib fractures 12/06/2014    Surgical History: Past Surgical History:  Procedure Laterality Date   ESOPHAGOGASTRODUODENOSCOPY (EGD) WITH PROPOFOL  N/A 05/08/2018   Procedure: ESOPHAGOGASTRODUODENOSCOPY (EGD) WITH PROPOFOL ;  Surgeon: Golda Claudis PENNER, MD;  Location: [facility];  Service: Endoscopy;  Laterality: N/A;   goiter removal     INCISION AND DRAINAGE Left 04/24/2021   Procedure: tenosynovectomy, left ankle joint aspiration;  Surgeon: Margrette Taft BRAVO, MD;  Location: [facility] ORS;  Service: Orthopedics;  Laterality: Left;   ORIF ANKLE FRACTURE Left 09/13/2020   Procedure: OPEN REDUCTION INTERNAL FIXATION (ORIF) LEFT ANKLE FRACTURE;  Surgeon:  Margrette Taft BRAVO, MD;  Location: [facility] ORS;  Service: Orthopedics;  Laterality: Left;   TOTAL KNEE ARTHROPLASTY Left 08/18/2012   Dr Harden   TOTAL KNEE ARTHROPLASTY Left 08/19/2012   Procedure: TOTAL KNEE ARTHROPLASTY;  Surgeon: Jerona LULLA Harden, MD;  [facility];  Service: Orthopedics;  Laterality: Left;  Left Total Knee Arthroplasty    Home Medications:  Allergies as of 01/19/2024   No Known Allergies      Medication List         Accurate as of January 14, 2024  9:58 AM. If you have any questions, ask your nurse or doctor.          acetaminophen  325 MG tablet Commonly known as: TYLENOL  Take 2 tablets (650 mg total) by mouth every 6 (six) hours.   albuterol  108 (90 Base) MCG/ACT inhaler Commonly known as: VENTOLIN  HFA Inhale 1 puff into the lungs in the morning and at bedtime.   cyanocobalamin  1000 MCG tablet Take 1 tablet (1,000 mcg total) by mouth daily.   ferrous sulfate  325 (65 FE) MG EC tablet Take 325 mg by mouth daily with breakfast.   finasteride  5 MG tablet Commonly known as: PROSCAR  Take 1 tablet (5 mg total) by mouth daily.   hydrocortisone  2.5 % rectal cream Commonly known as: ANUSOL -HC Place 1 Application rectally 2 (two) times daily.   multivitamin tablet Take 1 tablet by mouth daily.   pantoprazole  40 MG tablet Commonly known as: PROTONIX  Take 1 tablet (40 mg total) by mouth daily.   polyethylene glycol 17 g packet Commonly known as: MIRALAX  / GLYCOLAX  Take 17 g by mouth daily as needed for mild constipation.   pravastatin  80 MG tablet Commonly known as: PRAVACHOL  Take 80 mg by mouth daily.   Rybelsus 7 MG Tabs Generic drug: Semaglutide Take 7 mg by mouth daily.   tamsulosin  0.4 MG Caps capsule Commonly known as: FLOMAX  Take 1 capsule (0.4 mg total) by mouth in the morning and at bedtime.        Allergies: No Known Allergies  Family History: Family History  Family history unknown: Yes    Social History:  reports that he has never smoked. He has never used smokeless tobacco. He reports that he does not drink alcohol and does not use drugs.   Procedure/exam:  PE: No respiratory distress   Abd soft, NT, ND Normal phallus with bilateral descended testicles, glans and meatus appeared normal  Cystoscopy Procedure Note  Patient identification was confirmed, informed consent was obtained, and patient was prepped using Betadine  solution.  Lidocaine  jelly was  administered per urethral meatus.     Pre-Procedure: - Inspection reveals a normal caliber ureteral meatus.  Procedure: The flexible cystoscope was introduced without difficulty - No urethral strictures/lesions are present. - Obstructing prostate with lateral lobes, high bladder neck - Elevated bladder neck - Right ureteral orifices identified, over the left ureteral orifice trigone area that appears to be a nodular growth. - Bladder mucosa  reveals no other ulcers, tumors, or lesions - No bladder stones - No trabeculation  Retroflexion shows nodular growth over left trigone otherwise normal bladder neck and no IPP.   Post-Procedure: - Patient tolerated the procedure well.  He was filled to 300 cc.   Laboratory Data: Lab Results  Component Value Date   WBC 7.9 12/11/2023   HGB 10.0 (L) 12/11/2023   HCT 32.6 (L) 12/11/2023   MCV 98.2 12/11/2023   PLT 243 12/11/2023    Lab Results  Component Value Date   CREATININE 2.69 (H) 12/17/2023    No results found for: PSA  No results found for: TESTOSTERONE  Lab Results  Component Value Date   HGBA1C 6.4 (H) 11/21/2023    Urinalysis    Component Value Date/Time   COLORURINE AMBER (A) 01/09/2024 1227   APPEARANCEUR CLOUDY (A) 01/09/2024 1227   APPEARANCEUR Cloudy (A) 12/01/2023 1109   LABSPEC 1.015 01/09/2024 1227   PHURINE 6.0 01/09/2024 1227   GLUCOSEU NEGATIVE 01/09/2024 1227   HGBUR LARGE (A) 01/09/2024 1227   BILIRUBINUR NEGATIVE 01/09/2024 1227   BILIRUBINUR Negative 12/01/2023 1109   KETONESUR NEGATIVE 01/09/2024 1227   PROTEINUR 100 (A) 01/09/2024 1227   NITRITE POSITIVE (A) 01/09/2024 1227   LEUKOCYTESUR MODERATE (A) 01/09/2024 1227    Lab Results  Component Value Date   LABMICR See below: 12/01/2023   WBCUA >30 (A) 12/01/2023   LABEPIT 0-10 12/01/2023   BACTERIA NONE SEEN 01/09/2024    Pertinent Imaging: I reviewed CT images and ultrasound images.   Results for orders placed during the  hospital encounter of 01/09/24  US  RENAL  Narrative EXAM: US  Retroperitoneum Complete, Renal.  CLINICAL HISTORY: 3 B CKD.  TECHNIQUE: Real-time ultrasound of the retroperitoneum (complete) with image documentation.  COMPARISON: US  Renal 01/05/2024.  FINDINGS:  RIGHT KIDNEY: Right Kidney measures 13.4 x 6.0 x 7.2 cm. There is a rounded hypoechoic structure in the inferior pole of the right kidney measuring 4.0 x 4.0 x 3.4 cm. This was seen on previous CT and characterized as a cyst. There is no longer any right-sided hydronephrosis. No renal stone or mass visualized (other than the cyst).  LEFT KIDNEY: Left Kidney measures 13.8 x 7.7 x 7.7 cm. Stable moderate left hydronephrosis. No renal stone or mass visualized.  BLADDER: Foley catheter decompresses the bladder.  LIMITATIONS: Examination is technically limited secondary to body habitus and technique.  IMPRESSION: 1. Stable moderate left hydronephrosis, similar to prior. 2. No right-sided hydronephrosis. 3. Rounded hypoechoic structure in the inferior pole of the right kidney measuring 4.0 cm, consistent with a cyst previously characterized on CT.  Electronically signed by: Greig Pique MD 01/09/2024 03:39 PM EST RP Workstation: [id number]   Assessment & Plan:    1) BPH-continue max medical therapy with tamsulosin  twice daily and finasteride . Void trial today not successful.  He was filled to 300 cc and could not void.  Exie initially could not get a catheter to pass but an 71 French coud catheter passed.  Urine was clear.    2) bladder neoplasm -  I drew the patient and his daughter a picture on the video board and we went over the anatomy, nature risk benefits and alternatives to cystoscopy, TURP/TURBT which would address the prostate and the bladder mass.  We discussed the bladder mass could be prostate in origin or bladder in origin and could be benign or malignant.  We also discussed left ureteral stent and  Foley catheter placement.  All questions answered.  Will look to do that in [place] if feasible as they are scheduling a ways out in [place], but discussed sometimes prior authorization or logistical issues prevent it.   3) gross hematuria-likely related to above.  Discussed with patient and daughter.  4) left hydronephrosis-likely related to above.  Discussed with patient and daughter.  Also daughter expressed her concern that she got a message from me that said the patient needed to go to a nursing home.  I discussed no, the message was to  be evaluated by his primary care doctor for weakness and the potential need for skilled nursing or HH.  We discussed it seems like the message I got was he was weaker than he actually is. Discussed we simply want to keep him safe as he has less reserve than he had when he was younger.   No follow-ups on file.  Donnice Brooks, MD  [facility]  [address] [place], [place] [address] [phone]

## 2024-01-16 ENCOUNTER — Ambulatory Visit

## 2024-01-19 ENCOUNTER — Encounter: Payer: Self-pay | Admitting: Urology

## 2024-01-19 ENCOUNTER — Ambulatory Visit: Admitting: Urology

## 2024-01-19 VITALS — BP 145/81 | HR 84

## 2024-01-19 DIAGNOSIS — N401 Enlarged prostate with lower urinary tract symptoms: Secondary | ICD-10-CM

## 2024-01-19 DIAGNOSIS — N1330 Unspecified hydronephrosis: Secondary | ICD-10-CM | POA: Diagnosis not present

## 2024-01-19 DIAGNOSIS — R310 Gross hematuria: Secondary | ICD-10-CM

## 2024-01-19 DIAGNOSIS — D414 Neoplasm of uncertain behavior of bladder: Secondary | ICD-10-CM

## 2024-01-19 DIAGNOSIS — R338 Other retention of urine: Secondary | ICD-10-CM

## 2024-01-19 MED ORDER — CIPROFLOXACIN HCL 500 MG PO TABS
500.0000 mg | ORAL_TABLET | Freq: Once | ORAL | Status: AC
  Administered 2024-01-19: 500 mg via ORAL

## 2024-01-19 NOTE — Progress Notes (Unsigned)
 Catheter Removal  Patient is present today for a catheter removal per MD order  10 ml of water was drained from the balloon. A 16FR foley cath was removed from the bladder, no complications were noted. Patient tolerated well.  Performed by: Exie DASEN. CMA  Follow up/ Additional notes:    Simple Catheter Placement  Due to urinary retention patient is present today for a foley cath placement.  Patient was cleaned and prepped in a sterile fashion with Betadinex3 . A 18 coude FR foley catheter was inserted, urine return was noted  , urine was Bloody in color.  The balloon was filled with 10cc of sterile water.  A bed bag was attached for drainage. Patient was also given a night bag to take home and was given instruction on how to change from one bag to another.  Patient was given instruction on proper catheter care.  Patient tolerated well, no complications were noted   Performed by: Exie DASEN. CMA  Additional notes/ Follow up: as scheduled

## 2024-01-21 ENCOUNTER — Encounter (HOSPITAL_COMMUNITY): Payer: Self-pay | Admitting: Urology

## 2024-01-21 ENCOUNTER — Other Ambulatory Visit: Payer: Self-pay | Admitting: Urology

## 2024-01-21 ENCOUNTER — Other Ambulatory Visit: Payer: Self-pay

## 2024-01-21 NOTE — Progress Notes (Signed)
 Attempted to obtain medical history via telephone, unable to reach at this time. HIPAA compliant voicemail message left requesting return call to pre surgical testing department.

## 2024-01-21 NOTE — Progress Notes (Addendum)
 Date of COVID positive in last 90 days:  No  PCP - Joen Gentry, MD (notes requested) Cardiologist - N/A  Chest x-ray - N/A EKG - 12-12-23 Epic Stress Test - N/A ECHO - 11-21-23 Epic Cardiac Cath - N/A Pacemaker/ICD device last checked:N/A Spinal Cord Stimulator:N/A  Bowel Prep - N/A  Sleep Study - N/A CPAP -   Fasting Blood Sugar - does not check  Checks Blood Sugar _____ times a day  Last dose of GLP1 agonist-  N/A GLP1 instructions:  Do not take after     Last dose of SGLT-2 inhibitors-  N/A SGLT-2 instructions:  Do not take after    Blood Thinner Instructions: N/A : Aspirin  Instructions:N/A Last Dose:  Activity level:  Unable to climb a flight of stairs due to weakness and not very active.  Able to perform activities of daily living with assistance.  Lives with daughter  Anesthesia review: Aflutter on recent EKG, LBBB  Patient denies shortness of breath, fever, cough and chest pain at PAT appointment (completed over the phone with daughter Angel Norman)  Patient verbalized understanding of instructions that were given to them at the PAT appointment. Patient was also instructed that they will need to review over the PAT instructions again at home before surgery.

## 2024-01-22 ENCOUNTER — Encounter: Payer: Self-pay | Admitting: Podiatry

## 2024-01-22 ENCOUNTER — Ambulatory Visit (INDEPENDENT_AMBULATORY_CARE_PROVIDER_SITE_OTHER): Admitting: Podiatry

## 2024-01-22 ENCOUNTER — Encounter (HOSPITAL_COMMUNITY): Payer: Self-pay | Admitting: Urology

## 2024-01-22 DIAGNOSIS — B351 Tinea unguium: Secondary | ICD-10-CM | POA: Diagnosis not present

## 2024-01-22 DIAGNOSIS — M79672 Pain in left foot: Secondary | ICD-10-CM

## 2024-01-22 DIAGNOSIS — M79671 Pain in right foot: Secondary | ICD-10-CM | POA: Diagnosis not present

## 2024-01-22 NOTE — Progress Notes (Signed)
 Case: [id number] Date/Time: 01/23/24 0915   Procedures:      TURP (TRANSURETHRAL RESECTION OF PROSTATE)     TURBT (TRANSURETHRAL RESECTION OF BLADDER TUMOR)     CYSTOSCOPY, FLEXIBLE, WITH STENT REPLACEMENT (Left)   Anesthesia type: General   Diagnosis:      Enlarged prostate with urinary retention [N40.1, R33.8]     Bladder neoplasm [D49.4]   Pre-op diagnosis: BENIGN PROSTATIC HYPERPLASIA WITH URINARY RETENTION AND BLADDER TUMOR   Location: [facility] ROOM 01 / [facility]   Surgeons: Angel Cough, MD       DISCUSSION: Angel Norman is an 89 yo male with PMH of HFpEF, LBBB, GERD, DM (A1c 6.4), CKD3-4, arthritis, anemia.  Patient admitted from 9/18-9/20 for syncopal episode with AKI on CKD after donating blood. EKG in the ED showed no acute changes. Echo obtained showed normal LVEF, Grade I DD, no significant valve disease. Renal US  obtained showed mild-to-mod left-sided hydronephrosis. On the day of discharge, creatinine remained elevated at 3.2 suggesting progressive CKD versus AKI.  He was referred to Nephrology and Urology.  Seen in the ED on 10/9 for constipation with abdominal pain, N/V. Fecal disimpaction performed  Pt had indwelling foley placed for bladder outlet obstruction on 10/10.   Patient has established care with Nephrology and per viewable notes kidney function is improving. Creatinine 2.69 on 10/15. Labs ordered for DOS.  EKG reviewed from 10/9 ED visit. Initial EKG read showed possible A.flutter however most likely due to artifact. ED MD read EKG as no significant change with chronic LBBB  Discussed with Angel Norman - ok to proceed.  VS: Ht 6' 3 (1.905 m)   Wt 131.5 kg   BMI 36.25 kg/m   PROVIDERS: Angel Kins, MD    CT Abd/Pelvis 12/08/23:  IMPRESSION: 1. Bilateral hydroureteronephrosis to the level of the urinary bladder. Trabeculation and diverticula of the urinary bladder wall. Inferior bladder wall thickening versus prostate median lobe hypertrophy. Findings  are suggestive of chronic bladder outlet obstruction. Consider further evaluation with cystoscopy. 2. Age indeterminate superior endplate compression deformity at L3. Correlate with point tenderness to assess acuity. 3. Calcified pleural plaques and peripheral parenchymal calcifications, suggestive of prior asbestos exposure. 4. Sigmoid colonic diverticulosis without evidence of acute diverticulitis. 5. Aortic atherosclerosis.  EKG 12/11/23:  NSR LBBB  Echo 11/21/23:  IMPRESSIONS    1. Abnormal septal motion . Left ventricular ejection fraction, by estimation, is 50 to 55%. The left ventricle has low normal function. The left ventricle has no regional wall motion abnormalities. There is moderate left ventricular hypertrophy. Left ventricular diastolic parameters are consistent with Grade I diastolic dysfunction (impaired relaxation).  2. Right ventricular systolic function is normal. The right ventricular size is normal.  3. Left atrial size was mildly dilated.  4. The mitral valve is abnormal. No evidence of mitral valve regurgitation. No evidence of mitral stenosis.  5. The aortic valve is tricuspid. There is mild calcification of the aortic valve. There is mild thickening of the aortic valve. Aortic valve regurgitation is mild. Aortic valve sclerosis is present, with no evidence of aortic valve stenosis.  6. Aortic dilatation noted. There is mild dilatation of the ascending aorta, measuring 40 mm.  7. The inferior vena cava is normal in size with greater than 50% respiratory variability, suggesting right atrial pressure of 3 mmHg.  Past Medical History:  Diagnosis Date   Anemia    Arthritis    BPH (benign prostatic hyperplasia)    Diabetes mellitus without complication (HCC)  GERD (gastroesophageal reflux disease)    Hyperlipidemia    LBBB (left bundle branch block)    Renal disorder    Rib fractures 12/06/2014    Past Surgical History:  Procedure Laterality  Date   ESOPHAGOGASTRODUODENOSCOPY (EGD) WITH PROPOFOL  N/A 05/08/2018   Procedure: ESOPHAGOGASTRODUODENOSCOPY (EGD) WITH PROPOFOL ;  Surgeon: Angel Claudis PENNER, MD;  Location[facility];  Service: Endoscopy;  Laterality: N/A;   EYE SURGERY     goiter removal     INCISION AND DRAINAGE Left 04/24/2021   Procedure: tenosynovectomy, left ankle joint aspiration;  Surgeon: Angel Taft BRAVO, MD;  Location: [facility];  Service: Orthopedics;  Laterality: Left;   ORIF ANKLE FRACTURE Left 09/13/2020   Procedure: OPEN REDUCTION INTERNAL FIXATION (ORIF) LEFT ANKLE FRACTURE;  Surgeon: Angel Taft BRAVO, MD;  Location: [facility];  Service: Orthopedics;  Laterality: Left;   TOTAL KNEE ARTHROPLASTY Left 08/18/2012   Dr Angel Norman   TOTAL KNEE ARTHROPLASTY Left 08/19/2012   Procedure: TOTAL KNEE ARTHROPLASTY;  Surgeon: Angel LULLA Harden, MD;  Location: [facility] OR;  Service: Orthopedics;  Laterality: Left;  Left Total Knee Arthroplasty    MEDICATIONS: No current facility-administered medications for this encounter.    acetaminophen  (TYLENOL ) 325 MG tablet   albuterol  (VENTOLIN  HFA) 108 (90 Base) MCG/ACT inhaler   finasteride  (PROSCAR ) 5 MG tablet   polyethylene glycol (MIRALAX  / GLYCOLAX ) 17 g packet   tamsulosin  (FLOMAX ) 0.4 MG CAPS capsule   terbinafine (LAMISIL) 1 % cream   cyanocobalamin  1000 MCG tablet   ferrous sulfate  325 (65 FE) MG EC tablet   pantoprazole  (PROTONIX ) 40 MG tablet   pravastatin  (PRAVACHOL ) 80 MG tablet   RYBELSUS 7 MG TABS   Angel Norman [facility] Short Stay/Anesthesiology [facility] Phone [phone] 01/22/2024 10:46 AM

## 2024-01-22 NOTE — Anesthesia Preprocedure Evaluation (Addendum)
 Anesthesia Evaluation  Patient identified by MRN, date of birth, ID band Patient awake    Reviewed: Allergy & Precautions, NPO status , Patient's Chart, lab work & pertinent test results  Airway Mallampati: II  TM Distance: >3 FB Neck ROM: Full    Dental no notable dental hx. (+) Upper Dentures, Lower Dentures   Pulmonary neg pulmonary ROS   Pulmonary exam normal        Cardiovascular + dysrhythmias  Rhythm:Regular Rate:Normal  IMPRESSIONS    1. Abnormal septal motion . Left ventricular ejection fraction, by estimation, is 50 to 55%. The left ventricle has low normal function. The left ventricle has no regional wall motion abnormalities. There is moderate left ventricular hypertrophy. Left ventricular diastolic parameters are consistent with Grade I diastolic dysfunction (impaired relaxation).  2. Right ventricular systolic function is normal. The right ventricular size is normal.  3. Left atrial size was mildly dilated.  4. The mitral valve is abnormal. No evidence of mitral valve regurgitation. No evidence of mitral stenosis.  5. The aortic valve is tricuspid. There is mild calcification of the aortic valve. There is mild thickening of the aortic valve. Aortic valve regurgitation is mild. Aortic valve sclerosis is present, with no evidence of aortic valve stenosis.  6. Aortic dilatation noted. There is mild dilatation of the ascending aorta, measuring 40 mm.  7. The inferior vena cava is normal in size with greater than 50% respiratory variability, suggesting right atrial pressure of 3 mmHg.    Neuro/Psych negative neurological ROS  negative psych ROS   GI/Hepatic Neg liver ROS,GERD  Medicated,,  Endo/Other  diabetes, Type 2    Renal/GU CRFRenal disease  negative genitourinary   Musculoskeletal  (+) Arthritis , Osteoarthritis,    Abdominal Normal abdominal exam  (+)   Peds  Hematology  (+) Blood dyscrasia, anemia Lab  Results      Component                Value               Date                      WBC                      7.5                 01/23/2024                HGB                      11.7 (L)            01/23/2024                HCT                      38.1 (L)            01/23/2024                MCV                      100.5 (H)           01/23/2024                PLT  203                 01/23/2024              Anesthesia Other Findings   Reproductive/Obstetrics                              Anesthesia Physical Anesthesia Plan  ASA: 3  Anesthesia Plan: General   Post-op Pain Management:    Induction: Intravenous  PONV Risk Score and Plan: 2 and Ondansetron , Dexamethasone  and Treatment may vary due to age or medical condition  Airway Management Planned: Mask and Oral ETT  Additional Equipment: None  Intra-op Plan:   Post-operative Plan: Extubation in OR  Informed Consent: I have reviewed the patients History and Physical, chart, labs and discussed the procedure including the risks, benefits and alternatives for the proposed anesthesia with the patient or authorized representative who has indicated his/her understanding and acceptance.     Dental advisory given  Plan Discussed with: CRNA  Anesthesia Plan Comments: (See PAT note from 11/20)         Anesthesia Quick Evaluation

## 2024-01-22 NOTE — Progress Notes (Signed)
 Patient presents for evaluation and treatment of tenderness and some redness around nails feet.  Tenderness around toes with walking and wearing shoes.  Complain of burning and numbness in feet.  His feet feel hot all the time.  Is a very sensitive to things.  Physical exam:  General appearance: Alert, pleasant, and in no acute distress.  Vascular: Pedal pulses: DP 2/4 B/L, PT 0/4 B/L. Severe edema lower legs bilaterally.  Capillary refill time immediate bilaterally  Neurologic: Achilles tendon reflex diminished bilaterally.  Vibratory sensation diminished feet bilaterally.  Monofilament sensation intact toes 1 through 5 bilaterally  Dermatologic:  Nails thickened, disfigured, discolored 1-5 BL with subungual debris.  Redness and hypertrophic nail folds along nail folds bilaterally but no signs of drainage or infection.  Musculoskeletal:     Diagnosis: 1. Painful onychomycotic nails 1 through 5 bilaterally. 2. Pain toes 1 through 5 bilaterally.  Plan: -Debrided onychomycotic nails 1 through 5 bilaterally.  Sharply debrided nails with nail clipper and reduced with a power bur.  -He is probably developing some diabetic neuropathy.  Is probably causing the burning and numbness in the feet.  He is having surgery soon for  the prostate and they have been taking him off his meds.  When we see him in 3 months we can talk more about the neuropathy and possibly get him started on gabapentin .  Return 3 months [facility]

## 2024-01-23 ENCOUNTER — Ambulatory Visit (HOSPITAL_COMMUNITY)

## 2024-01-23 ENCOUNTER — Encounter (HOSPITAL_COMMUNITY)
Admission: RE | Disposition: A | Discharge status: Home / Self Care | Payer: Self-pay | Source: Home / Self Care | Attending: Urology

## 2024-01-23 ENCOUNTER — Emergency Department (HOSPITAL_BASED_OUTPATIENT_CLINIC_OR_DEPARTMENT_OTHER)
Admission: EM | Admit: 2024-01-23 | Discharge: 2024-01-23 | Disposition: A | Discharge status: Home / Self Care | Source: Ambulatory Visit | Attending: Emergency Medicine | Admitting: Emergency Medicine

## 2024-01-23 ENCOUNTER — Emergency Department (HOSPITAL_BASED_OUTPATIENT_CLINIC_OR_DEPARTMENT_OTHER)

## 2024-01-23 ENCOUNTER — Encounter (HOSPITAL_COMMUNITY): Payer: Self-pay | Admitting: Physician Assistant

## 2024-01-23 ENCOUNTER — Ambulatory Visit (HOSPITAL_COMMUNITY): Payer: Self-pay | Admitting: Physician Assistant

## 2024-01-23 ENCOUNTER — Encounter (HOSPITAL_BASED_OUTPATIENT_CLINIC_OR_DEPARTMENT_OTHER): Payer: Self-pay

## 2024-01-23 ENCOUNTER — Ambulatory Visit (HOSPITAL_COMMUNITY)
Admission: RE | Admit: 2024-01-23 | Discharge: 2024-01-23 | Disposition: A | Discharge status: Home / Self Care | Attending: Urology | Admitting: Urology

## 2024-01-23 ENCOUNTER — Other Ambulatory Visit: Payer: Self-pay

## 2024-01-23 ENCOUNTER — Encounter (HOSPITAL_COMMUNITY): Payer: Self-pay | Admitting: Urology

## 2024-01-23 DIAGNOSIS — N183 Chronic kidney disease, stage 3 unspecified: Secondary | ICD-10-CM | POA: Diagnosis not present

## 2024-01-23 DIAGNOSIS — C61 Malignant neoplasm of prostate: Secondary | ICD-10-CM | POA: Diagnosis not present

## 2024-01-23 DIAGNOSIS — E119 Type 2 diabetes mellitus without complications: Secondary | ICD-10-CM | POA: Insufficient documentation

## 2024-01-23 DIAGNOSIS — I447 Left bundle-branch block, unspecified: Secondary | ICD-10-CM | POA: Insufficient documentation

## 2024-01-23 DIAGNOSIS — M8580 Other specified disorders of bone density and structure, unspecified site: Secondary | ICD-10-CM | POA: Diagnosis not present

## 2024-01-23 DIAGNOSIS — D638 Anemia in other chronic diseases classified elsewhere: Secondary | ICD-10-CM | POA: Diagnosis not present

## 2024-01-23 DIAGNOSIS — N189 Chronic kidney disease, unspecified: Secondary | ICD-10-CM | POA: Insufficient documentation

## 2024-01-23 DIAGNOSIS — E1122 Type 2 diabetes mellitus with diabetic chronic kidney disease: Secondary | ICD-10-CM

## 2024-01-23 DIAGNOSIS — M160 Bilateral primary osteoarthritis of hip: Secondary | ICD-10-CM

## 2024-01-23 DIAGNOSIS — N184 Chronic kidney disease, stage 4 (severe): Secondary | ICD-10-CM | POA: Diagnosis not present

## 2024-01-23 DIAGNOSIS — R338 Other retention of urine: Secondary | ICD-10-CM | POA: Insufficient documentation

## 2024-01-23 DIAGNOSIS — I5032 Chronic diastolic (congestive) heart failure: Secondary | ICD-10-CM | POA: Diagnosis not present

## 2024-01-23 DIAGNOSIS — N401 Enlarged prostate with lower urinary tract symptoms: Secondary | ICD-10-CM

## 2024-01-23 DIAGNOSIS — Z79899 Other long term (current) drug therapy: Secondary | ICD-10-CM | POA: Insufficient documentation

## 2024-01-23 DIAGNOSIS — R339 Retention of urine, unspecified: Secondary | ICD-10-CM

## 2024-01-23 DIAGNOSIS — D649 Anemia, unspecified: Secondary | ICD-10-CM

## 2024-01-23 DIAGNOSIS — M25551 Pain in right hip: Secondary | ICD-10-CM

## 2024-01-23 DIAGNOSIS — D494 Neoplasm of unspecified behavior of bladder: Secondary | ICD-10-CM | POA: Diagnosis not present

## 2024-01-23 DIAGNOSIS — D414 Neoplasm of uncertain behavior of bladder: Secondary | ICD-10-CM

## 2024-01-23 DIAGNOSIS — M25559 Pain in unspecified hip: Secondary | ICD-10-CM | POA: Diagnosis not present

## 2024-01-23 DIAGNOSIS — K219 Gastro-esophageal reflux disease without esophagitis: Secondary | ICD-10-CM | POA: Diagnosis not present

## 2024-01-23 DIAGNOSIS — N1330 Unspecified hydronephrosis: Secondary | ICD-10-CM | POA: Diagnosis not present

## 2024-01-23 DIAGNOSIS — M1611 Unilateral primary osteoarthritis, right hip: Secondary | ICD-10-CM | POA: Diagnosis not present

## 2024-01-23 HISTORY — PX: CYSTOSCOPY W/ URETERAL STENT PLACEMENT: SHX1429

## 2024-01-23 HISTORY — DX: Anemia, unspecified: D64.9

## 2024-01-23 HISTORY — PX: TRANSURETHRAL RESECTION OF PROSTATE: SHX73

## 2024-01-23 HISTORY — DX: Left bundle-branch block, unspecified: I44.7

## 2024-01-23 HISTORY — PX: TRANSURETHRAL RESECTION OF BLADDER TUMOR: SHX2575

## 2024-01-23 HISTORY — DX: Type 2 diabetes mellitus without complications: E11.9

## 2024-01-23 LAB — CBC
HCT: 38.1 % — ABNORMAL LOW (ref 39.0–52.0)
Hemoglobin: 11.7 g/dL — ABNORMAL LOW (ref 13.0–17.0)
MCH: 30.9 pg (ref 26.0–34.0)
MCHC: 30.7 g/dL (ref 30.0–36.0)
MCV: 100.5 fL — ABNORMAL HIGH (ref 80.0–100.0)
Platelets: 203 K/uL (ref 150–400)
RBC: 3.79 MIL/uL — ABNORMAL LOW (ref 4.22–5.81)
RDW: 15.4 % (ref 11.5–15.5)
WBC: 7.5 K/uL (ref 4.0–10.5)
nRBC: 0 % (ref 0.0–0.2)

## 2024-01-23 LAB — BASIC METABOLIC PANEL WITH GFR
Anion gap: 11 (ref 5–15)
BUN: 36 mg/dL — ABNORMAL HIGH (ref 8–23)
CO2: 20 mmol/L — ABNORMAL LOW (ref 22–32)
Calcium: 9.4 mg/dL (ref 8.9–10.3)
Chloride: 105 mmol/L (ref 98–111)
Creatinine, Ser: 2.46 mg/dL — ABNORMAL HIGH (ref 0.61–1.24)
GFR, Estimated: 25 mL/min — ABNORMAL LOW (ref >60)
Glucose, Bld: 100 mg/dL — ABNORMAL HIGH (ref 70–99)
Potassium: 4.8 mmol/L (ref 3.5–5.1)
Sodium: 136 mmol/L (ref 135–145)

## 2024-01-23 LAB — GLUCOSE, CAPILLARY
Glucose-Capillary: 94 mg/dL (ref 70–99)
Glucose-Capillary: 100 mg/dL — ABNORMAL HIGH (ref 70–99)

## 2024-01-23 LAB — HEMOGLOBIN A1C
Hgb A1c MFr Bld: 5.5 % (ref 4.8–5.6)
Mean Plasma Glucose: 111.15 mg/dL

## 2024-01-23 SURGERY — TURP (TRANSURETHRAL RESECTION OF PROSTATE)
Anesthesia: General | Site: Bladder

## 2024-01-23 MED ORDER — ROCURONIUM BROMIDE 100 MG/10ML IV SOLN
INTRAVENOUS | Status: DC | PRN
  Administered 2024-01-23: 50 mg via INTRAVENOUS

## 2024-01-23 MED ORDER — FENTANYL CITRATE (PF) 100 MCG/2ML IJ SOLN
INTRAMUSCULAR | Status: DC | PRN
  Administered 2024-01-23 (×4): 50 ug via INTRAVENOUS

## 2024-01-23 MED ORDER — EPHEDRINE SULFATE (PRESSORS) 25 MG/5ML IV SOSY
PREFILLED_SYRINGE | INTRAVENOUS | Status: DC | PRN
  Administered 2024-01-23: 5 mg via INTRAVENOUS

## 2024-01-23 MED ORDER — SODIUM CHLORIDE 0.9 % IV SOLN: 1.5000 g | INTRAVENOUS | Status: DC

## 2024-01-23 MED ORDER — FENTANYL CITRATE (PF) 50 MCG/ML IJ SOSY: 25.0000 ug | PREFILLED_SYRINGE | INTRAMUSCULAR | Status: DC | PRN

## 2024-01-23 MED ORDER — ACETAMINOPHEN 10 MG/ML IV SOLN: 1000.0000 mg | Freq: Once | INTRAVENOUS | Status: DC | PRN

## 2024-01-23 MED ORDER — PHENYLEPHRINE HCL-NACL 20-0.9 MG/250ML-% IV SOLN
INTRAVENOUS | Status: DC | PRN
  Administered 2024-01-23: 50 ug/min via INTRAVENOUS

## 2024-01-23 MED ORDER — ESMOLOL HCL 100 MG/10ML IV SOLN
INTRAVENOUS | Status: DC | PRN
  Administered 2024-01-23 (×2): 50 mg via INTRAVENOUS

## 2024-01-23 MED ORDER — DROPERIDOL 2.5 MG/ML IJ SOLN: 0.6250 mg | Freq: Once | INTRAMUSCULAR | Status: DC | PRN

## 2024-01-23 MED ORDER — INSULIN ASPART 100 UNIT/ML IJ SOLN: 0.0000 [IU] | INTRAMUSCULAR | Status: DC | PRN

## 2024-01-23 MED ORDER — LIDOCAINE HCL (PF) 2 % IJ SOLN
INTRAMUSCULAR | Status: AC
  Filled 2024-01-23: qty 5

## 2024-01-23 MED ORDER — LACTATED RINGERS IV SOLN: INTRAVENOUS | Status: DC

## 2024-01-23 MED ORDER — PHENYLEPHRINE 80 MCG/ML (10ML) SYRINGE FOR IV PUSH (FOR BLOOD PRESSURE SUPPORT)
PREFILLED_SYRINGE | INTRAVENOUS | Status: AC
  Filled 2024-01-23: qty 10

## 2024-01-23 MED ORDER — SODIUM CHLORIDE 0.9 % IV SOLN
3.0000 g | INTRAVENOUS | Status: AC
  Administered 2024-01-23: 3 g via INTRAVENOUS
  Filled 2024-01-23: qty 8

## 2024-01-23 MED ORDER — METOPROLOL TARTRATE 5 MG/5ML IV SOLN
INTRAVENOUS | Status: DC | PRN
  Administered 2024-01-23: 3 mg via INTRAVENOUS

## 2024-01-23 MED ORDER — SUGAMMADEX SODIUM 200 MG/2ML IV SOLN
INTRAVENOUS | Status: DC | PRN
  Administered 2024-01-23: 200 mg via INTRAVENOUS

## 2024-01-23 MED ORDER — SUGAMMADEX SODIUM 200 MG/2ML IV SOLN
INTRAVENOUS | Status: AC
  Filled 2024-01-23: qty 2

## 2024-01-23 MED ORDER — ORAL CARE MOUTH RINSE: 15.0000 mL | Freq: Once | OROMUCOSAL | Status: AC

## 2024-01-23 MED ORDER — PHENYLEPHRINE 80 MCG/ML (10ML) SYRINGE FOR IV PUSH (FOR BLOOD PRESSURE SUPPORT)
PREFILLED_SYRINGE | INTRAVENOUS | Status: DC | PRN
  Administered 2024-01-23: 160 ug via INTRAVENOUS
  Administered 2024-01-23: 80 ug via INTRAVENOUS

## 2024-01-23 MED ORDER — ROCURONIUM BROMIDE 10 MG/ML (PF) SYRINGE
PREFILLED_SYRINGE | INTRAVENOUS | Status: AC
  Filled 2024-01-23: qty 10

## 2024-01-23 MED ORDER — MIDAZOLAM HCL 2 MG/2ML IJ SOLN
INTRAMUSCULAR | Status: AC
  Filled 2024-01-23: qty 2

## 2024-01-23 MED ORDER — LIDOCAINE HCL (CARDIAC) PF 100 MG/5ML IV SOSY
PREFILLED_SYRINGE | INTRAVENOUS | Status: DC | PRN
  Administered 2024-01-23: 80 mg via INTRAVENOUS

## 2024-01-23 MED ORDER — IPRATROPIUM-ALBUTEROL 0.5-2.5 (3) MG/3ML IN SOLN
RESPIRATORY_TRACT | Status: AC
  Filled 2024-01-23: qty 3

## 2024-01-23 MED ORDER — METOPROLOL TARTRATE 5 MG/5ML IV SOLN
INTRAVENOUS | Status: AC
  Filled 2024-01-23: qty 5

## 2024-01-23 MED ORDER — OXYCODONE-ACETAMINOPHEN 5-325 MG PO TABS
1.0000 | ORAL_TABLET | Freq: Four times a day (QID) | ORAL | 0 refills | Status: AC | PRN
Start: 2024-01-23 — End: ?

## 2024-01-23 MED ORDER — EPHEDRINE 5 MG/ML INJ
INTRAVENOUS | Status: AC
  Filled 2024-01-23: qty 5

## 2024-01-23 MED ORDER — ONDANSETRON HCL 4 MG/2ML IJ SOLN
INTRAMUSCULAR | Status: AC
  Filled 2024-01-23: qty 2

## 2024-01-23 MED ORDER — PROPOFOL 10 MG/ML IV BOLUS
INTRAVENOUS | Status: AC
  Filled 2024-01-23: qty 20

## 2024-01-23 MED ORDER — IPRATROPIUM-ALBUTEROL 0.5-2.5 (3) MG/3ML IN SOLN
3.0000 mL | Freq: Once | RESPIRATORY_TRACT | Status: AC
  Administered 2024-01-23: 3 mL via RESPIRATORY_TRACT

## 2024-01-23 MED ORDER — MORPHINE SULFATE (PF) 4 MG/ML IV SOLN
4.0000 mg | Freq: Once | INTRAVENOUS | Status: AC
  Administered 2024-01-23: 4 mg via INTRAVENOUS
  Filled 2024-01-23: qty 1

## 2024-01-23 MED ORDER — FENTANYL CITRATE (PF) 100 MCG/2ML IJ SOLN
INTRAMUSCULAR | Status: AC
  Filled 2024-01-23: qty 2

## 2024-01-23 MED ORDER — SODIUM CHLORIDE 0.9 % IR SOLN
Status: DC | PRN
  Administered 2024-01-23 (×9): 3000 mL via INTRAVESICAL

## 2024-01-23 MED ORDER — IOHEXOL 300 MG/ML  SOLN
INTRAMUSCULAR | Status: DC | PRN
  Administered 2024-01-23: 10 mL

## 2024-01-23 MED ORDER — DEXAMETHASONE SOD PHOSPHATE PF 10 MG/ML IJ SOLN
INTRAMUSCULAR | Status: DC | PRN
  Administered 2024-01-23: 8 mg via INTRAVENOUS

## 2024-01-23 MED ORDER — TRANEXAMIC ACID-NACL 1000-0.7 MG/100ML-% IV SOLN
1000.0000 mg | INTRAVENOUS | Status: AC
  Administered 2024-01-23: 1000 mg via INTRAVENOUS
  Filled 2024-01-23: qty 100

## 2024-01-23 MED ORDER — PROPOFOL 10 MG/ML IV BOLUS
INTRAVENOUS | Status: DC | PRN
  Administered 2024-01-23: 50 mg via INTRAVENOUS
  Administered 2024-01-23: 120 mg via INTRAVENOUS

## 2024-01-23 MED ORDER — CHLORHEXIDINE GLUCONATE 0.12 % MT SOLN
15.0000 mL | Freq: Once | OROMUCOSAL | Status: AC
  Administered 2024-01-23: 15 mL via OROMUCOSAL

## 2024-01-23 MED ORDER — ONDANSETRON HCL 4 MG/2ML IJ SOLN
INTRAMUSCULAR | Status: DC | PRN
  Administered 2024-01-23: 4 mg via INTRAVENOUS

## 2024-01-23 SURGICAL SUPPLY — 27 items
BAG URINE DRAIN 2000ML AR STRL (UROLOGICAL SUPPLIES) ×2 IMPLANT
BAG URO CATCHER STRL LF (MISCELLANEOUS) ×2 IMPLANT
BAND RUBBER 3X1/6 STRL (MISCELLANEOUS) IMPLANT
BASKET ZERO TIP NITINOL 2.4FR (BASKET) IMPLANT
CATH HEMATURIA 20FR (CATHETERS) IMPLANT
CATH URETL OPEN END 6FR 70 (CATHETERS) IMPLANT
CATH UROLOGY TORQUE 40 (MISCELLANEOUS) IMPLANT
CLOTH BEACON ORANGE TIMEOUT ST (SAFETY) ×2 IMPLANT
CNTNR URN SCR LID CUP LEK RST (MISCELLANEOUS) IMPLANT
DRAPE FOOT SWITCH (DRAPES) ×2 IMPLANT
EVACUATOR MICROVAS BLADDER (UROLOGICAL SUPPLIES) ×2 IMPLANT
GLOVE SURG LX STRL 7.5 STRW (GLOVE) ×2 IMPLANT
GOWN STRL REUS W/ TWL XL LVL3 (GOWN DISPOSABLE) ×2 IMPLANT
GUIDEWIRE ANG ZIPWIRE 038X150 (WIRE) IMPLANT
GUIDEWIRE STR DUAL SENSOR (WIRE) ×2 IMPLANT
HOLDER FOLEY CATH W/STRAP (MISCELLANEOUS) IMPLANT
KIT TURNOVER KIT A (KITS) ×2 IMPLANT
LOOP CUT BIPOLAR 24F LRG (ELECTROSURGICAL) IMPLANT
MANIFOLD NEPTUNE II (INSTRUMENTS) ×2 IMPLANT
PACK CYSTO (CUSTOM PROCEDURE TRAY) ×2 IMPLANT
PAD PREP 24X48 CUFFED NSTRL (MISCELLANEOUS) ×2 IMPLANT
PIN SAFETY STERILE (MISCELLANEOUS) IMPLANT
STENT URET 6FRX28 CONTOUR (STENTS) IMPLANT
SYR 30ML LL (SYRINGE) IMPLANT
SYRINGE TOOMEY IRRIG 70ML (MISCELLANEOUS) ×2 IMPLANT
TUBING CONNECTING 10 (TUBING) ×2 IMPLANT
TUBING UROLOGY SET (TUBING) ×2 IMPLANT

## 2024-01-23 NOTE — ED Provider Notes (Signed)
 [place] EMERGENCY DEPARTMENT AT [facility] Provider Note   CSN: [id number] Arrival date & time: 01/23/24  1714     Patient presents with: Post-op Problem   Angel Norman is a 89 y.o. male with past medical history significant for GERD, CKD, obesity, diabetes, hyperlipidemia who presents with concern for right leg/hip pain, difficulty bearing weight.  He had a trans urethral resection of prostate this morning at [facility] long.  No perioperative complications, extubated without difficulty.  Foley catheter still in place, denies any dysuria.  No recent fall.  Patient has not taken anything for pain.  No history of any previous injury to the affected right hip.   HPI     Prior to Admission medications   Medication Sig Start Date End Date Taking? Authorizing Provider  oxyCODONE -acetaminophen  (PERCOCET/ROXICET) 5-325 MG tablet Take 1 tablet by mouth every 6 (six) hours as needed for severe pain (pain score 7-10). 01/23/24  Yes Trysten Berti H, PA-C  acetaminophen  (TYLENOL ) 325 MG tablet Take 2 tablets (650 mg total) by mouth every 6 (six) hours. 09/15/20   Johnson, Clanford L, MD  albuterol  (VENTOLIN  HFA) 108 (90 Base) MCG/ACT inhaler Inhale 1 puff into the lungs 2 (two) times daily as needed for wheezing or shortness of breath. 04/21/23   [provider]  cyanocobalamin  1000 MCG tablet Take 1 tablet (1,000 mcg total) by mouth daily. 11/23/23   Gonfa, Taye T, MD  ferrous sulfate  325 (65 FE) MG EC tablet Take 325 mg by mouth daily with breakfast. Patient not taking: Reported on 01/22/2024    [provider]  finasteride  (PROSCAR ) 5 MG tablet Take 1 tablet (5 mg total) by mouth daily. 12/23/22   Nieves Cough, MD  pantoprazole  (PROTONIX ) 40 MG tablet Take 1 tablet (40 mg total) by mouth daily. 11/22/23   Gonfa, Taye T, MD  polyethylene glycol (MIRALAX  / GLYCOLAX ) 17 g packet Take 17 g by mouth daily as needed for mild constipation. 04/26/21   Maree, Pratik D, DO   pravastatin  (PRAVACHOL ) 80 MG tablet Take 80 mg by mouth daily. Patient not taking: Reported on 01/21/2024    [provider]  RYBELSUS 7 MG TABS Take 7 mg by mouth daily. Patient not taking: Reported on 01/21/2024 09/18/23   [provider]  tamsulosin  (FLOMAX ) 0.4 MG CAPS capsule Take 1 capsule (0.4 mg total) by mouth in the morning and at bedtime. 12/12/23   McKenzie, Belvie CROME, MD  terbinafine (LAMISIL) 1 % cream Apply 1 Application topically 2 (two) times daily as needed (foot).    [provider]    Allergies: Patient has no known allergies.    Review of Systems  All other systems reviewed and are negative.   Updated Vital Signs BP 96/74 (BP Location: Right Arm)   Pulse 95   Temp 97.6 F (36.4 C) (Oral)   Resp 17   Ht 6' 3 (1.905 m)   Wt 131.5 kg   SpO2 93%   BMI 36.24 kg/m   Physical Exam Vitals and nursing note reviewed.  Constitutional:      General: He is not in acute distress.    Appearance: Normal appearance.  HENT:     Head: Normocephalic and atraumatic.  Eyes:     General:        Right eye: No discharge.        Left eye: No discharge.  Cardiovascular:     Rate and Rhythm: Normal rate and regular rhythm.  Pulses: Normal pulses.     Heart sounds: No murmur heard.    No friction rub. No gallop.  Pulmonary:     Effort: Pulmonary effort is normal.     Breath sounds: Normal breath sounds.  Abdominal:     General: Bowel sounds are normal.     Palpations: Abdomen is soft.  Musculoskeletal:     Comments: Normal range of motion flexion, extension, abduction, adduction of the right hip with some pain on extremes of flexion, abduction.  No step-off, deformity. Decreased strength 4/5 of the affected right hip secondary to pain.  Skin:    General: Skin is warm and dry.     Capillary Refill: Capillary refill takes less than 2 seconds.  Neurological:     Mental Status: He is alert and oriented to person, place, and time.   Psychiatric:        Mood and Affect: Mood normal.        Behavior: Behavior normal.     (all labs ordered are listed, but only abnormal results are displayed) Labs Reviewed - No data to display  EKG: None  Radiology: DG Pelvis Portable Result Date: 01/23/2024 CLINICAL DATA:  Hip pain EXAM: PORTABLE PELVIS 1-2 VIEWS COMPARISON:  Pelvis x-ray 12/11/2023 FINDINGS: The bones are diffusely osteopenic. There is no acute fracture or dislocation. There are moderate degenerative changes of both hips with joint space narrowing and osteophyte formation. Left ureteral stent is present. IMPRESSION: 1. No acute fracture or dislocation. 2. Moderate degenerative changes of both hips. Electronically Signed   By: Greig Pique M.D.   On: 01/23/2024 18:06   DG C-Arm 1-60 Min-No Report Result Date: 01/23/2024 Fluoroscopy was utilized by the requesting physician.  No radiographic interpretation.     Procedures   Medications Ordered in the ED  morphine  (PF) 4 MG/ML injection 4 mg (4 mg Intravenous Given 01/23/24 1748)                                    Medical Decision Making Amount and/or Complexity of Data Reviewed Radiology: ordered.  Risk Prescription drug management.   This patient is a 89 y.o. male who presents to the ED for concern of right hip pain postoperative after TURP this morning.   Differential diagnoses prior to evaluation: Obturator nerve, sciatic nerve injury, hip dislocation, versus established problem such as arthritis with no inflammation, trochanteric bursitis, versus other  Past Medical History / Social History / Additional history: Chart reviewed. Pertinent results include: GERD, CKD, obesity, diabetes, hyperlipidemia  Physical Exam: Physical exam performed. The pertinent findings include: Normal range of motion flexion, extension, abduction, adduction of the right hip with some pain on extremes of flexion, abduction.  No step-off, deformity. Decreased strength  4/5 of the affected right hip secondary to pain.   Vital signs stable in the emergency department.  Patient is neurovascular intact, DP, PT pulses 2+ in the affected extremity.  He was able to ambulate without significant difficulty after pain control in the emergency department.  He does have a very mild limp but otherwise normal range of motion and strength of the affected extremity.  He is able to support his weight and ambulate even without assistance.  Medications / Treatment: Morphine  for pain   I independently interpreted plain film radiograph of the pelvis which shows degenerative changes in bilateral hips, consistent with arthritis.  I suspect that he inflamed his established  degenerative hip disease during his procedure, plus or minus some referred pain from his TURP, I have very low clinical suspicion for any nerve injury given no numbness, tingling, or significant weakness other than weakness secondary to pain.  Disposition: After consideration of the diagnostic results and the patients response to treatment, I feel that especially given the patient is able to stand and ambulate patient is stable for discharge with plan for close PCP / Urology follow up. Discharged with short course of PRN pain medication  emergency department workup does not suggest an emergent condition requiring admission or immediate intervention beyond what has been performed at this time. The plan is: as above. The patient is safe for discharge and has been instructed to return immediately for worsening symptoms, change in symptoms or any other concerns.    Final diagnoses:  Pain of right hip  Arthritis of both hips    ED Discharge Orders          Ordered    oxyCODONE -acetaminophen  (PERCOCET/ROXICET) 5-325 MG tablet  Every 6 hours PRN        01/23/24 1824               Rosan Sherlean DEL, PA-C 01/23/24 1832    Pamella Ozell LABOR, DO 01/27/24 1154

## 2024-01-23 NOTE — OR Nursing (Signed)
 Patients foley removed

## 2024-01-23 NOTE — ED Triage Notes (Signed)
 Pt had a TURP this AM at [facility]. Pt granddaughter reports pt having issues walking now and not putting weight on R leg/hip. Pt reports R hip pain.

## 2024-01-23 NOTE — Anesthesia Procedure Notes (Signed)
 Procedure Name: Intubation Date/Time: 01/23/2024 9:34 AM  Performed by: Nada Corean CROME, CRNAPre-anesthesia Checklist: Suction available, Emergency Drugs available, Patient identified, Patient being monitored and Timeout performed Patient Re-evaluated:Patient Re-evaluated prior to induction Oxygen Delivery Method: Circle system utilized Preoxygenation: Pre-oxygenation with 100% oxygen Induction Type: IV induction Ventilation: Mask ventilation without difficulty and Two handed mask ventilation required Laryngoscope Size: Mac and 4 Grade View: Grade I Tube type: Oral Tube size: 7.5 mm Number of attempts: 1 Airway Equipment and Method: Stylet Placement Confirmation: ETT inserted through vocal cords under direct vision, positive ETCO2 and breath sounds checked- equal and bilateral Secured at: 23 cm Tube secured with: Tape Dental Injury: Teeth and Oropharynx as per pre-operative assessment

## 2024-01-23 NOTE — H&P (Signed)
 H&P  Chief Complaint: BPH, urinary retention, bladder neoplasm, left hydronephrosis  History of Present Illness: Angel Norman is an 89 year old male with a history of BPH.  He went into urinary retention with bilateral hydronephrosis in September 2025.  CT showed bilateral hydronephrosis to the level of the bladder with trabeculation and diverticula.  There was also inferior bladder wall thickening.  A Foley catheter was placed and the right hydronephrosis eventually resolved but the left persisted.  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  He was brought today for cystoscopy with TURBT/TURP and possible left ureteral stent.  He is well.  He has had some gross hematuria but no bladder pain or fever.  Here with his daughter.  Creatinine 2.46 which is the best it has been in some time.  Hematocrit 38.  Past Medical History:  Diagnosis Date   Anemia    Arthritis    BPH (benign prostatic hyperplasia)    Diabetes mellitus without complication (HCC)    GERD (gastroesophageal reflux disease)    Hyperlipidemia    LBBB (left bundle branch block)    Renal disorder    Rib fractures 12/06/2014   Past Surgical History:  Procedure Laterality Date   ESOPHAGOGASTRODUODENOSCOPY (EGD) WITH PROPOFOL  N/A 05/08/2018   Procedure: ESOPHAGOGASTRODUODENOSCOPY (EGD) WITH PROPOFOL ;  Surgeon: Golda Claudis PENNER, MD;  Location: [facility];  Service: Endoscopy;  Laterality: N/A;   EYE SURGERY     goiter removal     INCISION AND DRAINAGE Left 04/24/2021   Procedure: tenosynovectomy, left ankle joint aspiration;  Surgeon: Margrette Taft BRAVO, MD;  Location: [facility]S;  Service: Orthopedics;  Laterality: Left;   ORIF ANKLE FRACTURE Left 09/13/2020   Procedure: OPEN REDUCTION INTERNAL FIXATION (ORIF) LEFT ANKLE FRACTURE;  Surgeon: Margrette Taft BRAVO, MD;  Location: [facility];  Service: Orthopedics;  Laterality: Left;   TOTAL KNEE ARTHROPLASTY Left  08/18/2012   Dr Harden   TOTAL KNEE ARTHROPLASTY Left 08/19/2012   Procedure: TOTAL KNEE ARTHROPLASTY;  Surgeon: Jerona LULLA Harden, MD;  Location: [facility] OR;  Service: Orthopedics;  Laterality: Left;  Left Total Knee Arthroplasty    Home Medications:  Medications Prior to Admission  Medication Sig Dispense Refill Last Dose/Taking   acetaminophen  (TYLENOL ) 325 MG tablet Take 2 tablets (650 mg total) by mouth every 6 (six) hours.   Past Week   albuterol  (VENTOLIN  HFA) 108 (90 Base) MCG/ACT inhaler Inhale 1 puff into the lungs 2 (two) times daily as needed for wheezing or shortness of breath.   Past Week   cyanocobalamin  1000 MCG tablet Take 1 tablet (1,000 mcg total) by mouth daily.   Past Week   finasteride  (PROSCAR ) 5 MG tablet Take 1 tablet (5 mg total) by mouth daily. 90 tablet 3 01/23/2024 Morning   polyethylene glycol (MIRALAX  / GLYCOLAX ) 17 g packet Take 17 g by mouth daily as needed for mild constipation. 14 each 0 Taking As Needed   tamsulosin  (FLOMAX ) 0.4 MG CAPS capsule Take 1 capsule (0.4 mg total) by mouth in the morning and at bedtime. 60 capsule 11 01/23/2024 Morning   terbinafine (LAMISIL) 1 % cream Apply 1 Application topically 2 (two) times daily as needed (foot).   Taking As Needed   ferrous sulfate  325 (65 FE) MG EC tablet Take 325 mg by mouth daily with breakfast. (Patient not taking: Reported on 01/22/2024)   Not Taking   pantoprazole  (PROTONIX ) 40 MG tablet Take 1 tablet (40 mg  total) by mouth daily.      pravastatin  (PRAVACHOL ) 80 MG tablet Take 80 mg by mouth daily. (Patient not taking: Reported on 01/22/2024)   Not Taking   RYBELSUS 7 MG TABS Take 7 mg by mouth daily. (Patient not taking: Reported on 01/21/2024)   Not Taking   Allergies: No Known Allergies  Family History  Family history unknown: Yes   Social History:  reports that he has never smoked. He has never used smokeless tobacco. He reports that he does not drink alcohol and does not use drugs.  ROS: A complete  review of systems was performed.  All systems are negative except for pertinent findings as noted. Review of Systems  All other systems reviewed and are negative. Daughter contributed - dementia    Physical Exam:  Vital signs in last 24 hours: Temp:  [97.4 F (36.3 C)] 97.4 F (36.3 C) (11/21 0748) Pulse Rate:  [86] 86 (11/21 0748) Resp:  [18] 18 (11/21 0748) BP: (144)/(79) 144/79 (11/21 0748) SpO2:  [95 %] 95 % (11/21 0748) Weight:  [131.5 kg] 131.5 kg (11/21 0809) General:  Alert and oriented, No acute distress HEENT: Normocephalic, atraumatic Cardiovascular: Regular rate and rhythm Lungs: Regular rate and effort Abdomen: Soft, nontender, nondistended, no abdominal masses Back: No CVA tenderness Extremities: No edema Neurologic: Grossly intact  Laboratory Data:  Results for orders placed or performed during the hospital encounter of 01/23/24 (from the past 24 hours)  Basic metabolic panel per protocol     Status: Abnormal   Collection Time: 01/23/24  8:00 AM  Result Value Ref Range   Sodium 136 135 - 145 mmol/L   Potassium 4.8 3.5 - 5.1 mmol/L   Chloride 105 98 - 111 mmol/L   CO2 20 (L) 22 - 32 mmol/L   Glucose, Bld 100 (H) 70 - 99 mg/dL   BUN 36 (H) 8 - 23 mg/dL   Creatinine, Ser 7.53 (H) 0.61 - 1.24 mg/dL   Calcium 9.4 8.9 - 89.6 mg/dL   GFR, Estimated 25 (L) >60 mL/min   Anion gap 11 5 - 15  CBC per protocol     Status: Abnormal   Collection Time: 01/23/24  8:00 AM  Result Value Ref Range   WBC 7.5 4.0 - 10.5 K/uL   RBC 3.79 (L) 4.22 - 5.81 MIL/uL   Hemoglobin 11.7 (L) 13.0 - 17.0 g/dL   HCT 61.8 (L) 60.9 - 47.9 %   MCV 100.5 (H) 80.0 - 100.0 fL   MCH 30.9 26.0 - 34.0 pg   MCHC 30.7 30.0 - 36.0 g/dL   RDW 84.5 88.4 - 84.4 %   Platelets 203 150 - 400 K/uL   nRBC 0.0 0.0 - 0.2 %  Glucose, capillary     Status: None   Collection Time: 01/23/24  8:10 AM  Result Value Ref Range   Glucose-Capillary 94 70 - 99 mg/dL   No results found for this or any previous  visit (from the past 240 hours). Creatinine: Recent Labs    01/23/24 0800  CREATININE 2.46*    Impression/Assessment:  BPH, urinary retention, bladder neoplasm, left hydronephrosis-  Plan:  I discussed with the patient the nature, potential benefits, risks and alternatives to cystoscopy with TURP and TURBT possible left ureteral stent, including side effects of the proposed treatment, the likelihood of the patient achieving the goals of the procedure, and any potential problems that might occur during the procedure or recuperation. All questions answered. Patient elects to proceed.  Donnice Brooks 01/23/2024, 9:05 AM

## 2024-01-23 NOTE — Op Note (Signed)
 Preoperative diagnosis: BPH, urinary retention, bladder neoplasm, left hydronephrosis Postoperative diagnosis: Same  Procedure: TURBT, TURP, left retrograde pyelogram, left ureteral stent  Surgeon: Nieves  Anesthesia: General  Indication for procedure: Angel Norman is an 89 year old male who was admitted with retention and bilateral hydronephrosis.  A Foley catheter was placed.  Follow-up imaging revealed thickening at the left bladder neck UVJ as well as continued left hydronephrosis.  Office cystoscopy revealed possible papillary tumor in the bladder.  Findings: On exam there was an entrenched and firm mass along the left bladder neck from 6:00 up to about 2:00.  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  The bladder neck was rigid and fixed.  The right ureteral orifice was visualized.  After resection the left ureteral orifice was visualized and stented.  Resection at the left bladder neck and left prostate revealed some tumor in the left prostate as well.  Left lobe of the prostate was mainly obstructive and resected.  Right lobe minimally resected.  Good channel at the end.  Get excellent hemostasis.  Left retrograde pyelogram-this outlined a dilated tortuous left ureter confirming placement of the wire in the lumen.  Description of procedure: After consent was obtained the patient was brought to the operating room.  After adequate anesthesia he is placed in lithotomy position and prepped and draped in the usual sterile fashion.  Timeout was performed to confirm the patient and procedure.  Cystoscope was passed per urethra and the bladder inspected.  Bladder neck was fixed and difficult to look around and the bladder neck was bleeding and difficult visualization.  I switched that out to the continuous-flow sheath with the visual obturator and then the loop and handle.  I began resecting at 6:00 into some fine high-grade appearing papillary tumor brought that over which involve the entire  left trigone and left bladder neck.  I was not able to quite get from 9:00 to 12:00.  There is some residual tumor there.  This was sent as bladder neck (and trigone) resection.  I then did the usual TURP.  I started on the right to help define the left lateral lobe.  I started on the right at 7:00 in line with the right ureteral orifice and brought an incision down to the verumontanum.  Similar way I connected the prior TURBT down at 5:00 and brought that incision down to the verumontanum.  I then resected the left lateral lobe.  As I got deeper in the left lobe and back toward the bladder neck papillary tumor was visualized.  All the chips were evacuated.  This created an excellent channel.  I then looked back at the trigone and thought I could see the muscle fibers wrapping around the left ureteral orifice.  I then saw a contraction.  We used the bridge and a Kumpe catheter to steer a sensor wire and but it would not advance.  We called for fluoroscopy.  We then advanced and angled zip wire.  I backed out the scope and the Kumpe catheter.  I tried to get the Kumpe then up into the collecting system for a complete retrograde but could not get the Kumpe past the distal ureter.  Leaving the Kumpe here retrograde injection of contrast was performed.  Which confirmed the ureter.  Therefore readvanced and coiled the zip wire up in the collecting system.  I then backloaded the wire on the cystoscope to get better support for the wire and the stent and then passed a 6x28 stent.  The wire was removed the good coil seen up in the kidney and a good coil in the bladder.  I then passed the continuous-flow resectoscope sheath back with the loop and handle cleared up any chips and confirmed hemostasis and a good channel in the prostate.  All this looked good.  The scope was backed out and a 20 French hematuria catheter was placed.  30 cc in the balloon it was placed on light traction.  Drainage was clear.  He was connected to  CBI for wake-up.  He was awakened and taken the cover room in stable condition.  Complications: None  Blood loss: 100 cc  Specimens to pathology: #1 bladder neck resection (includes left trigone) #2 TURP chips  Drains: 20 French Foley catheter  Disposition: Patient stable to PACU.

## 2024-01-23 NOTE — Transfer of Care (Signed)
 Immediate Anesthesia Transfer of Care Note  Patient: Angel Norman [facility]  Procedure(s) Performed: TURP (TRANSURETHRAL RESECTION OF PROSTATE) (Bladder) TURBT (TRANSURETHRAL RESECTION OF BLADDER TUMOR) (Bladder) CYSTOSCOPY, LEFT RETROGRADE PYELOGRAM WITH LEFT URETERAL STENT REPLACEMENT (Left)  Patient Location: PACU  Anesthesia Type:General  Level of Consciousness: awake, alert , oriented, and patient cooperative  Airway & Oxygen Therapy: Patient Spontanous Breathing and Patient connected to face mask oxygen  Post-op Assessment: Report given to RN and Post -op Vital signs reviewed and stable  Post vital signs: Reviewed and stable  Last Vitals:  Vitals Value Taken Time  BP 144/78 01/23/24 11:25  Temp    Pulse 82 01/23/24 11:27  Resp 18 01/23/24 11:27  SpO2 100 % 01/23/24 11:27  Vitals shown include unfiled device data.  Last Pain:  Vitals:   01/23/24 0809  TempSrc:   PainSc: 0-No pain         Complications: No notable events documented.

## 2024-01-23 NOTE — Discharge Instructions (Addendum)
 Your x-ray showed some degenerative, arthritis changes of bilateral hips.  I suspect that they inflamed your right hip while placing you in the stirrups for your procedure, there is no evidence of dislocation.  I do not see any evidence of nerve dysfunction or injury as a result of the procedure.  You can use the pain medicine as needed as below.  Please use Tylenol  for pain.  You may use 1000 mg of Tylenol  every 6 hours.  Not to exceed 4 g of Tylenol  within 24 hours.  You can use the stronger narcotic pain medication in place of Tylenol  for severe break through pain.  If you take the narcotic pain medication that we prescribed recommend that you also take a laxative such as MiraLAX  or Dulcolax every day that you take the narcotic pain medicine, and drink plenty of fluids, 50 to 64 ounces to prevent any constipation.

## 2024-01-23 NOTE — Anesthesia Postprocedure Evaluation (Signed)
 Anesthesia Post Note  Patient: Angel Norman [facility]  Procedure(s) Performed: TURP (TRANSURETHRAL RESECTION OF PROSTATE) (Bladder) TURBT (TRANSURETHRAL RESECTION OF BLADDER TUMOR) (Bladder) CYSTOSCOPY, LEFT RETROGRADE PYELOGRAM WITH LEFT URETERAL STENT REPLACEMENT (Left)     Patient location during evaluation: PACU Anesthesia Type: General Level of consciousness: awake and alert Pain management: pain level controlled Vital Signs Assessment: post-procedure vital signs reviewed and stable Respiratory status: spontaneous breathing, nonlabored ventilation, respiratory function stable and patient connected to nasal cannula oxygen Cardiovascular status: blood pressure returned to baseline and stable Postop Assessment: no apparent nausea or vomiting Anesthetic complications: no   No notable events documented.  Last Vitals:  Vitals:   01/23/24 1314 01/23/24 1342  BP: 113/70 111/60  Pulse: 80 72  Resp: 15 18  Temp:  (!) 36.1 C  SpO2: 95% 97%    Last Pain:  Vitals:   01/23/24 1342  TempSrc: Oral  PainSc: 0-No pain                 Angel Norman

## 2024-01-24 ENCOUNTER — Encounter (HOSPITAL_COMMUNITY): Payer: Self-pay | Admitting: Urology

## 2024-01-25 ENCOUNTER — Emergency Department (HOSPITAL_BASED_OUTPATIENT_CLINIC_OR_DEPARTMENT_OTHER)
Admission: EM | Admit: 2024-01-25 | Discharge: 2024-01-25 | Disposition: A | Discharge status: Home / Self Care | Attending: Emergency Medicine | Admitting: Emergency Medicine

## 2024-01-25 DIAGNOSIS — T83091A Other mechanical complication of indwelling urethral catheter, initial encounter: Secondary | ICD-10-CM | POA: Diagnosis not present

## 2024-01-25 DIAGNOSIS — Y732 Prosthetic and other implants, materials and accessory gastroenterology and urology devices associated with adverse incidents: Secondary | ICD-10-CM | POA: Diagnosis not present

## 2024-01-25 LAB — CBC WITH DIFFERENTIAL/PLATELET
Abs Immature Granulocytes: 0.05 K/uL (ref 0.00–0.07)
Basophils Absolute: 0.1 K/uL (ref 0.0–0.1)
Basophils Relative: 1 %
Eosinophils Absolute: 0.1 K/uL (ref 0.0–0.5)
Eosinophils Relative: 1 %
HCT: 38.4 % — ABNORMAL LOW (ref 39.0–52.0)
Hemoglobin: 11.3 g/dL — ABNORMAL LOW (ref 13.0–17.0)
Immature Granulocytes: 1 %
Lymphocytes Relative: 11 %
Lymphs Abs: 1.1 K/uL (ref 0.7–4.0)
MCH: 30.5 pg (ref 26.0–34.0)
MCHC: 29.4 g/dL — ABNORMAL LOW (ref 30.0–36.0)
MCV: 103.8 fL — ABNORMAL HIGH (ref 80.0–100.0)
Monocytes Absolute: 0.6 K/uL (ref 0.1–1.0)
Monocytes Relative: 6 %
Neutro Abs: 8.1 K/uL — ABNORMAL HIGH (ref 1.7–7.7)
Neutrophils Relative %: 80 %
Platelets: 177 K/uL (ref 150–400)
RBC: 3.7 MIL/uL — ABNORMAL LOW (ref 4.22–5.81)
RDW: 15.5 % (ref 11.5–15.5)
WBC: 10 K/uL (ref 4.0–10.5)
nRBC: 0 % (ref 0.0–0.2)

## 2024-01-25 LAB — BASIC METABOLIC PANEL WITH GFR
Anion gap: 11 (ref 5–15)
BUN: 47 mg/dL — ABNORMAL HIGH (ref 8–23)
CO2: 21 mmol/L — ABNORMAL LOW (ref 22–32)
Calcium: 8.9 mg/dL (ref 8.9–10.3)
Chloride: 105 mmol/L (ref 98–111)
Creatinine, Ser: 2.66 mg/dL — ABNORMAL HIGH (ref 0.61–1.24)
GFR, Estimated: 22 mL/min — ABNORMAL LOW (ref >60)
Glucose, Bld: 98 mg/dL (ref 70–99)
Potassium: 4.7 mmol/L (ref 3.5–5.1)
Sodium: 137 mmol/L (ref 135–145)

## 2024-01-25 MED ORDER — LIDOCAINE HCL URETHRAL/MUCOSAL 2 % EX GEL
1.0000 | Freq: Once | CUTANEOUS | Status: AC
  Administered 2024-01-25: 1 via URETHRAL
  Filled 2024-01-25: qty 11

## 2024-01-25 MED ORDER — MIRABEGRON ER 25 MG PO TB24: 25.0000 mg | ORAL_TABLET | Freq: Every day | ORAL | 0 refills | Status: AC

## 2024-01-25 NOTE — ED Provider Notes (Signed)
 [place] EMERGENCY DEPARTMENT AT [facility]  Provider Note  CSN: [id number] Arrival date & time: 01/25/24 0012  History Chief Complaint  Patient presents with   Post-op Problem    Angel Norman is a 89 y.o. male brought by wife for foley complication. He had a TURP done yesterday and had been doing well with foley but in the last few hours it has stopped draining and now leaking urine from around the catheter. No pain. No fever.    Home Medications Prior to Admission medications   Medication Sig Start Date End Date Taking? Authorizing Provider  acetaminophen  (TYLENOL ) 325 MG tablet Take 2 tablets (650 mg total) by mouth every 6 (six) hours. 09/15/20   Johnson, Clanford L, MD  albuterol  (VENTOLIN  HFA) 108 (90 Base) MCG/ACT inhaler Inhale 1 puff into the lungs 2 (two) times daily as needed for wheezing or shortness of breath. 04/21/23   [provider]  cyanocobalamin  1000 MCG tablet Take 1 tablet (1,000 mcg total) by mouth daily. 11/23/23   Gonfa, Taye T, MD  ferrous sulfate  325 (65 FE) MG EC tablet Take 325 mg by mouth daily with breakfast. Patient not taking: Reported on 01/22/2024    [provider]  finasteride  (PROSCAR ) 5 MG tablet Take 1 tablet (5 mg total) by mouth daily. 12/23/22   Nieves Cough, MD  oxyCODONE -acetaminophen  (PERCOCET/ROXICET) 5-325 MG tablet Take 1 tablet by mouth every 6 (six) hours as needed for severe pain (pain score 7-10). 01/23/24   Prosperi, Christian H, PA-C  pantoprazole  (PROTONIX ) 40 MG tablet Take 1 tablet (40 mg total) by mouth daily. 11/22/23   Gonfa, Taye T, MD  polyethylene glycol (MIRALAX  / GLYCOLAX ) 17 g packet Take 17 g by mouth daily as needed for mild constipation. 04/26/21   Maree, Pratik D, DO  pravastatin  (PRAVACHOL ) 80 MG tablet Take 80 mg by mouth daily. Patient not taking: Reported on 01/21/2024    [provider]  RYBELSUS 7 MG TABS Take 7 mg by mouth daily. Patient not taking: Reported on  01/21/2024 09/18/23   [provider]  tamsulosin  (FLOMAX ) 0.4 MG CAPS capsule Take 1 capsule (0.4 mg total) by mouth in the morning and at bedtime. 12/12/23   McKenzie, Belvie CROME, MD  terbinafine (LAMISIL) 1 % cream Apply 1 Application topically 2 (two) times daily as needed (foot).    [provider]     Allergies    Patient has no known allergies.   Review of Systems   Review of Systems Please see HPI for pertinent positives and negatives  Physical Exam BP (!) 152/76 (BP Location: Right Arm)   Pulse 73   Temp 98.2 F (36.8 C) (Oral)   Resp 17   Ht 6' 3 (1.905 m)   Wt 131.5 kg   SpO2 97%   BMI 36.24 kg/m   Physical Exam Vitals and nursing note reviewed.  Constitutional:      Appearance: Normal appearance.  HENT:     Head: Normocephalic and atraumatic.     Nose: Nose normal.     Mouth/Throat:     Mouth: Mucous membranes are moist.  Eyes:     Extraocular Movements: Extraocular movements intact.     Conjunctiva/sclera: Conjunctivae normal.  Cardiovascular:     Rate and Rhythm: Normal rate.  Pulmonary:     Effort: Pulmonary effort is normal.     Breath sounds: Normal breath sounds.  Abdominal:     General: Abdomen is flat.  Palpations: Abdomen is soft.     Tenderness: There is no abdominal tenderness.  Musculoskeletal:        General: No swelling. Normal range of motion.     Cervical back: Neck supple.  Skin:    General: Skin is warm and dry.     Findings: No rash (on exposed skin).  Neurological:     General: No focal deficit present.     Mental Status: He is alert and oriented to person, place, and time.  Psychiatric:        Mood and Affect: Mood normal.     ED Results / Procedures / Treatments   EKG None  Procedures Procedures  Medications Ordered in the ED Medications - No data to display  Initial Impression and Plan  Patient here with obstructed foley catheter, no blood clots per wife at bedside. Will attempt to irrigate  to see if that helps with flow.   ED Course   Clinical Course as of 01/25/24 0209  [place] Jan 25, 2024  0123 Per RN, unable to flush catheter, will try replacing.  [CS]  0153 RN unable to pass a catheter including coude on multiple attempts. Spoke with Dr. Norva, on call for Urology, who requests the patient be sent to [facility] for her to evaluate. Dr. Griselda is aware. Going POV.  [CS]    Clinical Course User Index [CS] Roselyn Carlin NOVAK, MD     MDM Rules/Calculators/A&P Medical Decision Making Problems Addressed: Obstructed Foley catheter, initial encounter: acute illness or injury     Final Clinical Impression(s) / ED Diagnoses Final diagnoses:  Obstructed Foley catheter, initial encounter    Rx / DC Orders ED Discharge Orders     None        Roselyn Carlin NOVAK, MD 01/25/24 [phone]

## 2024-01-25 NOTE — Procedures (Signed)
   Procedure: insert indwelling catheter bedside   Urology Procedure Note: The patient was prepped and draped in the usual sterile fashion. Lubricating jelly was inserted per urethra. A 0.61mm hydrophilic glidewire was inserted into the urethra until the approximate level of the bladder. A 5 Fr open-ended catheter was passed over the wire and the wire was removed. The catheter was pulled back while aspirating with a 10 cc syringe until there was return of clear yellow urine, confirming placement in the bladder. The wire was replaced and the catheter removed. An 18 Fr council tip catheter was passed over the wire into the bladder. Wire was removed. Balloon was filled with 10 cc sterile water and the catheter was connected to a drainage bag.  - Continue foley catheter until patient's scheduled appt on 12/5 for catheter removal - Discussed with pt's wife that he may have been having bladder spasms vs catheter clogging. Recommend mirebegron 25 mg daily, stop two days before catheter removal.   Maurilio Agar, MD  [facility] PGY-4

## 2024-01-25 NOTE — Progress Notes (Signed)
 Late entry 20 Fr Coude Foley catheter insertion attempted per order using sterile technique. Lidocaine  jelly instilled before insertion.  Catheter advanced without resistance; however, no urine output was noted in tubing or collection bag. Patient denied discomfort at the time of insertion. Catheter subsequently removed intact however, no urine return was noted at that time as well..Patient tolerated procedure well. Provider notified. Plan of care ongoing.

## 2024-01-25 NOTE — ED Provider Notes (Signed)
 Patient transferred from [facility] for evaluation of Foley complication.  He is status post TURP on Friday and had leakage of fluid around the catheter.  Staff at [facility] were unable to irrigate the catheter and catheter was removed with attempted to place a new catheter, which was unsuccessful.  He was transferred to the [facility] emergency department for further management.  Discussed with Dr. Norva with urology-recommends catheter team attempt to replace catheter.  Catheter team was unable to place Foley catheter, discussed with Dr. Norva, who evaluated the patient in the emergency department and placed a catheter.  Plan to discharge home on Myrbetriq  25 per urology recs with urology follow-up and return precautions.   Griselda Norris, MD 01/25/24 [phone]

## 2024-01-25 NOTE — ED Triage Notes (Signed)
 Pt to exam 10 c/o urinary cath leaking from end of penis following surgery on Friday. VSS NAD PT on room air. Pt denies dysuria pain fever chills.

## 2024-01-25 NOTE — ED Notes (Signed)
 Urine sample sent to the lab

## 2024-01-25 NOTE — ED Notes (Signed)
 Pt's catheter removed due to occlusion and leakage. Unable to replace catheter; was unable to pass cath by prostate x 2 attempts. MD made aware

## 2024-01-26 ENCOUNTER — Telehealth: Payer: Self-pay

## 2024-01-26 LAB — SURGICAL PATHOLOGY

## 2024-01-26 NOTE — Telephone Encounter (Signed)
 Tried calling pt back with no answer.. Pt's daughter called back in and state's she took pt to the ER for urinary retention with cath and bladder spasm.  Phyllis state's that pt was wet and there was no urine flowing into the pt cath and took pt to the ER twice  for two different things. [place] is made aware pt has a BX talk with provider. Pt was prescribe  Mirabegron  for bladder spasm. Pt's daughter voiced understanding

## 2024-01-26 NOTE — Telephone Encounter (Signed)
-----   Message from Donnice Brooks sent at 01/23/2024 11:26 AM EST ----- I did a bladder and prostate biopsy on Angel Norman today.  I need to see him back December 1 or 15 to discuss results.  Thank you.

## 2024-01-26 NOTE — Telephone Encounter (Signed)
 Called pt daughter to ler her know we have scheduled pt for a Bx discussion with MD [place] lvm to c/b if needed

## 2024-02-02 ENCOUNTER — Ambulatory Visit: Admitting: Urology

## 2024-02-02 VITALS — BP 128/75 | HR 87

## 2024-02-02 DIAGNOSIS — R339 Retention of urine, unspecified: Secondary | ICD-10-CM | POA: Diagnosis not present

## 2024-02-02 DIAGNOSIS — N1330 Unspecified hydronephrosis: Secondary | ICD-10-CM | POA: Diagnosis not present

## 2024-02-02 DIAGNOSIS — C61 Malignant neoplasm of prostate: Secondary | ICD-10-CM | POA: Diagnosis not present

## 2024-02-02 NOTE — Progress Notes (Unsigned)
 02/02/2024 4:11 PM   Alm JULIANNA Hua April 20, 1932 [id number]  Referring provider: Loreli Kins, MD [address] [place],  [place] [address]  No chief complaint on file.   HPI:  Follow up-  1) prostate cancer-patient was diagnosed with high risk stage IIIc prostate cancer November 2025.  Long history of PSA elevation of 6.5 in 2009 and then 4.05 in 2010. Admitted with urinary retention and bilateral hydronephrosis on ultrasound in September 2025.  Follow-up October 2025 CT revealed bilateral hydro down to a distended bladder with inferior left bladder wall thickening at the prostate.  No LAD or bone lesions.  Foley catheter was placed.  Right hydro resolved.  Left hydro persisted.  November 2025 TURP/TURBT revealed GG 5 prostate cancer and 90 to 95% of the resected specimen.  Left ureteral orifice was visualized and stented.  Biopsy: November 2025 high risk prostate cancer High Risk Prostate cancer Stage IIIc PSA ??? T4 invasion left BN, trigone GG5, Gleason 4+5=9 in 90-95% resection  Staging: October 2025 CT A/P-T4 prostate cancer with thickening at the left trigone/bladder base, left hydronephrosis, no LAD or bone lesions. November 2025-pelvic x-ray-degenerative changes, no sclerotic lesions.  2) left hydronephrosis-as above. Cr 3.3 at highest. Left bladder neck left trigone and UO involved with prostate cancer.  Left ureteral stent placed at time of TURBT/TURP November 2025. Nov 2025 Cr 2.66 after stent.   3) BPH-followed for years by Dr. Matilda in [place].  On tamsulosin  and finasteride .  Today, seen for the above with new issue.  High risk prostate cancer. S/p NOV 2025 TURP/TURBT. Went to ED hip pain and xray negative. Also cath clogged vs bladder spasm. Catheter changed.  Catheter draining well today.  They do have a follow-up with me at [facility] on Friday at 8:30 AM.   No OAC. He was maintenance at schools. 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   PMH: Past Medical History:  Diagnosis Date   Anemia    Arthritis    BPH (benign prostatic hyperplasia)    Diabetes mellitus without complication (HCC)    GERD (gastroesophageal reflux disease)    Hyperlipidemia    LBBB (left bundle branch block)    Renal disorder    Rib fractures 12/06/2014    Surgical History: Past Surgical History:  Procedure Laterality Date   CYSTOSCOPY W/ URETERAL STENT PLACEMENT Left 01/23/2024   Procedure: CYSTOSCOPY, LEFT RETROGRADE PYELOGRAM WITH LEFT URETERAL STENT REPLACEMENT;  Surgeon: Nieves Cough, MD;  Location: [facility];  Service: Urology;  Laterality: Left;   ESOPHAGOGASTRODUODENOSCOPY (EGD) WITH PROPOFOL  N/A 05/08/2018   Procedure: ESOPHAGOGASTRODUODENOSCOPY (EGD) WITH PROPOFOL ;  Surgeon: Golda Claudis PENNER, MD;  Location: [facility];  Service: Endoscopy;  Laterality: N/A;   EYE SURGERY     goiter removal     INCISION AND DRAINAGE Left 04/24/2021   Procedure: tenosynovectomy, left ankle joint aspiration;  Surgeon: Margrette Taft BRAVO, MD;  Location: [facility];  Service: Orthopedics;  Laterality: Left;   ORIF ANKLE FRACTURE Left 09/13/2020   Procedure: OPEN REDUCTION INTERNAL FIXATION (ORIF) LEFT ANKLE FRACTURE;  Surgeon: Margrette Taft BRAVO, MD;  Location: [facility];  Service: Orthopedics;  Laterality: Left;   TOTAL KNEE ARTHROPLASTY Left 08/18/2012   Dr Harden   TOTAL KNEE ARTHROPLASTY Left 08/19/2012   Procedure: TOTAL KNEE ARTHROPLASTY;  Surgeon: Jerona LULLA Harden, MD;  [facility];  Service: Orthopedics;  Laterality: Left;  Left Total Knee Arthroplasty   TRANSURETHRAL RESECTION  OF BLADDER TUMOR N/A 01/23/2024   Procedure: TURBT (TRANSURETHRAL RESECTION OF BLADDER TUMOR);  Surgeon: Nieves Cough, MD;  Location: [facility];  Service: Urology;  Laterality: N/A;   TRANSURETHRAL RESECTION OF PROSTATE N/A 01/23/2024   Procedure: TURP (TRANSURETHRAL RESECTION OF PROSTATE);  Surgeon: Nieves Cough, MD;  Location: [facility];   Service: Urology;  Laterality: N/A;    Home Medications:  Allergies as of 02/02/2024   No Known Allergies      Medication List        Accurate as of February 02, 2024  4:11 PM. If you have any questions, ask your nurse or doctor.          acetaminophen  325 MG tablet Commonly known as: TYLENOL  Take 2 tablets (650 mg total) by mouth every 6 (six) hours.   albuterol  108 (90 Base) MCG/ACT inhaler Commonly known as: VENTOLIN  HFA Inhale 1 puff into the lungs 2 (two) times daily as needed for wheezing or shortness of breath.   cyanocobalamin  1000 MCG tablet Take 1 tablet (1,000 mcg total) by mouth daily.   ferrous sulfate  325 (65 FE) MG EC tablet Take 325 mg by mouth daily with breakfast.   finasteride  5 MG tablet Commonly known as: PROSCAR  Take 1 tablet (5 mg total) by mouth daily.   mirabegron  ER 25 MG Tb24 tablet Commonly known as: Myrbetriq  Take 1 tablet (25 mg total) by mouth daily for 7 days.   oxyCODONE -acetaminophen  5-325 MG tablet Commonly known as: PERCOCET/ROXICET Take 1 tablet by mouth every 6 (six) hours as needed for severe pain (pain score 7-10).   pantoprazole  40 MG tablet Commonly known as: PROTONIX  Take 1 tablet (40 mg total) by mouth daily.   polyethylene glycol 17 g packet Commonly known as: MIRALAX  / GLYCOLAX  Take 17 g by mouth daily as needed for mild constipation.   pravastatin  80 MG tablet Commonly known as: PRAVACHOL  Take 80 mg by mouth daily.   Rybelsus 7 MG Tabs Generic drug: Semaglutide Take 7 mg by mouth daily.   tamsulosin  0.4 MG Caps capsule Commonly known as: FLOMAX  Take 1 capsule (0.4 mg total) by mouth in the morning and at bedtime.   terbinafine 1 % cream Commonly known as: LAMISIL Apply 1 Application topically 2 (two) times daily as needed (foot).        Allergies: No Known Allergies  Family History: Family History  Family history unknown: Yes    Social History:  reports that he has never smoked. He has never  used smokeless tobacco. He reports that he does not drink alcohol and does not use drugs.   Physical Exam: BP 128/75   Pulse 87   Constitutional:  Alert and oriented, No acute distress. HEENT: [place] AT, moist mucus membranes.  Trachea midline, no masses. Cardiovascular: No clubbing, cyanosis, or edema. Respiratory: Normal respiratory effort, no increased work of breathing. GI: Abdomen is soft, nontender, nondistended, no abdominal masses GU: No CVA tenderness Skin: No rashes, bruises or suspicious lesions. Neurologic: Grossly intact, no focal deficits, moving all 4 extremities. Psychiatric: Normal mood and affect.  Laboratory Data: Lab Results  Component Value Date   WBC 10.0 01/25/2024   HGB 11.3 (L) 01/25/2024   HCT 38.4 (L) 01/25/2024   MCV 103.8 (H) 01/25/2024   PLT 177 01/25/2024    Lab Results  Component Value Date   CREATININE 2.66 (H) 01/25/2024    No results found for: PSA  No results found for: TESTOSTERONE  Lab Results  Component Value Date  HGBA1C 5.5 01/23/2024    Urinalysis    Component Value Date/Time   COLORURINE AMBER (A) 01/09/2024 1227   APPEARANCEUR CLOUDY (A) 01/09/2024 1227   APPEARANCEUR Cloudy (A) 12/01/2023 1109   LABSPEC 1.015 01/09/2024 1227   PHURINE 6.0 01/09/2024 1227   GLUCOSEU NEGATIVE 01/09/2024 1227   HGBUR LARGE (A) 01/09/2024 1227   BILIRUBINUR NEGATIVE 01/09/2024 1227   BILIRUBINUR Negative 12/01/2023 1109   KETONESUR NEGATIVE 01/09/2024 1227   PROTEINUR 100 (A) 01/09/2024 1227   NITRITE POSITIVE (A) 01/09/2024 1227   LEUKOCYTESUR MODERATE (A) 01/09/2024 1227    Lab Results  Component Value Date   LABMICR See below: 12/01/2023   WBCUA >30 (A) 12/01/2023   LABEPIT 0-10 12/01/2023   BACTERIA NONE SEEN 01/09/2024    Pertinent Imaging: November 2025-pelvis x-rays imaging and report reviewed  Reviewed path report.  Assessment & Plan:    1) urinary retention-likely related to prostate cancer as the prostate and  bladder neck especially the left side were firm and fixed.  Voiding trial on Friday at [facility].   2) high risk prostate cancer-I went over his stage, grade and prognosis. Send PSA. We discussed the nature r/b of doing nothing/WW, ADT, +/- XRT +/- ARPI .  Discussed nature R/B/A to ADT inc fatigue, MSK, cardiac, among others. We used his path report as a reference as well as the prostate cancer handout.  Discussed formal staging with PET scan.  Discussed referral to radiation oncology and medical oncology.  She mentioned it be closer to go to [facility] for the PET scan.  Also in thinking about his long-term needs, they like to follow-up in [place] at [facility].  They do have a postop appointment Friday morning and [facility] they will keep.  Once staging complete, they are interested in starting ADT and ARPI and seeing how he does.  We could then consider radiation in the future.   3) hydronephrosis of left kidney/left ureteral stent-discussed will need to remove or exchange of the stent in a few months.  No follow-ups on file.  Donnice Brooks, MD  [facility]  [address] [place], [place] [address] [phone]

## 2024-02-03 DIAGNOSIS — Z23 Encounter for immunization: Secondary | ICD-10-CM | POA: Diagnosis not present

## 2024-02-03 DIAGNOSIS — N183 Chronic kidney disease, stage 3 unspecified: Secondary | ICD-10-CM | POA: Diagnosis not present

## 2024-02-03 DIAGNOSIS — I10 Essential (primary) hypertension: Secondary | ICD-10-CM | POA: Diagnosis not present

## 2024-02-03 DIAGNOSIS — C61 Malignant neoplasm of prostate: Secondary | ICD-10-CM | POA: Diagnosis not present

## 2024-02-03 DIAGNOSIS — K219 Gastro-esophageal reflux disease without esophagitis: Secondary | ICD-10-CM | POA: Diagnosis not present

## 2024-02-03 DIAGNOSIS — E1122 Type 2 diabetes mellitus with diabetic chronic kidney disease: Secondary | ICD-10-CM | POA: Diagnosis not present

## 2024-02-03 DIAGNOSIS — Z978 Presence of other specified devices: Secondary | ICD-10-CM | POA: Diagnosis not present

## 2024-02-04 ENCOUNTER — Ambulatory Visit: Payer: Self-pay

## 2024-02-04 LAB — PSA: Prostate Specific Ag, Serum: 144 ng/mL — ABNORMAL HIGH (ref 0.0–4.0)

## 2024-02-05 ENCOUNTER — Other Ambulatory Visit

## 2024-02-09 NOTE — Telephone Encounter (Signed)
-----   Message from Donnice Brooks sent at 02/06/2024 11:17 AM EST ----- Let Angel Norman's daughter know his PSA is very high which confirms prostate cancer. I was expecting him in [place] this morning for voiding trial and to start medication. Ask are they going to  follow-up long term in [place] or [place]?  ----- Message ----- From: Sammie Exie HERO, CMA Sent: 02/04/2024   8:00 AM EST To: Donnice Brooks, MD  Please review. ----- Message ----- From: Rebecka Memos Lab Results In Sent: 02/04/2024   5:38 AM EST To: [facility]

## 2024-02-09 NOTE — Telephone Encounter (Signed)
 Called and left detailed message on daughters vm okay per DPR

## 2024-02-13 ENCOUNTER — Other Ambulatory Visit: Payer: Self-pay

## 2024-02-13 DIAGNOSIS — R339 Retention of urine, unspecified: Secondary | ICD-10-CM

## 2024-02-13 DIAGNOSIS — N401 Enlarged prostate with lower urinary tract symptoms: Secondary | ICD-10-CM

## 2024-02-13 DIAGNOSIS — C61 Malignant neoplasm of prostate: Secondary | ICD-10-CM

## 2024-02-18 ENCOUNTER — Encounter (HOSPITAL_COMMUNITY): Admission: RE | Admit: 2024-02-18

## 2024-02-18 DIAGNOSIS — C61 Malignant neoplasm of prostate: Secondary | ICD-10-CM | POA: Diagnosis present

## 2024-02-18 MED ORDER — FLOTUFOLASTAT F 18 GALLIUM 296-5846 MBQ/ML IV SOLN
8.4000 | Freq: Once | INTRAVENOUS | Status: AC
  Administered 2024-02-18: 17:00:00 8.4 via INTRAVENOUS

## 2024-03-08 ENCOUNTER — Ambulatory Visit: Admitting: Urology

## 2024-04-19 ENCOUNTER — Ambulatory Visit: Admitting: Urology

## 2024-04-22 ENCOUNTER — Ambulatory Visit: Admitting: Podiatry
